# Patient Record
Sex: Female | Born: 1935 | Race: White | Hispanic: No | Smoking: Never smoker
Health system: Southern US, Community
[De-identification: ages and names within clinical notes are randomized; demographics above are authoritative.]

## PROBLEM LIST (undated history)

## (undated) DIAGNOSIS — K219 Gastro-esophageal reflux disease without esophagitis: Secondary | ICD-10-CM

## (undated) DIAGNOSIS — E7800 Pure hypercholesterolemia, unspecified: Secondary | ICD-10-CM

## (undated) DIAGNOSIS — H409 Unspecified glaucoma: Secondary | ICD-10-CM

## (undated) DIAGNOSIS — H269 Unspecified cataract: Secondary | ICD-10-CM

## (undated) DIAGNOSIS — T7840XA Allergy, unspecified, initial encounter: Secondary | ICD-10-CM

## (undated) DIAGNOSIS — M5136 Other intervertebral disc degeneration, lumbar region: Secondary | ICD-10-CM

## (undated) DIAGNOSIS — M1990 Unspecified osteoarthritis, unspecified site: Secondary | ICD-10-CM

## (undated) DIAGNOSIS — I441 Atrioventricular block, second degree: Secondary | ICD-10-CM

## (undated) DIAGNOSIS — G4700 Insomnia, unspecified: Secondary | ICD-10-CM

## (undated) DIAGNOSIS — B351 Tinea unguium: Secondary | ICD-10-CM

## (undated) DIAGNOSIS — K5730 Diverticulosis of large intestine without perforation or abscess without bleeding: Secondary | ICD-10-CM

## (undated) DIAGNOSIS — K5900 Constipation, unspecified: Secondary | ICD-10-CM

## (undated) DIAGNOSIS — G709 Myoneural disorder, unspecified: Secondary | ICD-10-CM

## (undated) DIAGNOSIS — I10 Essential (primary) hypertension: Secondary | ICD-10-CM

## (undated) DIAGNOSIS — M4800 Spinal stenosis, site unspecified: Secondary | ICD-10-CM

## (undated) DIAGNOSIS — Z9289 Personal history of other medical treatment: Secondary | ICD-10-CM

## (undated) DIAGNOSIS — M810 Age-related osteoporosis without current pathological fracture: Secondary | ICD-10-CM

## (undated) DIAGNOSIS — R0989 Other specified symptoms and signs involving the circulatory and respiratory systems: Secondary | ICD-10-CM

## (undated) DIAGNOSIS — I8390 Asymptomatic varicose veins of unspecified lower extremity: Secondary | ICD-10-CM

## (undated) DIAGNOSIS — E739 Lactose intolerance, unspecified: Secondary | ICD-10-CM

## (undated) DIAGNOSIS — E079 Disorder of thyroid, unspecified: Secondary | ICD-10-CM

## (undated) DIAGNOSIS — Z860101 Personal history of adenomatous and serrated colon polyps: Secondary | ICD-10-CM

## (undated) DIAGNOSIS — J439 Emphysema, unspecified: Secondary | ICD-10-CM

## (undated) DIAGNOSIS — F419 Anxiety disorder, unspecified: Secondary | ICD-10-CM

## (undated) DIAGNOSIS — Z86010 Personal history of colonic polyps: Secondary | ICD-10-CM

## (undated) DIAGNOSIS — M51369 Other intervertebral disc degeneration, lumbar region without mention of lumbar back pain or lower extremity pain: Secondary | ICD-10-CM

## (undated) DIAGNOSIS — E039 Hypothyroidism, unspecified: Secondary | ICD-10-CM

## (undated) HISTORY — PX: JOINT REPLACEMENT: SHX530

## (undated) HISTORY — DX: Disorder of thyroid, unspecified: E07.9

## (undated) HISTORY — DX: Emphysema, unspecified: J43.9

## (undated) HISTORY — DX: Personal history of adenomatous and serrated colon polyps: Z86.0101

## (undated) HISTORY — DX: Spinal stenosis, site unspecified: M48.00

## (undated) HISTORY — DX: Tinea unguium: B35.1

## (undated) HISTORY — PX: ABDOMINAL HYSTERECTOMY: SHX81

## (undated) HISTORY — DX: Constipation, unspecified: K59.00

## (undated) HISTORY — DX: Other intervertebral disc degeneration, lumbar region without mention of lumbar back pain or lower extremity pain: M51.369

## (undated) HISTORY — DX: Allergy, unspecified, initial encounter: T78.40XA

## (undated) HISTORY — DX: Gastro-esophageal reflux disease without esophagitis: K21.9

## (undated) HISTORY — DX: Essential (primary) hypertension: I10

## (undated) HISTORY — DX: Lactose intolerance, unspecified: E73.9

## (undated) HISTORY — PX: TOTAL HIP ARTHROPLASTY: SHX124

## (undated) HISTORY — DX: Atrioventricular block, second degree: I44.1

## (undated) HISTORY — PX: OTHER SURGICAL HISTORY: SHX169

## (undated) HISTORY — PX: INCONTINENCE SURGERY: SHX676

## (undated) HISTORY — DX: Myoneural disorder, unspecified: G70.9

## (undated) HISTORY — DX: Unspecified osteoarthritis, unspecified site: M19.90

## (undated) HISTORY — PX: CATARACT EXTRACTION W/ INTRAOCULAR LENS  IMPLANT, BILATERAL: SHX1307

## (undated) HISTORY — DX: Insomnia, unspecified: G47.00

## (undated) HISTORY — PX: CHOLECYSTECTOMY: SHX55

## (undated) HISTORY — DX: Personal history of other medical treatment: Z92.89

## (undated) HISTORY — DX: Unspecified cataract: H26.9

## (undated) HISTORY — DX: Personal history of colonic polyps: Z86.010

## (undated) HISTORY — DX: Anxiety disorder, unspecified: F41.9

## (undated) HISTORY — PX: TONSILLECTOMY: SUR1361

## (undated) HISTORY — DX: Unspecified glaucoma: H40.9

## (undated) HISTORY — DX: Other intervertebral disc degeneration, lumbar region: M51.36

## (undated) HISTORY — DX: Age-related osteoporosis without current pathological fracture: M81.0

## (undated) HISTORY — DX: Other specified symptoms and signs involving the circulatory and respiratory systems: R09.89

## (undated) HISTORY — DX: Diverticulosis of large intestine without perforation or abscess without bleeding: K57.30

## (undated) HISTORY — DX: Asymptomatic varicose veins of unspecified lower extremity: I83.90

## (undated) HISTORY — PX: BLADDER REPAIR: SHX76

---

## 1998-04-07 ENCOUNTER — Other Ambulatory Visit: Admission: RE | Admit: 1998-04-07 | Discharge: 1998-04-07 | Payer: Self-pay | Admitting: *Deleted

## 1999-05-13 ENCOUNTER — Other Ambulatory Visit: Admission: RE | Admit: 1999-05-13 | Discharge: 1999-05-13 | Payer: Self-pay | Admitting: *Deleted

## 1999-07-07 ENCOUNTER — Ambulatory Visit (HOSPITAL_COMMUNITY): Admission: RE | Admit: 1999-07-07 | Discharge: 1999-07-07 | Payer: Self-pay | Admitting: Gastroenterology

## 2000-05-31 ENCOUNTER — Encounter (INDEPENDENT_AMBULATORY_CARE_PROVIDER_SITE_OTHER): Payer: Self-pay | Admitting: Specialist

## 2000-05-31 ENCOUNTER — Other Ambulatory Visit: Admission: RE | Admit: 2000-05-31 | Discharge: 2000-05-31 | Payer: Self-pay | Admitting: Radiology

## 2000-08-22 ENCOUNTER — Emergency Department (HOSPITAL_COMMUNITY): Admission: EM | Admit: 2000-08-22 | Discharge: 2000-08-22 | Payer: Self-pay | Admitting: Emergency Medicine

## 2000-08-30 ENCOUNTER — Encounter: Admission: RE | Admit: 2000-08-30 | Discharge: 2000-08-30 | Payer: Self-pay | Admitting: Gastroenterology

## 2000-08-30 ENCOUNTER — Encounter: Payer: Self-pay | Admitting: Gastroenterology

## 2000-09-13 ENCOUNTER — Encounter: Payer: Self-pay | Admitting: General Surgery

## 2000-09-15 ENCOUNTER — Observation Stay (HOSPITAL_COMMUNITY): Admission: RE | Admit: 2000-09-15 | Discharge: 2000-09-16 | Payer: Self-pay | Admitting: General Surgery

## 2001-04-11 ENCOUNTER — Ambulatory Visit (HOSPITAL_COMMUNITY): Admission: RE | Admit: 2001-04-11 | Discharge: 2001-04-11 | Payer: Self-pay | Admitting: Gastroenterology

## 2002-03-19 ENCOUNTER — Encounter: Payer: Self-pay | Admitting: Urology

## 2002-03-23 ENCOUNTER — Inpatient Hospital Stay (HOSPITAL_COMMUNITY): Admission: RE | Admit: 2002-03-23 | Discharge: 2002-03-25 | Payer: Self-pay | Admitting: Urology

## 2003-03-30 DIAGNOSIS — R0989 Other specified symptoms and signs involving the circulatory and respiratory systems: Secondary | ICD-10-CM

## 2003-03-30 HISTORY — DX: Other specified symptoms and signs involving the circulatory and respiratory systems: R09.89

## 2003-04-16 ENCOUNTER — Encounter: Admission: RE | Admit: 2003-04-16 | Discharge: 2003-04-16 | Payer: Self-pay | Admitting: Gastroenterology

## 2003-04-16 ENCOUNTER — Encounter: Payer: Self-pay | Admitting: Gastroenterology

## 2003-05-21 ENCOUNTER — Encounter (INDEPENDENT_AMBULATORY_CARE_PROVIDER_SITE_OTHER): Payer: Self-pay | Admitting: Specialist

## 2003-05-21 ENCOUNTER — Ambulatory Visit (HOSPITAL_COMMUNITY): Admission: RE | Admit: 2003-05-21 | Discharge: 2003-05-21 | Payer: Self-pay | Admitting: Gastroenterology

## 2003-08-30 HISTORY — PX: SHOULDER ARTHROSCOPY WITH ROTATOR CUFF REPAIR: SHX5685

## 2003-09-08 ENCOUNTER — Encounter: Admission: RE | Admit: 2003-09-08 | Discharge: 2003-09-08 | Payer: Self-pay | Admitting: Orthopedic Surgery

## 2003-09-09 ENCOUNTER — Ambulatory Visit (HOSPITAL_BASED_OUTPATIENT_CLINIC_OR_DEPARTMENT_OTHER): Admission: RE | Admit: 2003-09-09 | Discharge: 2003-09-09 | Payer: Self-pay | Admitting: Orthopedic Surgery

## 2003-09-09 ENCOUNTER — Ambulatory Visit (HOSPITAL_COMMUNITY): Admission: RE | Admit: 2003-09-09 | Discharge: 2003-09-09 | Payer: Self-pay | Admitting: Orthopedic Surgery

## 2005-07-07 ENCOUNTER — Encounter: Admission: RE | Admit: 2005-07-07 | Discharge: 2005-07-07 | Payer: Self-pay | Admitting: Internal Medicine

## 2006-02-23 ENCOUNTER — Encounter: Admission: RE | Admit: 2006-02-23 | Discharge: 2006-02-23 | Payer: Self-pay | Admitting: Internal Medicine

## 2007-05-07 ENCOUNTER — Other Ambulatory Visit: Admission: RE | Admit: 2007-05-07 | Discharge: 2007-05-07 | Payer: Self-pay | Admitting: Internal Medicine

## 2010-09-18 ENCOUNTER — Encounter: Payer: Self-pay | Admitting: Internal Medicine

## 2010-12-29 ENCOUNTER — Other Ambulatory Visit (HOSPITAL_COMMUNITY): Payer: Self-pay | Admitting: Neurological Surgery

## 2010-12-29 DIAGNOSIS — M48061 Spinal stenosis, lumbar region without neurogenic claudication: Secondary | ICD-10-CM

## 2011-01-14 NOTE — Op Note (Signed)
[facility]  Patient:    Destiny [name], Destiny [name] Visit Number: [id number] MRN: [id number]          Service Type: SUR Location: 3W [id number] [id number] Attending Physician:  Londell Moh Dictated by:   Jamison Neighbor, M.D. Proc. Date: 03/22/02 Admit Date:  03/22/2002                             Operative Report  SERVICE:  Urology.  PREOPERATIVE DIAGNOSES: 1. Stress urinary incontinence. 2. Cystocele.  POSTOPERATIVE DIAGNOSES: 1. Stress urinary incontinence. 2. Cystocele.  PROCEDURE:  Transvaginal taping pubovaginal sling.  SURGEON:  Jamison Neighbor, M.D.  ANESTHESIA:  General.  COMPLICATIONS:  None.  DRAINS:  Foley catheter.  BRIEF HISTORY:  This 77 year old female has urinary incontinence felt to be primarily stress incontinence in nature but with a possible component of urge incontinence. She had been evaluated of the urodynamics which confirmed a low leak point pressure. The patient was thought to have a modest cystocele as well as a small rectocele. The rectocele was asymptomatic and the patient did not feel that she needed to have anything done for rectal symptoms. The patient does want to have the TVT procedure done. She has read about this in the lay press and now she presented to the office with the expressed interest in having this done. She was told that a sling was an optimal option for her but that she might require additional cystocele repair. She knows that if she has the cystocele she will likely have to stay and perhaps use a Cysto catheter, if she has just the TVT she should be able to go home as an outpatient. The patient gave full and informed consent.  DESCRIPTION OF PROCEDURE:  After successful induction of general anesthesia, the patient was placed in the dorsal lithotomy position and prepped with Betadine and draped in the usual sterile fashion. A vertical incision was made directly over the bladder neck. The patient had a  little bit more brisk bleeding than is normally seen and this was controlled with electrocautery. The flaps of mucosa were raised bilaterally extending back to the space of Retzius but not through the space of Retzius. The bladder was drained. The TVT needle guides were passed from the abdominal incision down to the vaginal incision. The cystoscope was inserted and the bladder was carefully inspected and was free of any tumor or stones. Both ureteral orifices were normal in configuration and location. There was no injury to the bladder. This was inspected with both 12 degree and 70 degree lenses. The TVT was then pulled up to the abdominal incision. The tension was set so that an 8 Hegar dilator would sit just underneath the urethra but no tighter. The protective wrapper for the sling was pulled away, the sling was cut at the skin level setting the tension. The incision was irrigated and closed with Vicryl sutures. The top incision was closed with 4-0 Vicryl and Steri-Strips. The patient tolerated the procedure well and was taken to the recovery room in good condition. Dictated by:   Jamison Neighbor, M.D. Attending Physician:  Londell Moh DD:  03/22/02 TD:  03/25/02 Job: [id number]

## 2011-01-14 NOTE — Op Note (Signed)
NAME:  Destiny [name], Destiny [name]                         ACCOUNT NO.:  [id number]   MEDICAL RECORD NO.:  [id number]                   PATIENT TYPE:  AMB   LOCATION:  DSC                                  FACILITY:  [facility]   PHYSICIAN:  Katy Fitch. Naaman Plummer., M.D.          DATE OF BIRTH:  1928/03/11   DATE OF PROCEDURE:  09/09/2003  DATE OF DISCHARGE:                                 OPERATIVE REPORT   PREOPERATIVE DIAGNOSES:  Large retracted three-tendon rotator cuff tear,  right shoulder, with evidence of significant synovitis and probable long  head of biceps rupture, also MRI evidence of long head of biceps rupture and  acromioclavicular arthropathy.   POSTOPERATIVE DIAGNOSES:  Large retracted three-tendon rotator cuff tear,  right shoulder, with evidence of significant synovitis and probable long  head of biceps rupture, also MRI evidence of long head of biceps rupture and  acromioclavicular arthropathy.   OPERATION:  1. Diagnostic arthroscopy, right glenohumeral joint, followed by extensive     debridement of synovitis, necrotic rotator cuff, and biceps proximal     stump, with documentation of moderate glenohumeral degenerative     arthritis.  2. Open reconstruction of three-tendon rotator cuff tear, including     subscapularis, supraspinatus, infraspinatus, with a small portion of the     teres minor.  3. Open resection of the distal clavicle.  4. Incidental subacromial decompression.   SURGEON:  Katy Fitch. Sypher, M.D.   ASSISTANT:  Marveen Reeks. Dasnoit, P.A.-C.   ANESTHESIA:  General endotracheal, supplemented by inter-Scalene block.   ANESTHESIOLOGIST:  Maren Beach, M.D.   INDICATIONS FOR PROCEDURE:  The patient is a 77 year old woman referred by  Dr. Soyla Murphy. Pharr for evaluation and management of a painful right  shoulder.  She has a history of increasing weakness of abduction, external  rotation, and pain when she sleeps on her shoulder at night.  The clinical  examination revealed signs of a probable substantial rotator cuff tear.  Plain films documented AC arthropathy, a type 2 acromion, and a high-riding  humeral head suggestive of a substantial rotator cuff tear.   INFORMED CONSENT:  After an informed consent, she is brought to the  operating room at this time, anticipating decompression, distal clavicle  resection, debridement as necessary and an open repair of her rotator cuff.   DESCRIPTION OF PROCEDURE:  The patient was brought to the operating room and  placed in the supine position upon the operating room table.  Following an  anesthesia consultation by Dr. Diamantina Monks, a Scalene block was placed,  followed by induction of general orotracheal anesthesia.  She was carefully  positioned in the beach chair position with the aid of torso and head  holders for a shoulder arthroscopy.  The entire right upper extremity and  forequarter were prepped with DuraPrep and draped with impervious  arthroscopy drapes.  The procedure commenced with the placement of the  arthroscope through  a standard posterior portal.  A diagnostic arthroscopy  revealed a massive mid-substance tear of the subscapularis, supraspinatus,  infraspinatus and a portion of the teres minor.  Photographic documentation  of the tendon rupture predicament was accomplished with a digital camera.  The long head of the biceps was ruptured with a small slip remaining within  the joint.  An anterior portal was created under direct vision, followed by  the use of a suction shaver to debride the free margins of the rotator cuff  tear, complete a synovectomy of the joint, debride the deep surface of the  teres minor and infraspinatus tear, and generally debride the glenohumeral  joint.  The arthroscopic equipment was then removed from the glenohumeral joint and  we proceeded directly to an open reconstruction of the massive tear.  An  incision was fashioned from the distal clavicle to the  anterior lateral  corner of the acromion.  The anterior third of the deltoid was taken down,  exposing the [facility] joint and the coracoacromial ligament.  The ligament was  released and the artery electrocauterized.  A massively retracted complex  degenerative rotator cuff tear was noted.  The deep surface of the  subscapularis was debrided, as was the margin of the supraspinatus,  infraspinatus and the teres minor.  The greater tuberosity was decorticated  with the power bur, followed by the placement of three 6.5 mm Biocortical  screw anchors, two to anchor the supraspinatus and infraspinatus corner, and  one to anchor the infraspinatus and teres minor.  The V-shaped split between  the supraspinatus and infraspinatus was closed with a series of baseball  sutures interrupted style of #2 fiber wire that were advanced laterally to  help secure the insertion point on the greater tuberosity.  The mattress  sutures of the Biocortical screw anchors were placed into the  subscapularis/supraspinatus junction with slight superior advancement of the  subscapularis.  The supraspinatus and infraspinatus, as well as the teres  minor were repaired to their anatomic positions at the greater tuberosity  with a total of six mattress sutures of #2 fiber wire, followed by the use  of two McLaughlin sutures of #2 fiber wire through bone to augment this  repair.  A very smooth repair with a low profile was achieved.  The anterior  acromion was leveled to a type 1 morphology with the oscillating saw,  followed by the use of a rongeur to remove the medial osteophyte at the [facility]  joint.  The wounds were subsequently irrigated with sterile saline, followed  by triple antibiotic solution.  The anterior third of the deltoid was then  repaired to the capsule of the [facility] joint, and to the anterior acromion with  mattress sutures of #2 fiber wire, followed by repair of the deltoid split with  mattress sutures of #0 Vicryl.  The  skin was repaired with subdermal  sutures of #3-0 Vicryl and intradermal #3-0 Prolene with Steri-Strips.  There were no apparent complications.  The patient tolerated the surgery and anesthesia well.  She was transferred  to the recovery room with stable vital signs.   DISPOSITION:  She will be discharged to the recovery care center for  observation of her vital signs and appropriate analgesics in the form of IV  PC  and morphine, IV and p.o. Dilaudid, as well as prophylactic antibiotic  in the form of IV Ancef 1 g q.8h.  Katy Fitch Naaman Plummer., M.D.    RVS/MEDQ  D:  09/09/2003  T:  09/09/2003  Job:  [id number]   cc:   Soyla Murphy. Renne Crigler, M.D.  [address]  [place]  [place] [address]  Fax: [phone]

## 2011-01-14 NOTE — Discharge Summary (Signed)
NAME:  Destiny [name], Destiny [name]                         ACCOUNT NO.:  [id number]   MEDICAL RECORD NO.:  [id number]                   PATIENT TYPE:  INP   LOCATION:  0366                                 FACILITY:  [facility]   PHYSICIAN:  Jamison Neighbor, M.D.               DATE OF BIRTH:  Aug 01, 1928   DATE OF ADMISSION:  03/22/2002  DATE OF DISCHARGE:  03/25/2002                                 DISCHARGE SUMMARY   ADMISSION DIAGNOSES:  1. Vaginal prolapse.  2. Stress incontinence.  3. Hypertension.  4. Esophageal reflux disease.   DISCHARGE DIAGNOSES:  1. Vaginal prolapse.  2. Stress incontinence.  3. Hypertension.  4. Esophageal reflux disease.   PRINCIPAL PROCEDURE:  Cystoscopy and placement of TVT pubovaginal sling.   HISTORY OF PRESENT ILLNESS:  This 77 year old female has stress urinary  incontinence and is felt to be a candidate for pubovaginal sling.  The  patient was brought to the operating room on March 22, 2002, in order to  undergo placement of the sling.  The patient's postoperative course is  remarkable for lightheadedness that had occurred when she tried to void.  With marked strain she developed some voiding per vagina and it was felt  that the patient would need to be admitted for 23-hour observation.  The  patient's daughter claimed that she had had bleeding problems in the past  but the patient herself denied.  For that reason, the patient was converted  from an outpatient procedure to an inpatient.   PAST MEDICAL HISTORY:  This is remarkable for hypertension . She says this  is often just a white-coat hypertension.  She has esophageal reflux disease  as well as marked problems with arthritis.  Aside from the problems  mentioned above, the patient is known to have problems with depression.   CURRENT MEDICATIONS:  1. Detrol LA 4 mg daily.  2. Vioxx 12.5 mg at night.  3. Protonix 40 mg daily.  4. Zantac 150 mg at night.  5. Lexapro 10 mg one half tablet daily.  6.  Klonopin 25 mg one half daily.  7. Diltiazem CD 240 mg daily.  8. Nasonex nasal spray two puffs each nostril.  9. Centrum Silver one daily.  10.      Carafate one b.i.d.  11.      Citrucel as needed.  12.      Milk of Magnesia as needed.   PAST SURGICAL HISTORY:  Right hip replacement, total hysterectomy and  laparoscopic cholecystectomy.   PHYSICAL EXAMINATION:  GENERAL APPEARANCE:  She is a well-developed, well-  nourished female in no acute distress.  As noted above, the patient is  somewhat lightheadedness following the surgery and it is thought due to the  straining, she may have had a vasovagal problem and for that reason will be  admitted for 23-hour observation.  VITAL SIGNS:  Temperature 97, pulse 84, respiratory rate 16,  blood pressure  132/70.   HOSPITAL COURSE:  The patient recovered from the vasovagal episode and feel  significantly better.  The patient was able to slowly begin to urinate on  her own and by the time of admission, she was felt to be ready for  discharge.  We kept the patient in the hospital until March 25, 2002, over  the weekend that I was not on call. The patient had a slight elevation of  temperature to 101.4 but this responded without any further therapy.  At the  time of discharge, temperature was 99, she was voiding without any  difficulty.  She had no more syncope.  The appetite is good.  The pain is  well controlled.  The patient may have had a slight retroperitoneal hematoma  that caused a rise in temperature.  The patient will return to my office in  follow-up.  She was sent home with pain medication and antibiotics and will  have routine follow-up.                                                 Jamison Neighbor, M.D.    RJE/MEDQ  D:  04/18/2002  T:  04/18/2002  Job:  [id number]   cc:   Soyla Murphy. Renne Crigler, M.D.  Fax: [phone]

## 2011-01-14 NOTE — Op Note (Signed)
NAME:  Destiny [name], Destiny [name]                         ACCOUNT NO.:  [id number]   MEDICAL RECORD NO.:  [id number]                   PATIENT TYPE:  AMB   LOCATION:  ENDO                                 [facility]ACILITY:  [facility]   PHYSICIAN:  Bernette Redbird, M.D.                DATE OF BIRTH:  05-26-1928   DATE OF PROCEDURE:  05/21/2003  DATE OF DISCHARGE:                                 OPERATIVE REPORT   PROCEDURE:  Upper endoscopy with biopsy.   INDICATIONS:  A 77 year old female with iron-deficiency anemia and hemoccult  negative stool.  A recent hemoglobin through her primary physician's office  showed a hemoglobin of 8.2, MCV 62, iron saturation 3%.   FINDINGS:  Essentially normal exam.   CONSENT:  The nature, purpose and risks of this procedure had been  previously been discussed with the patient who provided written consent.   SEDATION:  Fentanyl 75 mcg and Versed 7 mg IV without arrhythmias or  desaturation.   DESCRIPTION OF PROCEDURE:  The Olympus video endoscope was passed under  direct vision.  The vocal cords looked normal.  The esophagus was readily  entered.  The esophageal mucosa was unremarkable without evidence of reflux  esophagitis, Barrett's esophagus, varices, infection or neoplasia.  A  minimal 1 cm hiatal hernia was present.  The LES was somewhat patulous  consistent with her history of reflux disease.   The stomach contained no significant residual and had normal mucosa without  evidence of gastritis, erosions, ulcers, polyps, or masses.  In one or two  places in the stomach, there was a small, roughly 4 mm erythematous patch  conceivably representing a vascular malformation, although it did not have a  classic telangiectatic appearance.  The pylorus, duodenal bulb and second  duodenum looked normal.  Duodenal biopsies were obtained to help rule out  celiac disease as the possible source of iron-deficiency anemia with heme-  negative stools.   The scope was then  removed from the patient.  Note that a retroflexed view  of the cardia showed an intact diaphragmatic hiatus whereas on endoscopy  several years ago, it was felt to be somewhat patulous.   The patient tolerated the procedure well and there were no apparent  complications.    IMPRESSION:  Essentially normal endoscopy with no source of anemia  endoscopically evident.  May possibly have some small vascular ectasia but  these are felt to be of dubious clinical significance.   PLAN:  Await pathology and duodenal biopsies and proceed to colonoscopic  evaluation.                                               Bernette Redbird, M.D.    RB/MEDQ  D:  05/21/2003  T:  05/21/2003  Job:  [id number]  cc:   Lynnell Chad. Shelia Media, M.D.  [address] [place]  [place] [address]  Fax: [phone]

## 2011-01-14 NOTE — H&P (Signed)
   NAME:  Destiny [name], Destiny [name]                         ACCOUNT NO.:  [id number]   MEDICAL RECORD NO.:  [id number]                   PATIENT TYPE:  INP   LOCATION:  0366                                 FACILITY:  [facility]   PHYSICIAN:  Jamison Neighbor, M.D.               DATE OF BIRTH:  1928-01-16   DATE OF ADMISSION:  03/22/2002  DATE OF DISCHARGE:  03/25/2002                                HISTORY & PHYSICAL   HISTORY OF PRESENT ILLNESS:  This 77 year old female is scheduled to undergo  outpatient surgery for placement of pubovaginal sling in the ER,  in  postoperative period she had a vasovagal episode when she became very  lightheaded when trying to void and for that reason will be admitted for  further evaluation.   PAST MEDICAL HISTORY:  This is remarkable for hypertension and esophageal  reflux disease as well as her stress urinary incontinence.  She was also  noted to have problems with depression and arthritis.   PAST SURGICAL HISTORY:  Laparoscopic cholecystectomy, right hip replacement  and hysterectomy.   MEDICATIONS ON ADMISSION:  Tylenol, Detrol LA, Vioxx, Protonix, Zantac,  Lexapro, Klonopin, Diltiazem, Nasonex, Centrum Silver, Caltrate, Citracal  and Milk of Magnesia.   SOCIAL HISTORY:  This is unremarkable.  She does not use tobacco or alcohol.   FAMILY HISTORY:  Noncontributory.   PHYSICAL EXAMINATION:  GENERAL APPEARANCE:  This is a well-developed, well-  nourished female in no acute distress aside from the fact that she has  become lightheaded in the postoperative period.  VITAL SIGNS:  Temperature 97, pulse 84, respiratory rate 16, blood pressure  132/70.  HEENT:  Normocephalic and atraumatic.  NEUROLOGIC:  Cranial nerves 2-12  grossly intact.  NECK:  Supple, no adenopathy or thyromegaly.  LUNGS:  Clear.  CARDIOVASCULAR:  Regular rate and rhythm without murmurs, thrills, gallops,  rubs or heaves.  ABDOMEN:  Soft and nontender with no palpable masses, rebound or  guarding.  GU:  There is no sign of excessive bleeding following the procedure. There  is modest vaginal drainage after straining and for that reason a pack will  be replaced.  EXTREMITIES:  No clubbing, cyanosis, or edema.    IMPRESSION:  Syncopal episode following routine pubovaginal sling.   PLAN:  Admit for additional observation due to vasovagal event.                                                Jamison Neighbor, M.D.    RJE/MEDQ  D:  04/18/2002  T:  04/18/2002  Job:  [id number]   cc:   Soyla Murphy. Renne Crigler, M.D.  Fax: [phone]

## 2011-01-14 NOTE — Procedures (Signed)
[facility]  Patient:    Destiny [name], Destiny [name]                      MRN: [id number] Proc. Date: 04/11/01 Adm. Date:  [id number] Attending:  Rich Brave                           Procedure Report  PROCEDURE PERFORMED:  Colonoscopy.  ENDOSCOPIST:  Florencia Reasons, M.D.  INDICATIONS:  Screening for colon cancer in this 77 year old female.  FINDINGS:  Normal exam to the cecum.  DESCRIPTION OF PROCEDURE:  The nature and purpose of the procedure has been discussed with the patient and she provided written consent.  Sedation is fentanyl 87.5 mcg and Versed 7 mg IV without arrhythmias or desaturation.  The Olympus pediatric video colonoscope was easily advanced to the cecum, and pull back was then performed.  The quality of the prep was excellent and it is felt that all areas are well seen.  This was a normal examination.  No polyps, cancer, colitis, vascular malformations, or diverticulosis were noted.  Retroflexion of the rectum was normal.  No biopsies were obtained.  The patient tolerated the procedure well and there were no apparent complications.  IMPRESSION:  Normal colonoscopy.  PLAN:  Consider screening flexible sigmoidoscopy in five years and full colonoscopy again in 10 years depending on the patients general medical health. DD:  04/11/01 TD:  04/11/01 Job: [id number]

## 2011-01-14 NOTE — Op Note (Signed)
NAME:  Destiny [name], Destiny [name]                         ACCOUNT NO.:  [id number]   MEDICAL RECORD NO.:  [id number]                   PATIENT TYPE:  AMB   LOCATION:  ENDO                                 FACILITY:  [facility]   PHYSICIAN:  Bernette Redbird, M.D.                DATE OF BIRTH:  Jan 04, 1928   DATE OF PROCEDURE:  05/21/2003  DATE OF DISCHARGE:                                 OPERATIVE REPORT   PROCEDURE:  Colonoscopy.   INDICATIONS:  This is a 77 year old female for evaluation of iron-deficiency  anemia noted incidentally on recent blood work through her primary  physician's office where a hemoglobin was 8.2 and iron saturation was 3%.  She was hemoccult negative when I checked her in the office.  Her upper  endoscopy was unrevealing.   FINDINGS:  1. Diminutive sessile polyp at 20 cm removed.  2. Minimal diverticulosis.   CONSENT:  The patient provided written consent for the procedure.   SEDATION:  Sedation for this procedure and the upper endoscopy which  immediately preceded it totaled Fentanyl 100 mcg and Versed 9 mg IV without  arrhythmias or desaturation.   DESCRIPTION OF PROCEDURE:  The Olympus adjustable tension pediatric video  colonoscope was advanced to the terminal ileum without too much difficulty,  using a little bit of external abdominal compression and controlled looping.  The terminal ileum has a normal appearance and pull-back was then performed.  There was a little bit of adherent stool film in the proximal ascending  colon and cecum but it was felt that no significant lesions would have been  missed.  This was essentially a normal exam.  There was a 2-3 mm sessile  polyp removed by a single cold biopsy at about 20 cm.  There was some  minimal left-sided diverticulosis.  No large polyps, cancer, colitis, or  vascular malformations were noted and there was no extensive diverticular  change.  Retroflexion of the rectum and reinspection of the rectosigmoid was  unremarkable.   The patient tolerated the procedure well and there were no apparent  complications.   IMPRESSION:  1. No source of iron-deficiency anemia endoscopically evident.  2. Minimal diverticulosis.  3. Solitary diminutive sessile polyp removed   RECOMMENDATIONS:  Await pathology.  Consider home hemoccults and capsule  endoscopy if the hemoccults are positive.   It is conceivable that this patient's iron-deficiency anemia is arising from  chronic PPI therapy.  Since PPI's do inhibit iron absorption to some degree.  The other possibility is that the patient is having intermittent ulceration  from aspirin and Vioxx exposure, although there was certainly no evidence of  aspirin-induced gastropathy on the patient's endoscopy today.  Bernette Redbird, M.D.    RB/MEDQ  D:  05/21/2003  T:  05/21/2003  Job:  [id number]   cc:   Soyla Murphy. Renne Crigler, M.D.  [address]  [place]  [place] [address]  Fax: [phone]

## 2011-01-14 NOTE — Op Note (Signed)
[facility]  Patient:    Destiny [name], Destiny [name]                      MRN: [id number] Proc. Date: 09/15/00 Adm. Date:  [id number] Disc. Date: [id number] Attending:  Glenna Fellows Tappan                           Operative Report  PREOPERATIVE DIAGNOSIS:  Symptomatic cholelithiasis.  POSTOPERATIVE DIAGNOSES:  Symptomatic cholelithiasis.  PROCEDURE:  Laparoscopic cholecystectomy.  SURGEON:  Dr. Johna Sheriff.  ASSISTANT:  Dr. Claud Kelp.  BRIEF HISTORY:  [name] is a 77 year old white female who presents with repeated episodes of epigastric and right upper quadrant pain. Gallbladder ultrasound has shown multiple stones. The common bile duct is normal and LFTs are normal. Laparoscopic cholecystectomy for relief of symptoms and prevention of complications has been recommended and accepted. The nature of the procedure, its indications, risks of bleeding, infection, bile leak and bile duct injury were discussed and understood preoperatively. She is now brought to the operating room for this procedure.  DESCRIPTION OF PROCEDURE:  The patient is brought to the operating room, placed in supine position on the operating table and general endotracheal anesthesia was induced. Broad spectrum antibiotics were given intravenously. The abdomen was sterilely prepped and draped. Local anesthesia was used to infiltrate the trocar sites prior to the incisions. A 1 cm incision was made at the umbilicus and dissection carried down the midline fascia. It was sharply incised for 1 cm and the peritoneum entered under direct vision. A Hasson trocar was placed through a pursestring suture of #0 Vicryl. Pneumoperitoneum was established. Under direct vision, a 10 mm trocar was placed in the subxiphoid area and two 5 mm trocars along the right subcostal margin. The gallbladder was visualized and was not acutely inflamed. There were some omental and fibrofatty adhesions up onto  the gallbladder and the liver next to the gallbladder and these were taken down carefully with cautery and scissors. The infundibulum was exposed and retracted inferolaterally. Fibrofatty tissue was stripped down off the neck of the gallbladder toward the porta hepatis. The cystic artery was identified at Calots triangle coursing up on the gallbladder wall and it was dissected free and anterior and posterior branches individually clipped and divided. The distal gallbladder was then thoroughly dissected 360 degrees and the cystic duct gallbladder junction clearly identified. When the anatomy was clear, the cystic duct was doubly clipped proximally, clipped distally and divided. The gallbladder was then dissected free from its bed using hook cautery and removed intact through the umbilicus. Complete hemostasis was assured in the operative site. Trocars removed under direct vision, all CO2 evacuated from the peritoneal cavity. The pursestring suture was secured to the umbilicus. The skin incisions were closed with subcuticular 4-0 monocryl and Dermabond. Sponge, needle and instrument counts were correct. The patient was taken to the recovery room in good condition having tolerated the procedure well. DD:  09/15/00 TD:  09/16/00 Job: [id number]

## 2011-01-19 ENCOUNTER — Ambulatory Visit (HOSPITAL_COMMUNITY)
Admission: RE | Admit: 2011-01-19 | Discharge: 2011-01-19 | Disposition: A | Discharge status: Home / Self Care | Payer: Medicare Other | Source: Ambulatory Visit | Attending: Neurological Surgery | Admitting: Neurological Surgery

## 2011-01-19 DIAGNOSIS — M48061 Spinal stenosis, lumbar region without neurogenic claudication: Secondary | ICD-10-CM

## 2011-01-19 DIAGNOSIS — M47817 Spondylosis without myelopathy or radiculopathy, lumbosacral region: Secondary | ICD-10-CM | POA: Insufficient documentation

## 2011-01-19 MED ORDER — IOHEXOL 180 MG/ML  SOLN
20.0000 mL | Freq: Once | INTRAMUSCULAR | Status: AC | PRN
  Administered 2011-01-19: 20 mL via INTRATHECAL

## 2011-02-27 HISTORY — PX: LAMINECTOMY: SHX219

## 2011-03-08 ENCOUNTER — Encounter (HOSPITAL_COMMUNITY)
Admission: RE | Admit: 2011-03-08 | Discharge: 2011-03-08 | Disposition: A | Discharge status: Home / Self Care | Payer: Medicare Other | Source: Ambulatory Visit | Attending: Neurological Surgery | Admitting: Neurological Surgery

## 2011-03-08 ENCOUNTER — Ambulatory Visit (HOSPITAL_COMMUNITY)
Admission: RE | Admit: 2011-03-08 | Discharge: 2011-03-08 | Disposition: A | Discharge status: Home / Self Care | Payer: Medicare Other | Source: Ambulatory Visit | Attending: Neurological Surgery | Admitting: Neurological Surgery

## 2011-03-08 ENCOUNTER — Other Ambulatory Visit (HOSPITAL_COMMUNITY): Payer: Self-pay | Admitting: Neurological Surgery

## 2011-03-08 DIAGNOSIS — M479 Spondylosis, unspecified: Secondary | ICD-10-CM

## 2011-03-08 DIAGNOSIS — Z01811 Encounter for preprocedural respiratory examination: Secondary | ICD-10-CM | POA: Insufficient documentation

## 2011-03-08 DIAGNOSIS — M4807 Spinal stenosis, lumbosacral region: Secondary | ICD-10-CM

## 2011-03-08 DIAGNOSIS — M48061 Spinal stenosis, lumbar region without neurogenic claudication: Secondary | ICD-10-CM | POA: Insufficient documentation

## 2011-03-08 DIAGNOSIS — Z01810 Encounter for preprocedural cardiovascular examination: Secondary | ICD-10-CM | POA: Insufficient documentation

## 2011-03-08 DIAGNOSIS — Z01812 Encounter for preprocedural laboratory examination: Secondary | ICD-10-CM | POA: Insufficient documentation

## 2011-03-08 DIAGNOSIS — M47817 Spondylosis without myelopathy or radiculopathy, lumbosacral region: Secondary | ICD-10-CM | POA: Insufficient documentation

## 2011-03-08 LAB — BASIC METABOLIC PANEL
BUN: 17 mg/dL (ref 6–23)
GFR calc non Af Amer: >60 mL/min (ref >60)
Glucose, Bld: 82 mg/dL (ref 70–99)
Potassium: 4.6 mEq/L (ref 3.5–5.1)

## 2011-03-08 LAB — CBC
HCT: 42.2 % (ref 36.0–46.0)
Hemoglobin: 14.1 g/dL (ref 12.0–15.0)
MCH: 30.6 pg (ref 26.0–34.0)
MCHC: 33.4 g/dL (ref 30.0–36.0)
MCV: 91.5 fL (ref 78.0–100.0)

## 2011-03-10 ENCOUNTER — Inpatient Hospital Stay (HOSPITAL_COMMUNITY)
Admission: RE | Admit: 2011-03-10 | Discharge: 2011-03-11 | DRG: 491 | Disposition: A | Discharge status: Home / Self Care | Payer: Medicare Other | Source: Ambulatory Visit | Attending: Neurological Surgery | Admitting: Neurological Surgery

## 2011-03-10 ENCOUNTER — Inpatient Hospital Stay (HOSPITAL_COMMUNITY): Payer: Medicare Other

## 2011-03-10 DIAGNOSIS — M47817 Spondylosis without myelopathy or radiculopathy, lumbosacral region: Principal | ICD-10-CM | POA: Diagnosis present

## 2011-03-10 DIAGNOSIS — M48061 Spinal stenosis, lumbar region without neurogenic claudication: Secondary | ICD-10-CM | POA: Diagnosis present

## 2011-04-04 NOTE — Op Note (Signed)
NAMEMIARA, Destiny [name] NO.:  [id number]  MEDICAL RECORD NO.:  [id number]  LOCATION:  3526                         FACILITY:  [facility]  PHYSICIAN:  Stefani Dama, M.D.  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  PROCEDURE:  Bilateral laminotomies and decompression of the L3, L4, and L5 nerve roots with laminotomies at L3-4 and L4-5, use of the operating microscope and microdissection technique.  SURGEON:  Stefani Dama, MD  FIRST ASSISTANT:  Cristi Loron, MD  ANESTHESIA:  General endotracheal.  INDICATIONS:  The patient is an 77 year old individual who has had significant back and bilateral lower extremity pain.  She has evidence of spondylitic stenosis and she has severe spondylolisthesis also noted. Nonetheless, because her spine is relatively rigid and her advanced age, I discussed with her performance of a microendoscopic diskectomy and she is taken to the operating room for this procedure.  DESCRIPTION OF PROCEDURE:  The patient was brought to the operating room supine on the stretcher.  After smooth induction of general endotracheal anesthesia, she was turned prone.  The back was prepped with alcohol and DuraPrep, draped in sterile fashion.  After fluoroscopic localization of L3-L4, a vertical incision was created in the back and dissection was carried down through the lumbodorsal fascia and the paraspinous musculature and a subperiosteal fashion.  Self-retaining retractor was placed in the wound and the lamina of L3 was then identified. Laminotomies were created removing inferior margin lamina of L3 out to the medial  wall facet.  Partial facetectomy was performed.  High-speed drill was used to thin the lamina and curettes and rongeurs were used to remove severely redundant ligament in this region.  Once common dural tube was identified, care was taken and decompressed the L3 nerve root superiorly with a curved and angled Kerrison punch and a series of curved and angled curettes.  Then carefully hemostasis was achieved at each step of the way.  The L4 nerve root was then similarly decompressed via the inferior marginal with laminotomy.  The opposite side then underwent similar decompression using a similar tools.  Next, we extended incision down slightly to expose L4-L5 interspace and laminotomies were created here using high-speed drill to remove the outer border of the lamina and the medial border of the facet joint. With this area being completed, the ligamentum and central canal were well decompressed and the L4 and L5 nerve roots were well decompressed. Once this was verified, attention was turned to making sure that there was adequate hemostasis throughout the wound and each of nerves were individually decompressed and sounded freely out into the lateral recesses.  This portion of the operation was done under the operating microscope with both Dr. Lovell Sheehan and myself working to secure the decompression of the L3, L4, and L5 nerve roots bilaterally.  Once this was completed, the scope was removed and the lumbodorsal fascia was closed  with #1 Vicryl in interrupted fashion, 2-0 Vicryl used in subcutaneous tissues, 3-0 Vicryl subcuticularly and Dermabond was used on the skin.  Blood loss was estimated less than 100 mL and the patient tolerated procedure well and was returned to recovery room in stable condition.     Stefani Dama, M.D.     Destiny [name]  D:  03/10/2011  T:  03/11/2011  Job:  [id number]  Electronically Signed by Barnett Abu M.D. on 04/04/2011 07:31:16 AM

## 2011-06-02 ENCOUNTER — Emergency Department (HOSPITAL_COMMUNITY)
Admission: EM | Admit: 2011-06-02 | Discharge: 2011-06-03 | Disposition: A | Discharge status: Home / Self Care | Payer: Medicare Other | Attending: Emergency Medicine | Admitting: Emergency Medicine

## 2011-06-02 DIAGNOSIS — E7800 Pure hypercholesterolemia, unspecified: Secondary | ICD-10-CM | POA: Insufficient documentation

## 2011-06-02 DIAGNOSIS — R109 Unspecified abdominal pain: Secondary | ICD-10-CM | POA: Insufficient documentation

## 2011-06-02 DIAGNOSIS — Z96649 Presence of unspecified artificial hip joint: Secondary | ICD-10-CM | POA: Insufficient documentation

## 2011-06-02 DIAGNOSIS — K5900 Constipation, unspecified: Secondary | ICD-10-CM | POA: Insufficient documentation

## 2011-06-02 DIAGNOSIS — E039 Hypothyroidism, unspecified: Secondary | ICD-10-CM | POA: Insufficient documentation

## 2011-06-02 DIAGNOSIS — I10 Essential (primary) hypertension: Secondary | ICD-10-CM | POA: Insufficient documentation

## 2011-06-02 DIAGNOSIS — K219 Gastro-esophageal reflux disease without esophagitis: Secondary | ICD-10-CM | POA: Insufficient documentation

## 2011-06-03 ENCOUNTER — Emergency Department (HOSPITAL_COMMUNITY): Payer: Medicare Other

## 2011-06-03 LAB — URINE MICROSCOPIC-ADD ON

## 2011-06-03 LAB — POCT I-STAT, CHEM 8
BUN: 18 mg/dL (ref 6–23)
Creatinine, Ser: 0.7 mg/dL (ref 0.50–1.10)
Sodium: 142 mEq/L (ref 135–145)
TCO2: 22 mmol/L (ref 0–100)

## 2011-06-03 LAB — DIFFERENTIAL
Basophils Relative: 1 % (ref 0–1)
Lymphocytes Relative: 31 % (ref 12–46)
Monocytes Absolute: 0.5 10*3/uL (ref 0.1–1.0)
Monocytes Relative: 8 % (ref 3–12)
Neutro Abs: 3.7 10*3/uL (ref 1.7–7.7)

## 2011-06-03 LAB — URINALYSIS, ROUTINE W REFLEX MICROSCOPIC
Ketones, ur: NEGATIVE mg/dL
Nitrite: NEGATIVE
Protein, ur: NEGATIVE mg/dL

## 2011-06-03 LAB — CBC
HCT: 42.2 % (ref 36.0–46.0)
Hemoglobin: 13.7 g/dL (ref 12.0–15.0)
MCH: 30.2 pg (ref 26.0–34.0)
MCHC: 32.5 g/dL (ref 30.0–36.0)

## 2011-06-03 LAB — HEPATIC FUNCTION PANEL
Alkaline Phosphatase: 82 U/L (ref 39–117)
Indirect Bilirubin: 0.2 mg/dL — ABNORMAL LOW (ref 0.3–0.9)
Total Bilirubin: 0.3 mg/dL (ref 0.3–1.2)

## 2011-06-04 LAB — URINE CULTURE: Culture  Setup Time: 201210050426

## 2011-08-30 LAB — HM COLONOSCOPY: HM COLON: NORMAL

## 2011-08-31 DIAGNOSIS — H02409 Unspecified ptosis of unspecified eyelid: Secondary | ICD-10-CM | POA: Diagnosis not present

## 2011-08-31 DIAGNOSIS — H4010X0 Unspecified open-angle glaucoma, stage unspecified: Secondary | ICD-10-CM | POA: Diagnosis not present

## 2011-09-01 DIAGNOSIS — M5412 Radiculopathy, cervical region: Secondary | ICD-10-CM | POA: Diagnosis not present

## 2011-09-01 DIAGNOSIS — M542 Cervicalgia: Secondary | ICD-10-CM | POA: Diagnosis not present

## 2011-09-07 DIAGNOSIS — M5412 Radiculopathy, cervical region: Secondary | ICD-10-CM | POA: Diagnosis not present

## 2011-09-07 DIAGNOSIS — M542 Cervicalgia: Secondary | ICD-10-CM | POA: Diagnosis not present

## 2011-09-07 DIAGNOSIS — M545 Low back pain: Secondary | ICD-10-CM | POA: Diagnosis not present

## 2011-09-07 DIAGNOSIS — IMO0002 Reserved for concepts with insufficient information to code with codable children: Secondary | ICD-10-CM | POA: Diagnosis not present

## 2011-09-08 DIAGNOSIS — M5412 Radiculopathy, cervical region: Secondary | ICD-10-CM | POA: Diagnosis not present

## 2011-09-08 DIAGNOSIS — M542 Cervicalgia: Secondary | ICD-10-CM | POA: Diagnosis not present

## 2011-09-15 DIAGNOSIS — I10 Essential (primary) hypertension: Secondary | ICD-10-CM | POA: Diagnosis not present

## 2011-09-16 DIAGNOSIS — M5412 Radiculopathy, cervical region: Secondary | ICD-10-CM | POA: Diagnosis not present

## 2011-09-16 DIAGNOSIS — M542 Cervicalgia: Secondary | ICD-10-CM | POA: Diagnosis not present

## 2011-09-19 DIAGNOSIS — M542 Cervicalgia: Secondary | ICD-10-CM | POA: Diagnosis not present

## 2011-09-19 DIAGNOSIS — IMO0002 Reserved for concepts with insufficient information to code with codable children: Secondary | ICD-10-CM | POA: Diagnosis not present

## 2011-09-19 DIAGNOSIS — M5412 Radiculopathy, cervical region: Secondary | ICD-10-CM | POA: Diagnosis not present

## 2011-09-19 DIAGNOSIS — M545 Low back pain: Secondary | ICD-10-CM | POA: Diagnosis not present

## 2011-09-26 DIAGNOSIS — M545 Low back pain: Secondary | ICD-10-CM | POA: Diagnosis not present

## 2011-09-26 DIAGNOSIS — M542 Cervicalgia: Secondary | ICD-10-CM | POA: Diagnosis not present

## 2011-09-26 DIAGNOSIS — M5412 Radiculopathy, cervical region: Secondary | ICD-10-CM | POA: Diagnosis not present

## 2011-09-26 DIAGNOSIS — IMO0002 Reserved for concepts with insufficient information to code with codable children: Secondary | ICD-10-CM | POA: Diagnosis not present

## 2011-10-18 DIAGNOSIS — M545 Low back pain: Secondary | ICD-10-CM | POA: Diagnosis not present

## 2011-10-18 DIAGNOSIS — M5412 Radiculopathy, cervical region: Secondary | ICD-10-CM | POA: Diagnosis not present

## 2011-10-18 DIAGNOSIS — IMO0002 Reserved for concepts with insufficient information to code with codable children: Secondary | ICD-10-CM | POA: Diagnosis not present

## 2011-10-18 DIAGNOSIS — M542 Cervicalgia: Secondary | ICD-10-CM | POA: Diagnosis not present

## 2011-10-25 DIAGNOSIS — M545 Low back pain: Secondary | ICD-10-CM | POA: Diagnosis not present

## 2011-10-25 DIAGNOSIS — M5412 Radiculopathy, cervical region: Secondary | ICD-10-CM | POA: Diagnosis not present

## 2011-10-25 DIAGNOSIS — IMO0002 Reserved for concepts with insufficient information to code with codable children: Secondary | ICD-10-CM | POA: Diagnosis not present

## 2011-10-25 DIAGNOSIS — M542 Cervicalgia: Secondary | ICD-10-CM | POA: Diagnosis not present

## 2011-10-28 DIAGNOSIS — M542 Cervicalgia: Secondary | ICD-10-CM | POA: Diagnosis not present

## 2011-10-28 DIAGNOSIS — M5412 Radiculopathy, cervical region: Secondary | ICD-10-CM | POA: Diagnosis not present

## 2011-10-28 DIAGNOSIS — IMO0002 Reserved for concepts with insufficient information to code with codable children: Secondary | ICD-10-CM | POA: Diagnosis not present

## 2011-10-28 DIAGNOSIS — M545 Low back pain: Secondary | ICD-10-CM | POA: Diagnosis not present

## 2011-11-10 DIAGNOSIS — L723 Sebaceous cyst: Secondary | ICD-10-CM | POA: Diagnosis not present

## 2011-11-10 DIAGNOSIS — Z85828 Personal history of other malignant neoplasm of skin: Secondary | ICD-10-CM | POA: Diagnosis not present

## 2011-11-10 DIAGNOSIS — D485 Neoplasm of uncertain behavior of skin: Secondary | ICD-10-CM | POA: Diagnosis not present

## 2011-11-10 DIAGNOSIS — D239 Other benign neoplasm of skin, unspecified: Secondary | ICD-10-CM | POA: Diagnosis not present

## 2011-11-10 DIAGNOSIS — L821 Other seborrheic keratosis: Secondary | ICD-10-CM | POA: Diagnosis not present

## 2011-11-14 DIAGNOSIS — I10 Essential (primary) hypertension: Secondary | ICD-10-CM | POA: Diagnosis not present

## 2011-11-14 DIAGNOSIS — J309 Allergic rhinitis, unspecified: Secondary | ICD-10-CM | POA: Diagnosis not present

## 2011-12-08 DIAGNOSIS — J019 Acute sinusitis, unspecified: Secondary | ICD-10-CM | POA: Diagnosis not present

## 2011-12-30 DIAGNOSIS — H5340 Unspecified visual field defects: Secondary | ICD-10-CM | POA: Diagnosis not present

## 2011-12-30 DIAGNOSIS — H4011X0 Primary open-angle glaucoma, stage unspecified: Secondary | ICD-10-CM | POA: Diagnosis not present

## 2011-12-30 DIAGNOSIS — H409 Unspecified glaucoma: Secondary | ICD-10-CM | POA: Diagnosis not present

## 2011-12-30 DIAGNOSIS — H259 Unspecified age-related cataract: Secondary | ICD-10-CM | POA: Diagnosis not present

## 2012-02-20 DIAGNOSIS — R141 Gas pain: Secondary | ICD-10-CM | POA: Diagnosis not present

## 2012-02-20 DIAGNOSIS — R1032 Left lower quadrant pain: Secondary | ICD-10-CM | POA: Diagnosis not present

## 2012-02-20 DIAGNOSIS — Z86010 Personal history of colonic polyps: Secondary | ICD-10-CM | POA: Diagnosis not present

## 2012-04-03 DIAGNOSIS — L089 Local infection of the skin and subcutaneous tissue, unspecified: Secondary | ICD-10-CM | POA: Diagnosis not present

## 2012-04-23 DIAGNOSIS — Z86010 Personal history of colonic polyps: Secondary | ICD-10-CM | POA: Diagnosis not present

## 2012-04-23 DIAGNOSIS — R1032 Left lower quadrant pain: Secondary | ICD-10-CM | POA: Diagnosis not present

## 2012-04-23 DIAGNOSIS — K589 Irritable bowel syndrome without diarrhea: Secondary | ICD-10-CM | POA: Diagnosis not present

## 2012-05-09 DIAGNOSIS — H4011X0 Primary open-angle glaucoma, stage unspecified: Secondary | ICD-10-CM | POA: Diagnosis not present

## 2012-05-09 DIAGNOSIS — H259 Unspecified age-related cataract: Secondary | ICD-10-CM | POA: Diagnosis not present

## 2012-05-18 DIAGNOSIS — Z86010 Personal history of colonic polyps: Secondary | ICD-10-CM | POA: Diagnosis not present

## 2012-05-18 DIAGNOSIS — Z09 Encounter for follow-up examination after completed treatment for conditions other than malignant neoplasm: Secondary | ICD-10-CM | POA: Diagnosis not present

## 2012-05-18 DIAGNOSIS — K5730 Diverticulosis of large intestine without perforation or abscess without bleeding: Secondary | ICD-10-CM | POA: Diagnosis not present

## 2012-05-18 HISTORY — PX: COLONOSCOPY: SHX174

## 2012-06-07 DIAGNOSIS — H409 Unspecified glaucoma: Secondary | ICD-10-CM | POA: Diagnosis not present

## 2012-06-07 DIAGNOSIS — H4011X0 Primary open-angle glaucoma, stage unspecified: Secondary | ICD-10-CM | POA: Diagnosis not present

## 2012-06-07 DIAGNOSIS — H2510 Age-related nuclear cataract, unspecified eye: Secondary | ICD-10-CM | POA: Diagnosis not present

## 2012-06-14 DIAGNOSIS — E039 Hypothyroidism, unspecified: Secondary | ICD-10-CM | POA: Diagnosis not present

## 2012-06-14 DIAGNOSIS — I10 Essential (primary) hypertension: Secondary | ICD-10-CM | POA: Diagnosis not present

## 2012-06-14 DIAGNOSIS — N390 Urinary tract infection, site not specified: Secondary | ICD-10-CM | POA: Diagnosis not present

## 2012-06-14 DIAGNOSIS — E7800 Pure hypercholesterolemia, unspecified: Secondary | ICD-10-CM | POA: Diagnosis not present

## 2012-06-19 DIAGNOSIS — M899 Disorder of bone, unspecified: Secondary | ICD-10-CM | POA: Diagnosis not present

## 2012-06-19 DIAGNOSIS — M949 Disorder of cartilage, unspecified: Secondary | ICD-10-CM | POA: Diagnosis not present

## 2012-06-19 DIAGNOSIS — I10 Essential (primary) hypertension: Secondary | ICD-10-CM | POA: Diagnosis not present

## 2012-06-19 DIAGNOSIS — Z23 Encounter for immunization: Secondary | ICD-10-CM | POA: Diagnosis not present

## 2012-06-19 DIAGNOSIS — J309 Allergic rhinitis, unspecified: Secondary | ICD-10-CM | POA: Diagnosis not present

## 2012-06-19 DIAGNOSIS — A4902 Methicillin resistant Staphylococcus aureus infection, unspecified site: Secondary | ICD-10-CM | POA: Diagnosis not present

## 2012-06-19 DIAGNOSIS — Z0000 Encounter for general adult medical examination without abnormal findings: Secondary | ICD-10-CM | POA: Diagnosis not present

## 2012-06-21 DIAGNOSIS — Z1231 Encounter for screening mammogram for malignant neoplasm of breast: Secondary | ICD-10-CM | POA: Diagnosis not present

## 2012-06-28 DIAGNOSIS — N6489 Other specified disorders of breast: Secondary | ICD-10-CM | POA: Diagnosis not present

## 2012-07-30 DIAGNOSIS — M899 Disorder of bone, unspecified: Secondary | ICD-10-CM | POA: Diagnosis not present

## 2012-07-30 DIAGNOSIS — M810 Age-related osteoporosis without current pathological fracture: Secondary | ICD-10-CM | POA: Diagnosis not present

## 2012-08-10 DIAGNOSIS — I69998 Other sequelae following unspecified cerebrovascular disease: Secondary | ICD-10-CM | POA: Diagnosis not present

## 2012-08-16 DIAGNOSIS — I69998 Other sequelae following unspecified cerebrovascular disease: Secondary | ICD-10-CM | POA: Diagnosis not present

## 2012-08-16 DIAGNOSIS — R42 Dizziness and giddiness: Secondary | ICD-10-CM | POA: Diagnosis not present

## 2012-08-28 DIAGNOSIS — I69998 Other sequelae following unspecified cerebrovascular disease: Secondary | ICD-10-CM | POA: Diagnosis not present

## 2012-09-04 DIAGNOSIS — I69998 Other sequelae following unspecified cerebrovascular disease: Secondary | ICD-10-CM | POA: Diagnosis not present

## 2012-09-04 DIAGNOSIS — R42 Dizziness and giddiness: Secondary | ICD-10-CM | POA: Diagnosis not present

## 2012-09-06 DIAGNOSIS — M810 Age-related osteoporosis without current pathological fracture: Secondary | ICD-10-CM | POA: Diagnosis not present

## 2012-09-11 DIAGNOSIS — I69998 Other sequelae following unspecified cerebrovascular disease: Secondary | ICD-10-CM | POA: Diagnosis not present

## 2012-10-02 DIAGNOSIS — J309 Allergic rhinitis, unspecified: Secondary | ICD-10-CM | POA: Diagnosis not present

## 2012-10-02 DIAGNOSIS — J329 Chronic sinusitis, unspecified: Secondary | ICD-10-CM | POA: Diagnosis not present

## 2012-10-05 DIAGNOSIS — H4011X0 Primary open-angle glaucoma, stage unspecified: Secondary | ICD-10-CM | POA: Diagnosis not present

## 2012-10-05 DIAGNOSIS — H04129 Dry eye syndrome of unspecified lacrimal gland: Secondary | ICD-10-CM | POA: Diagnosis not present

## 2012-10-05 DIAGNOSIS — H409 Unspecified glaucoma: Secondary | ICD-10-CM | POA: Diagnosis not present

## 2012-10-05 DIAGNOSIS — H16109 Unspecified superficial keratitis, unspecified eye: Secondary | ICD-10-CM | POA: Diagnosis not present

## 2012-10-16 ENCOUNTER — Other Ambulatory Visit (HOSPITAL_COMMUNITY)
Admission: RE | Admit: 2012-10-16 | Discharge: 2012-10-16 | Disposition: A | Discharge status: Home / Self Care | Payer: Medicare Other | Source: Ambulatory Visit | Attending: Internal Medicine | Admitting: Internal Medicine

## 2012-10-16 ENCOUNTER — Other Ambulatory Visit: Payer: Self-pay | Admitting: Registered Nurse

## 2012-10-16 DIAGNOSIS — I10 Essential (primary) hypertension: Secondary | ICD-10-CM | POA: Diagnosis not present

## 2012-10-16 DIAGNOSIS — Z124 Encounter for screening for malignant neoplasm of cervix: Secondary | ICD-10-CM | POA: Diagnosis not present

## 2012-10-16 DIAGNOSIS — Z01419 Encounter for gynecological examination (general) (routine) without abnormal findings: Secondary | ICD-10-CM | POA: Diagnosis not present

## 2012-10-16 DIAGNOSIS — J309 Allergic rhinitis, unspecified: Secondary | ICD-10-CM | POA: Diagnosis not present

## 2012-11-01 DIAGNOSIS — I10 Essential (primary) hypertension: Secondary | ICD-10-CM | POA: Diagnosis not present

## 2012-11-15 DIAGNOSIS — L739 Follicular disorder, unspecified: Secondary | ICD-10-CM | POA: Diagnosis not present

## 2012-11-15 DIAGNOSIS — L821 Other seborrheic keratosis: Secondary | ICD-10-CM | POA: Diagnosis not present

## 2012-11-15 DIAGNOSIS — Z85828 Personal history of other malignant neoplasm of skin: Secondary | ICD-10-CM | POA: Diagnosis not present

## 2012-11-15 DIAGNOSIS — D239 Other benign neoplasm of skin, unspecified: Secondary | ICD-10-CM | POA: Diagnosis not present

## 2012-11-15 DIAGNOSIS — L679 Hair color and hair shaft abnormality, unspecified: Secondary | ICD-10-CM | POA: Diagnosis not present

## 2013-02-06 DIAGNOSIS — H4011X0 Primary open-angle glaucoma, stage unspecified: Secondary | ICD-10-CM | POA: Diagnosis not present

## 2013-02-18 DIAGNOSIS — H5340 Unspecified visual field defects: Secondary | ICD-10-CM | POA: Diagnosis not present

## 2013-02-18 DIAGNOSIS — H52209 Unspecified astigmatism, unspecified eye: Secondary | ICD-10-CM | POA: Diagnosis not present

## 2013-02-18 DIAGNOSIS — H259 Unspecified age-related cataract: Secondary | ICD-10-CM | POA: Diagnosis not present

## 2013-02-18 DIAGNOSIS — H4011X0 Primary open-angle glaucoma, stage unspecified: Secondary | ICD-10-CM | POA: Diagnosis not present

## 2013-02-25 DIAGNOSIS — N390 Urinary tract infection, site not specified: Secondary | ICD-10-CM | POA: Diagnosis not present

## 2013-03-07 DIAGNOSIS — N390 Urinary tract infection, site not specified: Secondary | ICD-10-CM | POA: Diagnosis not present

## 2013-03-07 DIAGNOSIS — N3000 Acute cystitis without hematuria: Secondary | ICD-10-CM | POA: Diagnosis not present

## 2013-03-07 DIAGNOSIS — L738 Other specified follicular disorders: Secondary | ICD-10-CM | POA: Diagnosis not present

## 2013-03-08 ENCOUNTER — Ambulatory Visit (INDEPENDENT_AMBULATORY_CARE_PROVIDER_SITE_OTHER): Payer: Medicare Other | Admitting: Family Medicine

## 2013-03-08 VITALS — BP 150/70 | HR 75 | Temp 98.0°F | Resp 16 | Ht 63.0 in | Wt 145.0 lb

## 2013-03-08 DIAGNOSIS — R220 Localized swelling, mass and lump, head: Secondary | ICD-10-CM | POA: Diagnosis not present

## 2013-03-08 DIAGNOSIS — F411 Generalized anxiety disorder: Secondary | ICD-10-CM | POA: Diagnosis not present

## 2013-03-08 DIAGNOSIS — E7800 Pure hypercholesterolemia, unspecified: Secondary | ICD-10-CM | POA: Diagnosis not present

## 2013-03-08 DIAGNOSIS — R221 Localized swelling, mass and lump, neck: Secondary | ICD-10-CM

## 2013-03-08 DIAGNOSIS — I10 Essential (primary) hypertension: Secondary | ICD-10-CM | POA: Diagnosis not present

## 2013-03-08 DIAGNOSIS — Z006 Encounter for examination for normal comparison and control in clinical research program: Secondary | ICD-10-CM | POA: Diagnosis not present

## 2013-03-08 NOTE — Progress Notes (Deleted)
  Subjective:    Patient ID: Destiny [name], female    DOB: 08/08/1928, 79 y.o.   MRN: [id number]  HPI    Review of Systems     Objective:   Physical Exam        Assessment & Plan:

## 2013-03-08 NOTE — Patient Instructions (Signed)
I do not think this represents any reaction to your medication. If you continue to feel the place in your soft tissues of the chin let  the dentist check it also.

## 2013-03-08 NOTE — Progress Notes (Signed)
Subjective: Patient is here complaining of a place in her mouth that she noticed today when she was brushing her teeth. She had a little lump down in the skin of the cheek she feels bucca mucosa. She rarely an information on her medication, Prolia, which she had received yesterday it could cause osteonecrosis of the jaw. She was concerned about this and could not find anybody to tell her what was. Her doctor was out today.  Objective: Anxious lady in no acute distress. Mouth looks normal. I can feel a tiny swelling in the soft tissues of the right side of the chin with digital palpation in the buccal trough. This is totally benign feeling. Nothing can be visualized.  Assessment: Mild swelling, nonspecific Anxiety  Plan: Reassurance

## 2013-03-12 DIAGNOSIS — H2510 Age-related nuclear cataract, unspecified eye: Secondary | ICD-10-CM | POA: Diagnosis not present

## 2013-03-12 DIAGNOSIS — H409 Unspecified glaucoma: Secondary | ICD-10-CM | POA: Diagnosis not present

## 2013-03-12 DIAGNOSIS — H4011X0 Primary open-angle glaucoma, stage unspecified: Secondary | ICD-10-CM | POA: Diagnosis not present

## 2013-03-12 DIAGNOSIS — H269 Unspecified cataract: Secondary | ICD-10-CM | POA: Diagnosis not present

## 2013-03-12 DIAGNOSIS — H52209 Unspecified astigmatism, unspecified eye: Secondary | ICD-10-CM | POA: Diagnosis not present

## 2013-03-12 DIAGNOSIS — H25039 Anterior subcapsular polar age-related cataract, unspecified eye: Secondary | ICD-10-CM | POA: Diagnosis not present

## 2013-03-12 DIAGNOSIS — H25019 Cortical age-related cataract, unspecified eye: Secondary | ICD-10-CM | POA: Diagnosis not present

## 2013-03-22 DIAGNOSIS — M5137 Other intervertebral disc degeneration, lumbosacral region: Secondary | ICD-10-CM | POA: Diagnosis not present

## 2013-04-09 DIAGNOSIS — H269 Unspecified cataract: Secondary | ICD-10-CM | POA: Diagnosis not present

## 2013-04-09 DIAGNOSIS — H4011X0 Primary open-angle glaucoma, stage unspecified: Secondary | ICD-10-CM | POA: Diagnosis not present

## 2013-04-09 DIAGNOSIS — H25039 Anterior subcapsular polar age-related cataract, unspecified eye: Secondary | ICD-10-CM | POA: Diagnosis not present

## 2013-04-09 DIAGNOSIS — H25019 Cortical age-related cataract, unspecified eye: Secondary | ICD-10-CM | POA: Diagnosis not present

## 2013-04-09 DIAGNOSIS — H2510 Age-related nuclear cataract, unspecified eye: Secondary | ICD-10-CM | POA: Diagnosis not present

## 2013-04-09 DIAGNOSIS — H409 Unspecified glaucoma: Secondary | ICD-10-CM | POA: Diagnosis not present

## 2013-04-09 DIAGNOSIS — H52209 Unspecified astigmatism, unspecified eye: Secondary | ICD-10-CM | POA: Diagnosis not present

## 2013-06-11 DIAGNOSIS — Z0000 Encounter for general adult medical examination without abnormal findings: Secondary | ICD-10-CM | POA: Diagnosis not present

## 2013-06-18 DIAGNOSIS — E7800 Pure hypercholesterolemia, unspecified: Secondary | ICD-10-CM | POA: Diagnosis not present

## 2013-06-18 DIAGNOSIS — Z0000 Encounter for general adult medical examination without abnormal findings: Secondary | ICD-10-CM | POA: Diagnosis not present

## 2013-06-18 DIAGNOSIS — I10 Essential (primary) hypertension: Secondary | ICD-10-CM | POA: Diagnosis not present

## 2013-06-18 DIAGNOSIS — N390 Urinary tract infection, site not specified: Secondary | ICD-10-CM | POA: Diagnosis not present

## 2013-06-18 DIAGNOSIS — M899 Disorder of bone, unspecified: Secondary | ICD-10-CM | POA: Diagnosis not present

## 2013-06-18 DIAGNOSIS — E039 Hypothyroidism, unspecified: Secondary | ICD-10-CM | POA: Diagnosis not present

## 2013-06-24 DIAGNOSIS — L820 Inflamed seborrheic keratosis: Secondary | ICD-10-CM | POA: Diagnosis not present

## 2013-06-24 DIAGNOSIS — J309 Allergic rhinitis, unspecified: Secondary | ICD-10-CM | POA: Diagnosis not present

## 2013-06-24 DIAGNOSIS — Z1212 Encounter for screening for malignant neoplasm of rectum: Secondary | ICD-10-CM | POA: Diagnosis not present

## 2013-06-24 DIAGNOSIS — I4510 Unspecified right bundle-branch block: Secondary | ICD-10-CM | POA: Diagnosis not present

## 2013-06-24 DIAGNOSIS — R05 Cough: Secondary | ICD-10-CM | POA: Diagnosis not present

## 2013-06-24 DIAGNOSIS — R059 Cough, unspecified: Secondary | ICD-10-CM | POA: Diagnosis not present

## 2013-06-24 DIAGNOSIS — Z23 Encounter for immunization: Secondary | ICD-10-CM | POA: Diagnosis not present

## 2013-06-25 DIAGNOSIS — I499 Cardiac arrhythmia, unspecified: Secondary | ICD-10-CM | POA: Diagnosis not present

## 2013-07-11 DIAGNOSIS — Z1231 Encounter for screening mammogram for malignant neoplasm of breast: Secondary | ICD-10-CM | POA: Diagnosis not present

## 2013-07-17 DIAGNOSIS — H409 Unspecified glaucoma: Secondary | ICD-10-CM | POA: Diagnosis not present

## 2013-07-17 DIAGNOSIS — H47239 Glaucomatous optic atrophy, unspecified eye: Secondary | ICD-10-CM | POA: Diagnosis not present

## 2013-07-17 DIAGNOSIS — Z961 Presence of intraocular lens: Secondary | ICD-10-CM | POA: Diagnosis not present

## 2013-07-17 DIAGNOSIS — H4011X0 Primary open-angle glaucoma, stage unspecified: Secondary | ICD-10-CM | POA: Diagnosis not present

## 2013-07-30 DIAGNOSIS — J309 Allergic rhinitis, unspecified: Secondary | ICD-10-CM | POA: Diagnosis not present

## 2013-07-30 DIAGNOSIS — M545 Low back pain: Secondary | ICD-10-CM | POA: Diagnosis not present

## 2013-08-07 DIAGNOSIS — M545 Low back pain: Secondary | ICD-10-CM | POA: Diagnosis not present

## 2013-08-13 DIAGNOSIS — M545 Low back pain: Secondary | ICD-10-CM | POA: Diagnosis not present

## 2013-08-15 DIAGNOSIS — M545 Low back pain: Secondary | ICD-10-CM | POA: Diagnosis not present

## 2013-08-27 DIAGNOSIS — M545 Low back pain: Secondary | ICD-10-CM | POA: Diagnosis not present

## 2013-08-27 DIAGNOSIS — M899 Disorder of bone, unspecified: Secondary | ICD-10-CM | POA: Diagnosis not present

## 2013-08-27 DIAGNOSIS — J309 Allergic rhinitis, unspecified: Secondary | ICD-10-CM | POA: Diagnosis not present

## 2013-08-29 LAB — HM DEXA SCAN: HM DEXA SCAN: NORMAL

## 2013-08-29 LAB — HM MAMMOGRAPHY

## 2013-08-31 ENCOUNTER — Ambulatory Visit (INDEPENDENT_AMBULATORY_CARE_PROVIDER_SITE_OTHER): Payer: Medicare Other | Admitting: Family Medicine

## 2013-08-31 VITALS — BP 120/80 | HR 72 | Temp 99.2°F | Resp 18 | Ht 64.75 in | Wt 142.0 lb

## 2013-08-31 DIAGNOSIS — J329 Chronic sinusitis, unspecified: Secondary | ICD-10-CM

## 2013-08-31 MED ORDER — AZITHROMYCIN 250 MG PO TABS: ORAL_TABLET | ORAL | Status: DC

## 2013-08-31 MED ORDER — AZELASTINE HCL 0.1 % NA SOLN: NASAL | Status: DC

## 2013-08-31 NOTE — Patient Instructions (Signed)
Drink plenty of fluids to help keep the secretions thin  Use the Astelin spray 1 spray each nostril twice daily  Take the azithromycin 2 pills initially, then one daily for 4 days  I did not do any labs today, but if you get worse we might have to do sinus x-rays or lab work.  Return if not improving

## 2013-08-31 NOTE — Progress Notes (Signed)
Subjective

## 2013-09-03 DIAGNOSIS — M545 Low back pain, unspecified: Secondary | ICD-10-CM | POA: Diagnosis not present

## 2013-09-11 DIAGNOSIS — M545 Low back pain, unspecified: Secondary | ICD-10-CM | POA: Diagnosis not present

## 2013-09-18 DIAGNOSIS — M899 Disorder of bone, unspecified: Secondary | ICD-10-CM | POA: Diagnosis not present

## 2013-09-19 DIAGNOSIS — M545 Low back pain, unspecified: Secondary | ICD-10-CM | POA: Diagnosis not present

## 2013-09-23 DIAGNOSIS — M545 Low back pain, unspecified: Secondary | ICD-10-CM | POA: Diagnosis not present

## 2013-09-25 DIAGNOSIS — M545 Low back pain, unspecified: Secondary | ICD-10-CM | POA: Diagnosis not present

## 2013-10-04 DIAGNOSIS — M545 Low back pain, unspecified: Secondary | ICD-10-CM | POA: Diagnosis not present

## 2013-11-14 DIAGNOSIS — H4011X0 Primary open-angle glaucoma, stage unspecified: Secondary | ICD-10-CM | POA: Diagnosis not present

## 2013-11-14 DIAGNOSIS — H02839 Dermatochalasis of unspecified eye, unspecified eyelid: Secondary | ICD-10-CM | POA: Diagnosis not present

## 2013-11-14 DIAGNOSIS — H409 Unspecified glaucoma: Secondary | ICD-10-CM | POA: Diagnosis not present

## 2013-11-14 DIAGNOSIS — H02409 Unspecified ptosis of unspecified eyelid: Secondary | ICD-10-CM | POA: Diagnosis not present

## 2013-11-21 DIAGNOSIS — L821 Other seborrheic keratosis: Secondary | ICD-10-CM | POA: Diagnosis not present

## 2013-11-21 DIAGNOSIS — L723 Sebaceous cyst: Secondary | ICD-10-CM | POA: Diagnosis not present

## 2013-11-21 DIAGNOSIS — Z85828 Personal history of other malignant neoplasm of skin: Secondary | ICD-10-CM | POA: Diagnosis not present

## 2013-11-21 DIAGNOSIS — D239 Other benign neoplasm of skin, unspecified: Secondary | ICD-10-CM | POA: Diagnosis not present

## 2013-11-21 DIAGNOSIS — L570 Actinic keratosis: Secondary | ICD-10-CM | POA: Diagnosis not present

## 2013-12-30 DIAGNOSIS — J019 Acute sinusitis, unspecified: Secondary | ICD-10-CM | POA: Diagnosis not present

## 2013-12-31 DIAGNOSIS — H02419 Mechanical ptosis of unspecified eyelid: Secondary | ICD-10-CM | POA: Diagnosis not present

## 2013-12-31 DIAGNOSIS — H02409 Unspecified ptosis of unspecified eyelid: Secondary | ICD-10-CM | POA: Diagnosis not present

## 2013-12-31 DIAGNOSIS — H11439 Conjunctival hyperemia, unspecified eye: Secondary | ICD-10-CM | POA: Diagnosis not present

## 2013-12-31 DIAGNOSIS — L908 Other atrophic disorders of skin: Secondary | ICD-10-CM | POA: Diagnosis not present

## 2014-03-10 DIAGNOSIS — L918 Other hypertrophic disorders of the skin: Secondary | ICD-10-CM | POA: Diagnosis not present

## 2014-03-10 DIAGNOSIS — H02409 Unspecified ptosis of unspecified eyelid: Secondary | ICD-10-CM | POA: Diagnosis not present

## 2014-03-10 DIAGNOSIS — H02839 Dermatochalasis of unspecified eye, unspecified eyelid: Secondary | ICD-10-CM | POA: Diagnosis not present

## 2014-03-10 DIAGNOSIS — L908 Other atrophic disorders of skin: Secondary | ICD-10-CM | POA: Diagnosis not present

## 2014-03-10 DIAGNOSIS — H02429 Myogenic ptosis of unspecified eyelid: Secondary | ICD-10-CM | POA: Diagnosis not present

## 2014-03-10 HISTORY — PX: OTHER SURGICAL HISTORY: SHX169

## 2014-03-12 DIAGNOSIS — R300 Dysuria: Secondary | ICD-10-CM | POA: Diagnosis not present

## 2014-03-12 DIAGNOSIS — N390 Urinary tract infection, site not specified: Secondary | ICD-10-CM | POA: Diagnosis not present

## 2014-03-18 DIAGNOSIS — M899 Disorder of bone, unspecified: Secondary | ICD-10-CM | POA: Diagnosis not present

## 2014-03-26 DIAGNOSIS — Z961 Presence of intraocular lens: Secondary | ICD-10-CM | POA: Diagnosis not present

## 2014-03-26 DIAGNOSIS — H02409 Unspecified ptosis of unspecified eyelid: Secondary | ICD-10-CM | POA: Diagnosis not present

## 2014-03-26 DIAGNOSIS — H4011X0 Primary open-angle glaucoma, stage unspecified: Secondary | ICD-10-CM | POA: Diagnosis not present

## 2014-03-26 DIAGNOSIS — H409 Unspecified glaucoma: Secondary | ICD-10-CM | POA: Diagnosis not present

## 2014-04-16 DIAGNOSIS — R5381 Other malaise: Secondary | ICD-10-CM | POA: Diagnosis not present

## 2014-04-16 DIAGNOSIS — R5383 Other fatigue: Secondary | ICD-10-CM | POA: Diagnosis not present

## 2014-05-14 DIAGNOSIS — M169 Osteoarthritis of hip, unspecified: Secondary | ICD-10-CM | POA: Diagnosis not present

## 2014-05-14 DIAGNOSIS — M25559 Pain in unspecified hip: Secondary | ICD-10-CM | POA: Diagnosis not present

## 2014-05-14 DIAGNOSIS — Z96649 Presence of unspecified artificial hip joint: Secondary | ICD-10-CM | POA: Diagnosis not present

## 2014-05-14 DIAGNOSIS — M1610 Unilateral primary osteoarthritis, unspecified hip: Secondary | ICD-10-CM | POA: Diagnosis not present

## 2014-05-28 DIAGNOSIS — Z23 Encounter for immunization: Secondary | ICD-10-CM | POA: Diagnosis not present

## 2014-05-28 DIAGNOSIS — R5381 Other malaise: Secondary | ICD-10-CM | POA: Diagnosis not present

## 2014-05-28 DIAGNOSIS — R5383 Other fatigue: Secondary | ICD-10-CM | POA: Diagnosis not present

## 2014-06-16 DIAGNOSIS — H02422 Myogenic ptosis of left eyelid: Secondary | ICD-10-CM | POA: Diagnosis not present

## 2014-07-08 DIAGNOSIS — E039 Hypothyroidism, unspecified: Secondary | ICD-10-CM | POA: Diagnosis not present

## 2014-07-08 DIAGNOSIS — I10 Essential (primary) hypertension: Secondary | ICD-10-CM | POA: Diagnosis not present

## 2014-07-08 DIAGNOSIS — E780 Pure hypercholesterolemia: Secondary | ICD-10-CM | POA: Diagnosis not present

## 2014-07-08 DIAGNOSIS — Z0000 Encounter for general adult medical examination without abnormal findings: Secondary | ICD-10-CM | POA: Diagnosis not present

## 2014-07-08 DIAGNOSIS — M8588 Other specified disorders of bone density and structure, other site: Secondary | ICD-10-CM | POA: Diagnosis not present

## 2014-07-08 DIAGNOSIS — N390 Urinary tract infection, site not specified: Secondary | ICD-10-CM | POA: Diagnosis not present

## 2014-07-09 DIAGNOSIS — Z0000 Encounter for general adult medical examination without abnormal findings: Secondary | ICD-10-CM | POA: Diagnosis not present

## 2014-07-14 DIAGNOSIS — R140 Abdominal distension (gaseous): Secondary | ICD-10-CM | POA: Diagnosis not present

## 2014-07-14 DIAGNOSIS — Z86010 Personal history of colonic polyps: Secondary | ICD-10-CM | POA: Diagnosis not present

## 2014-07-14 DIAGNOSIS — R0989 Other specified symptoms and signs involving the circulatory and respiratory systems: Secondary | ICD-10-CM | POA: Diagnosis not present

## 2014-07-14 DIAGNOSIS — Z23 Encounter for immunization: Secondary | ICD-10-CM | POA: Diagnosis not present

## 2014-07-14 DIAGNOSIS — K219 Gastro-esophageal reflux disease without esophagitis: Secondary | ICD-10-CM | POA: Diagnosis not present

## 2014-07-17 DIAGNOSIS — H4011X2 Primary open-angle glaucoma, moderate stage: Secondary | ICD-10-CM | POA: Diagnosis not present

## 2014-07-17 DIAGNOSIS — H02402 Unspecified ptosis of left eyelid: Secondary | ICD-10-CM | POA: Diagnosis not present

## 2014-07-17 DIAGNOSIS — Z961 Presence of intraocular lens: Secondary | ICD-10-CM | POA: Diagnosis not present

## 2014-07-21 DIAGNOSIS — Z1231 Encounter for screening mammogram for malignant neoplasm of breast: Secondary | ICD-10-CM | POA: Diagnosis not present

## 2014-08-04 DIAGNOSIS — H02422 Myogenic ptosis of left eyelid: Secondary | ICD-10-CM | POA: Diagnosis not present

## 2014-08-04 DIAGNOSIS — H02402 Unspecified ptosis of left eyelid: Secondary | ICD-10-CM | POA: Diagnosis not present

## 2014-08-05 DIAGNOSIS — M810 Age-related osteoporosis without current pathological fracture: Secondary | ICD-10-CM | POA: Diagnosis not present

## 2014-08-05 LAB — HM DEXA SCAN

## 2014-08-11 DIAGNOSIS — S0010XA Contusion of unspecified eyelid and periocular area, initial encounter: Secondary | ICD-10-CM | POA: Diagnosis not present

## 2014-09-01 DIAGNOSIS — Z471 Aftercare following joint replacement surgery: Secondary | ICD-10-CM | POA: Diagnosis not present

## 2014-09-01 DIAGNOSIS — Z96641 Presence of right artificial hip joint: Secondary | ICD-10-CM | POA: Diagnosis not present

## 2014-09-01 DIAGNOSIS — M4806 Spinal stenosis, lumbar region: Secondary | ICD-10-CM | POA: Diagnosis not present

## 2014-09-22 DIAGNOSIS — M4806 Spinal stenosis, lumbar region: Secondary | ICD-10-CM | POA: Diagnosis not present

## 2014-09-22 DIAGNOSIS — M961 Postlaminectomy syndrome, not elsewhere classified: Secondary | ICD-10-CM | POA: Diagnosis not present

## 2014-09-30 DIAGNOSIS — M4806 Spinal stenosis, lumbar region: Secondary | ICD-10-CM | POA: Diagnosis not present

## 2014-09-30 DIAGNOSIS — M545 Low back pain: Secondary | ICD-10-CM | POA: Diagnosis not present

## 2014-09-30 DIAGNOSIS — M961 Postlaminectomy syndrome, not elsewhere classified: Secondary | ICD-10-CM | POA: Diagnosis not present

## 2014-09-30 DIAGNOSIS — M6281 Muscle weakness (generalized): Secondary | ICD-10-CM | POA: Diagnosis not present

## 2014-10-02 DIAGNOSIS — M4806 Spinal stenosis, lumbar region: Secondary | ICD-10-CM | POA: Diagnosis not present

## 2014-10-02 DIAGNOSIS — M6281 Muscle weakness (generalized): Secondary | ICD-10-CM | POA: Diagnosis not present

## 2014-10-02 DIAGNOSIS — M961 Postlaminectomy syndrome, not elsewhere classified: Secondary | ICD-10-CM | POA: Diagnosis not present

## 2014-10-02 DIAGNOSIS — M545 Low back pain: Secondary | ICD-10-CM | POA: Diagnosis not present

## 2014-10-06 DIAGNOSIS — M961 Postlaminectomy syndrome, not elsewhere classified: Secondary | ICD-10-CM | POA: Diagnosis not present

## 2014-10-06 DIAGNOSIS — M6281 Muscle weakness (generalized): Secondary | ICD-10-CM | POA: Diagnosis not present

## 2014-10-06 DIAGNOSIS — M4806 Spinal stenosis, lumbar region: Secondary | ICD-10-CM | POA: Diagnosis not present

## 2014-10-06 DIAGNOSIS — M545 Low back pain: Secondary | ICD-10-CM | POA: Diagnosis not present

## 2014-10-09 DIAGNOSIS — M6281 Muscle weakness (generalized): Secondary | ICD-10-CM | POA: Diagnosis not present

## 2014-10-09 DIAGNOSIS — M4806 Spinal stenosis, lumbar region: Secondary | ICD-10-CM | POA: Diagnosis not present

## 2014-10-09 DIAGNOSIS — M545 Low back pain: Secondary | ICD-10-CM | POA: Diagnosis not present

## 2014-10-09 DIAGNOSIS — M961 Postlaminectomy syndrome, not elsewhere classified: Secondary | ICD-10-CM | POA: Diagnosis not present

## 2014-10-15 DIAGNOSIS — M4806 Spinal stenosis, lumbar region: Secondary | ICD-10-CM | POA: Diagnosis not present

## 2014-10-15 DIAGNOSIS — M6281 Muscle weakness (generalized): Secondary | ICD-10-CM | POA: Diagnosis not present

## 2014-10-15 DIAGNOSIS — M961 Postlaminectomy syndrome, not elsewhere classified: Secondary | ICD-10-CM | POA: Diagnosis not present

## 2014-10-15 DIAGNOSIS — M545 Low back pain: Secondary | ICD-10-CM | POA: Diagnosis not present

## 2014-10-21 DIAGNOSIS — M961 Postlaminectomy syndrome, not elsewhere classified: Secondary | ICD-10-CM | POA: Diagnosis not present

## 2014-10-21 DIAGNOSIS — M4806 Spinal stenosis, lumbar region: Secondary | ICD-10-CM | POA: Diagnosis not present

## 2014-10-23 DIAGNOSIS — M4806 Spinal stenosis, lumbar region: Secondary | ICD-10-CM | POA: Diagnosis not present

## 2014-10-23 DIAGNOSIS — M961 Postlaminectomy syndrome, not elsewhere classified: Secondary | ICD-10-CM | POA: Diagnosis not present

## 2014-10-23 DIAGNOSIS — M545 Low back pain: Secondary | ICD-10-CM | POA: Diagnosis not present

## 2014-10-23 DIAGNOSIS — M6281 Muscle weakness (generalized): Secondary | ICD-10-CM | POA: Diagnosis not present

## 2014-12-01 DIAGNOSIS — H02402 Unspecified ptosis of left eyelid: Secondary | ICD-10-CM | POA: Diagnosis not present

## 2014-12-01 DIAGNOSIS — H4011X2 Primary open-angle glaucoma, moderate stage: Secondary | ICD-10-CM | POA: Diagnosis not present

## 2014-12-02 DIAGNOSIS — L720 Epidermal cyst: Secondary | ICD-10-CM | POA: Diagnosis not present

## 2014-12-02 DIAGNOSIS — Z411 Encounter for cosmetic surgery: Secondary | ICD-10-CM | POA: Diagnosis not present

## 2014-12-02 DIAGNOSIS — Z85828 Personal history of other malignant neoplasm of skin: Secondary | ICD-10-CM | POA: Diagnosis not present

## 2014-12-02 DIAGNOSIS — L821 Other seborrheic keratosis: Secondary | ICD-10-CM | POA: Diagnosis not present

## 2014-12-02 DIAGNOSIS — D225 Melanocytic nevi of trunk: Secondary | ICD-10-CM | POA: Diagnosis not present

## 2014-12-02 DIAGNOSIS — L570 Actinic keratosis: Secondary | ICD-10-CM | POA: Diagnosis not present

## 2014-12-15 DIAGNOSIS — H5340 Unspecified visual field defects: Secondary | ICD-10-CM | POA: Diagnosis not present

## 2014-12-15 DIAGNOSIS — H4011X2 Primary open-angle glaucoma, moderate stage: Secondary | ICD-10-CM | POA: Diagnosis not present

## 2014-12-15 DIAGNOSIS — Z961 Presence of intraocular lens: Secondary | ICD-10-CM | POA: Diagnosis not present

## 2014-12-26 DIAGNOSIS — I10 Essential (primary) hypertension: Secondary | ICD-10-CM | POA: Diagnosis not present

## 2014-12-26 DIAGNOSIS — R140 Abdominal distension (gaseous): Secondary | ICD-10-CM | POA: Diagnosis not present

## 2014-12-26 DIAGNOSIS — B351 Tinea unguium: Secondary | ICD-10-CM | POA: Diagnosis not present

## 2014-12-26 DIAGNOSIS — R5383 Other fatigue: Secondary | ICD-10-CM | POA: Diagnosis not present

## 2015-01-01 DIAGNOSIS — R140 Abdominal distension (gaseous): Secondary | ICD-10-CM | POA: Diagnosis not present

## 2015-01-01 DIAGNOSIS — R143 Flatulence: Secondary | ICD-10-CM | POA: Diagnosis not present

## 2015-01-07 DIAGNOSIS — M810 Age-related osteoporosis without current pathological fracture: Secondary | ICD-10-CM | POA: Diagnosis not present

## 2015-01-09 ENCOUNTER — Ambulatory Visit: Payer: Medicare Other | Admitting: Podiatry

## 2015-01-14 ENCOUNTER — Ambulatory Visit (INDEPENDENT_AMBULATORY_CARE_PROVIDER_SITE_OTHER): Payer: Medicare Other | Admitting: Podiatry

## 2015-01-14 ENCOUNTER — Encounter: Payer: Self-pay | Admitting: Podiatry

## 2015-01-14 VITALS — BP 141/72 | HR 65 | Resp 15

## 2015-01-14 DIAGNOSIS — M779 Enthesopathy, unspecified: Secondary | ICD-10-CM

## 2015-01-14 DIAGNOSIS — L600 Ingrowing nail: Secondary | ICD-10-CM

## 2015-01-14 NOTE — Patient Instructions (Signed)

## 2015-01-14 NOTE — Progress Notes (Signed)
   Subjective:    Patient ID: Destiny [name], female    DOB: 1928/03/22, 81 y.o.   MRN: [id number]  HPI Pt presents with bilateral great nails that are thick, right great is painful at lateral border. Also wanting to get some sort of arch support for her shoes   Review of Systems  Genitourinary: Positive for frequency.  All other systems reviewed and are negative.      Objective:   Physical Exam        Assessment & Plan:

## 2015-01-15 NOTE — Progress Notes (Signed)
Subjective:     Patient ID: Destiny [name], female   DOB: Sep 04, 1927, 81 y.o.   MRN: [id number]  HPI patient presents stating I got moderate depression of my arch bilateral with nails that are thick that I don't think anything can be done for her. My arches do hurt   Review of Systems     Objective:   Physical Exam Neurovascular status are found to be intact with muscle strength adequate and moderate depression of the arch upon weightbearing. There is discomfort around posterior tibial tendon and the medial arch bilateral and nail thickness of the hallux bilateral    Assessment:     Tendinitis bilateral secondary to foot structure along with mycotic nail infection    Plan:     H&P performed and I've recommended long-term orthotics which is had in the past and they have worn out of which she wants. For the nails I've recommended topical medicine and she will reappoint for orthotic pickup and take care of any other problems

## 2015-01-23 ENCOUNTER — Encounter: Payer: Self-pay | Admitting: Podiatry

## 2015-01-23 ENCOUNTER — Ambulatory Visit (INDEPENDENT_AMBULATORY_CARE_PROVIDER_SITE_OTHER): Payer: Medicare Other | Admitting: Podiatry

## 2015-01-23 VITALS — BP 146/74 | HR 74 | Resp 12

## 2015-01-23 DIAGNOSIS — L600 Ingrowing nail: Secondary | ICD-10-CM

## 2015-01-23 NOTE — Patient Instructions (Signed)

## 2015-01-27 DIAGNOSIS — I10 Essential (primary) hypertension: Secondary | ICD-10-CM | POA: Diagnosis not present

## 2015-01-27 NOTE — Progress Notes (Signed)
Subjective:     Patient ID: Destiny [name], female   DOB: November 14, 1927, 81 y.o.   MRN: [id number]  HPI patient presents stating my right big toenail I want it removed as it's been thick hard for me to cut and painful   Review of Systems     Objective:   Physical Exam Neurovascular status intact with thick brittle nailbeds hallux right that's painful when pressed    Assessment:     Damaged thick painful hallux nail right that's painful when pressed    Plan:     Reviewed condition and recommended removal of nail which patient wants. Explained risk of procedure and at this time I infiltrated the right hallux 60 mg Xylocaine Marcaine mixture remove the hallux nail exposed matrix and applied phenol 3 applications 30 seconds followed by alcohol lavage and sterile dressing. Gave instructions on soaks and reappoint

## 2015-02-09 ENCOUNTER — Telehealth: Payer: Self-pay | Admitting: *Deleted

## 2015-02-09 NOTE — Telephone Encounter (Signed)
Pt states she had an ingrown toenail procedure on 01/23/2015 and the toenails are still oozing and she has a shooting pain through them.

## 2015-02-10 ENCOUNTER — Ambulatory Visit (INDEPENDENT_AMBULATORY_CARE_PROVIDER_SITE_OTHER): Payer: Medicare Other | Admitting: Podiatry

## 2015-02-10 DIAGNOSIS — L600 Ingrowing nail: Secondary | ICD-10-CM

## 2015-02-17 NOTE — Progress Notes (Signed)
Subjective:     Patient ID: Destiny [name], female   DOB: 05/04/1928, 81 y.o.   MRN: [id number]  HPI patient states that I wanted to get this right foot checked as there was some still drainage on the surface and I was worried that it may be infected   Review of Systems     Objective:   Physical Exam Neurovascular status intact with upon evaluation dorsal right nail bed is healing well with no current drainage and a normalized appearance for this. After nail removal    Assessment:     Doing well with no indication of infection    Plan:     Advised on continued soaks and bandage therapy during the day and reappoint for [place] to reevaluate

## 2015-04-07 DIAGNOSIS — R140 Abdominal distension (gaseous): Secondary | ICD-10-CM | POA: Diagnosis not present

## 2015-04-07 DIAGNOSIS — R143 Flatulence: Secondary | ICD-10-CM | POA: Diagnosis not present

## 2015-05-20 DIAGNOSIS — M19012 Primary osteoarthritis, left shoulder: Secondary | ICD-10-CM | POA: Diagnosis not present

## 2015-05-25 DIAGNOSIS — H4011X2 Primary open-angle glaucoma, moderate stage: Secondary | ICD-10-CM | POA: Diagnosis not present

## 2015-05-25 DIAGNOSIS — Z961 Presence of intraocular lens: Secondary | ICD-10-CM | POA: Diagnosis not present

## 2015-05-25 DIAGNOSIS — H52203 Unspecified astigmatism, bilateral: Secondary | ICD-10-CM | POA: Diagnosis not present

## 2015-06-09 DIAGNOSIS — M545 Low back pain: Secondary | ICD-10-CM | POA: Diagnosis not present

## 2015-06-09 DIAGNOSIS — M5136 Other intervertebral disc degeneration, lumbar region: Secondary | ICD-10-CM | POA: Diagnosis not present

## 2015-06-09 DIAGNOSIS — M25551 Pain in right hip: Secondary | ICD-10-CM | POA: Diagnosis not present

## 2015-06-09 DIAGNOSIS — Z23 Encounter for immunization: Secondary | ICD-10-CM | POA: Diagnosis not present

## 2015-06-12 DIAGNOSIS — Z96641 Presence of right artificial hip joint: Secondary | ICD-10-CM | POA: Diagnosis not present

## 2015-06-12 DIAGNOSIS — Z471 Aftercare following joint replacement surgery: Secondary | ICD-10-CM | POA: Diagnosis not present

## 2015-06-12 DIAGNOSIS — M7061 Trochanteric bursitis, right hip: Secondary | ICD-10-CM | POA: Diagnosis not present

## 2015-06-16 DIAGNOSIS — R143 Flatulence: Secondary | ICD-10-CM | POA: Diagnosis not present

## 2015-06-16 DIAGNOSIS — R140 Abdominal distension (gaseous): Secondary | ICD-10-CM | POA: Diagnosis not present

## 2015-06-17 DIAGNOSIS — M79604 Pain in right leg: Secondary | ICD-10-CM | POA: Diagnosis not present

## 2015-06-18 DIAGNOSIS — M9901 Segmental and somatic dysfunction of cervical region: Secondary | ICD-10-CM | POA: Diagnosis not present

## 2015-06-18 DIAGNOSIS — M47892 Other spondylosis, cervical region: Secondary | ICD-10-CM | POA: Diagnosis not present

## 2015-06-18 DIAGNOSIS — M47816 Spondylosis without myelopathy or radiculopathy, lumbar region: Secondary | ICD-10-CM | POA: Diagnosis not present

## 2015-06-18 DIAGNOSIS — M9903 Segmental and somatic dysfunction of lumbar region: Secondary | ICD-10-CM | POA: Diagnosis not present

## 2015-06-20 DIAGNOSIS — J0110 Acute frontal sinusitis, unspecified: Secondary | ICD-10-CM | POA: Diagnosis not present

## 2015-06-22 DIAGNOSIS — M47816 Spondylosis without myelopathy or radiculopathy, lumbar region: Secondary | ICD-10-CM | POA: Diagnosis not present

## 2015-06-22 DIAGNOSIS — M47892 Other spondylosis, cervical region: Secondary | ICD-10-CM | POA: Diagnosis not present

## 2015-06-22 DIAGNOSIS — M9903 Segmental and somatic dysfunction of lumbar region: Secondary | ICD-10-CM | POA: Diagnosis not present

## 2015-06-22 DIAGNOSIS — M9901 Segmental and somatic dysfunction of cervical region: Secondary | ICD-10-CM | POA: Diagnosis not present

## 2015-06-23 DIAGNOSIS — M47816 Spondylosis without myelopathy or radiculopathy, lumbar region: Secondary | ICD-10-CM | POA: Diagnosis not present

## 2015-06-23 DIAGNOSIS — M9903 Segmental and somatic dysfunction of lumbar region: Secondary | ICD-10-CM | POA: Diagnosis not present

## 2015-06-23 DIAGNOSIS — M47892 Other spondylosis, cervical region: Secondary | ICD-10-CM | POA: Diagnosis not present

## 2015-06-23 DIAGNOSIS — M9901 Segmental and somatic dysfunction of cervical region: Secondary | ICD-10-CM | POA: Diagnosis not present

## 2015-06-24 DIAGNOSIS — M47892 Other spondylosis, cervical region: Secondary | ICD-10-CM | POA: Diagnosis not present

## 2015-06-24 DIAGNOSIS — M9903 Segmental and somatic dysfunction of lumbar region: Secondary | ICD-10-CM | POA: Diagnosis not present

## 2015-06-24 DIAGNOSIS — M47816 Spondylosis without myelopathy or radiculopathy, lumbar region: Secondary | ICD-10-CM | POA: Diagnosis not present

## 2015-06-24 DIAGNOSIS — M9901 Segmental and somatic dysfunction of cervical region: Secondary | ICD-10-CM | POA: Diagnosis not present

## 2015-06-29 DIAGNOSIS — M9901 Segmental and somatic dysfunction of cervical region: Secondary | ICD-10-CM | POA: Diagnosis not present

## 2015-06-29 DIAGNOSIS — M47816 Spondylosis without myelopathy or radiculopathy, lumbar region: Secondary | ICD-10-CM | POA: Diagnosis not present

## 2015-06-29 DIAGNOSIS — M9903 Segmental and somatic dysfunction of lumbar region: Secondary | ICD-10-CM | POA: Diagnosis not present

## 2015-06-29 DIAGNOSIS — M47892 Other spondylosis, cervical region: Secondary | ICD-10-CM | POA: Diagnosis not present

## 2015-06-30 DIAGNOSIS — M9903 Segmental and somatic dysfunction of lumbar region: Secondary | ICD-10-CM | POA: Diagnosis not present

## 2015-06-30 DIAGNOSIS — M47816 Spondylosis without myelopathy or radiculopathy, lumbar region: Secondary | ICD-10-CM | POA: Diagnosis not present

## 2015-06-30 DIAGNOSIS — M47892 Other spondylosis, cervical region: Secondary | ICD-10-CM | POA: Diagnosis not present

## 2015-06-30 DIAGNOSIS — M9901 Segmental and somatic dysfunction of cervical region: Secondary | ICD-10-CM | POA: Diagnosis not present

## 2015-07-01 ENCOUNTER — Ambulatory Visit (INDEPENDENT_AMBULATORY_CARE_PROVIDER_SITE_OTHER): Payer: Medicare Other | Admitting: Sports Medicine

## 2015-07-01 ENCOUNTER — Encounter: Payer: Self-pay | Admitting: Sports Medicine

## 2015-07-01 ENCOUNTER — Other Ambulatory Visit: Payer: Self-pay | Admitting: Sports Medicine

## 2015-07-01 ENCOUNTER — Ambulatory Visit: Payer: Self-pay | Admitting: Sports Medicine

## 2015-07-01 VITALS — BP 123/63 | HR 62 | Ht 63.0 in | Wt 132.0 lb

## 2015-07-01 DIAGNOSIS — M25561 Pain in right knee: Secondary | ICD-10-CM

## 2015-07-01 DIAGNOSIS — M9901 Segmental and somatic dysfunction of cervical region: Secondary | ICD-10-CM | POA: Diagnosis not present

## 2015-07-01 DIAGNOSIS — M47892 Other spondylosis, cervical region: Secondary | ICD-10-CM | POA: Diagnosis not present

## 2015-07-01 DIAGNOSIS — M9903 Segmental and somatic dysfunction of lumbar region: Secondary | ICD-10-CM | POA: Diagnosis not present

## 2015-07-01 DIAGNOSIS — M47816 Spondylosis without myelopathy or radiculopathy, lumbar region: Secondary | ICD-10-CM | POA: Diagnosis not present

## 2015-07-01 MED ORDER — GABAPENTIN 100 MG PO CAPS: 100.0000 mg | ORAL_CAPSULE | Freq: Every day | ORAL | Status: DC

## 2015-07-01 NOTE — Progress Notes (Signed)
Subjective:    Patient ID: Destiny [name], female    DOB: July 17, 1928, 81 y.o.   MRN: [id number]  HPI 81 year old female with multiple medical problems including a remote history of right hip arthroplasty and history of lumbar laminectomy with decompression of L4 nerve root performed 4 years ago. She presents to family medicine clinic on referral from her primary care physician for right-sided low back pain with possible radicular symptoms along the posterior thigh.  Patient reports having similar symptoms in January 2016 and was seen by her orthopedic surgeon, as she was concerned that her artificial joint could be dysfunctioning. Joint was normal and the patient was then sent by orthopedics to physical therapy with 100% resolution in her pain.  Pain returned without provoking injury or new exercise/fall in mid September 2016. She was seen again by orthopedic surgery regarding her arthroplasty and had normal films; then sent to pain management/physiatry within their practice with no benefit. Dr Shelia Media then evaluated her and placed sports medicine consult and recommended she attempt chiropractic care   the patient had no response to chiropractic care and is actually reporting that her symptoms have worsened. Today, she reports that her symptoms are predominantly in her right low back area near the SI joint and that with ambulation greater than 10 steps or prolonged standing she develops pain inferior to that area this described as a tightness and then becomes a sharp pain radiating down her leg until the popliteal fossa  Past medical history, past surgical history were both reviewed -Pertinent surgical history includes right hip arthroplasty in November 1999; laminectomy with L4 nerve root decompression performed in 2012  Medications, social history, allergies -Reviewed -Pertinent social history patient lives in an assisted living community but lives along there  -Nonsmoker; nondrinker  Review  of Systems 10 point review of systems was negative other than detailed above    Objective:   Physical Exam  General: -- Well appearing and in NAD; resting comfortably on exam table  Cardiopulmonary: -- Non-labored respirations -- No JVD; 2+ pulses in distal lower extremities  Skin/derm: -- No rashes present on the examined skin of abdomen/chest/back and the lower  extremities  Psych: -- Appropriate mood/affect; normal thought content  Low back exam/neurologic exam: -No apparent abnormalities on inspection -The patient has flexion to 30 and extension to 10 without pain; rotation and side bending are symmetric bilaterally; patient rotates to 30 in each direction, side bends to 20 in each direction  -4+/5 strength with right great toe extension; otherwise 5/5 strength bilaterally in hip extension, knee extension, dorsiflexion, plantarflexion and inversion/eversion of the ankle. No atrophy  -Asymmetric DTR; 1/4 in the RIGHT patellar tendon; 2-3/4 DTR in the LEFT -faint DTR in the RIGHT Achilles tendon; 1/4 DTR on the left Achilles tendon  -Negative straight leg raise and contralateral leg raise bilaterally -POSITIVE Faber bilaterally, more painful/pronounced on the right -On supine exam, patient is extremely tender to palpation along distribution of piriformis; palpation in the lateral one third of the muscle reproduces radicular symptoms described above in history of present illness       Assessment & Plan:   81 year old female with multiple medical problems presenting on referral from primary care physician for evaluation for back pain with possible radicular symptoms.  #1. SI joint dysfunction See below  #2. Piriformis syndrome with sciatica Patient with multiple medical problems and significant examination findings to include a decreased right sided DTR at the patellar tendon and Achilles tendon, also having  4+/5 strength in right great toe extension. These findings are  uncertain origin and duration as this is her first examination in our clinic, they may represent her postoperative baseline following laminectomy and decompression for years ago. Suspect that she has SI joint dysfunction and also piriformis syndrome, with piriformis syndrome causing her intermittent sciatica symptoms. Given her age and 100% resolution and prior symptoms with physical therapy but would recommend resuming that intervention. Somewhat reassured that although there is no L5 reflex 4 DTR, her area of weakness on exam does not correlate with one of the areas of decreased DTR on exam (L4 and S1 nerve root DTRs,  respectively)   Plan: -Follow-up in 4 weeks -Resume physical therapy. Patient's symptoms are identical in nature to those that responded favorably to physical therapy in the past. Prescription written to target SI joint as well as piriformis syndrome -Gabapentin 100 mg daily at bedtime for neuropathy. D/C tramadol -May use acetaminophen scheduled for suspected/presumed osteoarthritis -Limit naproxen use to as needed only for pain that does not resolve with gabapentin and Tylenol above given age and associated risk of GI bleed  - If symptoms persist a follow-up then I would consider getting some updated imaging of her lumbar spine.

## 2015-07-09 DIAGNOSIS — R260 Ataxic gait: Secondary | ICD-10-CM | POA: Diagnosis not present

## 2015-07-09 DIAGNOSIS — M810 Age-related osteoporosis without current pathological fracture: Secondary | ICD-10-CM | POA: Diagnosis not present

## 2015-07-09 DIAGNOSIS — M5441 Lumbago with sciatica, right side: Secondary | ICD-10-CM | POA: Diagnosis not present

## 2015-07-09 DIAGNOSIS — M5431 Sciatica, right side: Secondary | ICD-10-CM | POA: Diagnosis not present

## 2015-07-09 DIAGNOSIS — M4806 Spinal stenosis, lumbar region: Secondary | ICD-10-CM | POA: Diagnosis not present

## 2015-07-09 DIAGNOSIS — R293 Abnormal posture: Secondary | ICD-10-CM | POA: Diagnosis not present

## 2015-07-10 DIAGNOSIS — R293 Abnormal posture: Secondary | ICD-10-CM | POA: Diagnosis not present

## 2015-07-10 DIAGNOSIS — M4806 Spinal stenosis, lumbar region: Secondary | ICD-10-CM | POA: Diagnosis not present

## 2015-07-10 DIAGNOSIS — M5441 Lumbago with sciatica, right side: Secondary | ICD-10-CM | POA: Diagnosis not present

## 2015-07-10 DIAGNOSIS — M5431 Sciatica, right side: Secondary | ICD-10-CM | POA: Diagnosis not present

## 2015-07-10 DIAGNOSIS — R260 Ataxic gait: Secondary | ICD-10-CM | POA: Diagnosis not present

## 2015-07-11 ENCOUNTER — Emergency Department (HOSPITAL_COMMUNITY): Payer: Medicare Other

## 2015-07-11 ENCOUNTER — Emergency Department (HOSPITAL_COMMUNITY)
Admission: EM | Admit: 2015-07-11 | Discharge: 2015-07-11 | Disposition: A | Discharge status: Home / Self Care | Payer: Medicare Other | Attending: Emergency Medicine | Admitting: Emergency Medicine

## 2015-07-11 ENCOUNTER — Encounter (HOSPITAL_COMMUNITY): Payer: Self-pay | Admitting: *Deleted

## 2015-07-11 DIAGNOSIS — E7800 Pure hypercholesterolemia, unspecified: Secondary | ICD-10-CM | POA: Insufficient documentation

## 2015-07-11 DIAGNOSIS — R531 Weakness: Secondary | ICD-10-CM | POA: Diagnosis not present

## 2015-07-11 DIAGNOSIS — Z791 Long term (current) use of non-steroidal anti-inflammatories (NSAID): Secondary | ICD-10-CM | POA: Insufficient documentation

## 2015-07-11 DIAGNOSIS — R1111 Vomiting without nausea: Secondary | ICD-10-CM | POA: Diagnosis not present

## 2015-07-11 DIAGNOSIS — H269 Unspecified cataract: Secondary | ICD-10-CM | POA: Insufficient documentation

## 2015-07-11 DIAGNOSIS — R630 Anorexia: Secondary | ICD-10-CM | POA: Insufficient documentation

## 2015-07-11 DIAGNOSIS — R112 Nausea with vomiting, unspecified: Secondary | ICD-10-CM | POA: Diagnosis not present

## 2015-07-11 DIAGNOSIS — R109 Unspecified abdominal pain: Secondary | ICD-10-CM | POA: Diagnosis not present

## 2015-07-11 DIAGNOSIS — K219 Gastro-esophageal reflux disease without esophagitis: Secondary | ICD-10-CM | POA: Insufficient documentation

## 2015-07-11 DIAGNOSIS — M1990 Unspecified osteoarthritis, unspecified site: Secondary | ICD-10-CM | POA: Diagnosis not present

## 2015-07-11 DIAGNOSIS — E039 Hypothyroidism, unspecified: Secondary | ICD-10-CM | POA: Insufficient documentation

## 2015-07-11 DIAGNOSIS — Z880 Allergy status to penicillin: Secondary | ICD-10-CM | POA: Insufficient documentation

## 2015-07-11 DIAGNOSIS — Z79899 Other long term (current) drug therapy: Secondary | ICD-10-CM | POA: Insufficient documentation

## 2015-07-11 DIAGNOSIS — I10 Essential (primary) hypertension: Secondary | ICD-10-CM | POA: Insufficient documentation

## 2015-07-11 DIAGNOSIS — M810 Age-related osteoporosis without current pathological fracture: Secondary | ICD-10-CM | POA: Insufficient documentation

## 2015-07-11 DIAGNOSIS — R197 Diarrhea, unspecified: Secondary | ICD-10-CM | POA: Diagnosis not present

## 2015-07-11 DIAGNOSIS — R030 Elevated blood-pressure reading, without diagnosis of hypertension: Secondary | ICD-10-CM | POA: Diagnosis not present

## 2015-07-11 HISTORY — DX: Pure hypercholesterolemia, unspecified: E78.00

## 2015-07-11 HISTORY — DX: Hypothyroidism, unspecified: E03.9

## 2015-07-11 LAB — URINALYSIS, ROUTINE W REFLEX MICROSCOPIC
Bilirubin Urine: NEGATIVE
GLUCOSE, UA: NEGATIVE mg/dL
Ketones, ur: NEGATIVE mg/dL
NITRITE: NEGATIVE
PH: 8.5 — AB (ref 5.0–8.0)
Protein, ur: NEGATIVE mg/dL
SPECIFIC GRAVITY, URINE: 1.014 (ref 1.005–1.030)
Urobilinogen, UA: 1 mg/dL (ref 0.0–1.0)

## 2015-07-11 LAB — COMPREHENSIVE METABOLIC PANEL
ALBUMIN: 4 g/dL (ref 3.5–5.0)
ALT: 21 U/L (ref 14–54)
ANION GAP: 8 (ref 5–15)
AST: 29 U/L (ref 15–41)
Alkaline Phosphatase: 59 U/L (ref 38–126)
BUN: 13 mg/dL (ref 6–20)
CHLORIDE: 107 mmol/L (ref 101–111)
CO2: 24 mmol/L (ref 22–32)
Calcium: 9.5 mg/dL (ref 8.9–10.3)
Creatinine, Ser: 0.48 mg/dL (ref 0.44–1.00)
GFR calc Af Amer: >60 mL/min (ref >60)
GFR calc non Af Amer: >60 mL/min (ref >60)
GLUCOSE: 123 mg/dL — AB (ref 65–99)
POTASSIUM: 3.9 mmol/L (ref 3.5–5.1)
Sodium: 139 mmol/L (ref 135–145)
Total Bilirubin: 1.4 mg/dL — ABNORMAL HIGH (ref 0.3–1.2)
Total Protein: 6.7 g/dL (ref 6.5–8.1)

## 2015-07-11 LAB — CBC
HEMATOCRIT: 40.9 % (ref 36.0–46.0)
Hemoglobin: 13.8 g/dL (ref 12.0–15.0)
MCH: 31.2 pg (ref 26.0–34.0)
MCHC: 33.7 g/dL (ref 30.0–36.0)
MCV: 92.3 fL (ref 78.0–100.0)
Platelets: 240 10*3/uL (ref 150–400)
RBC: 4.43 MIL/uL (ref 3.87–5.11)
RDW: 13.1 % (ref 11.5–15.5)
WBC: 5.7 10*3/uL (ref 4.0–10.5)

## 2015-07-11 LAB — I-STAT CG4 LACTIC ACID, ED: Lactic Acid, Venous: 0.96 mmol/L (ref 0.5–2.0)

## 2015-07-11 LAB — URINE MICROSCOPIC-ADD ON

## 2015-07-11 LAB — LIPASE, BLOOD: LIPASE: 25 U/L (ref 11–51)

## 2015-07-11 MED ORDER — SODIUM CHLORIDE 0.9 % IV BOLUS (SEPSIS)
1000.0000 mL | Freq: Once | INTRAVENOUS | Status: AC
  Administered 2015-07-11: 1000 mL via INTRAVENOUS

## 2015-07-11 MED ORDER — ONDANSETRON HCL 4 MG/2ML IJ SOLN
4.0000 mg | Freq: Once | INTRAMUSCULAR | Status: AC | PRN
  Administered 2015-07-11: 4 mg via INTRAVENOUS
  Filled 2015-07-11: qty 2

## 2015-07-11 MED ORDER — ONDANSETRON 4 MG PO TBDP: 4.0000 mg | ORAL_TABLET | Freq: Three times a day (TID) | ORAL | Status: DC | PRN

## 2015-07-11 NOTE — ED Notes (Signed)
Pt asking to wait a few minutes for nausea medicine to take affect before she attempts to urinate.

## 2015-07-11 NOTE — ED Notes (Signed)
Pt alert and oriented x4. Respirations even and unlabored, bilateral symmetrical rise and fall of chest. Skin warm and dry. In no acute distress. Denies needs.   

## 2015-07-11 NOTE — ED Notes (Signed)
Per ems pt is from [facility]. Pt reports nausea and vomiting since yesterday, has not been able to keep any fluids down since yesterday. No new acute pain, has chronic back discomfort.

## 2015-07-11 NOTE — Discharge Instructions (Signed)
Return if you are unable to eat or drink or if you develop chest pain or shortness of breath.  Follow up with your primary care physician for reevaluation.  Nausea and Vomiting Nausea is a sick feeling that often comes before throwing up (vomiting). Vomiting is a reflex where stomach contents come out of your mouth. Vomiting can cause severe loss of body fluids (dehydration). Children and elderly adults can become dehydrated quickly, especially if they also have diarrhea. Nausea and vomiting are symptoms of a condition or disease. It is important to find the cause of your symptoms. CAUSES   Direct irritation of the stomach lining. This irritation can result from increased acid production (gastroesophageal reflux disease), infection, food poisoning, taking certain medicines (such as nonsteroidal anti-inflammatory drugs), alcohol use, or tobacco use.  Signals from the brain.These signals could be caused by a headache, heat exposure, an inner ear disturbance, increased pressure in the brain from injury, infection, a tumor, or a concussion, pain, emotional stimulus, or metabolic problems.  An obstruction in the gastrointestinal tract (bowel obstruction).  Illnesses such as diabetes, hepatitis, gallbladder problems, appendicitis, kidney problems, cancer, sepsis, atypical symptoms of a heart attack, or eating disorders.  Medical treatments such as chemotherapy and radiation.  Receiving medicine that makes you sleep (general anesthetic) during surgery. DIAGNOSIS Your caregiver may ask for tests to be done if the problems do not improve after a few days. Tests may also be done if symptoms are severe or if the reason for the nausea and vomiting is not clear. Tests may include:  Urine tests.  Blood tests.  Stool tests.  Cultures (to look for evidence of infection).  X-rays or other imaging studies. Test results can help your caregiver make decisions about treatment or the need for additional  tests. TREATMENT You need to stay well hydrated. Drink frequently but in small amounts.You may wish to drink water, sports drinks, clear broth, or eat frozen ice pops or gelatin dessert to help stay hydrated.When you eat, eating slowly may help prevent nausea.There are also some antinausea medicines that may help prevent nausea. HOME CARE INSTRUCTIONS   Take all medicine as directed by your caregiver.  If you do not have an appetite, do not force yourself to eat. However, you must continue to drink fluids.  If you have an appetite, eat a normal diet unless your caregiver tells you differently.  Eat a variety of complex carbohydrates (rice, wheat, potatoes, bread), lean meats, yogurt, fruits, and vegetables.  Avoid high-fat foods because they are more difficult to digest.  Drink enough water and fluids to keep your urine clear or pale yellow.  If you are dehydrated, ask your caregiver for specific rehydration instructions. Signs of dehydration may include:  Severe thirst.  Dry lips and mouth.  Dizziness.  Dark urine.  Decreasing urine frequency and amount.  Confusion.  Rapid breathing or pulse. SEEK IMMEDIATE MEDICAL CARE IF:   You have blood or brown flecks (like coffee grounds) in your vomit.  You have black or bloody stools.  You have a severe headache or stiff neck.  You are confused.  You have severe abdominal pain.  You have chest pain or trouble breathing.  You do not urinate at least once every 8 hours.  You develop cold or clammy skin.  You continue to vomit for longer than 24 to 48 hours.  You have a fever. MAKE SURE YOU:   Understand these instructions.  Will watch your condition.  Will get help right  away if you are not doing well or get worse.   This information is not intended to replace advice given to you by your health care provider. Make sure you discuss any questions you have with your health care provider.   Document Released:  08/15/2005 Document Revised: 11/07/2011 Document Reviewed: 01/12/2011 Elsevier Interactive Patient Education [employer].

## 2015-07-11 NOTE — ED Notes (Signed)
Pt to xray

## 2015-07-11 NOTE — ED Provider Notes (Signed)
CSN: [id number]     Arrival date & time 07/11/15  [id number] History   First MD Initiated Contact with Patient 07/11/15 [phone]     Chief Complaint  Patient presents with  . Emesis     (Consider location/radiation/quality/duration/timing/severity/associated sxs/prior Treatment) HPI Comments: 81 y.o. Female with history of arthritis, GERD, emphysema, HTN, hypercholesterolemia presents for nausea and vomiting since yesterday morning.  She reports that she has not been able to eat or even hold down water or other liquids.  Denies fever.  Denies chest pain or shortness of breath.  Lives in a group setting and believes others there have had similar symptoms.  No constipation or diarrhea.  Had a normal bowel movement this morning.  Mild cramping abdominal discomfort but no abdominal pain.     Past Medical History  Diagnosis Date  . Allergy   . Arthritis   . Emphysema of lung ([place])   . Cataract   . GERD (gastroesophageal reflux disease)   . Thyroid disease   . Osteoporosis   . Hypertension   . High cholesterol   . Hypothyroidism    Past Surgical History  Procedure Laterality Date  . Cholecystectomy    . Joint replacement    . Abdominal hysterectomy    . Shoulder arthroscopy with rotator cuff repair Right   . Bladder repair    . Laminectomy      L3-L5  . Incontinence surgery    . Total hip arthroplasty      right 1999   History reviewed. No pertinent family history. Social History  Substance Use Topics  . Smoking status: Never Smoker   . Smokeless tobacco: None  . Alcohol Use: No   OB History    No data available     Review of Systems  Constitutional: Positive for appetite change. Negative for fever, chills and fatigue.  HENT: Negative for congestion, postnasal drip and rhinorrhea.   Respiratory: Negative for cough, chest tightness and shortness of breath.   Cardiovascular: Negative for chest pain and palpitations.  Gastrointestinal: Positive for nausea, vomiting and abdominal  pain (mild cramping). Negative for diarrhea and constipation.  Genitourinary: Negative for dysuria, urgency and flank pain.  Musculoskeletal: Negative for myalgias and back pain.  Skin: Negative for rash.  Neurological: Negative for dizziness, weakness, light-headedness and headaches.  Hematological: Does not bruise/bleed easily.      Allergies  Codeine; Macrobid; Miralax; Pain relief; Paxil; Percodan; and Penicillins  Home Medications   Prior to Admission medications   Medication Sig Start Date End Date Taking? Authorizing Provider  Calcium-Phosphorus-Vitamin D (CITRACAL +D3) [id number] MG-MG-UNIT CHEW Chew 1 tablet by mouth daily.   Yes Historical Provider, MD  cetirizine (ZYRTEC) 10 MG tablet Take 10 mg by mouth daily.   Yes Historical Provider, MD  clonazePAM (KLONOPIN) 0.5 MG tablet Take 0.5 mg by mouth 2 (two) times daily as needed for anxiety.   Yes Historical Provider, MD  denosumab (PROLIA) 60 MG/ML SOLN injection Inject 60 mg into the skin every 6 (six) months. Administer in upper arm, thigh, or abdomen   Yes Historical Provider, MD  diltiazem (CARDIZEM CD) 240 MG 24 hr capsule Take 240 mg by mouth daily.  03/30/10  Yes Historical Provider, MD  dorzolamide-timolol (COSOPT) 22.3-6.8 MG/ML ophthalmic solution Place 1 drop into both eyes 2 (two) times daily. 07/07/15  Yes Historical Provider, MD  ipratropium (ATROVENT) 0.06 % nasal spray Place 2 sprays into the nose 4 (four) times daily.   Yes Historical Provider, MD  irbesartan (AVAPRO) 300 MG tablet Take 300 mg by mouth daily.   Yes Historical Provider, MD  Multiple Vitamin (MULTIVITAMIN WITH MINERALS) TABS tablet Take 1 tablet by mouth daily.   Yes Historical Provider, MD  naproxen sodium (ANAPROX) 220 MG tablet Take 220 mg by mouth 2 (two) times daily with a meal.   Yes Historical Provider, MD  pantoprazole (PROTONIX) 40 MG tablet Take 40 mg by mouth daily.   Yes Historical Provider, MD  rosuvastatin (CRESTOR) 10 MG tablet Take 5  mg by mouth at bedtime.    Yes Historical Provider, MD  SYNTHROID 125 MCG tablet Take one tablet by mouth daily. 05/11/15  Yes Historical Provider, MD  traMADol (ULTRAM) 50 MG tablet TK 1/2 TO 1 T PO Q 3 HOURS PRN FOR PAIN. 06/09/15  Yes Historical Provider, MD  tretinoin (RETIN-A) 0.025 % cream Apply topically at bedtime.   Yes Historical Provider, MD  Wheat Dextrin (BENEFIBER DRINK MIX PO) Take by mouth. 2 tablespoons once daily.   Yes Historical Provider, MD  gabapentin (NEURONTIN) 100 MG capsule Take 1 capsule (100 mg total) by mouth at bedtime. Patient not taking: Reported on 07/11/2015 07/01/15   Carlos Levering Draper, DO  ondansetron (ZOFRAN ODT) 4 MG disintegrating tablet Take 1 tablet (4 mg total) by mouth every 8 (eight) hours as needed for nausea or vomiting. 07/11/15   Harvel Quale, MD   BP 143/75 mmHg  Pulse 86  Temp(Src) 98.5 F (36.9 C) (Oral)  Resp 17  SpO2 96% Physical Exam  Constitutional: She is oriented to person, place, and time. She appears well-developed and well-nourished. No distress.  HENT:  Head: Normocephalic and atraumatic.  Right Ear: External ear normal.  Left Ear: External ear normal.  Nose: Nose normal.  Mouth/Throat: Oropharynx is clear and moist. Mucous membranes are dry. No oropharyngeal exudate.  Eyes: EOM are normal. Pupils are equal, round, and reactive to light.  Neck: Normal range of motion. Neck supple.  Cardiovascular: Normal rate, regular rhythm and intact distal pulses.   No murmur heard. Pulmonary/Chest: Effort normal. No respiratory distress. She has no wheezes. She has no rales.  Abdominal: Soft. She exhibits no distension. There is no tenderness.  Musculoskeletal: Normal range of motion. She exhibits no edema or tenderness.  Neurological: She is alert and oriented to person, place, and time.  Skin: Skin is warm and dry. No rash noted. She is not diaphoretic.  Vitals reviewed.   ED Course  Procedures (including critical care time) Labs  Review Labs Reviewed  COMPREHENSIVE METABOLIC PANEL - Abnormal; Notable for the following:    Glucose, Bld 123 (*)    Total Bilirubin 1.4 (*)    All other components within normal limits  URINALYSIS, ROUTINE W REFLEX MICROSCOPIC (NOT AT [facility]) - Abnormal; Notable for the following:    APPearance CLOUDY (*)    pH 8.5 (*)    Hgb urine dipstick TRACE (*)    Leukocytes, UA SMALL (*)    All other components within normal limits  URINE MICROSCOPIC-ADD ON - Abnormal; Notable for the following:    Bacteria, UA FEW (*)    All other components within normal limits  LIPASE, BLOOD  CBC  I-STAT CG4 LACTIC ACID, ED    Imaging Review Dg Abd Acute W/chest  07/11/2015  CLINICAL DATA:  Pt c/o n/v, back pain x 3 days w/diarrhea + weakness, denies discrete abd pain EXAM: DG ABDOMEN ACUTE W/ 1V CHEST COMPARISON:  06/09/2015 FINDINGS: Lungs are mildly hyperinflated.  Heart size is normal. There are no focal consolidations or pleural effusions. No pulmonary edema. Supine and erect views of the abdomen show a nonobstructive bowel gas pattern. Status post right hip arthroplasty. Degenerative changes in the lower spine and left hip. No organomegaly. Pelvic vascular calcifications. IMPRESSION: 1.  No evidence for acute cardiopulmonary abnormality. 2. Nonobstructive bowel gas pattern. Electronically Signed   By: Nolon Nations M.D.   On: 07/11/2015 09:59   I have personally reviewed and evaluated these images and lab results as part of my medical decision-making.   EKG Interpretation   Date/Time:  Saturday July 11 2015 09:23:37 EST Ventricular Rate:  72 PR Interval:  144 QRS Duration: 127 QT Interval:  407 QTC Calculation: 445 R Axis:   -31 Text Interpretation:  Sinus rhythm No significant change since last  tracing Confirmed by Kamauri Kathol ([id number]) on 07/11/2015 9:47:50 AM      MDM  Patient was seen and evaluated in stable condition.  EKG unremarkable and AAS without acute finding.  Not felt to  be anginal equivalent as EKG without ischemic changes and symptoms present since yesterday morning and presentation sounds more GI related.  Laboratory studies including Lactic acid unremarkable.  Symptoms improved with IV fluids, Zofran.  Patient able to tolerate PO and reported feeling well enough for discharge.  Patient discharged in stable condition with strict return precautions and with Zofran for nausea.  She expressed understanding and agreement with plan of care. Final diagnoses:  Nausea and vomiting, vomiting of unspecified type    1. Nausea and vomiting    Harvel Quale, MD 07/12/15 [phone]

## 2015-07-11 NOTE — ED Notes (Signed)
Pt back from x-ray.

## 2015-07-11 NOTE — ED Notes (Signed)
md at bedside

## 2015-07-11 NOTE — ED Notes (Signed)
Pt escorted to discharge window. Pt verbalized understanding discharge instructions. In no acute distress.  

## 2015-07-11 NOTE — ED Notes (Signed)
Bed: [id number] Expected date:  Expected time:  Means of arrival:  Comments: EMS/elderly vomiting

## 2015-07-14 ENCOUNTER — Encounter: Payer: Self-pay | Admitting: Internal Medicine

## 2015-07-14 ENCOUNTER — Non-Acute Institutional Stay (SKILLED_NURSING_FACILITY): Payer: Medicare Other | Admitting: Internal Medicine

## 2015-07-14 DIAGNOSIS — R638 Other symptoms and signs concerning food and fluid intake: Secondary | ICD-10-CM | POA: Diagnosis not present

## 2015-07-14 DIAGNOSIS — A084 Viral intestinal infection, unspecified: Secondary | ICD-10-CM | POA: Diagnosis not present

## 2015-07-14 DIAGNOSIS — K5901 Slow transit constipation: Secondary | ICD-10-CM

## 2015-07-14 DIAGNOSIS — M5441 Lumbago with sciatica, right side: Secondary | ICD-10-CM | POA: Diagnosis not present

## 2015-07-14 DIAGNOSIS — G5701 Lesion of sciatic nerve, right lower limb: Secondary | ICD-10-CM | POA: Diagnosis not present

## 2015-07-14 DIAGNOSIS — M4806 Spinal stenosis, lumbar region: Secondary | ICD-10-CM | POA: Diagnosis not present

## 2015-07-14 DIAGNOSIS — M5136 Other intervertebral disc degeneration, lumbar region: Secondary | ICD-10-CM | POA: Diagnosis not present

## 2015-07-14 DIAGNOSIS — R260 Ataxic gait: Secondary | ICD-10-CM | POA: Diagnosis not present

## 2015-07-14 DIAGNOSIS — M5431 Sciatica, right side: Secondary | ICD-10-CM | POA: Diagnosis not present

## 2015-07-14 DIAGNOSIS — R293 Abnormal posture: Secondary | ICD-10-CM | POA: Diagnosis not present

## 2015-07-14 NOTE — Progress Notes (Signed)
Patient ID: Destiny [name], female   DOB: September 17, 1927, 81 y.o.   MRN: [id number]  Provider:  Rexene Edison. Mariea Clonts, D.O., C.M.D. Location:  [facility] PCP: Horatio Pel, MD  Goals of Care: Advanced Directive information Does patient have an advance directive?: Yes   Chief Complaint  Patient presents with  . New Admit To SNF    Rehab for nausea and vomiting. Went to ER 07/12/15 with nausea and vomiting    HPI: Patient is a 81 y.o. female seen today for admission to [facility] s/p ED visit 07/12/15 with nausea and vomiting.  She has not been feeling well and was mildly hypertensive and afebrile at ED.  She received labs, imaging and fluids, zofran and an ekg which was all unremarkable.  She has been constipated.  When seen, she is more concerned currently about her back pain and has no recollection of being told that she had piriformis syndrome by the sports medicine physicians.  She has been unable to take care of herself at home due to the nausea, vomiting and her back pain.  Her UA, lipase, cbc, cmp, abdominal film with CXR, and ekg were all normal.  She is being treated with milk of mag for her constipation at her request and won't take other suggested remedies.    She notes her last loose bm was on Friday.  Her vomiting began Friday, but her intake was poor since last thurs.    She has a right hip prosthesis/  She had  Cortisone injection in her right hp and has been to the chiropractor which was not helpful.  4 years ago she had back surgery for the right leg pain which was transiently better, but now she has pain in the right groin and down her leg with her calf tingling.  No position is comfortable.  She sleeps with a pillow between her knees.  She had been going to water aerobics three times weekly and doing some walking before it got this bad.  She is now here for some PT.    Review of Systems  Constitutional: Negative for fever, chills and malaise/fatigue.  HENT:  Negative for congestion.   Eyes: Negative for blurred vision.  Respiratory: Negative for cough and shortness of breath.   Cardiovascular: Negative for chest pain and leg swelling.  Gastrointestinal: Positive for nausea, vomiting, abdominal pain, diarrhea and constipation. Negative for blood in stool and melena.  Genitourinary: Negative for dysuria, urgency and frequency.  Musculoskeletal: Positive for back pain. Negative for myalgias and falls.  Skin: Negative for rash.  Neurological: Positive for tingling and sensory change. Negative for dizziness, loss of consciousness and weakness.  Endo/Heme/Allergies: Does not bruise/bleed easily.  Psychiatric/Behavioral: Negative for memory loss.    Past Medical History  Diagnosis Date  . Allergy   . Arthritis   . Emphysema of lung ([place])   . Cataract   . GERD (gastroesophageal reflux disease)   . Thyroid disease   . Osteoporosis   . Hypertension   . High cholesterol   . Hypothyroidism   . Insomnia   . Chronic anxiety   . Spinal stenosis   . Varicose veins   . Constipation   . Glaucoma   . Lumbar degenerative disc disease   . Diverticulosis of sigmoid colon   . Palpitations   . LBBB (left bundle branch block)   . H/O adenomatous polyp of colon     Dr. Cristina Gong  . Onychomycosis   . 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 Cholecystectomy    . Joint replacement    . Abdominal hysterectomy    . Shoulder arthroscopy with rotator cuff repair Right 08/2003  . Bladder repair    . Laminectomy  02/2011    L3-L5  Dr. Ellene Route  . Incontinence surgery    . Total hip arthroplasty      right 1999  . Colonoscopy  05/18/2012  . Cataract extraction w/ intraocular lens  implant, bilateral  7 & 8 2014  . Blephoplasty bilateral  03/10/2014    reports that she has never smoked. She has never used smokeless tobacco. She reports that she does not  drink alcohol or use illicit drugs. History reviewed. No pertinent family history.  Allergies  Allergen Reactions  . Codeine Nausea Only  . Macrobid [Nitrofurantoin] Diarrhea  . Miralax [Polyethylene Glycol] Other (See Comments)    cramping  . Pain Relief [Acetaminophen] Nausea And Vomiting  . Paxil [Paroxetine Hcl] Nausea Only  . Percodan [Oxycodone-Aspirin] Itching  . Penicillins Rash   Social History   Social History  . Marital Status: Widowed    Spouse Name: N/A  . Number of Children: N/A  . Years of Education: N/A   Occupational History  . Not on file.   Social History Main Topics  . Smoking status: Never Smoker   . Smokeless tobacco: Never Used  . Alcohol Use: No  . Drug Use: No  . Sexual Activity: No   Other Topics Concern  . Not on file   Social History Narrative   Lives at [facility]   Caffeine 1-2 cups daily   Exercise water aerobics, walking   Never smoked   POA   History reviewed. No pertinent family history.    Medication List       This list is accurate as of: 07/14/15 11:59 PM.  Always use your most recent med list.               BENEFIBER DRINK MIX PO  Take by mouth. 2 tablespoons once daily.     cetirizine 10 MG tablet  Commonly known as:  ZYRTEC  Take 10 mg by mouth. Take one tablet daily as needed     CITRACAL +D3 250-107-500 MG-MG-UNIT Chew  Generic drug:  Calcium-Phosphorus-Vitamin D  Chew 1 tablet by mouth daily.     clonazePAM 0.5 MG tablet  Commonly known as:  KLONOPIN  Take 0.5 mg by mouth 2 (two) times daily as needed for anxiety.     diltiazem 240 MG 24 hr capsule  Commonly known as:  CARDIZEM CD  Take 240 mg by mouth daily.     dorzolamide-timolol 22.3-6.8 MG/ML ophthalmic solution  Commonly known as:  COSOPT  Place 1 drop into both eyes 2 (two) times daily.     gabapentin 100 MG capsule  Commonly known as:  NEURONTIN  Take 1 capsule (100 mg total) by mouth at bedtime.     irbesartan 300 MG tablet   Commonly known as:  AVAPRO  Take 300 mg by mouth daily.     ondansetron 4 MG disintegrating tablet  Commonly known as:  ZOFRAN ODT  Take 1 tablet (4 mg total) by mouth every 8 (eight) hours as needed for nausea or vomiting.     pantoprazole 40 MG tablet  Commonly known as:  PROTONIX  Take 40 mg  by mouth daily.     PROLIA 60 MG/ML Soln injection  Generic drug:  denosumab  Inject 60 mg into the skin every 6 (six) months. Administer in upper arm, thigh, or abdomen     rosuvastatin 10 MG tablet  Commonly known as:  CRESTOR  Take 5 mg by mouth at bedtime.     SYNTHROID 125 MCG tablet  Generic drug:  levothyroxine  Take one tablet by mouth daily.     traMADol 50 MG tablet  Commonly known as:  ULTRAM  one every 6 hours as needed for pain        Physical Exam: Filed Vitals:   07/14/15 1025  BP: 107/62  Pulse: 76  Temp: 98.1 F (36.7 C)  Resp: 20  Height: 5\' 3"  (1.6 m)  Weight: 126 lb 3.2 oz (57.244 kg)  SpO2: 96%   Body mass index is 22.36 kg/(m^2). Physical Exam  Constitutional: She is oriented to person, place, and time. She appears well-developed and well-nourished. No distress.  HENT:  Head: Normocephalic and atraumatic.  Right Ear: External ear normal.  Left Ear: External ear normal.  Nose: Nose normal.  Mouth/Throat: Oropharynx is clear and moist. No oropharyngeal exudate.  Eyes: Conjunctivae and EOM are normal. Pupils are equal, round, and reactive to light.  Neck: Normal range of motion. Neck supple. No JVD present.  Cardiovascular: Normal rate, regular rhythm, normal heart sounds and intact distal pulses.   Pulmonary/Chest: Effort normal and breath sounds normal. No respiratory distress.  Abdominal: Soft. Bowel sounds are normal. She exhibits no distension and no mass. There is no tenderness. There is no rebound and no guarding.  Musculoskeletal: Normal range of motion. She exhibits tenderness.  Right groin, SI region, lower lumbar paravertebral muscles   Lymphadenopathy:    She has no cervical adenopathy.  Neurological: She is alert and oriented to person, place, and time.  Skin: Skin is warm and dry.    Labs reviewed: Basic Metabolic Panel:  Recent Labs  07/11/15 0912 07/16/15  NA 139 139  K 3.9 3.7  CL 107  --   CO2 24  --   GLUCOSE 123*  --   BUN 13 14  CREATININE 0.48 0.6  CALCIUM 9.5  --    Liver Function Tests:  Recent Labs  07/11/15 0912 07/16/15  AST 29 14  ALT 21 12  ALKPHOS 59 54  BILITOT 1.4*  --   PROT 6.7  --   ALBUMIN 4.0  --     Recent Labs  07/11/15 0912  LIPASE 25   No results for input(s): AMMONIA in the last 8760 hours. CBC:  Recent Labs  07/11/15 0912  WBC 5.7  HGB 13.8  HCT 40.9  MCV 92.3  PLT 240   Cardiac Enzymes: No results for input(s): CKTOTAL, CKMB, CKMBINDEX, TROPONINI in the last 8760 hours. BNP: Invalid input(s): POCBNP No results found for: HGBA1C No results found for: TSH No results found for: VITAMINB12 No results found for: FOLATE No results found for: IRON, TIBC, FERRITIN No urine culture was done with UA.  Imaging and Procedures obtained prior to SNF admission: 07/12/15:  EKG:  NSR at 72 with LBBB 07/11/15:  abd with chest:  No acute cardiopulmonary abn, nonobstructive bowel gas pattern.  S/p right hip arthroplasty.  Degenerative changes in lower spine and left hip.  Pelvic vascular calcifications.  No organomegaly.  Assessment/Plan 1. Viral gastroenteritis -seems this has resolved a few days back -intake still poor, but improving--needs encouragement  2. Poor fluid intake -cont to advance diet, prn zofran if needed -f/u labs and if ok and intake back to normal, could go home and continue rehab outpatient   3. DDD (degenerative disc disease), lumbar -bigger problem for her now -cont tramadol for pain and PT  4. Piriformis syndrome of right side -will benefit most from therapy for this at this point -notes it did help significantly for several months  in the past, but it's recurred and is worse this time -cont tramadol also -must remain active and stretch regularly  5.  Slow transit constipation -agrees to MOM today to help bowels move--probably aren't just b/c of tramadol and minimal intake  Functional status:  Requiring some assist with bathing and dressing due to weakness and pain  Family/ staff Communication: discussed with pt, rehab nurse and nurse manager  Labs/tests ordered:  Bmp, liver panel, amylase, lipase  Jian Hodgman L. Lovely Kerins, D.O. [facility] [address] [address], [place] [address] Cell Phone (Mon-Fri 8am-5pm):  [phone] On Call:  [phone] & follow prompts after 5pm & weekends Office Phone:  [phone] Office Fax:  [phone]

## 2015-07-15 DIAGNOSIS — M5441 Lumbago with sciatica, right side: Secondary | ICD-10-CM | POA: Diagnosis not present

## 2015-07-15 DIAGNOSIS — R293 Abnormal posture: Secondary | ICD-10-CM | POA: Diagnosis not present

## 2015-07-15 DIAGNOSIS — R260 Ataxic gait: Secondary | ICD-10-CM | POA: Diagnosis not present

## 2015-07-15 DIAGNOSIS — M4806 Spinal stenosis, lumbar region: Secondary | ICD-10-CM | POA: Diagnosis not present

## 2015-07-15 DIAGNOSIS — M5431 Sciatica, right side: Secondary | ICD-10-CM | POA: Diagnosis not present

## 2015-07-16 LAB — BASIC METABOLIC PANEL
BUN: 14 mg/dL (ref 4–21)
CREATININE: 0.6 mg/dL (ref 0.5–1.1)
Glucose: 104 mg/dL
POTASSIUM: 3.7 mmol/L (ref 3.4–5.3)
Sodium: 139 mmol/L (ref 137–147)

## 2015-07-16 LAB — HEPATIC FUNCTION PANEL
ALK PHOS: 54 U/L (ref 25–125)
ALT: 12 U/L (ref 7–35)
AST: 14 U/L (ref 13–35)
Bilirubin, Total: 0.9 mg/dL

## 2015-07-17 ENCOUNTER — Encounter: Payer: Self-pay | Admitting: Adult Health

## 2015-07-17 ENCOUNTER — Non-Acute Institutional Stay (SKILLED_NURSING_FACILITY): Payer: Medicare Other | Admitting: Adult Health

## 2015-07-17 DIAGNOSIS — M810 Age-related osteoporosis without current pathological fracture: Secondary | ICD-10-CM

## 2015-07-17 DIAGNOSIS — K5900 Constipation, unspecified: Secondary | ICD-10-CM | POA: Insufficient documentation

## 2015-07-17 DIAGNOSIS — E039 Hypothyroidism, unspecified: Secondary | ICD-10-CM | POA: Diagnosis not present

## 2015-07-17 DIAGNOSIS — M4806 Spinal stenosis, lumbar region: Secondary | ICD-10-CM | POA: Diagnosis not present

## 2015-07-17 DIAGNOSIS — I10 Essential (primary) hypertension: Secondary | ICD-10-CM | POA: Diagnosis not present

## 2015-07-17 DIAGNOSIS — F411 Generalized anxiety disorder: Secondary | ICD-10-CM

## 2015-07-17 DIAGNOSIS — E785 Hyperlipidemia, unspecified: Secondary | ICD-10-CM | POA: Diagnosis not present

## 2015-07-17 DIAGNOSIS — J449 Chronic obstructive pulmonary disease, unspecified: Secondary | ICD-10-CM

## 2015-07-17 DIAGNOSIS — R112 Nausea with vomiting, unspecified: Secondary | ICD-10-CM

## 2015-07-17 DIAGNOSIS — R293 Abnormal posture: Secondary | ICD-10-CM | POA: Diagnosis not present

## 2015-07-17 DIAGNOSIS — G5700 Lesion of sciatic nerve, unspecified lower limb: Secondary | ICD-10-CM | POA: Insufficient documentation

## 2015-07-17 DIAGNOSIS — R260 Ataxic gait: Secondary | ICD-10-CM | POA: Diagnosis not present

## 2015-07-17 DIAGNOSIS — K5901 Slow transit constipation: Secondary | ICD-10-CM | POA: Diagnosis not present

## 2015-07-17 DIAGNOSIS — G5701 Lesion of sciatic nerve, right lower limb: Secondary | ICD-10-CM | POA: Diagnosis not present

## 2015-07-17 DIAGNOSIS — M5441 Lumbago with sciatica, right side: Secondary | ICD-10-CM | POA: Diagnosis not present

## 2015-07-17 DIAGNOSIS — M5431 Sciatica, right side: Secondary | ICD-10-CM | POA: Diagnosis not present

## 2015-07-17 HISTORY — DX: Essential (primary) hypertension: I10

## 2015-07-17 NOTE — Progress Notes (Signed)
Patient ID: Destiny [name], female   DOB: 1928/07/06, 81 y.o.   MRN: [id number]    Nursing Home Location:  [place]   Place of Service: SNF [phone])  Patient Care Team: Deland Pretty, MD as PCP - General (Internal Medicine) Deland Pretty, MD (Internal Medicine) Ronald Lobo, MD as Consulting Physician (Gastroenterology) Jari Pigg, MD as Consulting Physician (Dermatology) Shon Hough, MD as Consulting Physician (Ophthalmology)  PCP: Horatio Pel, MD    Chief Complaint  Patient presents with  . Discharge Note    HPI:  Patient is a 81 y.o. female seen today at [facility] in the rehab section to evaluate for discharge. She was admitted on 07/13/15 due to weakness, nausea, and vomiting. She was seen in the ER on 11/12 for these symptoms but if was felt that this was due to gastroenteritis and therefore self limiting.   She has a hx of piriformis syndrome and was seen on 11/2 by Dr. Micheline Chapman with sports medicine. She was started on neurontin at that time. She has right sided low back pain with radiculopathy to the mid calf area. She has reported no change in pain with neurontin at this point. She is also using prn ultram for pain.  She has a remote hx of lumbar laminectomy and right hip arthroplasty.  She has seen ortho and there were no issues with her hip. In 2012 she as noted to have spinal stenosis at the L4-L5 region on CT scan.   She reports constipation with hard stools during her stay. She had a BM yesterday but feels like she may need something long term for constipation. Her symptoms of n, v have subsided and her WBC, amylase, lipase, and lfts were normal. She is requesting to return home and f/u out pt with the sports medicine clinic.  She is already receiving PT and will continue this at home.   Review of Systems:  Review of Systems  Constitutional: Positive for activity change. Negative for fever, chills, diaphoresis and  fatigue.  HENT: Negative for congestion.   Respiratory: Negative for cough and shortness of breath.   Cardiovascular: Negative for chest pain and leg swelling.  Gastrointestinal: Positive for constipation. Negative for nausea, vomiting, abdominal pain, diarrhea, blood in stool and abdominal distention.  Genitourinary: Negative for dysuria and flank pain.  Musculoskeletal: Positive for back pain, arthralgias and gait problem. Negative for myalgias, joint swelling and neck pain.  Neurological: Positive for numbness (tingling on the right leg radiatin to mid calf area). Negative for dizziness, facial asymmetry, light-headedness and headaches.    Past Medical History  Diagnosis Date  . Allergy   . Arthritis   . Emphysema of lung ([place])   . Cataract   . GERD (gastroesophageal reflux disease)   . Thyroid disease   . Osteoporosis   . Hypertension   . High cholesterol   . Hypothyroidism   . Insomnia   . Chronic anxiety   . Spinal stenosis   . Varicose veins   . Constipation   . Glaucoma   . Lumbar degenerative disc disease   . Diverticulosis of sigmoid colon   . Palpitations   . LBBB (left bundle branch block)   . H/O adenomatous polyp of colon     Dr. Cristina Gong  . Onychomycosis   . 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 Cholecystectomy    .  Joint replacement    . Abdominal hysterectomy    . Shoulder arthroscopy with rotator cuff repair Right 08/2003  . Bladder repair    . Laminectomy  02/2011    L3-L5  Dr. Ellene Route  . Incontinence surgery    . Total hip arthroplasty      right 1999  . Colonoscopy  05/18/2012  . Cataract extraction w/ intraocular lens  implant, bilateral  7 & 8 2014  . Blephoplasty bilateral  03/10/2014   Social History:   reports that she has never smoked. She has never used smokeless tobacco. She reports that she does not drink alcohol or use illicit  drugs.  History reviewed. No pertinent family history.  Medications: Patient's Medications  New Prescriptions   No medications on file  Previous Medications   CALCIUM-PHOSPHORUS-VITAMIN D (CITRACAL +D3) 250-107-500 MG-MG-UNIT CHEW    Chew 1 tablet by mouth daily.   CETIRIZINE (ZYRTEC) 10 MG TABLET    Take 10 mg by mouth. Take one tablet daily as needed   CLONAZEPAM (KLONOPIN) 0.5 MG TABLET    Take 0.5 mg by mouth 2 (two) times daily as needed for anxiety.   DENOSUMAB (PROLIA) 60 MG/ML SOLN INJECTION    Inject 60 mg into the skin every 6 (six) months. Administer in upper arm, thigh, or abdomen   DILTIAZEM (CARDIZEM CD) 240 MG 24 HR CAPSULE    Take 240 mg by mouth daily.    DORZOLAMIDE-TIMOLOL (COSOPT) 22.3-6.8 MG/ML OPHTHALMIC SOLUTION    Place 1 drop into both eyes 2 (two) times daily.   GABAPENTIN (NEURONTIN) 100 MG CAPSULE    Take 1 capsule (100 mg total) by mouth at bedtime.   IRBESARTAN (AVAPRO) 300 MG TABLET    Take 300 mg by mouth daily.   ONDANSETRON (ZOFRAN ODT) 4 MG DISINTEGRATING TABLET    Take 1 tablet (4 mg total) by mouth every 8 (eight) hours as needed for nausea or vomiting.   PANTOPRAZOLE (PROTONIX) 40 MG TABLET    Take 40 mg by mouth daily.   ROSUVASTATIN (CRESTOR) 10 MG TABLET    Take 5 mg by mouth at bedtime.    SYNTHROID 125 MCG TABLET    Take one tablet by mouth daily.   TRAMADOL (ULTRAM) 50 MG TABLET    one every 6 hours as needed for pain   WHEAT DEXTRIN (BENEFIBER DRINK MIX PO)    Take by mouth. 2 tablespoons once daily.  Modified Medications   No medications on file  Discontinued Medications   No medications on file     Physical Exam: Filed Vitals:   07/17/15 1047  BP: 134/70  Pulse: 78  Temp: 97.7 F (36.5 C)  Resp: 18  SpO2: 94%    Physical Exam  Constitutional: No distress.  Neck: No JVD present.  Cardiovascular: Normal rate and regular rhythm.   No murmur heard. Pulmonary/Chest: Effort normal and breath sounds normal. No respiratory distress.   Abdominal: Soft. Bowel sounds are normal. She exhibits no distension. There is no tenderness.  Musculoskeletal: She exhibits no edema or tenderness.  Neurological: She is alert.  Skin: Skin is warm and dry. She is not diaphoretic.  Psychiatric: She has a normal mood and affect.    Labs reviewed: Basic Metabolic Panel:  Recent Labs  07/11/15 0912 07/16/15  NA 139 139  K 3.9 3.7  CL 107  --   CO2 24  --   GLUCOSE 123*  --   BUN 13 14  CREATININE 0.48 0.6  CALCIUM 9.5  --    Liver Function Tests:  Recent Labs  07/11/15 0912 07/16/15  AST 29 14  ALT 21 12  ALKPHOS 59 54  BILITOT 1.4*  --   PROT 6.7  --   ALBUMIN 4.0  --     Recent Labs  07/11/15 0912  LIPASE 25   No results for input(s): AMMONIA in the last 8760 hours. CBC:  Recent Labs  07/11/15 0912  WBC 5.7  HGB 13.8  HCT 40.9  MCV 92.3  PLT 240   TSH: No results for input(s): TSH in the last 8760 hours. A1C: No results found for: HGBA1C Lipid Panel: No results for input(s): CHOL, HDL, LDLCALC, TRIG, CHOLHDL, LDLDIRECT in the last 8760 hours.  Radiological Exams: ABD xray 07/11/15 FINDINGS: Lungs are mildly hyperinflated. Heart size is normal. There are no focal consolidations or pleural effusions. No pulmonary edema.   Supine and erect views of the abdomen show a nonobstructive bowel gas pattern. Status post right hip arthroplasty. Degenerative changes in the lower spine and left hip. No organomegaly. Pelvic vascular calcifications.   IMPRESSION: 1.  No evidence for acute cardiopulmonary abnormality. 2. Nonobstructive bowel gas pattern. CT of the lumbar spine 01/19/2011 Findings: Good opacification lumbar subarachnoid space. Significant spinal stenosis is observed at L3-4, and L4-5.  There is insufficient contrast present to evaluate L5-S1.  There is no visible compression fracture or subluxation.  On the lateral view with the patient recumbent on the table there is grossly  anatomic alignment.  Standing films were not obtained.   IMPRESSION: As above.   CT MYELOGRAPHY LUMBAR SPINE   Technique: CT imaging of the lumbar spine was performed after intrathecal contrast administration.  Multiplanar CT image reconstructions were also generated.   Findings:  No prevertebral or paraspinous masses. Nonaneurysmal atheromatous change of the aorta is present.   L1-2: Mild facet arthropathy. No disc protrusion or spinal stenosis.  No compressive lesion.   L2-3:  Mild facet arthropathy.  No disc protrusion or spinal stenosis.   L3-4: 3 mm of facet mediated anterolisthesis L3 on L4. Broad-based central disc protrusion.  Posterior element hypertrophy is present affecting facets and ligamentum flava.  Severe central canal stenosis is present with bilateral L4 nerve root encroachment. Bilateral neural foraminal narrowing potentially affects both L3 nerve roots.   L4-5: 2 mm of facet mediated anterolisthesis.  Broad-based central disc protrusion.  Advanced posterior element hypertrophy affects facets and ligamentum flava.  Severe central canal stenosis is present with bilateral L5 nerve root encroachment.   Bilateral neural foraminal narrowing affects both L4 nerve roots as well slightly worse on the right.   L5-S1: Mild bulge.  Moderate facet arthropathy.  No compressive lesion.   IMPRESSION: Two level spinal stenosis as described at L3-4 and L4-5.  Assessment/Plan  1. Non-intractable vomiting with nausea, vomiting of unspecified type -resolved  2. Piriformis syndrome, right -increase neurontin to BID  -continue prn ultram -follow up with sports medicine -continue PT at home -consider prednisone taper out pt if symptoms continue -also consider neuro surgery evaluation and MRI symptoms persists (hx of spinal stenosis)  3. Slow transit constipation -improved but not resolved -continue Benefiber -increase water and fiber intake -add colace 100 mg  daily and may increase to BID if issues continue  4. Hypothyroidism, unspecified hypothyroidism type -followed by Dr. Shelia Media with [facility]  5. HLD (hyperlipidemia) -on crestor, followed by Dr. Shelia Media  6. Osteoporosis -continue prolia  7. Chronic obstructive pulmonary disease, unspecified COPD  type ([place]) -not on meds no symptoms at present  8. Anxiety state -continue prn clonazepam  9. Essential hypertension -controlled with cartia and avapro  Pt is ready for discharge. She should follow up out pt with sports medicine regarding her symptoms.   I spent >30 min on this discharge.  Cindi Carbon, ANP [facility] [phone]

## 2015-07-20 DIAGNOSIS — M4806 Spinal stenosis, lumbar region: Secondary | ICD-10-CM | POA: Diagnosis not present

## 2015-07-20 DIAGNOSIS — M5441 Lumbago with sciatica, right side: Secondary | ICD-10-CM | POA: Diagnosis not present

## 2015-07-20 DIAGNOSIS — R260 Ataxic gait: Secondary | ICD-10-CM | POA: Diagnosis not present

## 2015-07-20 DIAGNOSIS — R293 Abnormal posture: Secondary | ICD-10-CM | POA: Diagnosis not present

## 2015-07-20 DIAGNOSIS — M5431 Sciatica, right side: Secondary | ICD-10-CM | POA: Diagnosis not present

## 2015-07-22 DIAGNOSIS — R260 Ataxic gait: Secondary | ICD-10-CM | POA: Diagnosis not present

## 2015-07-22 DIAGNOSIS — M4806 Spinal stenosis, lumbar region: Secondary | ICD-10-CM | POA: Diagnosis not present

## 2015-07-22 DIAGNOSIS — M5441 Lumbago with sciatica, right side: Secondary | ICD-10-CM | POA: Diagnosis not present

## 2015-07-22 DIAGNOSIS — R293 Abnormal posture: Secondary | ICD-10-CM | POA: Diagnosis not present

## 2015-07-22 DIAGNOSIS — M5431 Sciatica, right side: Secondary | ICD-10-CM | POA: Diagnosis not present

## 2015-07-27 ENCOUNTER — Other Ambulatory Visit: Payer: Self-pay | Admitting: Internal Medicine

## 2015-07-27 DIAGNOSIS — M4807 Spinal stenosis, lumbosacral region: Secondary | ICD-10-CM | POA: Diagnosis not present

## 2015-07-27 DIAGNOSIS — M48061 Spinal stenosis, lumbar region without neurogenic claudication: Secondary | ICD-10-CM

## 2015-07-27 DIAGNOSIS — M5441 Lumbago with sciatica, right side: Secondary | ICD-10-CM | POA: Diagnosis not present

## 2015-07-28 ENCOUNTER — Encounter: Payer: Self-pay | Admitting: Adult Health

## 2015-07-30 DIAGNOSIS — R260 Ataxic gait: Secondary | ICD-10-CM | POA: Diagnosis not present

## 2015-07-30 DIAGNOSIS — M4806 Spinal stenosis, lumbar region: Secondary | ICD-10-CM | POA: Diagnosis not present

## 2015-07-30 DIAGNOSIS — M5431 Sciatica, right side: Secondary | ICD-10-CM | POA: Diagnosis not present

## 2015-07-30 DIAGNOSIS — M5441 Lumbago with sciatica, right side: Secondary | ICD-10-CM | POA: Diagnosis not present

## 2015-07-30 DIAGNOSIS — R293 Abnormal posture: Secondary | ICD-10-CM | POA: Diagnosis not present

## 2015-08-03 ENCOUNTER — Encounter: Payer: Self-pay | Admitting: Sports Medicine

## 2015-08-03 ENCOUNTER — Ambulatory Visit (INDEPENDENT_AMBULATORY_CARE_PROVIDER_SITE_OTHER): Payer: Medicare Other | Admitting: Sports Medicine

## 2015-08-03 VITALS — BP 122/70 | Ht 63.0 in | Wt 130.0 lb

## 2015-08-03 DIAGNOSIS — M5441 Lumbago with sciatica, right side: Secondary | ICD-10-CM

## 2015-08-03 MED ORDER — PREDNISONE 10 MG PO TABS: 10.0000 mg | ORAL_TABLET | Freq: Every day | ORAL | Status: DC

## 2015-08-03 MED ORDER — METHYLPREDNISOLONE ACETATE 80 MG/ML IJ SUSP
80.0000 mg | Freq: Once | INTRAMUSCULAR | Status: AC
  Administered 2015-08-03: 80 mg via INTRAMUSCULAR

## 2015-08-03 NOTE — Progress Notes (Signed)
   Subjective:    Patient ID: Destiny [name], female    DOB: [birth year], 81 y.o.   MRN: [id number]  HPI Mrs. Fruehauf is an 81yo female presenting today for follow up of right piriformis syndrome with sciatica.  - Last seen in office on 07/01/15 for same complaint.  - Reports worsening of pain since last visit. - Pain located over right buttock and occasionally extending down right leg to medial and lateral lower leg.  - Pain present since September 2016 - Pain worse with weight bearing and with lying supine. - Notes occasional numbness and tingling of right leg, but none at this time. - Pain has been making it difficult for her to sleep at night. Has been sleeping in recliner.  - Has been using walker to ambulate - Discharge from PT. States they told her there was nothing else they could do. - Discontinued Gabapentin a few weeks ago.  - Denies use of OTC medications for pain - Recently seen by Dr. Shelia Media for same complaint. Was prescribed Tramadol 50-100mg  TID, which has had minimal effect. MRI scheduled for 08/13/15. Referral to neurosurgery, Dr. Ellene Route, with appointment scheduled for 08/26/15. - History of lumbar laminectomy with decompression of L4 nerve root in 2012 - Reports right knee and right hip pain unchanged from last visit  Review of Systems Per HPI    Objective:   Physical Exam  General: 81yo female resting comfortably in no apparent distress Cardiac: lower extremities warm and well perfused Resp: no increased work of breathing noted. Neuro: Sensation intact in lower extremities bilaterally. 1/4 right patellar reflex, 2/4 left patellar reflex.  Musc: Decreased but symmetric external rotation ROM of hips bilaterally. Negative FABER on left, positive FABER on right. Negative straight leg raise. Crepitus of knees bilaterally. Muscle strength 5/5 with knee and hip flexion and extension. Tenderness and tightness of right piriformis noted. Limp noted with ambulation. No midline  thoracic or lumbar tenderness. No SI tenderness.      Assessment & Plan:  # 1: Low back pain likely secondary to lumbar degenerative disc disease with right leg radiculopathy  Last seen for same complaint on 07/01/15 and referred to PT given resolution of symptoms in past with PT. Was also prescribed Gabapentin at that visit, however patient has discontinued this. Patient reports worsening since last visit. Recently seen by Dr. Shelia Media, who prescribed Tramadol, scheduled MRI, and referred to Neurosurgery. - 80mg  Depomedrol IM today. Initiate Prednisone dose pack on 08/04/15 and taper over six days. - MRI as scheduled on December 15 - Follow up with neurosurgery, Dr. Ellene Route, as scheduled on December 28 - Continue Tramadol as needed for pain - Follow up PRN.   Patient seen and evaluated with the resident. I agree with the plan of care. Her pain is quite uncomfortable so we have elected to inject her with 80 mg of Depo-Medrol IM and start a 6 day Sterapred Dosepak beginning tomorrow. Hopefully this will make her symptoms more tolerable until her MRI on December 15 after which she should see Dr. Ellene Route for further treatment.

## 2015-08-13 ENCOUNTER — Ambulatory Visit
Admission: RE | Admit: 2015-08-13 | Discharge: 2015-08-13 | Disposition: A | Discharge status: Home / Self Care | Payer: Medicare Other | Source: Ambulatory Visit | Attending: Internal Medicine | Admitting: Internal Medicine

## 2015-08-13 DIAGNOSIS — M48061 Spinal stenosis, lumbar region without neurogenic claudication: Secondary | ICD-10-CM

## 2015-08-13 DIAGNOSIS — M4806 Spinal stenosis, lumbar region: Secondary | ICD-10-CM | POA: Diagnosis not present

## 2015-08-26 DIAGNOSIS — M5126 Other intervertebral disc displacement, lumbar region: Secondary | ICD-10-CM | POA: Diagnosis not present

## 2015-08-27 ENCOUNTER — Other Ambulatory Visit: Payer: Self-pay | Admitting: Neurological Surgery

## 2015-09-02 DIAGNOSIS — E039 Hypothyroidism, unspecified: Secondary | ICD-10-CM | POA: Diagnosis not present

## 2015-09-02 DIAGNOSIS — E7800 Pure hypercholesterolemia, unspecified: Secondary | ICD-10-CM | POA: Diagnosis not present

## 2015-09-02 DIAGNOSIS — I10 Essential (primary) hypertension: Secondary | ICD-10-CM | POA: Diagnosis not present

## 2015-09-02 DIAGNOSIS — Z0000 Encounter for general adult medical examination without abnormal findings: Secondary | ICD-10-CM | POA: Diagnosis not present

## 2015-09-02 LAB — CBC AND DIFFERENTIAL
HEMATOCRIT: 38 % (ref 36–46)
HEMOGLOBIN: 12.8 g/dL (ref 12.0–16.0)
Platelets: 299 10*3/uL (ref 150–399)
WBC: 6.6 10^3/mL

## 2015-09-02 LAB — LIPID PANEL
CHOLESTEROL: 175 mg/dL (ref 0–200)
HDL: 71 mg/dL — AB (ref 35–70)
LDL Cholesterol: 79 mg/dL
LDL/HDL RATIO: 1.1
TRIGLYCERIDES: 126 mg/dL (ref 40–160)

## 2015-09-02 LAB — HEPATIC FUNCTION PANEL
ALT: 16 U/L (ref 7–35)
AST: 14 U/L (ref 13–35)
Bilirubin, Total: 0.4 mg/dL

## 2015-09-02 LAB — BASIC METABOLIC PANEL
BUN: 16 mg/dL (ref 4–21)
Creatinine: 0.6 mg/dL (ref 0.5–1.1)
Glucose: 98 mg/dL
POTASSIUM: 4.1 mmol/L (ref 3.4–5.3)
SODIUM: 141 mmol/L (ref 137–147)

## 2015-09-02 LAB — TSH: TSH: 0.7 u[IU]/mL (ref 0.41–5.90)

## 2015-09-03 DIAGNOSIS — I10 Essential (primary) hypertension: Secondary | ICD-10-CM | POA: Diagnosis not present

## 2015-09-03 DIAGNOSIS — E7800 Pure hypercholesterolemia, unspecified: Secondary | ICD-10-CM | POA: Diagnosis not present

## 2015-09-03 DIAGNOSIS — Z0000 Encounter for general adult medical examination without abnormal findings: Secondary | ICD-10-CM | POA: Diagnosis not present

## 2015-09-03 DIAGNOSIS — E039 Hypothyroidism, unspecified: Secondary | ICD-10-CM | POA: Diagnosis not present

## 2015-09-11 DIAGNOSIS — I10 Essential (primary) hypertension: Secondary | ICD-10-CM | POA: Diagnosis not present

## 2015-09-11 DIAGNOSIS — E7800 Pure hypercholesterolemia, unspecified: Secondary | ICD-10-CM | POA: Diagnosis not present

## 2015-09-11 DIAGNOSIS — R9431 Abnormal electrocardiogram [ECG] [EKG]: Secondary | ICD-10-CM | POA: Diagnosis not present

## 2015-09-11 DIAGNOSIS — Z01810 Encounter for preprocedural cardiovascular examination: Secondary | ICD-10-CM | POA: Diagnosis not present

## 2015-09-14 ENCOUNTER — Encounter (HOSPITAL_COMMUNITY)
Admission: RE | Admit: 2015-09-14 | Discharge: 2015-09-14 | Disposition: A | Discharge status: Home / Self Care | Payer: Medicare Other | Source: Ambulatory Visit | Attending: Neurological Surgery | Admitting: Neurological Surgery

## 2015-09-14 ENCOUNTER — Encounter (HOSPITAL_COMMUNITY): Payer: Self-pay

## 2015-09-14 DIAGNOSIS — Z79899 Other long term (current) drug therapy: Secondary | ICD-10-CM | POA: Diagnosis not present

## 2015-09-14 DIAGNOSIS — I444 Left anterior fascicular block: Secondary | ICD-10-CM | POA: Insufficient documentation

## 2015-09-14 DIAGNOSIS — J439 Emphysema, unspecified: Secondary | ICD-10-CM | POA: Diagnosis not present

## 2015-09-14 DIAGNOSIS — R9431 Abnormal electrocardiogram [ECG] [EKG]: Secondary | ICD-10-CM | POA: Insufficient documentation

## 2015-09-14 DIAGNOSIS — E785 Hyperlipidemia, unspecified: Secondary | ICD-10-CM | POA: Diagnosis not present

## 2015-09-14 DIAGNOSIS — Z01812 Encounter for preprocedural laboratory examination: Secondary | ICD-10-CM | POA: Insufficient documentation

## 2015-09-14 DIAGNOSIS — H409 Unspecified glaucoma: Secondary | ICD-10-CM | POA: Diagnosis not present

## 2015-09-14 DIAGNOSIS — M5126 Other intervertebral disc displacement, lumbar region: Secondary | ICD-10-CM | POA: Insufficient documentation

## 2015-09-14 DIAGNOSIS — E039 Hypothyroidism, unspecified: Secondary | ICD-10-CM | POA: Diagnosis not present

## 2015-09-14 DIAGNOSIS — K219 Gastro-esophageal reflux disease without esophagitis: Secondary | ICD-10-CM | POA: Insufficient documentation

## 2015-09-14 DIAGNOSIS — Z01818 Encounter for other preprocedural examination: Secondary | ICD-10-CM | POA: Diagnosis not present

## 2015-09-14 DIAGNOSIS — I10 Essential (primary) hypertension: Secondary | ICD-10-CM | POA: Insufficient documentation

## 2015-09-14 DIAGNOSIS — Z01810 Encounter for preprocedural cardiovascular examination: Secondary | ICD-10-CM | POA: Diagnosis not present

## 2015-09-14 LAB — CBC
HCT: 40.2 % (ref 36.0–46.0)
Hemoglobin: 13.2 g/dL (ref 12.0–15.0)
MCH: 31.7 pg (ref 26.0–34.0)
MCHC: 32.8 g/dL (ref 30.0–36.0)
MCV: 96.6 fL (ref 78.0–100.0)
PLATELETS: 262 10*3/uL (ref 150–400)
RBC: 4.16 MIL/uL (ref 3.87–5.11)
RDW: 13.8 % (ref 11.5–15.5)
WBC: 10.3 10*3/uL (ref 4.0–10.5)

## 2015-09-14 LAB — BASIC METABOLIC PANEL
Anion gap: 8 (ref 5–15)
BUN: 17 mg/dL (ref 6–20)
CO2: 26 mmol/L (ref 22–32)
CREATININE: 0.61 mg/dL (ref 0.44–1.00)
Calcium: 9.4 mg/dL (ref 8.9–10.3)
Chloride: 107 mmol/L (ref 101–111)
GFR calc Af Amer: >60 mL/min (ref >60)
GLUCOSE: 149 mg/dL — AB (ref 65–99)
POTASSIUM: 3.3 mmol/L — AB (ref 3.5–5.1)
SODIUM: 141 mmol/L (ref 135–145)

## 2015-09-14 LAB — SURGICAL PCR SCREEN
MRSA, PCR: NEGATIVE
Staphylococcus aureus: NEGATIVE

## 2015-09-14 NOTE — Progress Notes (Signed)
Pt went to see Dr Einar Gip today. He did stess test and echo is scheduled for Wed.   Will request and F/U wih allison Z

## 2015-09-14 NOTE — Pre-Procedure Instructions (Signed)
    Destiny [name]  09/14/2015      [id number] - [place], [place] - [address] AT [address] [place] [address] Phone: [phone] Fax: [phone]    Your procedure is scheduled on 09/21/15.  Report to [facility] Admitting at 930 A.M.  Call this number if you have problems the morning of surgery:  [phone]   Remember:  Do not eat food or drink liquids after midnight.  Take these medicines the morning of surgery with A SIP OF WATER --tylenol,cardizem,eye drops,protonix,synthroid,ultram   Do not wear jewelry, make-up or nail polish.  Do not wear lotions, powders, or perfumes.  You may wear deodorant.  Do not shave 48 hours prior to surgery.  Men may shave face and neck.  Do not bring valuables to the hospital.  [facility] is not responsible for any belongings or valuables.  Contacts, dentures or bridgework may not be worn into surgery.  Leave your suitcase in the car.  After surgery it may be brought to your room.  For patients admitted to the hospital, discharge time will be determined by your treatment team.  Patients discharged the day of surgery will not be allowed to drive home.   Name and phone number of your driver:    Special instructions:    Please read over the following fact sheets that you were given. Pain Booklet and Coughing and Deep Breathing

## 2015-09-15 NOTE — Progress Notes (Addendum)
Anesthesia Chart Review:  Pt is 82 year old female scheduled for L3-4 microdiskectomy on 09/21/2015 with Dr. Ellene Route.   PMH includes:  HTN, LBBB, hyperlipidemia, palpitations, emphysema, glaucoma, hypothyroidism, GERD. Never smoker. BMI 23.   Medications include: diltiazem, cosopt ophthalmic, irbesartan-hctz, protonix, crestor, synthroid, timolol  Preoperative labs reviewed.    EKG 09/14/15: NSR. Possible Left atrial enlargement. Cannot rule out Anteroseptal infarct, age undetermined. ST & T wave abnormality, consider lateral ischemia. Left anterior fasicular block.   Pt has seen Selinda Michaels, NP in Dr. Irven Shelling office for pre-op eval. Had stress test 09/14/15 and is scheduled for echo 09/16/15. Will revisit chart when test results available.   Willeen Cass, FNP-BC [facility] Short Stay Surgical Center/Anesthesiology Phone: [phone] 09/15/2015 2:47 PM  Addendum:  09/14/15 Lexiscan stress test was documented as "NORMAL/LOW RISK." Full report requested, but is still pending.  09/16/15 Echo: Conclusion: LV cavity is normal in size. Normal global wall motion. Visual EF approximately 55%. Doppler evidence of grade I (impaired) diastolic dysfunction with elevated LV filling pressure. LA cavity is borderline dilated. Mild MR. Moderate calcification of the mitral valve annulus. Mild MV leaflet calcification. Slightly restricted mitral valve leaflets. Mild to moderate TR. No evidence of pulmonary hypertension.  Subsequently, Neldon Labella, NP with [facility] felt patient was low risk for perioperative CV complication.  Nursing staff can follow-up on copy of stress test report on the day of surgery.   [facility] Short Stay Center/Anesthesiology Phone [phone] 09/18/2015 6:29 PM

## 2015-09-16 DIAGNOSIS — Z01810 Encounter for preprocedural cardiovascular examination: Secondary | ICD-10-CM | POA: Diagnosis not present

## 2015-09-16 DIAGNOSIS — I10 Essential (primary) hypertension: Secondary | ICD-10-CM | POA: Diagnosis not present

## 2015-09-16 DIAGNOSIS — R9431 Abnormal electrocardiogram [ECG] [EKG]: Secondary | ICD-10-CM | POA: Diagnosis not present

## 2015-09-17 NOTE — Progress Notes (Signed)
Spoke with Dr Irven Shelling office and requested echo results.  Pt had it done yesterday around noon and they have not had it read yet.  Message to be sent to Dr Einar Gip to expedite the reading and fax over the results, but the secretary says it usually takes them a week.

## 2015-09-20 MED ORDER — VANCOMYCIN HCL IN DEXTROSE 1-5 GM/200ML-% IV SOLN
1000.0000 mg | INTRAVENOUS | Status: AC
  Administered 2015-09-21: 1000 mg via INTRAVENOUS
  Filled 2015-09-20: qty 200

## 2015-09-21 ENCOUNTER — Ambulatory Visit (HOSPITAL_COMMUNITY): Payer: Medicare Other | Admitting: Emergency Medicine

## 2015-09-21 ENCOUNTER — Inpatient Hospital Stay (HOSPITAL_COMMUNITY)
Admission: RE | Admit: 2015-09-21 | Discharge: 2015-09-22 | DRG: 520 | Disposition: A | Discharge status: Home / Self Care | Payer: Medicare Other | Source: Ambulatory Visit | Attending: Neurological Surgery | Admitting: Neurological Surgery

## 2015-09-21 ENCOUNTER — Encounter (HOSPITAL_COMMUNITY)
Admission: RE | Disposition: A | Discharge status: Home / Self Care | Payer: Self-pay | Source: Ambulatory Visit | Attending: Neurological Surgery

## 2015-09-21 ENCOUNTER — Encounter (HOSPITAL_COMMUNITY): Payer: Self-pay

## 2015-09-21 ENCOUNTER — Ambulatory Visit (HOSPITAL_COMMUNITY): Payer: Medicare Other

## 2015-09-21 ENCOUNTER — Ambulatory Visit (HOSPITAL_COMMUNITY): Payer: Medicare Other | Admitting: Anesthesiology

## 2015-09-21 DIAGNOSIS — F419 Anxiety disorder, unspecified: Secondary | ICD-10-CM | POA: Diagnosis present

## 2015-09-21 DIAGNOSIS — J449 Chronic obstructive pulmonary disease, unspecified: Secondary | ICD-10-CM | POA: Diagnosis present

## 2015-09-21 DIAGNOSIS — M5126 Other intervertebral disc displacement, lumbar region: Secondary | ICD-10-CM | POA: Diagnosis not present

## 2015-09-21 DIAGNOSIS — E039 Hypothyroidism, unspecified: Secondary | ICD-10-CM | POA: Diagnosis present

## 2015-09-21 DIAGNOSIS — M1990 Unspecified osteoarthritis, unspecified site: Secondary | ICD-10-CM | POA: Diagnosis not present

## 2015-09-21 DIAGNOSIS — M545 Low back pain: Secondary | ICD-10-CM | POA: Diagnosis not present

## 2015-09-21 DIAGNOSIS — M5116 Intervertebral disc disorders with radiculopathy, lumbar region: Principal | ICD-10-CM | POA: Diagnosis present

## 2015-09-21 DIAGNOSIS — M4316 Spondylolisthesis, lumbar region: Secondary | ICD-10-CM | POA: Diagnosis present

## 2015-09-21 DIAGNOSIS — K219 Gastro-esophageal reflux disease without esophagitis: Secondary | ICD-10-CM | POA: Diagnosis present

## 2015-09-21 DIAGNOSIS — Z79899 Other long term (current) drug therapy: Secondary | ICD-10-CM | POA: Diagnosis not present

## 2015-09-21 DIAGNOSIS — Z419 Encounter for procedure for purposes other than remedying health state, unspecified: Secondary | ICD-10-CM

## 2015-09-21 DIAGNOSIS — I10 Essential (primary) hypertension: Secondary | ICD-10-CM | POA: Diagnosis present

## 2015-09-21 DIAGNOSIS — H409 Unspecified glaucoma: Secondary | ICD-10-CM | POA: Diagnosis present

## 2015-09-21 DIAGNOSIS — M5136 Other intervertebral disc degeneration, lumbar region: Secondary | ICD-10-CM | POA: Diagnosis not present

## 2015-09-21 HISTORY — PX: LUMBAR LAMINECTOMY/DECOMPRESSION MICRODISCECTOMY: SHX5026

## 2015-09-21 SURGERY — LUMBAR LAMINECTOMY/DECOMPRESSION MICRODISCECTOMY 1 LEVEL
Anesthesia: General | Site: Back | Laterality: Right

## 2015-09-21 MED ORDER — CLONAZEPAM 0.5 MG PO TABS: 0.2500 mg | ORAL_TABLET | Freq: Two times a day (BID) | ORAL | Status: DC | PRN

## 2015-09-21 MED ORDER — SENNA 8.6 MG PO TABS
1.0000 | ORAL_TABLET | Freq: Two times a day (BID) | ORAL | Status: DC
  Administered 2015-09-21 – 2015-09-22 (×2): 8.6 mg via ORAL
  Filled 2015-09-21 (×2): qty 1

## 2015-09-21 MED ORDER — SUCCINYLCHOLINE CHLORIDE 20 MG/ML IJ SOLN
INTRAMUSCULAR | Status: AC
  Filled 2015-09-21: qty 1

## 2015-09-21 MED ORDER — SUGAMMADEX SODIUM 200 MG/2ML IV SOLN
INTRAVENOUS | Status: DC | PRN
  Administered 2015-09-21: 200 mg via INTRAVENOUS

## 2015-09-21 MED ORDER — LACTATED RINGERS IV SOLN
INTRAVENOUS | Status: DC
  Administered 2015-09-21 (×2): via INTRAVENOUS

## 2015-09-21 MED ORDER — OXYCODONE-ACETAMINOPHEN 5-325 MG PO TABS: 1.0000 | ORAL_TABLET | ORAL | Status: DC | PRN

## 2015-09-21 MED ORDER — DORZOLAMIDE HCL-TIMOLOL MAL 2-0.5 % OP SOLN
1.0000 [drp] | Freq: Two times a day (BID) | OPHTHALMIC | Status: DC
  Administered 2015-09-21: 1 [drp] via OPHTHALMIC
  Filled 2015-09-21: qty 10

## 2015-09-21 MED ORDER — IRBESARTAN 300 MG PO TABS
300.0000 mg | ORAL_TABLET | Freq: Every day | ORAL | Status: DC
  Administered 2015-09-22: 300 mg via ORAL
  Filled 2015-09-21: qty 1

## 2015-09-21 MED ORDER — BENEFIBER DRINK MIX PO PACK: PACK | ORAL | Status: DC

## 2015-09-21 MED ORDER — BUPIVACAINE HCL (PF) 0.5 % IJ SOLN
INTRAMUSCULAR | Status: DC | PRN
  Administered 2015-09-21: 20 mL
  Administered 2015-09-21: 8 mL

## 2015-09-21 MED ORDER — BISACODYL 10 MG RE SUPP: 10.0000 mg | Freq: Every day | RECTAL | Status: DC | PRN

## 2015-09-21 MED ORDER — FENTANYL CITRATE (PF) 250 MCG/5ML IJ SOLN
INTRAMUSCULAR | Status: AC
  Filled 2015-09-21: qty 5

## 2015-09-21 MED ORDER — TRAMADOL HCL 50 MG PO TABS
50.0000 mg | ORAL_TABLET | Freq: Four times a day (QID) | ORAL | Status: DC | PRN
  Administered 2015-09-21: 50 mg via ORAL
  Filled 2015-09-21: qty 1

## 2015-09-21 MED ORDER — LACTATED RINGERS IV SOLN: INTRAVENOUS | Status: DC

## 2015-09-21 MED ORDER — FENTANYL CITRATE (PF) 100 MCG/2ML IJ SOLN
INTRAMUSCULAR | Status: DC | PRN
  Administered 2015-09-21: 100 ug via INTRAVENOUS
  Administered 2015-09-21: 50 ug via INTRAVENOUS

## 2015-09-21 MED ORDER — METHOCARBAMOL 1000 MG/10ML IJ SOLN
500.0000 mg | Freq: Four times a day (QID) | INTRAVENOUS | Status: DC | PRN
  Filled 2015-09-21: qty 5

## 2015-09-21 MED ORDER — TIMOLOL HEMIHYDRATE 0.5 % OP SOLN
1.0000 [drp] | Freq: Two times a day (BID) | OPHTHALMIC | Status: DC
  Administered 2015-09-21: 1 [drp] via OPHTHALMIC
  Filled 2015-09-21: qty 5

## 2015-09-21 MED ORDER — DILTIAZEM HCL ER COATED BEADS 120 MG PO CP24
240.0000 mg | ORAL_CAPSULE | Freq: Every day | ORAL | Status: DC
  Administered 2015-09-22: 240 mg via ORAL
  Filled 2015-09-21: qty 2

## 2015-09-21 MED ORDER — FLEET ENEMA 7-19 GM/118ML RE ENEM
1.0000 | ENEMA | Freq: Once | RECTAL | Status: DC | PRN
Start: 2015-09-21 — End: 2015-09-22

## 2015-09-21 MED ORDER — LIDOCAINE HCL (CARDIAC) 20 MG/ML IV SOLN
INTRAVENOUS | Status: AC
  Filled 2015-09-21: qty 5

## 2015-09-21 MED ORDER — ALUM & MAG HYDROXIDE-SIMETH 200-200-20 MG/5ML PO SUSP: 30.0000 mL | Freq: Four times a day (QID) | ORAL | Status: DC | PRN

## 2015-09-21 MED ORDER — HYDROMORPHONE HCL 1 MG/ML IJ SOLN: 0.2500 mg | INTRAMUSCULAR | Status: DC | PRN

## 2015-09-21 MED ORDER — HYDROMORPHONE HCL 1 MG/ML IJ SOLN: 0.5000 mg | INTRAMUSCULAR | Status: DC | PRN

## 2015-09-21 MED ORDER — HYDROCHLOROTHIAZIDE 12.5 MG PO CAPS
12.5000 mg | ORAL_CAPSULE | Freq: Every day | ORAL | Status: DC
  Administered 2015-09-22: 12.5 mg via ORAL
  Filled 2015-09-21: qty 1

## 2015-09-21 MED ORDER — IRBESARTAN-HYDROCHLOROTHIAZIDE 300-12.5 MG PO TABS: 1.0000 | ORAL_TABLET | Freq: Every day | ORAL | Status: DC

## 2015-09-21 MED ORDER — MENTHOL 3 MG MT LOZG
1.0000 | LOZENGE | OROMUCOSAL | Status: DC | PRN
  Filled 2015-09-21: qty 9

## 2015-09-21 MED ORDER — LIDOCAINE HCL (CARDIAC) 20 MG/ML IV SOLN
INTRAVENOUS | Status: DC | PRN
  Administered 2015-09-21: 60 mg via INTRAVENOUS

## 2015-09-21 MED ORDER — ONDANSETRON HCL 4 MG/2ML IJ SOLN: 4.0000 mg | INTRAMUSCULAR | Status: DC | PRN

## 2015-09-21 MED ORDER — THROMBIN 5000 UNITS EX SOLR
CUTANEOUS | Status: DC | PRN
  Administered 2015-09-21 (×2): 5000 [IU] via TOPICAL

## 2015-09-21 MED ORDER — PSYLLIUM 95 % PO PACK
1.0000 | PACK | Freq: Every day | ORAL | Status: DC
  Filled 2015-09-21: qty 1

## 2015-09-21 MED ORDER — GLYCOPYRROLATE 0.2 MG/ML IJ SOLN
INTRAMUSCULAR | Status: AC
  Filled 2015-09-21: qty 3

## 2015-09-21 MED ORDER — PHENYLEPHRINE HCL 10 MG/ML IJ SOLN
10.0000 mg | INTRAVENOUS | Status: DC | PRN
  Administered 2015-09-21: 10 ug/min via INTRAVENOUS

## 2015-09-21 MED ORDER — SODIUM CHLORIDE 0.9 % IJ SOLN: 3.0000 mL | INTRAMUSCULAR | Status: DC | PRN

## 2015-09-21 MED ORDER — PHENYLEPHRINE 40 MCG/ML (10ML) SYRINGE FOR IV PUSH (FOR BLOOD PRESSURE SUPPORT)
PREFILLED_SYRINGE | INTRAVENOUS | Status: AC
  Filled 2015-09-21: qty 10

## 2015-09-21 MED ORDER — ONDANSETRON HCL 4 MG/2ML IJ SOLN
INTRAMUSCULAR | Status: DC | PRN
  Administered 2015-09-21: 4 mg via INTRAVENOUS

## 2015-09-21 MED ORDER — SODIUM CHLORIDE 0.9 % IJ SOLN
3.0000 mL | Freq: Two times a day (BID) | INTRAMUSCULAR | Status: DC
  Administered 2015-09-21: 3 mL via INTRAVENOUS

## 2015-09-21 MED ORDER — LORATADINE 10 MG PO TABS: 10.0000 mg | ORAL_TABLET | Freq: Every day | ORAL | Status: DC | PRN

## 2015-09-21 MED ORDER — HYDROCODONE-ACETAMINOPHEN 5-325 MG PO TABS: 1.0000 | ORAL_TABLET | ORAL | Status: DC | PRN

## 2015-09-21 MED ORDER — ROSUVASTATIN CALCIUM 5 MG PO TABS
5.0000 mg | ORAL_TABLET | Freq: Every day | ORAL | Status: DC
  Administered 2015-09-21: 5 mg via ORAL
  Filled 2015-09-21 (×2): qty 1

## 2015-09-21 MED ORDER — ROCURONIUM BROMIDE 50 MG/5ML IV SOLN
INTRAVENOUS | Status: AC
  Filled 2015-09-21: qty 1

## 2015-09-21 MED ORDER — ONDANSETRON HCL 4 MG/2ML IJ SOLN
INTRAMUSCULAR | Status: AC
  Filled 2015-09-21: qty 2

## 2015-09-21 MED ORDER — PROPOFOL 10 MG/ML IV BOLUS
INTRAVENOUS | Status: AC
  Filled 2015-09-21: qty 20

## 2015-09-21 MED ORDER — SODIUM CHLORIDE 0.9 % IR SOLN
Status: DC | PRN
  Administered 2015-09-21: 500 mL

## 2015-09-21 MED ORDER — ROCURONIUM BROMIDE 100 MG/10ML IV SOLN
INTRAVENOUS | Status: DC | PRN
  Administered 2015-09-21: 50 mg via INTRAVENOUS

## 2015-09-21 MED ORDER — LIDOCAINE-EPINEPHRINE 1 %-1:100000 IJ SOLN
INTRAMUSCULAR | Status: DC | PRN
  Administered 2015-09-21: 8 mL

## 2015-09-21 MED ORDER — METHOCARBAMOL 500 MG PO TABS
500.0000 mg | ORAL_TABLET | Freq: Four times a day (QID) | ORAL | Status: DC | PRN
  Administered 2015-09-21: 500 mg via ORAL
  Filled 2015-09-21: qty 1

## 2015-09-21 MED ORDER — KETOROLAC TROMETHAMINE 15 MG/ML IJ SOLN
15.0000 mg | Freq: Four times a day (QID) | INTRAMUSCULAR | Status: DC
  Administered 2015-09-21 – 2015-09-22 (×3): 15 mg via INTRAVENOUS
  Filled 2015-09-21 (×3): qty 1

## 2015-09-21 MED ORDER — PANTOPRAZOLE SODIUM 40 MG PO TBEC
40.0000 mg | DELAYED_RELEASE_TABLET | Freq: Every day | ORAL | Status: DC
  Administered 2015-09-22: 40 mg via ORAL
  Filled 2015-09-21: qty 1

## 2015-09-21 MED ORDER — 0.9 % SODIUM CHLORIDE (POUR BTL) OPTIME
TOPICAL | Status: DC | PRN
  Administered 2015-09-21: 1000 mL

## 2015-09-21 MED ORDER — SUGAMMADEX SODIUM 200 MG/2ML IV SOLN
INTRAVENOUS | Status: AC
  Filled 2015-09-21: qty 2

## 2015-09-21 MED ORDER — PHENOL 1.4 % MT LIQD: 1.0000 | OROMUCOSAL | Status: DC | PRN

## 2015-09-21 MED ORDER — PROPOFOL 10 MG/ML IV BOLUS
INTRAVENOUS | Status: DC | PRN
  Administered 2015-09-21: 100 mg via INTRAVENOUS

## 2015-09-21 MED ORDER — TRETINOIN 0.025 % EX CREA: 1.0000 "application " | TOPICAL_CREAM | Freq: Every day | CUTANEOUS | Status: DC

## 2015-09-21 MED ORDER — LEVOTHYROXINE SODIUM 125 MCG PO TABS
125.0000 ug | ORAL_TABLET | Freq: Every day | ORAL | Status: DC
  Administered 2015-09-22: 125 ug via ORAL
  Filled 2015-09-21 (×2): qty 1

## 2015-09-21 MED ORDER — HEMOSTATIC AGENTS (NO CHARGE) OPTIME
TOPICAL | Status: DC | PRN
  Administered 2015-09-21: 1 via TOPICAL

## 2015-09-21 SURGICAL SUPPLY — 58 items
ADH SKN CLS APL DERMABOND .7 (GAUZE/BANDAGES/DRESSINGS) ×1
BAG DECANTER FOR FLEXI CONT (MISCELLANEOUS) ×3 IMPLANT
BLADE CLIPPER SURG (BLADE) ×2 IMPLANT
BUR ACORN 6.0 (BURR) IMPLANT
BUR ACORN 6.0MM (BURR)
BUR MATCHSTICK NEURO 3.0 LAGG (BURR) ×3 IMPLANT
CANISTER SUCT 3000ML PPV (MISCELLANEOUS) ×3 IMPLANT
DECANTER SPIKE VIAL GLASS SM (MISCELLANEOUS) ×1 IMPLANT
DERMABOND ADVANCED (GAUZE/BANDAGES/DRESSINGS) ×2
DERMABOND ADVANCED .7 DNX12 (GAUZE/BANDAGES/DRESSINGS) ×1 IMPLANT
DRAPE LAPAROTOMY T 102X78X121 (DRAPES) ×3 IMPLANT
DRAPE MICROSCOPE LEICA (MISCELLANEOUS) ×2 IMPLANT
DRAPE POUCH INSTRU U-SHP 10X18 (DRAPES) ×3 IMPLANT
DRAPE PROXIMA HALF (DRAPES) IMPLANT
DRSG OPSITE POSTOP 3X4 (GAUZE/BANDAGES/DRESSINGS) ×2 IMPLANT
DURAPREP 26ML APPLICATOR (WOUND CARE) ×3 IMPLANT
ELECT REM PT RETURN 9FT ADLT (ELECTROSURGICAL) ×3
ELECTRODE REM PT RTRN 9FT ADLT (ELECTROSURGICAL) ×1 IMPLANT
GAUZE SPONGE 4X4 12PLY STRL (GAUZE/BANDAGES/DRESSINGS) ×1 IMPLANT
GAUZE SPONGE 4X4 16PLY XRAY LF (GAUZE/BANDAGES/DRESSINGS) IMPLANT
GLOVE BIO SURGEON STRL SZ 6.5 (GLOVE) ×2 IMPLANT
GLOVE BIO SURGEON STRL SZ8 (GLOVE) ×2 IMPLANT
GLOVE BIO SURGEONS STRL SZ 6.5 (GLOVE) ×2
GLOVE BIOGEL PI IND STRL 6.5 (GLOVE) IMPLANT
GLOVE BIOGEL PI IND STRL 7.0 (GLOVE) IMPLANT
GLOVE BIOGEL PI IND STRL 8.5 (GLOVE) ×1 IMPLANT
GLOVE BIOGEL PI INDICATOR 6.5 (GLOVE) ×2
GLOVE BIOGEL PI INDICATOR 7.0 (GLOVE) ×8
GLOVE BIOGEL PI INDICATOR 8.5 (GLOVE) ×2
GLOVE ECLIPSE 8.5 STRL (GLOVE) ×3 IMPLANT
GLOVE EXAM NITRILE LRG STRL (GLOVE) IMPLANT
GLOVE EXAM NITRILE MD LF STRL (GLOVE) IMPLANT
GLOVE EXAM NITRILE XL STR (GLOVE) IMPLANT
GLOVE EXAM NITRILE XS STR PU (GLOVE) IMPLANT
GLOVE INDICATOR 8.5 STRL (GLOVE) ×2 IMPLANT
GOWN STRL REUS W/ TWL LRG LVL3 (GOWN DISPOSABLE) IMPLANT
GOWN STRL REUS W/ TWL XL LVL3 (GOWN DISPOSABLE) IMPLANT
GOWN STRL REUS W/TWL 2XL LVL3 (GOWN DISPOSABLE) ×3 IMPLANT
GOWN STRL REUS W/TWL LRG LVL3 (GOWN DISPOSABLE) ×9
GOWN STRL REUS W/TWL XL LVL3 (GOWN DISPOSABLE) ×3
KIT BASIN OR (CUSTOM PROCEDURE TRAY) ×3 IMPLANT
KIT ROOM TURNOVER OR (KITS) ×3 IMPLANT
NDL SPNL 20GX3.5 QUINCKE YW (NEEDLE) IMPLANT
NEEDLE HYPO 22GX1.5 SAFETY (NEEDLE) ×3 IMPLANT
NEEDLE SPNL 20GX3.5 QUINCKE YW (NEEDLE) ×3 IMPLANT
NS IRRIG 1000ML POUR BTL (IV SOLUTION) ×3 IMPLANT
PACK LAMINECTOMY NEURO (CUSTOM PROCEDURE TRAY) ×3 IMPLANT
PAD ARMBOARD 7.5X6 YLW CONV (MISCELLANEOUS) ×9 IMPLANT
PATTIES SURGICAL .5 X1 (DISPOSABLE) ×1 IMPLANT
RUBBERBAND STERILE (MISCELLANEOUS) ×4 IMPLANT
SPONGE SURGIFOAM ABS GEL SZ50 (HEMOSTASIS) ×3 IMPLANT
SUT VIC AB 1 CT1 18XBRD ANBCTR (SUTURE) ×1 IMPLANT
SUT VIC AB 1 CT1 8-18 (SUTURE) ×3
SUT VIC AB 2-0 CP2 18 (SUTURE) ×3 IMPLANT
SUT VIC AB 3-0 SH 8-18 (SUTURE) ×3 IMPLANT
TOWEL OR 17X24 6PK STRL BLUE (TOWEL DISPOSABLE) ×3 IMPLANT
TOWEL OR 17X26 10 PK STRL BLUE (TOWEL DISPOSABLE) ×3 IMPLANT
WATER STERILE IRR 1000ML POUR (IV SOLUTION) ×3 IMPLANT

## 2015-09-21 NOTE — Op Note (Signed)
Date of surgery: 09/21/2015 Preoperative diagnosis: Herniated nucleus pulposus L3-4 right with right lumbar radiculopathy Postoperative diagnosis: Herniated nucleus pulposus L3-4 right with right lumbar radiculopathy Procedure: Right L3-4 microdiscectomy with operating microscope and microdissection technique Surgeon: Kristeen Miss First Asst.: Kary Kos M.D. Anesthesia: Gen. endotracheal Indications: Destiny [name] is an 82 year old individual who's had significant back and right lower extremity pain and weakness she has evidence of a large extruded fragment of disc at L3-L4 and the right side she's been advised regarding surgical decompression of this lesion.  Procedure: The patient was brought to the operating room supine on a stretcher. After the smooth induction of general endotracheal anesthesia, she was turned prone. The back was prepped with alcohol and DuraPrep and draped in a sterile fashion. A midline incision was created and L3-L4 was identified positively. Then a laminotomy was created at L3-L4 and the right side over the area just above the disc space. This was in the region of the pars and the lateral portion of the pars of L3 was removed in order to expose the lateral edge of the common dural tube just above the disc space. This exposure were able to localize the common dural tube and the path of the L3 nerve root superiorly and a mass of disc. This was visualized under the operating microscope where once the laminotomy was created microscope was brought in for better inspection. The dural tube was carefully protected and the discs mass was incised. Then a series of fragments of disc were removed and using a number of different ball-tipped probes pieces of disc were retrieved from under the L3 nerve root superiorly and under the common root dural tube inferiorly. The area was clean until all the mass was relieved and then the common dural tube was noted to be much more relaxed. The disc  space at L3-L4 was identified and entered and several fragments of disc were removed from within. Once the decompression was completed, hemostasis was carefully achieved. Microscope was then removed, the retractor was removed, and the lumbar dorsal fascia was closed with #1 Vicryls, 20 Vicryls used in the subcutaneous tissues, and 30 Vicryls subcuticularly. 20 mL of half percent Marcaine was left in the paraspinous fascia. Blood loss was nil.

## 2015-09-21 NOTE — Progress Notes (Signed)
Patient ID: Destiny [name], female   DOB: 26-Jan-1928, 82 y.o.   MRN: [id number] Awake and alert Motor function appears good in lower extremities Patient notes relief of right leg pain this early Stable postop

## 2015-09-21 NOTE — Progress Notes (Signed)
Call to Dr. Landry Dyke with anesth., he states he does want the stress test if its available.  Call to Mendel Ryder in [place]  [facility] ) office, they will fax now.

## 2015-09-21 NOTE — Anesthesia Procedure Notes (Signed)
Procedure Name: Intubation Date/Time: 09/21/2015 11:08 AM Performed by: Lavell Luster Pre-anesthesia Checklist: Patient identified, Emergency Drugs available, Suction available, Patient being monitored and Timeout performed Patient Re-evaluated:Patient Re-evaluated prior to inductionOxygen Delivery Method: Circle system utilized Preoxygenation: Pre-oxygenation with 100% oxygen Intubation Type: IV induction Ventilation: Mask ventilation without difficulty Laryngoscope Size: Mac and 3 Grade View: Grade I Tube type: Oral Tube size: 7.0 mm Number of attempts: 1 Airway Equipment and Method: Stylet Placement Confirmation: ETT inserted through vocal cords under direct vision,  positive ETCO2 and breath sounds checked- equal and bilateral Secured at: 21 cm Tube secured with: Tape Dental Injury: Teeth and Oropharynx as per pre-operative assessment

## 2015-09-21 NOTE — Anesthesia Postprocedure Evaluation (Signed)
Anesthesia Post Note  Patient: Destiny [name]  Procedure(s) Performed: Procedure(s) (LRB): Right Lumbar Three-FourMicrodiskectomy (Right)  Patient location during evaluation: PACU Anesthesia Type: General Level of consciousness: awake and alert Pain management: pain level controlled Vital Signs Assessment: post-procedure vital signs reviewed and stable Respiratory status: spontaneous breathing, nonlabored ventilation, respiratory function stable and patient connected to nasal cannula oxygen Cardiovascular status: blood pressure returned to baseline and stable Postop Assessment: no signs of nausea or vomiting Anesthetic complications: no    Last Vitals:  Filed Vitals:   09/21/15 1334 09/21/15 1402  BP: 130/64 142/59  Pulse: 67 67  Temp: 36.4 C 36.7 C  Resp: 18 16    Last Pain: There were no vitals filed for this visit.               Sherman Donaldson L

## 2015-09-21 NOTE — Transfer of Care (Signed)
Immediate Anesthesia Transfer of Care Note  Patient: Destiny [name]  Procedure(s) Performed: Procedure(s) with comments: Right Lumbar Three-FourMicrodiskectomy (Right) - Right L3-4 Microdiskectomy  Patient Location: PACU  Anesthesia Type:General  Level of Consciousness: awake, alert  and oriented  Airway & Oxygen Therapy: Patient Spontanous Breathing  Post-op Assessment: Post -op Vital signs reviewed and stable  Post vital signs: stable  Last Vitals:  Filed Vitals:   09/21/15 0947  BP: 158/63  Pulse: 74  Temp: 36.4 C  Resp: 16    Complications: No apparent anesthesia complications

## 2015-09-21 NOTE — Anesthesia Preprocedure Evaluation (Addendum)
Anesthesia Evaluation  Patient identified by MRN, date of birth, ID band Patient awake    Reviewed: Allergy & Precautions, H&P , NPO status , Patient's Chart, lab work & pertinent test results  Airway Mallampati: II  TM Distance: <3 FB Neck ROM: full    Dental no notable dental hx. (+) Dental Advisory Given, Teeth Intact   Pulmonary COPD,  COPD inhaler,    Pulmonary exam normal breath sounds clear to auscultation       Cardiovascular hypertension, Pt. on medications Normal cardiovascular exam+ dysrhythmias  Rhythm:regular Rate:Normal  Palpitations. LAFB. Echo with normal EF. Grade 1 diastolic dysfunction.  Recent low risk stress test   Neuro/Psych Anxiety Glaucoma. 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 negative neurological ROS  negative psych ROS   GI/Hepatic negative GI ROS, Neg liver ROS, GERD  Medicated and Controlled,  Endo/Other  Hypothyroidism   Renal/GU negative Renal ROS  negative genitourinary   Musculoskeletal   Abdominal   Peds  Hematology negative hematology ROS (+)   Anesthesia Other Findings   Reproductive/Obstetrics negative OB ROS                          Anesthesia Physical Anesthesia Plan  ASA: III  Anesthesia Plan: General   Post-op Pain Management:    Induction: Intravenous  Airway Management Planned: Oral ETT  Additional Equipment:   Intra-op Plan:   Post-operative Plan: Extubation in OR  Informed Consent: I have reviewed the patients History and Physical, chart, labs and discussed the procedure including the risks, benefits and alternatives for the proposed anesthesia with the patient or authorized representative who has indicated his/her understanding and acceptance.   Dental Advisory Given  Plan Discussed with: CRNA and Surgeon  Anesthesia Plan Comments:         Anesthesia Quick Evaluation

## 2015-09-21 NOTE — H&P (Signed)
CHIEF COMPLAINT:                                          Back pain and right leg pain with radiation through the buttock.    HISTORY OF PRESENT ILLNESS:                     [name] is an 82 year old, left-handed individual on whom I did surgery in 02/2011.  At that time, she had bilateral decompression at L3-4 and L4-5, and she notes that after the surgery she did well.  She tells me that since about September of this year she has had a progressively worsening pain that is preventing her from walking and getting around, particularly with pain radiating into that right leg.  She describes pain in the region of the right buttock, with a hot spot in the buttock, tingling down into the numbness in the calf and the anterior border of the thigh on that right leg.  She does not have any pain in the foot, though.  DIAGNOSTIC STUDIES:                                    She brings with her an MRI of the lumbar spine that was recently performed on 08/13/2015.  This study demonstrates that she has a spondylolisthesis at L3-4 and at L4-5.  But at L3-4, there is a large, herniated disc behind the body of L3 causing some compression of the lateral aspect of the thecal sac and the takeoff for the L3 nerve root on the right side.  I reviewed these findings with her today.    REVIEW OF SYSTEMS:                                    I note that she had some glaucoma, nasal congestion and drainage, high blood pressure, arthritis, food allergies, and thyroid disease are all noted on a 14-Point Review Sheet.    PAST MEDICAL HISTORY:                                Reveals that she has had some high blood pressures in the past, some gastroesophageal reflux disease, and lactose intolerance.  She also has hypothyroidism.    . Medications and Allergies:  Include irbesartan, pantoprazole, ipratropium bromide, Synthroid, Crestor, clonazepam, Tretinoin cream, and Prolia.  She also uses some Benefiber and Centrum Silver,  Citracal, Zyrtec, and Tylenol over-the-counter.  For pain, she has been using some tramadol 50 mg one or two tablets three times a day.   PHYSICAL EXAMINATION:                                On examination, I note that she has weakness in the iliopsoas on the right side.  Her quadriceps strength seems good, although the reflexes are 2+ on the left side and trace on the right side in the quadriceps.  Achilles reflexes are trace bilaterally.  Her dorsi- and plantar flexor strength appear to be intact.  Palpation of her back reveals that there is a  tenderness region over the posterior suprailiac crest on the right side, near the junction of the sacroiliac joint.  Her mobility otherwise is quite good.    On her physical exam we noted that there is some modest weakness in the iliopsoas and the quadriceps on the right compared to left with absent reflex in the patellae.    IMPRESSION AND PLAN:                                 The patient has evidence of an extruded fragment of disc at L3-4 on the right side.  She has had a previous decompression via bilateral laminotomies at L3-4 and L4-5, and now this lateral recess compression likely causes some radicular pain in that right leg.  I noted that the simplest of procedures would be to do a discectomy and remove the herniated fragment of disc, decompress the common dural tube, and then take off the L3 and L4 nerve roots.  This should hopefully help remediate the worst of her symptoms.  I did note that if the pain persists in being a problem, particularly in that iliac region posteriorly, she may benefit from a sacroiliac joint injection, but I would be hopeful that we get good relief of all of her symptoms with a simple discectomy.  We will plan on scheduling this at the earliest possible convenience for her.

## 2015-09-22 ENCOUNTER — Encounter (HOSPITAL_COMMUNITY): Payer: Self-pay | Admitting: Neurological Surgery

## 2015-09-22 MED ORDER — TRAMADOL HCL 50 MG PO TABS: 50.0000 mg | ORAL_TABLET | Freq: Four times a day (QID) | ORAL | Status: DC | PRN

## 2015-09-22 MED ORDER — DIAZEPAM 5 MG PO TABS
5.0000 mg | ORAL_TABLET | Freq: Four times a day (QID) | ORAL | Status: DC | PRN
Start: 2015-09-22 — End: 2016-01-01

## 2015-09-22 NOTE — Progress Notes (Signed)
Occupational Therapy Evaluation Patient Details Name: Destiny [name] MRN: [id number] DOB: [birth year] Today's Date: 09/22/2015    History of Present Illness Right Lumbar Three-FourMicrodiskectomy    Clinical Impression   PTA, pt independent with ADL and mobility and lived in [place] apt at [facility]. Completted all education regarding back precautions for ADL and mobility. Pt able to return demonstrate. Pt ready to D/C home with intermittent S when medically stable.     Follow Up Recommendations  No OT follow up;Supervision - Intermittent    Equipment Recommendations  None recommended by OT    Recommendations for Other Services       Precautions / Restrictions Precautions Precautions: Back      Mobility Bed Mobility Overal bed mobility: Needs Assistance Bed Mobility: Sidelying to Sit;Sit to Sidelying   Sidelying to sit: Supervision     Sit to sidelying: Supervision General bed mobility comments: vc for technique  Transfers Overall transfer level: Needs assistance   Transfers: Sit to/from Stand Sit to Stand: Modified independent (Device/Increase time)              Balance Overall balance assessment: No apparent balance deficits (not formally assessed)                                          ADL Overall ADL's : Needs assistance/impaired                                     Functional mobility during ADLs: Supervision/safety General ADL Comments: completed education on use of AE for LB ADL in order to follow back precuations. Pt states she is unable to cross her feet over knees or bring her feet up to avoid bending due to R hip replacemtn. Educated pt on use of AE to compelte LB ADL. Pt able to return demonstrate. Daughter present during session. Recommended pt use shower chair for bathing to reduce risk of falls and increase adherance to back precuations.      Vision     Perception     Praxis       Pertinent Vitals/Pain Pain Assessment: No/denies pain     Hand Dominance     Extremity/Trunk Assessment Upper Extremity Assessment Upper Extremity Assessment: Overall WFL for tasks assessed   Lower Extremity Assessment Lower Extremity Assessment: Defer to PT evaluation;RLE deficits/detail (s/p hip replacement limiting mobility per pt)   Cervical / Trunk Assessment Cervical / Trunk Assessment: Normal   Communication Communication Communication: No difficulties   Cognition Arousal/Alertness: Awake/alert Behavior During Therapy: WFL for tasks assessed/performed Overall Cognitive Status: Within Functional Limits for tasks assessed                     General Comments       Exercises       Shoulder Instructions      Home Living Family/patient expects to be discharged to:: Assisted living                             Home Equipment: Shower seat   Additional Comments: independent Living Apt      Prior Functioning/Environment Level of Independence: Independent             OT Diagnosis: Generalized weakness;Acute pain  OT Problem List: Decreased activity tolerance;Decreased knowledge of use of DME or AE;Decreased knowledge of precautions;Pain   OT Treatment/Interventions:      OT Goals(Current goals can be found in the care plan section) Acute Rehab OT Goals Patient Stated Goal: to go home OT Goal Formulation: All assessment and education complete, DC therapy  OT Frequency:     Barriers to D/C:            Co-evaluation              End of Session Equipment Utilized During Treatment: Other (comment) (AE) Nurse Communication: Mobility status  Activity Tolerance: Patient tolerated treatment well Patient left: in bed;with call bell/phone within reach;with family/visitor present   Time: 1005-1045 OT Time Calculation (min): 40 min Charges:  OT General Charges $OT Visit: 1 Procedure OT Evaluation $OT Eval Moderate Complexity:  1 Procedure OT Treatments $Self Care/Home Management : 23-37 mins G-Codes:    Pretty Weltman,HILLARY Oct 14, 2015, 11:27 AM   Maurie Boettcher, OTR/L  [phone] 10/14/2015

## 2015-09-22 NOTE — Evaluation (Signed)
Physical Therapy Evaluation and Discharge Patient Details Name: Destiny [name] MRN: [id number] DOB: August 23, 1928 Today's Date: 09/22/2015   History of Present Illness  Right Lumbar Three-FourMicrodiskectomy   Clinical Impression  Patient evaluated by Physical Therapy with no further acute PT needs identified. All education has been completed and the patient has no further questions. At the time of PT eval pt was able to perform transfers and ambulation with supervision to modified independence. Pt will have staff available for assist if needed at d/c per pt and daughter. See below for any follow-up Physical Therapy or equipment needs. PT is signing off. Thank you for this referral.     Follow Up Recommendations Outpatient PT;Supervision for mobility/OOB    Equipment Recommendations  None recommended by PT    Recommendations for Other Services       Precautions / Restrictions Precautions Precautions: Back Precaution Booklet Issued: Yes (comment) Precaution Comments: Reviewed precautions during functional mobility Restrictions Weight Bearing Restrictions: No      Mobility  Bed Mobility Overal bed mobility: Needs Assistance Bed Mobility: Sidelying to Sit;Sit to Sidelying;Rolling   Sidelying to sit: Supervision     Sit to sidelying: Supervision General bed mobility comments: vc for technique  Transfers Overall transfer level: Modified independent Equipment used: None Transfers: Sit to/from Stand Sit to Stand: Modified independent (Device/Increase time)         General transfer comment: No assist required for pt to power-up to full standing position.  Ambulation/Gait Ambulation/Gait assistance: Supervision Ambulation Distance (Feet): 200 Feet Assistive device: None (Occasional rail use in hall) Gait Pattern/deviations: Step-through pattern;Decreased stride length Gait velocity: Decreased Gait velocity interpretation: Below normal speed for age/gender General Gait  Details: Generally slow but steady. Pt reaching out for railings in hall for support but did not require heavy reliance on rails for balance.   Stairs            Wheelchair Mobility    Modified Rankin (Stroke Patients Only)       Balance Overall balance assessment: No apparent balance deficits (not formally assessed)                                           Pertinent Vitals/Pain Pain Assessment: Faces Faces Pain Scale: Hurts a little bit Pain Location: Incisional pain Pain Descriptors / Indicators: Operative site guarding;Aching    Home Living Family/patient expects to be discharged to:: Assisted living               Home Equipment: Shower seat Additional Comments: independent Living Apt. Has rollator and cane    Prior Function Level of Independence: Independent               Hand Dominance        Extremity/Trunk Assessment   Upper Extremity Assessment: Defer to OT evaluation           Lower Extremity Assessment: Generalized weakness (s/p hip replacement limiting mobility per pt)      Cervical / Trunk Assessment: Normal  Communication   Communication: No difficulties  Cognition Arousal/Alertness: Awake/alert Behavior During Therapy: WFL for tasks assessed/performed Overall Cognitive Status: Within Functional Limits for tasks assessed                      General Comments      Exercises        Assessment/Plan  PT Assessment Patent does not need any further PT services;All further PT needs can be met in the next venue of care  PT Diagnosis Difficulty walking;Acute pain   PT Problem List    PT Treatment Interventions     PT Goals (Current goals can be found in the Care Plan section) Acute Rehab PT Goals Patient Stated Goal: to go home PT Goal Formulation: All assessment and education complete, DC therapy    Frequency     Barriers to discharge        Co-evaluation               End of  Session   Activity Tolerance: Patient tolerated treatment well Patient left: in bed;with call bell/phone within reach;with family/visitor present Nurse Communication: Mobility status         Time: 4171-2787 PT Time Calculation (min) (ACUTE ONLY): 10 min   Charges:   PT Evaluation $PT Eval Moderate Complexity: 1 Procedure     PT G Codes:        Rolinda Roan Oct 13, 2015, 11:51 AM  Rolinda Roan, PT, DPT Acute Rehabilitation Services Pager: [phone]

## 2015-09-22 NOTE — Progress Notes (Signed)
Patient alert and oriented, mae's well, voiding adequate amount of urine, swallowing without difficulty, no c/o pain. Patient discharged home with family. Script and discharged instructions given to patient. Patient and family stated understanding of d/c instructions given and has an appointment with MD. 

## 2015-09-22 NOTE — Discharge Summary (Signed)
Physician Discharge Summary  Patient ID: Destiny [name] MRN: [id number] DOB/AGE: Jun 04, 1928 82 y.o.  Admit date: 09/21/2015 Discharge date: 09/22/2015  Admission Diagnoses: Herniated nucleus pulposus L3-4 right with right lumbar radiculopathy  Discharge Diagnoses: Herniated nucleus pulposus L3-4 right with right lumbar radiculopathy Active Problems:   Herniated nucleus pulposus, L3-4 right   Discharged Condition: good  Hospital Course: Patient was admitted to undergo surgical decompression of a large extruded fragment of disc at L3-L4 and right side. She tolerated surgery well.  Consults: None  Significant Diagnostic Studies: None  Treatments: Microdiscectomy L3-4 right with operating microscope Microsect dissection technique  Discharge Exam: Blood pressure 133/79, pulse 87, temperature 98.7 F (37.1 C), temperature source Oral, resp. rate 16, SpO2 97 %. Vision is clean and dry. Patient's station and gait are intact.  Disposition: 01-Home or Self Care  Discharge Instructions    Call MD for:  redness, tenderness, or signs of infection (pain, swelling, redness, odor or green/yellow discharge around incision site)    Complete by:  As directed      Call MD for:  severe uncontrolled pain    Complete by:  As directed      Call MD for:  temperature >100.4    Complete by:  As directed      Diet - low sodium heart healthy    Complete by:  As directed      Discharge instructions    Complete by:  As directed   Okay to shower. Do not apply salves or appointments to incision. No heavy lifting with the upper extremities greater than 15 pounds. May resume driving when not requiring pain medication and patient feels comfortable with doing so.     Increase activity slowly    Complete by:  As directed             Medication List    TAKE these medications        acetaminophen 500 MG tablet  Commonly known as:  TYLENOL  Take 1,000 mg by mouth every 8 (eight) hours as needed for  mild pain or headache.     BENEFIBER DRINK MIX PO  Take by mouth. 2 tablespoons once daily.     cetirizine 10 MG tablet  Commonly known as:  ZYRTEC  Take 10 mg by mouth. Take one tablet daily as needed     CITRACAL +D3 250-107-500 MG-MG-UNIT Chew  Generic drug:  Calcium-Phosphorus-Vitamin D  Chew 1 tablet by mouth daily.     clonazePAM 0.5 MG tablet  Commonly known as:  KLONOPIN  Take 0.25 mg by mouth 2 (two) times daily as needed for anxiety.     diazepam 5 MG tablet  Commonly known as:  VALIUM  Take 1 tablet (5 mg total) by mouth every 6 (six) hours as needed for muscle spasms.     diltiazem 240 MG 24 hr capsule  Commonly known as:  CARDIZEM CD  Take 240 mg by mouth daily.     dorzolamide-timolol 22.3-6.8 MG/ML ophthalmic solution  Commonly known as:  COSOPT  Place 1 drop into both eyes 2 (two) times daily.     irbesartan-hydrochlorothiazide 300-12.5 MG tablet  Commonly known as:  AVALIDE  Take 1 tablet by mouth daily.     multivitamin with minerals tablet  Take 1 tablet by mouth daily.     pantoprazole 40 MG tablet  Commonly known as:  PROTONIX  Take 40 mg by mouth daily.     PROLIA 60 MG/ML Soln injection  Generic drug:  denosumab  Inject 60 mg into the skin every 6 (six) months. Administer in upper arm, thigh, or abdomen     rosuvastatin 10 MG tablet  Commonly known as:  CRESTOR  Take 5 mg by mouth at bedtime.     SYNTHROID 125 MCG tablet  Generic drug:  levothyroxine  Take one tablet by mouth daily.     timolol 0.5 % ophthalmic solution  Commonly known as:  BETIMOL  Place 1 drop into both eyes 2 (two) times daily.     traMADol 50 MG tablet  Commonly known as:  ULTRAM  One tablet every 6 hours by mouth as needed for pain     traMADol 50 MG tablet  Commonly known as:  ULTRAM  Take 1 tablet (50 mg total) by mouth every 6 (six) hours as needed for moderate pain.     tretinoin 0.025 % cream  Commonly known as:  RETIN-A  Apply 1 application topically  at bedtime. Apply to face           Follow-up Information    Follow up with Earleen Newport, MD.   Specialty:  Neurosurgery   Contact information:   [address] [place] [address] [phone]       Signed: Earleen Newport 09/22/2015, 5:06 PM

## 2015-10-08 DIAGNOSIS — I10 Essential (primary) hypertension: Secondary | ICD-10-CM | POA: Diagnosis not present

## 2015-10-08 DIAGNOSIS — R9431 Abnormal electrocardiogram [ECG] [EKG]: Secondary | ICD-10-CM | POA: Diagnosis not present

## 2015-10-08 DIAGNOSIS — E7800 Pure hypercholesterolemia, unspecified: Secondary | ICD-10-CM | POA: Diagnosis not present

## 2015-10-20 DIAGNOSIS — M5126 Other intervertebral disc displacement, lumbar region: Secondary | ICD-10-CM | POA: Diagnosis not present

## 2015-10-20 DIAGNOSIS — R278 Other lack of coordination: Secondary | ICD-10-CM | POA: Diagnosis not present

## 2015-10-20 DIAGNOSIS — R2689 Other abnormalities of gait and mobility: Secondary | ICD-10-CM | POA: Diagnosis not present

## 2015-10-20 DIAGNOSIS — R2681 Unsteadiness on feet: Secondary | ICD-10-CM | POA: Diagnosis not present

## 2015-10-21 DIAGNOSIS — R2681 Unsteadiness on feet: Secondary | ICD-10-CM | POA: Diagnosis not present

## 2015-10-21 DIAGNOSIS — M5126 Other intervertebral disc displacement, lumbar region: Secondary | ICD-10-CM | POA: Diagnosis not present

## 2015-10-21 DIAGNOSIS — R278 Other lack of coordination: Secondary | ICD-10-CM | POA: Diagnosis not present

## 2015-10-21 DIAGNOSIS — R2689 Other abnormalities of gait and mobility: Secondary | ICD-10-CM | POA: Diagnosis not present

## 2015-10-26 DIAGNOSIS — R2681 Unsteadiness on feet: Secondary | ICD-10-CM | POA: Diagnosis not present

## 2015-10-26 DIAGNOSIS — R278 Other lack of coordination: Secondary | ICD-10-CM | POA: Diagnosis not present

## 2015-10-26 DIAGNOSIS — M5126 Other intervertebral disc displacement, lumbar region: Secondary | ICD-10-CM | POA: Diagnosis not present

## 2015-10-26 DIAGNOSIS — R2689 Other abnormalities of gait and mobility: Secondary | ICD-10-CM | POA: Diagnosis not present

## 2015-10-28 DIAGNOSIS — M5126 Other intervertebral disc displacement, lumbar region: Secondary | ICD-10-CM | POA: Diagnosis not present

## 2015-10-28 DIAGNOSIS — R2681 Unsteadiness on feet: Secondary | ICD-10-CM | POA: Diagnosis not present

## 2015-10-28 DIAGNOSIS — R2689 Other abnormalities of gait and mobility: Secondary | ICD-10-CM | POA: Diagnosis not present

## 2015-10-28 DIAGNOSIS — R278 Other lack of coordination: Secondary | ICD-10-CM | POA: Diagnosis not present

## 2015-10-30 DIAGNOSIS — H401132 Primary open-angle glaucoma, bilateral, moderate stage: Secondary | ICD-10-CM | POA: Diagnosis not present

## 2015-10-30 DIAGNOSIS — Z961 Presence of intraocular lens: Secondary | ICD-10-CM | POA: Diagnosis not present

## 2015-11-04 DIAGNOSIS — R2681 Unsteadiness on feet: Secondary | ICD-10-CM | POA: Diagnosis not present

## 2015-11-04 DIAGNOSIS — M5126 Other intervertebral disc displacement, lumbar region: Secondary | ICD-10-CM | POA: Diagnosis not present

## 2015-11-04 DIAGNOSIS — R2689 Other abnormalities of gait and mobility: Secondary | ICD-10-CM | POA: Diagnosis not present

## 2015-11-04 DIAGNOSIS — R278 Other lack of coordination: Secondary | ICD-10-CM | POA: Diagnosis not present

## 2015-12-15 DIAGNOSIS — D2230 Melanocytic nevi of unspecified part of face: Secondary | ICD-10-CM | POA: Diagnosis not present

## 2015-12-15 DIAGNOSIS — L720 Epidermal cyst: Secondary | ICD-10-CM | POA: Diagnosis not present

## 2015-12-15 DIAGNOSIS — D225 Melanocytic nevi of trunk: Secondary | ICD-10-CM | POA: Diagnosis not present

## 2015-12-15 DIAGNOSIS — Z85828 Personal history of other malignant neoplasm of skin: Secondary | ICD-10-CM | POA: Diagnosis not present

## 2015-12-15 DIAGNOSIS — C44519 Basal cell carcinoma of skin of other part of trunk: Secondary | ICD-10-CM | POA: Diagnosis not present

## 2015-12-15 DIAGNOSIS — L821 Other seborrheic keratosis: Secondary | ICD-10-CM | POA: Diagnosis not present

## 2015-12-15 DIAGNOSIS — D485 Neoplasm of uncertain behavior of skin: Secondary | ICD-10-CM | POA: Diagnosis not present

## 2016-01-01 ENCOUNTER — Encounter: Payer: Self-pay | Admitting: Internal Medicine

## 2016-01-06 DIAGNOSIS — C44519 Basal cell carcinoma of skin of other part of trunk: Secondary | ICD-10-CM | POA: Diagnosis not present

## 2016-02-03 ENCOUNTER — Non-Acute Institutional Stay: Payer: Medicare Other | Admitting: Internal Medicine

## 2016-02-03 ENCOUNTER — Encounter: Payer: Self-pay | Admitting: Internal Medicine

## 2016-02-03 VITALS — BP 138/60 | HR 66 | Temp 98.6°F | Ht 63.0 in | Wt 142.0 lb

## 2016-02-03 DIAGNOSIS — E038 Other specified hypothyroidism: Secondary | ICD-10-CM

## 2016-02-03 DIAGNOSIS — M5126 Other intervertebral disc displacement, lumbar region: Secondary | ICD-10-CM | POA: Diagnosis not present

## 2016-02-03 DIAGNOSIS — M5136 Other intervertebral disc degeneration, lumbar region: Secondary | ICD-10-CM

## 2016-02-03 DIAGNOSIS — C44519 Basal cell carcinoma of skin of other part of trunk: Secondary | ICD-10-CM | POA: Insufficient documentation

## 2016-02-03 DIAGNOSIS — H409 Unspecified glaucoma: Secondary | ICD-10-CM | POA: Diagnosis not present

## 2016-02-03 DIAGNOSIS — E785 Hyperlipidemia, unspecified: Secondary | ICD-10-CM | POA: Diagnosis not present

## 2016-02-03 DIAGNOSIS — K219 Gastro-esophageal reflux disease without esophagitis: Secondary | ICD-10-CM | POA: Diagnosis not present

## 2016-02-03 DIAGNOSIS — R197 Diarrhea, unspecified: Secondary | ICD-10-CM | POA: Diagnosis not present

## 2016-02-03 DIAGNOSIS — E739 Lactose intolerance, unspecified: Secondary | ICD-10-CM

## 2016-02-03 DIAGNOSIS — Z9889 Other specified postprocedural states: Secondary | ICD-10-CM

## 2016-02-03 DIAGNOSIS — K449 Diaphragmatic hernia without obstruction or gangrene: Secondary | ICD-10-CM | POA: Diagnosis not present

## 2016-02-03 DIAGNOSIS — M810 Age-related osteoporosis without current pathological fracture: Secondary | ICD-10-CM | POA: Insufficient documentation

## 2016-02-03 DIAGNOSIS — E034 Atrophy of thyroid (acquired): Secondary | ICD-10-CM

## 2016-02-03 DIAGNOSIS — J309 Allergic rhinitis, unspecified: Secondary | ICD-10-CM

## 2016-02-03 NOTE — Patient Instructions (Addendum)
Ok to stop protonix for now and see how it goes.    May try loratadine instead of cetirizine for your sinuses.

## 2016-02-03 NOTE — Progress Notes (Signed)
Provider:  Rexene Edison. Mariea Clonts, D.O., C.M.D. Location:  [facility] of Service:  Clinic (12)  PCP: Hollace Kinnier, DO Patient Care Team: Gayland Curry, DO as PCP - General (Geriatric Medicine) Deland Pretty, MD (Internal Medicine) Ronald Lobo, MD as Consulting Physician (Gastroenterology) Jari Pigg, MD as Consulting Physician (Dermatology) Shon Hough, MD as Consulting Physician (Ophthalmology) Kristeen Miss, MD as Consulting Physician (Neurosurgery) Justice Britain, MD as Consulting Physician (Orthopedic Surgery) Gaynelle Arabian, MD as Consulting Physician (Orthopedic Surgery) Suella Broad, MD as Consulting Physician (Physical Medicine and Rehabilitation)  Extended Emergency Contact Information Primary Emergency Contact: [place] Phone: [phone] Relation: Daughter Secondary Emergency Contact: [place] Phone: [phone] Relation: Daughter  Code Status: DNR Goals of Care: Advanced Directive information Advanced Directives 02/03/2016  Does patient have an advance directive? Yes  Type of Advance Directive [place]  Does patient want to make changes to advanced directive? -  Copy of advanced directive(s) in chart? Yes   Chief Complaint  Patient presents with  . Establish Care    new patient    HPI: Patient is a 82 y.o. Destiny [name] seen today at [facility] to establish as a patient.  She had been seeing Dr. Shelia [name] before.  I followed her while she was in rehab at [facility].  Since then, her sciatica did not get better.  It turned out this was not piriformis syndrome but a herniated disc and she underwent surgery for it  (right L3-4 microdiskectomy by Dr. Ellene Route).  Still has spondylolisthesis.  Had a prior back surgery also.  Still has a sensation of a vice on her right lower leg.  Stiff early in am and at night.  She's hoping feeling will come back.     Hypertension:  Went to [facility]--had echo and stress test and Dr. Einar Gip said her bp was causing it (had changes on her EKG).  Is on cardizem, irbesartan/hctz.    Hypercholesterolemia:  Controlled with 1/2 of a tablet of crestor.  Working well for several years.  HDL is good.  Hypothyroidism:  On synthroid 177mcg for 2-3 years.  Never had surgery.  Was getting very fatigued.    Senile osteoporosis:  On prolia--last injection was Jul 26, 2015.  Thinks she's been on it for 3 years.  Thinks she is due this fall for her bone density--last was in 2015 at [facility].    Osteoarthritis:  Shoulders, elbows, wrists, knees, back.  Uses tylenol.  Sometimes doesn't.  Weather changes bother her a lot.  Best in heat.  GERD:  Taking protonix PPI.  Bought herself ranitidine b/c she is worried about long term effects of ppi.  Only bothesome if eats spicy food.  Does have small hiatal hernia noted by Dr. Cristina Gong.    Glaucoma:  Doing ok with this.  Wears glasses.  Pressures are controlled.  Sees Dr. Kathrin Penner.    Lactose intolerance:  Went back to Dr. Cristina Gong last year.  Was having bloating and gas after meals.  He treated her for pancreatic insufficiency.  Medication did nothing but empty her pocketbook (creon).  Thought about her foods--oatmeal with milk, yogurt or things with cheese.  Now having this problem only every week or 10 days.  Is avoiding those things and go lactose tablets and lactose free milk.  Has no nausea with the diarrhea.  Tries to avoid imodium b/c then gets constipated.  Had constipation at one time.  No blood in stool or dark stool.  Lost weight last fall, but gained it back due to eating starchy foods.  Takes benefiber already.  Discussed trying a different one to bulk stools.    Has difficulty with sinus and allergy.  Takes generic zyrtec and it's not doing much good.  nasonex was very helpful but it bothered her glaucoma.    Has a big bottle of klonopin.  Uses rarely for  nerves.    Had basal cell ca off her back and waiting for that to heal. Then got wound on left shin with car door.    Past Medical History  Diagnosis Date  . Allergy   . Arthritis   . Emphysema of lung ([place])   . Cataract   . GERD (gastroesophageal reflux disease)   . Thyroid disease   . Osteoporosis   . Hypertension   . High cholesterol   . Hypothyroidism   . Insomnia   . Chronic anxiety   . Spinal stenosis   . Varicose veins   . Constipation   . Glaucoma   . Lumbar degenerative disc disease   . Diverticulosis of sigmoid colon   . Palpitations   . LBBB (left bundle branch block)   . H/O adenomatous polyp of colon     Dr. Cristina Gong  . Onychomycosis   . 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 Osteoarthritis   . Lactose intolerance    Past Surgical History  Procedure Laterality Date  . Cholecystectomy    . Joint replacement    . Abdominal hysterectomy    . Shoulder arthroscopy with rotator cuff repair Right 08/2003  . Bladder repair    . Laminectomy  02/2011    L3-L5  Dr. Ellene Route  . Incontinence surgery    . Total hip arthroplasty      right 1999  . Colonoscopy  05/18/2012  . Cataract extraction w/ intraocular lens  implant, bilateral  7 & 8 2014  . Blephoplasty bilateral  03/10/2014  . Eye surgery    . Lumbar laminectomy/decompression microdiscectomy Right 09/21/2015    Procedure: Right Lumbar Three-FourMicrodiskectomy;  Surgeon: Kristeen Miss, MD;  Location: [facility];  Service: Neurosurgery;  Laterality: Right;  Right L3-4 Microdiskectomy  . Tonsillectomy    . Herniated disc repair      reports that she has never smoked. She has never used smokeless tobacco. She reports that she does not drink alcohol or use illicit drugs. Social History   Social History  . Marital Status: Widowed    Spouse Name: N/A  . Number of Children: N/A  . Years of Education: N/A   Occupational History  . Not on file.   Social  History Main Topics  . Smoking status: Never Smoker   . Smokeless tobacco: Never Used  . Alcohol Use: No  . Drug Use: No  . Sexual Activity: No   Other Topics Concern  . Not on file   Social History Narrative   Lives at [facility]   Caffeine 1-2 cups daily   Exercise water aerobics, walking   Never smoked   POA         Diet:       Do you drink/ eat things with caffeine? Yes      Marital status:    Widow  What year were you married ? 1957      Do you live in a house, apartment,assistred living, condo, trailer, etc.)?[place] [facility]      Is it one or more stories?       How many persons live in your home ? 1      Do you have any pets in your home ?(please list)0       Current or past profession: Social Worker      Do you exercise?  Yes                            Type & how often:  Water Aerobic, 3-4 hours per week       Do you have a living will? Yes      Do you have a DNR form? Yes                       If not, do you want to discuss one?       Do you have signed POA?HPOA forms?                 If so, please bring to your        appointment             Functional Status Survey: Is the patient deaf or have difficulty hearing?: No Does the patient have difficulty seeing, even when wearing glasses/contacts?: No Does the patient have difficulty concentrating, remembering, or making decisions?: No Does the patient have difficulty walking or climbing stairs?: No Does the patient have difficulty dressing or bathing?: No Does the patient have difficulty doing errands alone such as visiting a doctor's office or shopping?: No  Family History  Problem Relation Age of Onset  . Heart attack Father   . Arthritis Mother   . Osteoporosis Mother     Health Maintenance  Topic Date Due  . Samul Dada  08/20/1947  . DEXA SCAN  08/21/1995  . PNA vac Low Risk Adult (2 of 2 - PCV13) 08/29/2014  . INFLUENZA VACCINE   03/29/2016  . ZOSTAVAX  Completed    Allergies  Allergen Reactions  . Codeine Nausea Only  . Macrobid [Nitrofurantoin] Diarrhea  . Miralax [Polyethylene Glycol] Other (See Comments)    cramping  . Pain Relief [Acetaminophen] Nausea And Vomiting  . Paxil [Paroxetine Hcl] Nausea Only  . Percodan [Oxycodone-Aspirin] Itching  . Oxycodone Rash  . Oxycodone-Acetaminophen Rash  . Penicillins Rash      Medication List       This list is accurate as of: 02/03/16  3:42 PM.  Always use your most recent med list.               acetaminophen 500 MG tablet  Commonly known as:  TYLENOL  Take 1,000 mg by mouth at bedtime.     BENEFIBER DRINK MIX PO  Take by mouth. 2 tablespoons once daily.     cetirizine 10 MG tablet  Commonly known as:  ZYRTEC  Take 10 mg by mouth. Take one tablet daily as needed     CITRACAL +D3 250-107-500 MG-MG-UNIT Chew  Generic drug:  Calcium-Phosphorus-Vitamin D  Chew 1 tablet by mouth daily.     clonazePAM 0.5 MG tablet  Commonly known as:  KLONOPIN  Take 0.25 mg by mouth 2 (two) times daily as needed for anxiety.     diltiazem 240 MG 24  hr capsule  Commonly known as:  CARDIZEM CD  Take 240 mg by mouth daily.     dorzolamide-timolol 22.3-6.8 MG/ML ophthalmic solution  Commonly known as:  COSOPT  Place 1 drop into both eyes 2 (two) times daily.     ipratropium 0.06 % nasal spray  Commonly known as:  ATROVENT  Place 2 sprays into both nostrils 3 (three) times daily.     irbesartan-hydrochlorothiazide 300-12.5 MG tablet  Commonly known as:  AVALIDE  Take 1 tablet by mouth daily.     multivitamin with minerals tablet  Take 1 tablet by mouth daily.     pantoprazole 40 MG tablet  Commonly known as:  PROTONIX  Take 40 mg by mouth daily.     PROLIA 60 MG/ML Soln injection  Generic drug:  denosumab  Inject 60 mg into the skin every 6 (six) months. Administer in upper arm, thigh, or abdomen     rosuvastatin 10 MG tablet  Commonly known as:   CRESTOR  Take 5 mg by mouth at bedtime.     SYNTHROID 125 MCG tablet  Generic drug:  levothyroxine  Take one tablet by mouth daily.        Review of Systems  Constitutional: Negative for fever, chills and malaise/fatigue.  HENT: Positive for congestion. Negative for hearing loss and sore throat.   Eyes: Negative for blurred vision.       Glaucoma  Respiratory: Negative for cough and shortness of breath.   Cardiovascular: Negative for chest pain, palpitations and leg swelling.  Gastrointestinal: Positive for heartburn, diarrhea and constipation. Negative for abdominal pain, blood in stool and melena.  Genitourinary: Negative for dysuria, urgency and frequency.  Musculoskeletal: Negative for myalgias and falls.  Neurological: Negative for dizziness, loss of consciousness and weakness.  Endo/Heme/Allergies: Does not bruise/bleed easily.  Psychiatric/Behavioral: Negative for depression and memory loss. The patient is nervous/anxious. The patient does not have insomnia.     Filed Vitals:   02/03/16 1437  BP: 138/60  Pulse: 66  Temp: 98.6 F (37 C)  TempSrc: Oral  Height: 5\' 3"  (1.6 m)  Weight: 142 lb (64.411 kg)  SpO2: 97%   Body mass index is 25.16 kg/(m^2). Physical Exam  Constitutional: She is oriented to person, place, and time. She appears well-developed and well-nourished. No distress.  HENT:  Head: Normocephalic and atraumatic.  Cardiovascular: Normal rate, regular rhythm, normal heart sounds and intact distal pulses.   Pulmonary/Chest: Effort normal and breath sounds normal. No respiratory distress.  Abdominal: Soft. Bowel sounds are normal.  Musculoskeletal: Normal range of motion.  Neurological: She is alert and oriented to person, place, and time.  Skin: Skin is warm and dry.  Psychiatric: She has a normal mood and affect.    Labs reviewed: Basic Metabolic Panel:  Recent Labs  07/11/15 0912 07/16/15 09/14/15 1427  NA 139 139 141  K 3.9 3.7 3.3*  CL 107   --  107  CO2 24  --  26  GLUCOSE 123*  --  149*  BUN 13 14 17   CREATININE 0.48 0.6 0.61  CALCIUM 9.5  --  9.4   Liver Function Tests:  Recent Labs  07/11/15 0912 07/16/15  AST 29 14  ALT 21 12  ALKPHOS 59 54  BILITOT 1.4*  --   PROT 6.7  --   ALBUMIN 4.0  --     Recent Labs  07/11/15 0912  LIPASE 25   No results for input(s): AMMONIA in the last 8760  hours. CBC:  Recent Labs  07/11/15 0912 09/14/15 1427  WBC 5.7 10.3  HGB 13.8 13.2  HCT 40.9 40.2  MCV 92.3 96.6  PLT 240 262    Imaging and Procedures obtained: 2013 cscope Dr. Cristina Gong 2015 bone density Dr. Shelia [name] 2015 [facility] 2017 MRI lumbar Dr. Ellene Route 2017 stress test Dr. Einar Gip  Assessment/Plan 1. Hypothyroidism due to acquired atrophy of thyroid - cont current synthroid, last tsh in Jan wnl -has been on same dose for about 2 years - TSH; Future  2. DDD (degenerative disc disease), lumbar -has some chronic spinal stenosis and spondylolisthesis   3. Lumbar herniated disc 4. S/P lumbar discectomy  by Dr. Ellene Route -recovering slowly with some residual paresthesias in her right lower leg like a vice wrapped around it  5. HLD (hyperlipidemia) -lipids have been well controlled with 1/2 of a crestor for a long time also  6. Senile osteoporosis -cont prolia--needs f/u bone density in the fall -thinks she's been on it for 3 years -need records of prior bone density and all of her history from Dr. Kandy Garrison  7. Frequent loose stools - newly having a loose bm every 10 days or so despite avoiding milk products so unclear if she's eating things w/ some milk in it she does not know about or other foods are leading to this now -has not had red flags  8. Allergic sinusitis -change to allegra from zyrtec and monitor  9. Gastroesophageal reflux disease without esophagitis -stop protonix and use zantac prn and monitor -may always restart the protonix if reflux worsens -no symptoms except if she eats very  spicy foods  10. Hiatal hernia -noted on prior EGD by Dr. Cristina Gong  11. Glaucoma -controlled with current drops -follows with Dr. Kathrin Penner  12. Lactose intolerance in adult -more recently developed and is avoiding milk products and using lactaid  13. Basal cell carcinoma of lower back -s/p excision by derm -monitor for new lesions  Labs/tests ordered:  TSH in near future--going to office for it Next appt:  02/03/2016 med [place]. Amare Bail, D.O. [facility] [address], [place] [address] Cell Phone (Mon-Fri 8am-5pm):  [phone] On Call:  [phone] & follow prompts after 5pm & weekends Office Phone:  [phone] Office Fax:  [phone]

## 2016-02-15 ENCOUNTER — Telehealth: Payer: Self-pay | Admitting: *Deleted

## 2016-02-15 DIAGNOSIS — S6991XA Unspecified injury of right wrist, hand and finger(s), initial encounter: Secondary | ICD-10-CM

## 2016-02-15 NOTE — Telephone Encounter (Signed)
Per Mliss Sax, pt picked up a grocery bag and now her thumb is swollen and bernadette is asking for order for xray to make sure it's not broken. Please advise

## 2016-02-15 NOTE — Telephone Encounter (Signed)
Which thumb?  Obtain xrays of that hand.

## 2016-02-15 NOTE — Telephone Encounter (Signed)
Entered xray of right hand and wrist, spoke with bernadette and advised results

## 2016-02-16 ENCOUNTER — Ambulatory Visit
Admission: RE | Admit: 2016-02-16 | Discharge: 2016-02-16 | Disposition: A | Discharge status: Home / Self Care | Payer: Medicare Other | Source: Ambulatory Visit | Attending: Internal Medicine | Admitting: Internal Medicine

## 2016-02-16 DIAGNOSIS — S6991XA Unspecified injury of right wrist, hand and finger(s), initial encounter: Secondary | ICD-10-CM | POA: Diagnosis not present

## 2016-02-16 DIAGNOSIS — M25531 Pain in right wrist: Secondary | ICD-10-CM | POA: Diagnosis not present

## 2016-02-16 DIAGNOSIS — M19041 Primary osteoarthritis, right hand: Secondary | ICD-10-CM | POA: Diagnosis not present

## 2016-02-17 ENCOUNTER — Non-Acute Institutional Stay: Payer: Medicare Other | Admitting: Internal Medicine

## 2016-02-17 ENCOUNTER — Encounter: Payer: Self-pay | Admitting: Internal Medicine

## 2016-02-17 VITALS — BP 138/70 | HR 79 | Temp 98.7°F | Wt 142.0 lb

## 2016-02-17 DIAGNOSIS — M79644 Pain in right finger(s): Secondary | ICD-10-CM | POA: Diagnosis not present

## 2016-02-17 DIAGNOSIS — M1310 Monoarthritis, not elsewhere classified, unspecified site: Secondary | ICD-10-CM

## 2016-02-17 NOTE — Progress Notes (Signed)
Location:  [place] of Service:  Clinic (12)  Provider: Aladdin Kollmann L. Mariea Clonts, D.O., C.M.D.  Code Status: DNR Goals of Care:  Advanced Directives 02/03/2016  Does patient have an advance directive? Yes  Type of Advance Directive [place]  Does patient want to make changes to advanced directive? -  Copy of advanced directive(s) in chart? Yes     Chief Complaint  Patient presents with  . Acute Visit    pain in hand    HPI: Patient is a 82 y.o. female seen today for an acute visit for for hand pain.  She had been carrying groceries in both hands and developed pain in her right thumb.   She requested an appointment to go over her xrays.  Right thumb is swollen, red and painful at the base and she has difficulty with opposing her thumb.  Has iced it, taken an aleve in the morning and night (tylenol didn't do much).  Pain is 5/10.  She's never had gout in the past.  She has OA, but has not had inflammatory arthritis.  Past Medical History  Diagnosis Date  . Allergy   . Arthritis   . Emphysema of lung ([place])   . Cataract   . GERD (gastroesophageal reflux disease)   . Thyroid disease   . Osteoporosis   . Hypertension   . High cholesterol   . Hypothyroidism   . Insomnia   . Chronic anxiety   . Spinal stenosis   . Varicose veins   . Constipation   . Glaucoma   . Lumbar degenerative disc disease   . Diverticulosis of sigmoid colon   . Palpitations   . LBBB (left bundle branch block)   . H/O adenomatous polyp of colon     Dr. Cristina Gong  . Onychomycosis   . 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 Osteoarthritis   . Lactose intolerance     Past Surgical History  Procedure Laterality Date  . Cholecystectomy    . Joint replacement    . Abdominal hysterectomy    . Shoulder arthroscopy with rotator cuff repair Right 08/2003  . Bladder repair    . Laminectomy  02/2011    L3-L5  Dr. Ellene Route  .  Incontinence surgery    . Total hip arthroplasty      right 1999  . Colonoscopy  05/18/2012  . Cataract extraction w/ intraocular lens  implant, bilateral  7 & 8 2014  . Blephoplasty bilateral  03/10/2014  . Eye surgery    . Lumbar laminectomy/decompression microdiscectomy Right 09/21/2015    Procedure: Right Lumbar Three-FourMicrodiskectomy;  Surgeon: Kristeen Miss, MD;  Location: [facility];  Service: Neurosurgery;  Laterality: Right;  Right L3-4 Microdiskectomy  . Tonsillectomy    . Herniated disc repair      Allergies  Allergen Reactions  . Codeine Nausea Only  . Macrobid [Nitrofurantoin] Diarrhea  . Miralax [Polyethylene Glycol] Other (See Comments)    cramping  . Pain Relief [Acetaminophen] Nausea And Vomiting  . Paxil [Paroxetine Hcl] Nausea Only  . Percodan [Oxycodone-Aspirin] Itching  . Oxycodone Rash  . Oxycodone-Acetaminophen Rash  . Penicillins Rash      Medication List       This list is accurate as of: 02/17/16  3:28 PM.  Always use your most recent med list.  acetaminophen 500 MG tablet  Commonly known as:  TYLENOL  Take 1,000 mg by mouth at bedtime.     BENEFIBER DRINK MIX PO  Take by mouth. 2 tablespoons once daily.     cetirizine 10 MG tablet  Commonly known as:  ZYRTEC  Take 10 mg by mouth. Take one tablet daily as needed     CITRACAL +D3 250-107-500 MG-MG-UNIT Chew  Generic drug:  Calcium-Phosphorus-Vitamin D  Chew 1 tablet by mouth daily.     clonazePAM 0.5 MG tablet  Commonly known as:  KLONOPIN  Take 0.25 mg by mouth 2 (two) times daily as needed for anxiety.     diltiazem 240 MG 24 hr capsule  Commonly known as:  CARDIZEM CD  Take 240 mg by mouth daily.     dorzolamide-timolol 22.3-6.8 MG/ML ophthalmic solution  Commonly known as:  COSOPT  Place 1 drop into both eyes 2 (two) times daily.     ipratropium 0.06 % nasal spray  Commonly known as:  ATROVENT  Place 2 sprays into both nostrils 3 (three) times daily.      irbesartan-hydrochlorothiazide 300-12.5 MG tablet  Commonly known as:  AVALIDE  Take 1 tablet by mouth daily.     multivitamin with minerals tablet  Take 1 tablet by mouth daily.     pantoprazole 40 MG tablet  Commonly known as:  PROTONIX  Take 40 mg by mouth daily.     PROLIA 60 MG/ML Soln injection  Generic drug:  denosumab  Inject 60 mg into the skin every 6 (six) months. Administer in upper arm, thigh, or abdomen     rosuvastatin 10 MG tablet  Commonly known as:  CRESTOR  Take 5 mg by mouth at bedtime.     SYNTHROID 125 MCG tablet  Generic drug:  levothyroxine  Take one tablet by mouth daily.        Review of Systems:  Review of Systems  Constitutional: Negative for fever, chills and malaise/fatigue.  Musculoskeletal: Positive for joint pain. Negative for myalgias and falls.       No injury  Neurological: Negative for weakness.    Health Maintenance  Topic Date Due  . TETANUS/TDAP  08/20/1947  . PNA vac Low Risk Adult (2 of 2 - PCV13) 08/29/2014  . INFLUENZA VACCINE  03/29/2016  . DEXA SCAN  Completed  . ZOSTAVAX  Completed    Physical Exam: Filed Vitals:   02/17/16 1525  BP: 138/70  Pulse: 79  Temp: 98.7 F (37.1 C)  TempSrc: Oral  Weight: 142 lb (64.411 kg)  SpO2: 97%   Body mass index is 25.16 kg/(m^2). Physical Exam  Constitutional: She is oriented to person, place, and time. She appears well-developed and well-nourished. No distress.  Musculoskeletal: She exhibits tenderness.  Right base of thumb, erythematous, warm, swollen and unable to oppose thumb due to swelling; full ROM of wrist and other fingers  Neurological: She is alert and oriented to person, place, and time.  Skin: Skin is warm and dry.    Labs reviewed: Basic Metabolic Panel:  Recent Labs  07/11/15 0912 07/16/15 09/02/15 09/14/15 1427  NA 139 139 141 141  K 3.9 3.7 4.1 3.3*  CL 107  --   --  107  CO2 24  --   --  26  GLUCOSE 123*  --   --  149*  BUN 13 14 16 17     CREATININE 0.48 0.6 0.6 0.61  CALCIUM 9.5  --   --  9.4  TSH  --   --  0.70  --    Liver Function Tests:  Recent Labs  07/11/15 0912 07/16/15 09/02/15  AST 29 14 14   ALT 21 12 16   ALKPHOS 59 54  --   BILITOT 1.4*  --   --   PROT 6.7  --   --   ALBUMIN 4.0  --   --     Recent Labs  07/11/15 0912  LIPASE 25   No results for input(s): AMMONIA in the last 8760 hours. CBC:  Recent Labs  07/11/15 0912 09/02/15 09/14/15 1427  WBC 5.7 6.6 10.3  HGB 13.8 12.8 13.2  HCT 40.9 38 40.2  MCV 92.3  --  96.6  PLT 240 299 262   Lipid Panel:  Recent Labs  09/02/15  CHOL 175  HDL 71*  LDLCALC 79  TRIG 126   No results found for: HGBA1C  Procedures since last visit: Dg Wrist Complete Right  02/16/2016  CLINICAL DATA:  Lifting injury.  Pain. EXAM: RIGHT WRIST - COMPLETE 3+ VIEW COMPARISON:  No recent. FINDINGS: Diffuse bony degenerative changes. Degenerative changes most prominent about the first carpometacarpal joint. Tiny bony densities noted adjacent to the wrist, particular prominent about the first carpometacarpal joint and are most likely related to degenerative change. Tiny fracture chips cannot be excluded. No evidence of significant acute fracture. Diffuse osteopenia. IMPRESSION: Severe degenerative change. Osteopenia. No significant acute abnormality. Electronically Signed   By: [place]   On: 02/16/2016 11:44   Dg Hand Complete Right  02/16/2016  CLINICAL DATA:  Right hand and wrist pain. EXAM: RIGHT HAND - COMPLETE 3+ VIEW COMPARISON:  No recent prior. FINDINGS: Diffuse degenerative change noted of the hand and wrist. Diffuse osteopenia . No acute bony abnormality identified. No evidence of fracture dislocation. IMPRESSION: Diffuse osteopenia and degenerative change. No acute bony or joint abnormality identified. No evidence of fracture or dislocation. Electronically Signed   By: Marcello Moores  Register   On: 02/16/2016 11:42    Assessment/Plan 1. Monoarticular  arthritis -acute inflammatory arthritis of right thumb after carrying groceries -cont ice, topical agent, and aleve bid -come in for uric acid in the am  2. Thumb pain, right -suspect gout, but has no h/o the same -seems it was coincidental that it happened after carrying her groceries  Labs/tests ordered: uric acid Next appt:  03/07/2016  Beckie Viscardi L. Ashari Llewellyn, D.O. [facility] [address], [place] [address] Cell Phone (Mon-Fri 8am-5pm):  [phone] On Call:  [phone] & follow prompts after 5pm & weekends Office Phone:  [phone] Office Fax:  [phone]

## 2016-02-18 DIAGNOSIS — M1310 Monoarthritis, not elsewhere classified, unspecified site: Secondary | ICD-10-CM | POA: Diagnosis not present

## 2016-02-19 ENCOUNTER — Other Ambulatory Visit: Payer: Self-pay | Admitting: *Deleted

## 2016-02-19 ENCOUNTER — Encounter: Payer: Self-pay | Admitting: *Deleted

## 2016-02-19 ENCOUNTER — Telehealth: Payer: Self-pay | Admitting: *Deleted

## 2016-02-19 NOTE — Telephone Encounter (Signed)
Left message for patient to return my call, per written note on labs from Dr. Mariea Clonts " uric acid normal ( 5.0 ), this goes against gout, suggest short cource of prednisone for her thumb swelling/pain ( 1 week ) if it continues to be swollen and painful.

## 2016-02-24 MED ORDER — PREDNISONE 5 MG PO TABS: 5.0000 mg | ORAL_TABLET | Freq: Every day | ORAL | Status: DC

## 2016-02-24 NOTE — Telephone Encounter (Signed)
.  left message to have patient return my call.  

## 2016-02-24 NOTE — Telephone Encounter (Signed)
Patient notified and agreed. Faxed Rx to pharmacy.  

## 2016-03-07 ENCOUNTER — Other Ambulatory Visit: Payer: Medicare Other

## 2016-03-07 DIAGNOSIS — E038 Other specified hypothyroidism: Secondary | ICD-10-CM | POA: Diagnosis not present

## 2016-03-07 DIAGNOSIS — E034 Atrophy of thyroid (acquired): Secondary | ICD-10-CM | POA: Diagnosis not present

## 2016-03-07 LAB — TSH: TSH: 0.48 mIU/L

## 2016-03-08 ENCOUNTER — Encounter: Payer: Self-pay | Admitting: *Deleted

## 2016-03-15 ENCOUNTER — Encounter: Payer: Self-pay | Admitting: Internal Medicine

## 2016-04-15 DIAGNOSIS — H5340 Unspecified visual field defects: Secondary | ICD-10-CM | POA: Diagnosis not present

## 2016-04-15 DIAGNOSIS — H401122 Primary open-angle glaucoma, left eye, moderate stage: Secondary | ICD-10-CM | POA: Diagnosis not present

## 2016-04-15 DIAGNOSIS — H43813 Vitreous degeneration, bilateral: Secondary | ICD-10-CM | POA: Diagnosis not present

## 2016-04-15 DIAGNOSIS — H401112 Primary open-angle glaucoma, right eye, moderate stage: Secondary | ICD-10-CM | POA: Diagnosis not present

## 2016-05-04 ENCOUNTER — Non-Acute Institutional Stay: Payer: Medicare Other | Admitting: Internal Medicine

## 2016-05-04 ENCOUNTER — Encounter: Payer: Self-pay | Admitting: Internal Medicine

## 2016-05-04 VITALS — BP 120/60 | HR 62 | Temp 98.5°F | Wt 146.0 lb

## 2016-05-04 DIAGNOSIS — J449 Chronic obstructive pulmonary disease, unspecified: Secondary | ICD-10-CM

## 2016-05-04 DIAGNOSIS — M810 Age-related osteoporosis without current pathological fracture: Secondary | ICD-10-CM

## 2016-05-04 DIAGNOSIS — E739 Lactose intolerance, unspecified: Secondary | ICD-10-CM

## 2016-05-04 DIAGNOSIS — R002 Palpitations: Secondary | ICD-10-CM | POA: Diagnosis not present

## 2016-05-04 DIAGNOSIS — I10 Essential (primary) hypertension: Secondary | ICD-10-CM | POA: Diagnosis not present

## 2016-05-04 DIAGNOSIS — E034 Atrophy of thyroid (acquired): Secondary | ICD-10-CM | POA: Diagnosis not present

## 2016-05-04 DIAGNOSIS — K589 Irritable bowel syndrome without diarrhea: Secondary | ICD-10-CM | POA: Diagnosis not present

## 2016-05-04 DIAGNOSIS — E038 Other specified hypothyroidism: Secondary | ICD-10-CM | POA: Diagnosis not present

## 2016-05-04 NOTE — Progress Notes (Addendum)
Location:   [place] of Service:  Clinic (12)  Provider: Evadna Donaghy L. Mariea Clonts, D.O., C.M.D.  Code Status: DNR Goals of Care:  Advanced Directives 05/04/2016  Does patient have an advance directive? Yes  Type of Advance Directive [place]  Does patient want to make changes to advanced directive? -  Copy of advanced directive(s) in chart? Yes     Chief Complaint  Patient presents with  . Medical Management of Chronic Issues    3 mth follow-up    HPI: Patient is a 82 y.o. female seen today for medical management of chronic diseases/ 3 month f/u..    BP at goal.  She has been visiting [place] due to some heart palpitations.  Has had some ni the pst but not as often as recently.  Has been having them almost daily and especially later in the day when she is tired.  Occasionally, she is short of breath then also.  No chest pain.  Will just be a few beats, then stop.  When she is walking in the evening is when it happens.  Just stops quickly.  Had her stress test and echo in Jan at Dr. Irven Shelling office.    She has quit eating cheese and yogurt.  She wonders if she's deficient in something like iron b/c her tongue is sore on the tip.  Did find lactose free yogurt.  Esmond Plants.  She does take a multivitamin.    Having more difficulty with irritable bowel.  Will be constipated, then suddenly have diarrhea.  Strong coffee did it.  Is drinking more water, eating more fruits and veggies.  She just started back taking her align.  She did try a medication for pancreatic insufficiency and no success.  Also med was expensive.  Lactose free diet helped.  She has used benefiber for years. She'd rather do more of that than something new.  Weight has gone back up some.    Right wrist and thumb got better with low dose of prednisone.  Arthritis of fingers.  Takes one aleve before bed.    Is due for bone density in December of this year.  Needs prolia reapproved.    Past  Medical History:  Diagnosis Date  . Allergy   . Arthritis   . 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 Cataract   . Chronic anxiety   . Constipation   . Diverticulosis of sigmoid colon   . Emphysema of lung ([place])   . GERD (gastroesophageal reflux disease)   . Glaucoma   . H/O adenomatous polyp of colon    Dr. Cristina Gong  . High cholesterol   . Hypertension   . Hypothyroidism   . Insomnia   . Lactose intolerance   . LBBB (left bundle branch block)   . Lumbar degenerative disc disease   . Onychomycosis   . Osteoarthritis   . Osteoporosis   . Palpitations   . Spinal stenosis   . Thyroid disease   . Varicose veins     Past Surgical History:  Procedure Laterality Date  . ABDOMINAL HYSTERECTOMY    . BLADDER REPAIR    . blephoplasty bilateral  03/10/2014  . CATARACT EXTRACTION W/ INTRAOCULAR LENS  IMPLANT, BILATERAL  7 & 8 2014  . CHOLECYSTECTOMY    . COLONOSCOPY  05/18/2012  . EYE SURGERY    . herniated disc repair    . INCONTINENCE  SURGERY    . JOINT REPLACEMENT    . LAMINECTOMY  02/2011   L3-L5  Dr. Ellene Route  . LUMBAR LAMINECTOMY/DECOMPRESSION MICRODISCECTOMY Right 09/21/2015   Procedure: Right Lumbar Three-FourMicrodiskectomy;  Surgeon: Kristeen Miss, MD;  Location: [facility];  Service: Neurosurgery;  Laterality: Right;  Right L3-4 Microdiskectomy  . SHOULDER ARTHROSCOPY WITH ROTATOR CUFF REPAIR Right 08/2003  . TONSILLECTOMY    . TOTAL HIP ARTHROPLASTY     right 1999    Allergies  Allergen Reactions  . Codeine Nausea Only  . Macrobid [Nitrofurantoin] Diarrhea  . Miralax [Polyethylene Glycol] Other (See Comments)    cramping  . Pain Relief [Acetaminophen] Nausea And Vomiting  . Paxil [Paroxetine Hcl] Nausea Only  . Percodan [Oxycodone-Aspirin] Itching  . Oxycodone Rash  . Oxycodone-Acetaminophen Rash  . Penicillins Rash      Medication List       Accurate as of 05/04/16  1:47 PM. Always use your most recent  med list.          bifidobacterium infantis capsule Take 1 capsule by mouth daily.   cetirizine 10 MG tablet Commonly known as:  ZYRTEC Take 10 mg by mouth. Take one tablet daily as needed   CITRACAL +D3 250-107-500 MG-MG-UNIT Chew Generic drug:  Calcium-Phosphorus-Vitamin D Chew 1 tablet by mouth daily.   clonazePAM 0.5 MG tablet Commonly known as:  KLONOPIN Take 0.25 mg by mouth 2 (two) times daily as needed for anxiety.   diltiazem 240 MG 24 hr capsule Commonly known as:  CARDIZEM CD Take 240 mg by mouth daily.   dorzolamide-timolol 22.3-6.8 MG/ML ophthalmic solution Commonly known as:  COSOPT Place 1 drop into both eyes 2 (two) times daily.   fexofenadine 180 MG tablet Commonly known as:  ALLEGRA Take 180 mg by mouth daily.   ipratropium 0.06 % nasal spray Commonly known as:  ATROVENT Place 2 sprays into both nostrils 3 (three) times daily.   irbesartan-hydrochlorothiazide 300-12.5 MG tablet Commonly known as:  AVALIDE Take 1 tablet by mouth daily.   LACTASE PO Take 1 tablet by mouth as needed (when eating dairy products).   multivitamin with minerals tablet Take 1 tablet by mouth daily.   pantoprazole 40 MG tablet Commonly known as:  PROTONIX Take 40 mg by mouth daily.   PROLIA 60 MG/ML Soln injection Generic drug:  denosumab Inject 60 mg into the skin every 6 (six) months. Administer in upper arm, thigh, or abdomen   rosuvastatin 10 MG tablet Commonly known as:  CRESTOR Take 5 mg by mouth at bedtime.   SYNTHROID 125 MCG tablet Generic drug:  levothyroxine Take one tablet by mouth daily.       Review of Systems:  Review of Systems  Constitutional: Negative for chills, fever and malaise/fatigue.  HENT:       Tip of tongue sore  Respiratory: Positive for shortness of breath.        Sometimes sob with palpitations  Cardiovascular: Positive for palpitations. Negative for chest pain.  Gastrointestinal: Positive for constipation and diarrhea.  Negative for blood in stool and melena.  Genitourinary: Negative for dysuria.  Musculoskeletal: Negative for falls.  Neurological: Negative for dizziness and weakness.  Psychiatric/Behavioral: Negative for depression and memory loss.    Health Maintenance  Topic Date Due  . TETANUS/TDAP  08/20/1947  . PNA vac Low Risk Adult (2 of 2 - PCV13) 08/29/2014  . INFLUENZA VACCINE  03/29/2016  . DEXA SCAN  Completed  . ZOSTAVAX  Completed  Physical Exam: Vitals:   05/04/16 1331  BP: 120/60  Pulse: 62  Temp: 98.5 F (36.9 C)  TempSrc: Oral  SpO2: 97%  Weight: 146 lb (66.2 kg)   Body mass index is 25.86 kg/m. Physical Exam  Constitutional: She is oriented to person, place, and time. She appears well-developed and well-nourished.  Cardiovascular: Normal rate, regular rhythm, normal heart sounds and intact distal pulses.   Pulmonary/Chest: Effort normal and breath sounds normal.  Abdominal: Soft. Bowel sounds are normal. She exhibits no distension. There is no tenderness.  Musculoskeletal: Normal range of motion.  Neurological: She is alert and oriented to person, place, and time.  Skin: Skin is warm and dry.  Psychiatric: She has a normal mood and affect.    Labs reviewed: Basic Metabolic Panel:  Recent Labs  07/11/15 0912 07/16/15 09/02/15 09/14/15 1427 03/07/16 1146  NA 139 139 141 141  --   K 3.9 3.7 4.1 3.3*  --   CL 107  --   --  107  --   CO2 24  --   --  26  --   GLUCOSE 123*  --   --  149*  --   BUN 13 14 16 17   --   CREATININE 0.48 0.6 0.6 0.61  --   CALCIUM 9.5  --   --  9.4  --   TSH  --   --  0.70  --  0.48   Liver Function Tests:  Recent Labs  07/11/15 0912 07/16/15 09/02/15  AST 29 14 14   ALT 21 12 16   ALKPHOS 59 54  --   BILITOT 1.4*  --   --   PROT 6.7  --   --   ALBUMIN 4.0  --   --     Recent Labs  07/11/15 0912  LIPASE 25   No results for input(s): AMMONIA in the last 8760 hours. CBC:  Recent Labs  07/11/15 0912 09/02/15  09/14/15 1427  WBC 5.7 6.6 10.3  HGB 13.8 12.8 13.2  HCT 40.9 38 40.2  MCV 92.3  --  96.6  PLT 240 299 262   Lipid Panel:  Recent Labs  09/02/15  CHOL 175  HDL 71*  LDLCALC 79  TRIG 126   Assessment/Plan 1. Essential hypertension - bp at goal with current medication - CBC with Differential/Platelet; Future - COMPLETE METABOLIC PANEL WITH GFR; Future  2. Hypothyroidism due to acquired atrophy of thyroid -TSH wnl, cont same synthroid  3. Senile osteoporosis -will recheck bone density after appt for annual in Jan -needs prolia injection (overdue)--need to get it authorized with her insurance again (last was 11/16)  4. Lactose intolerance in adult - cont lactose free foods/avoidance - f/u labs - COMPLETE METABOLIC PANEL WITH GFR; Future  5. Chronic obstructive pulmonary disease, unspecified COPD type ([place]) -cont current treatment--no problems related to her COPD at this time  6.  IBS -increase benefiber Cont align  Labs/tests ordered:  Cbc, cmp next week at office Next appt:  January annual exam  Keaundra Stehle L. Breeona Waid, D.O. [facility] [address], [place] [address] Cell Phone (Mon-Fri 8am-5pm):  [phone] On Call:  [phone] & follow prompts after 5pm & weekends Office Phone:  [phone] Office Fax:  [phone]  Addendum:  Palpitations:  Have become more frequent--advised she see Dr. Einar Gip as these are happening most afternoons and associated with shortness of breath.  They are short-lived, but worry her.  She  may need a monitor placed.

## 2016-05-10 ENCOUNTER — Encounter: Payer: Self-pay | Admitting: *Deleted

## 2016-05-11 DIAGNOSIS — E7800 Pure hypercholesterolemia, unspecified: Secondary | ICD-10-CM | POA: Diagnosis not present

## 2016-05-11 DIAGNOSIS — R9431 Abnormal electrocardiogram [ECG] [EKG]: Secondary | ICD-10-CM | POA: Diagnosis not present

## 2016-05-11 DIAGNOSIS — I10 Essential (primary) hypertension: Secondary | ICD-10-CM | POA: Diagnosis not present

## 2016-05-11 DIAGNOSIS — R002 Palpitations: Secondary | ICD-10-CM | POA: Diagnosis not present

## 2016-05-12 ENCOUNTER — Other Ambulatory Visit (INDEPENDENT_AMBULATORY_CARE_PROVIDER_SITE_OTHER): Payer: Medicare Other

## 2016-05-12 DIAGNOSIS — I10 Essential (primary) hypertension: Secondary | ICD-10-CM | POA: Diagnosis not present

## 2016-05-12 DIAGNOSIS — E739 Lactose intolerance, unspecified: Secondary | ICD-10-CM

## 2016-05-12 DIAGNOSIS — Z23 Encounter for immunization: Secondary | ICD-10-CM | POA: Diagnosis not present

## 2016-05-12 LAB — CBC WITH DIFFERENTIAL/PLATELET
Basophils Absolute: 68 cells/uL (ref 0–200)
Basophils Relative: 1 %
Eosinophils Absolute: 612 cells/uL — ABNORMAL HIGH (ref 15–500)
Eosinophils Relative: 9 %
HCT: 38.5 % (ref 35.0–45.0)
Hemoglobin: 12.5 g/dL (ref 11.7–15.5)
Lymphocytes Relative: 20 %
Lymphs Abs: 1360 cells/uL (ref 850–3900)
MCH: 29.6 pg (ref 27.0–33.0)
MCHC: 32.5 g/dL (ref 32.0–36.0)
MCV: 91 fL (ref 80.0–100.0)
MPV: 11 fL (ref 7.5–12.5)
Monocytes Absolute: 544 cells/uL (ref 200–950)
Monocytes Relative: 8 %
Neutro Abs: 4216 cells/uL (ref 1500–7800)
Neutrophils Relative %: 62 %
Platelets: 265 10*3/uL (ref 140–400)
RBC: 4.23 MIL/uL (ref 3.80–5.10)
RDW: 13.9 % (ref 11.0–15.0)
WBC: 6.8 10*3/uL (ref 3.8–10.8)

## 2016-05-12 NOTE — Addendum Note (Signed)
Addended by: Estell Harpin T on: 05/12/2016 01:57 PM   Modules accepted: Orders

## 2016-05-13 LAB — COMPLETE METABOLIC PANEL WITH GFR
ALT: 11 U/L (ref 6–29)
AST: 18 U/L (ref 10–35)
Albumin: 4.3 g/dL (ref 3.6–5.1)
Alkaline Phosphatase: 59 U/L (ref 33–130)
BUN: 18 mg/dL (ref 7–25)
CO2: 25 mmol/L (ref 20–31)
Calcium: 9.8 mg/dL (ref 8.6–10.4)
Chloride: 108 mmol/L (ref 98–110)
Creat: 0.81 mg/dL (ref 0.60–0.88)
GFR, Est African American: 77 mL/min (ref >=60)
GFR, Est Non African American: 66 mL/min (ref >=60)
Glucose, Bld: 112 mg/dL — ABNORMAL HIGH (ref 65–99)
Potassium: 4.2 mmol/L (ref 3.5–5.3)
Sodium: 143 mmol/L (ref 135–146)
Total Bilirubin: 0.6 mg/dL (ref 0.2–1.2)
Total Protein: 6.1 g/dL (ref 6.1–8.1)

## 2016-05-19 ENCOUNTER — Other Ambulatory Visit: Payer: Self-pay | Admitting: Internal Medicine

## 2016-05-20 ENCOUNTER — Telehealth: Payer: Self-pay | Admitting: *Deleted

## 2016-05-20 NOTE — Telephone Encounter (Signed)
Patient called and stated that she never received the results from her labs done on 05/12/16. Labs reviewed with patient.

## 2016-06-08 DIAGNOSIS — R002 Palpitations: Secondary | ICD-10-CM | POA: Diagnosis not present

## 2016-06-08 DIAGNOSIS — R9431 Abnormal electrocardiogram [ECG] [EKG]: Secondary | ICD-10-CM | POA: Diagnosis not present

## 2016-06-08 DIAGNOSIS — R0602 Shortness of breath: Secondary | ICD-10-CM | POA: Diagnosis not present

## 2016-06-08 DIAGNOSIS — I10 Essential (primary) hypertension: Secondary | ICD-10-CM | POA: Diagnosis not present

## 2016-06-21 DIAGNOSIS — Z85828 Personal history of other malignant neoplasm of skin: Secondary | ICD-10-CM | POA: Diagnosis not present

## 2016-06-21 DIAGNOSIS — D225 Melanocytic nevi of trunk: Secondary | ICD-10-CM | POA: Diagnosis not present

## 2016-06-21 DIAGNOSIS — Z23 Encounter for immunization: Secondary | ICD-10-CM | POA: Diagnosis not present

## 2016-06-21 DIAGNOSIS — D2230 Melanocytic nevi of unspecified part of face: Secondary | ICD-10-CM | POA: Diagnosis not present

## 2016-06-21 DIAGNOSIS — D229 Melanocytic nevi, unspecified: Secondary | ICD-10-CM | POA: Diagnosis not present

## 2016-07-18 ENCOUNTER — Other Ambulatory Visit: Payer: Self-pay

## 2016-07-18 MED ORDER — IRBESARTAN-HYDROCHLOROTHIAZIDE 300-12.5 MG PO TABS: 1.0000 | ORAL_TABLET | Freq: Every day | ORAL | 6 refills | Status: DC

## 2016-08-15 ENCOUNTER — Inpatient Hospital Stay (HOSPITAL_COMMUNITY)
Admission: EM | Admit: 2016-08-15 | Discharge: 2016-08-18 | DRG: 243 | Disposition: A | Discharge status: Home / Self Care | Payer: Medicare Other | Attending: Internal Medicine | Admitting: Internal Medicine

## 2016-08-15 ENCOUNTER — Encounter (HOSPITAL_COMMUNITY): Payer: Self-pay

## 2016-08-15 ENCOUNTER — Emergency Department (HOSPITAL_COMMUNITY): Payer: Medicare Other

## 2016-08-15 DIAGNOSIS — I442 Atrioventricular block, complete: Secondary | ICD-10-CM | POA: Diagnosis not present

## 2016-08-15 DIAGNOSIS — F411 Generalized anxiety disorder: Secondary | ICD-10-CM | POA: Diagnosis not present

## 2016-08-15 DIAGNOSIS — J449 Chronic obstructive pulmonary disease, unspecified: Secondary | ICD-10-CM | POA: Diagnosis present

## 2016-08-15 DIAGNOSIS — E034 Atrophy of thyroid (acquired): Secondary | ICD-10-CM | POA: Diagnosis present

## 2016-08-15 DIAGNOSIS — E876 Hypokalemia: Secondary | ICD-10-CM | POA: Diagnosis present

## 2016-08-15 DIAGNOSIS — R001 Bradycardia, unspecified: Secondary | ICD-10-CM | POA: Diagnosis not present

## 2016-08-15 DIAGNOSIS — E785 Hyperlipidemia, unspecified: Secondary | ICD-10-CM | POA: Diagnosis not present

## 2016-08-15 DIAGNOSIS — R0989 Other specified symptoms and signs involving the circulatory and respiratory systems: Secondary | ICD-10-CM | POA: Diagnosis not present

## 2016-08-15 DIAGNOSIS — M5136 Other intervertebral disc degeneration, lumbar region: Secondary | ICD-10-CM | POA: Diagnosis present

## 2016-08-15 DIAGNOSIS — R531 Weakness: Secondary | ICD-10-CM | POA: Diagnosis not present

## 2016-08-15 DIAGNOSIS — Z888 Allergy status to other drugs, medicaments and biological substances status: Secondary | ICD-10-CM

## 2016-08-15 DIAGNOSIS — Z8249 Family history of ischemic heart disease and other diseases of the circulatory system: Secondary | ICD-10-CM

## 2016-08-15 DIAGNOSIS — J939 Pneumothorax, unspecified: Secondary | ICD-10-CM

## 2016-08-15 DIAGNOSIS — I10 Essential (primary) hypertension: Secondary | ICD-10-CM | POA: Diagnosis not present

## 2016-08-15 DIAGNOSIS — I081 Rheumatic disorders of both mitral and tricuspid valves: Secondary | ICD-10-CM | POA: Diagnosis present

## 2016-08-15 DIAGNOSIS — Z885 Allergy status to narcotic agent status: Secondary | ICD-10-CM

## 2016-08-15 DIAGNOSIS — K219 Gastro-esophageal reflux disease without esophagitis: Secondary | ICD-10-CM | POA: Diagnosis present

## 2016-08-15 DIAGNOSIS — I441 Atrioventricular block, second degree: Secondary | ICD-10-CM | POA: Diagnosis present

## 2016-08-15 DIAGNOSIS — Z959 Presence of cardiac and vascular implant and graft, unspecified: Secondary | ICD-10-CM

## 2016-08-15 DIAGNOSIS — R0602 Shortness of breath: Secondary | ICD-10-CM | POA: Diagnosis not present

## 2016-08-15 DIAGNOSIS — Z881 Allergy status to other antibiotic agents status: Secondary | ICD-10-CM

## 2016-08-15 DIAGNOSIS — Z886 Allergy status to analgesic agent status: Secondary | ICD-10-CM

## 2016-08-15 DIAGNOSIS — R404 Transient alteration of awareness: Secondary | ICD-10-CM | POA: Diagnosis not present

## 2016-08-15 DIAGNOSIS — Z880 Allergy status to penicillin: Secondary | ICD-10-CM

## 2016-08-15 LAB — CBC WITH DIFFERENTIAL/PLATELET
BASOS ABS: 0.1 10*3/uL (ref 0.0–0.1)
BASOS PCT: 1 %
EOS PCT: 4 %
Eosinophils Absolute: 0.3 10*3/uL (ref 0.0–0.7)
HEMATOCRIT: 40.8 % (ref 36.0–46.0)
Hemoglobin: 13.2 g/dL (ref 12.0–15.0)
Lymphocytes Relative: 21 %
Lymphs Abs: 1.7 10*3/uL (ref 0.7–4.0)
MCH: 29.9 pg (ref 26.0–34.0)
MCHC: 32.4 g/dL (ref 30.0–36.0)
MCV: 92.5 fL (ref 78.0–100.0)
MONO ABS: 0.5 10*3/uL (ref 0.1–1.0)
Monocytes Relative: 7 %
NEUTROS ABS: 5.6 10*3/uL (ref 1.7–7.7)
Neutrophils Relative %: 67 %
PLATELETS: 218 10*3/uL (ref 150–400)
RBC: 4.41 MIL/uL (ref 3.87–5.11)
RDW: 14.1 % (ref 11.5–15.5)
WBC: 8.2 10*3/uL (ref 4.0–10.5)

## 2016-08-15 LAB — URINALYSIS, ROUTINE W REFLEX MICROSCOPIC
Bilirubin Urine: NEGATIVE
GLUCOSE, UA: NEGATIVE mg/dL
HGB URINE DIPSTICK: NEGATIVE
KETONES UR: NEGATIVE mg/dL
Leukocytes, UA: NEGATIVE
Nitrite: NEGATIVE
PROTEIN: NEGATIVE mg/dL
Specific Gravity, Urine: 1.003 — ABNORMAL LOW (ref 1.005–1.030)
pH: 7 (ref 5.0–8.0)

## 2016-08-15 LAB — BASIC METABOLIC PANEL
Anion gap: 8 (ref 5–15)
BUN: 17 mg/dL (ref 6–20)
CO2: 26 mmol/L (ref 22–32)
Calcium: 10.4 mg/dL — ABNORMAL HIGH (ref 8.9–10.3)
Chloride: 109 mmol/L (ref 101–111)
Creatinine, Ser: 0.84 mg/dL (ref 0.44–1.00)
Glucose, Bld: 121 mg/dL — ABNORMAL HIGH (ref 65–99)
POTASSIUM: 3.8 mmol/L (ref 3.5–5.1)
SODIUM: 143 mmol/L (ref 135–145)

## 2016-08-15 LAB — T4, FREE: Free T4: 1.31 ng/dL — ABNORMAL HIGH (ref 0.61–1.12)

## 2016-08-15 LAB — TSH: TSH: 4.876 u[IU]/mL — AB (ref 0.350–4.500)

## 2016-08-15 LAB — MAGNESIUM: MAGNESIUM: 2.1 mg/dL (ref 1.7–2.4)

## 2016-08-15 LAB — I-STAT TROPONIN, ED: Troponin i, poc: 0.01 ng/mL (ref 0.00–0.08)

## 2016-08-15 MED ORDER — IPRATROPIUM BROMIDE 0.06 % NA SOLN
2.0000 | Freq: Three times a day (TID) | NASAL | Status: DC
  Administered 2016-08-15 – 2016-08-18 (×3): 2 via NASAL
  Filled 2016-08-15: qty 15

## 2016-08-15 MED ORDER — LORATADINE 10 MG PO TABS
10.0000 mg | ORAL_TABLET | Freq: Every day | ORAL | Status: DC
Start: 2016-08-15 — End: 2016-08-15
  Filled 2016-08-15: qty 1

## 2016-08-15 MED ORDER — CLONAZEPAM 0.5 MG PO TABS: 0.2500 mg | ORAL_TABLET | Freq: Two times a day (BID) | ORAL | Status: DC | PRN

## 2016-08-15 MED ORDER — CALCIUM-PHOSPHORUS-VITAMIN D 250-107-500 MG-MG-UNIT PO CHEW: 1.0000 | CHEWABLE_TABLET | Freq: Every day | ORAL | Status: DC

## 2016-08-15 MED ORDER — IRBESARTAN-HYDROCHLOROTHIAZIDE 300-12.5 MG PO TABS: 1.0000 | ORAL_TABLET | Freq: Every day | ORAL | Status: DC

## 2016-08-15 MED ORDER — ONDANSETRON HCL 4 MG PO TABS: 4.0000 mg | ORAL_TABLET | Freq: Four times a day (QID) | ORAL | Status: DC | PRN

## 2016-08-15 MED ORDER — HYDROCHLOROTHIAZIDE 12.5 MG PO CAPS
12.5000 mg | ORAL_CAPSULE | Freq: Every day | ORAL | Status: DC
  Administered 2016-08-16 – 2016-08-18 (×3): 12.5 mg via ORAL
  Filled 2016-08-15 (×3): qty 1

## 2016-08-15 MED ORDER — LEVOTHYROXINE SODIUM 25 MCG PO TABS
125.0000 ug | ORAL_TABLET | Freq: Every day | ORAL | Status: DC
  Administered 2016-08-16 – 2016-08-18 (×3): 125 ug via ORAL
  Filled 2016-08-15 (×3): qty 1

## 2016-08-15 MED ORDER — ENOXAPARIN SODIUM 30 MG/0.3ML ~~LOC~~ SOLN: 30.0000 mg | SUBCUTANEOUS | Status: DC

## 2016-08-15 MED ORDER — CALCIUM CARBONATE-VITAMIN D 500-200 MG-UNIT PO TABS
1.0000 | ORAL_TABLET | Freq: Every day | ORAL | Status: DC
  Administered 2016-08-16 – 2016-08-18 (×3): 1 via ORAL
  Filled 2016-08-15 (×3): qty 1

## 2016-08-15 MED ORDER — ONDANSETRON HCL 4 MG/2ML IJ SOLN: 4.0000 mg | Freq: Four times a day (QID) | INTRAMUSCULAR | Status: DC | PRN

## 2016-08-15 MED ORDER — ADULT MULTIVITAMIN W/MINERALS CH
1.0000 | ORAL_TABLET | Freq: Every day | ORAL | Status: DC
  Administered 2016-08-16 – 2016-08-18 (×3): 1 via ORAL
  Filled 2016-08-15 (×3): qty 1

## 2016-08-15 MED ORDER — SODIUM CHLORIDE 0.9% FLUSH
3.0000 mL | Freq: Two times a day (BID) | INTRAVENOUS | Status: DC
  Administered 2016-08-16: 3 mL via INTRAVENOUS

## 2016-08-15 MED ORDER — FLEET ENEMA 7-19 GM/118ML RE ENEM: 1.0000 | ENEMA | Freq: Once | RECTAL | Status: DC | PRN

## 2016-08-15 MED ORDER — SODIUM CHLORIDE 0.9 % IV SOLN
INTRAVENOUS | Status: AC
  Administered 2016-08-15: 20:00:00 via INTRAVENOUS

## 2016-08-15 MED ORDER — PANTOPRAZOLE SODIUM 40 MG PO TBEC
40.0000 mg | DELAYED_RELEASE_TABLET | Freq: Every day | ORAL | Status: DC
  Administered 2016-08-15 – 2016-08-17 (×3): 40 mg via ORAL
  Filled 2016-08-15 (×4): qty 1

## 2016-08-15 MED ORDER — ALIGN PO CAPS: 1.0000 | ORAL_CAPSULE | Freq: Every day | ORAL | Status: DC

## 2016-08-15 MED ORDER — DORZOLAMIDE HCL-TIMOLOL MAL 2-0.5 % OP SOLN
1.0000 [drp] | Freq: Two times a day (BID) | OPHTHALMIC | Status: DC
  Filled 2016-08-15: qty 10

## 2016-08-15 MED ORDER — IRBESARTAN 300 MG PO TABS
300.0000 mg | ORAL_TABLET | Freq: Every day | ORAL | Status: DC
  Administered 2016-08-16 – 2016-08-18 (×3): 300 mg via ORAL
  Filled 2016-08-15 (×3): qty 1

## 2016-08-15 MED ORDER — ROSUVASTATIN CALCIUM 10 MG PO TABS
5.0000 mg | ORAL_TABLET | Freq: Every day | ORAL | Status: DC
  Administered 2016-08-15 – 2016-08-17 (×3): 5 mg via ORAL
  Filled 2016-08-15 (×3): qty 1

## 2016-08-15 MED ORDER — HYDRALAZINE HCL 20 MG/ML IJ SOLN: 5.0000 mg | INTRAMUSCULAR | Status: DC | PRN

## 2016-08-15 MED ORDER — ENOXAPARIN SODIUM 40 MG/0.4ML ~~LOC~~ SOLN
40.0000 mg | SUBCUTANEOUS | Status: DC
  Administered 2016-08-15: 40 mg via SUBCUTANEOUS
  Filled 2016-08-15: qty 0.4

## 2016-08-15 NOTE — ED Triage Notes (Signed)
Pt brought in by EMS due to having weakness and fatigue for about one month. Pt endorse dyspnea on exertion. Pt denies dizziness or CP. Pt a&ox4. Pt lives at [facility] independent living facility.

## 2016-08-15 NOTE — H&P (Signed)
History and Physical    Destiny [name] [id number] DOB: 06-Apr-1928 DOA: 08/15/2016  PCP: Hollace Kinnier, DO Patient coming from: Home[facility]  Chief Complaint: Generalized weakness.  HPI: Destiny [name] is a very pleasant 82 y.o. female with medical history significant for hypertension, hyperlipidemia, hypothyroidism, GERD presents to the emergency Department chief complaint generalized fatigue. Initial evaluation reveals bradycardia and EKG with2:1 block.  Information is obtained from the patient. She reports a gradual worsening of generalized weakness over the last 4 weeks. Associated symptoms include increased fatigue and constant chills. She denies chest pain palpitations shortness of breath headache dizziness syncope or near-syncope. She denies any fever chills cough abdominal pain nausea vomiting diarrhea. She denies dysuria hematuria frequency or urgency. She reports seeing Dr. Willaim Bane her cardiologist "just the other day" for irregular heartbeat. She denies any medication changes or diet changes at that time   ED Course: The emergency department she's afebrile blood pressure high end of normal heart rate range 35-4. Case discussed with cardiology in the emergency department who recommended holding diltiazem monitoring on telemetry to see response and possible need for pacemaker if no resolution.  Review of Systems: As per HPI otherwise 10 point review of systems negative.   Ambulatory Status: He relates independently. Is independent with ADLs  Past Medical History:  Diagnosis Date  . Allergy   . Arthritis   . 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 Cataract   . Chronic anxiety   . Constipation   . Diverticulosis of sigmoid colon   . Emphysema of lung ([place])   . GERD (gastroesophageal reflux disease)   . Glaucoma   . H/O adenomatous polyp of colon    Dr. Cristina Gong  . High cholesterol   .  Hypertension   . Hypothyroidism   . Insomnia   . Lactose intolerance   . LBBB (left bundle branch block)   . Lumbar degenerative disc disease   . Onychomycosis   . Osteoarthritis   . Osteoporosis   . Palpitations   . Spinal stenosis   . Thyroid disease   . Varicose veins     Past Surgical History:  Procedure Laterality Date  . ABDOMINAL HYSTERECTOMY    . BLADDER REPAIR    . blephoplasty bilateral  03/10/2014  . CATARACT EXTRACTION W/ INTRAOCULAR LENS  IMPLANT, BILATERAL  7 & 8 2014  . CHOLECYSTECTOMY    . COLONOSCOPY  05/18/2012  . EYE SURGERY    . herniated disc repair    . INCONTINENCE SURGERY    . JOINT REPLACEMENT    . LAMINECTOMY  02/2011   L3-L5  Dr. Ellene Route  . LUMBAR LAMINECTOMY/DECOMPRESSION MICRODISCECTOMY Right 09/21/2015   Procedure: Right Lumbar Three-FourMicrodiskectomy;  Surgeon: Kristeen Miss, MD;  Location: [facility];  Service: Neurosurgery;  Laterality: Right;  Right L3-4 Microdiskectomy  . SHOULDER ARTHROSCOPY WITH ROTATOR CUFF REPAIR Right 08/2003  . TONSILLECTOMY    . TOTAL HIP ARTHROPLASTY     right 21    Social History   Social History  . Marital status: Widowed    Spouse name: N/A  . Number of children: N/A  . Years of education: N/A   Occupational History  . Not on file.   Social History Main Topics  . Smoking status: Never Smoker  . Smokeless tobacco: Never Used  . Alcohol use No  . Drug use: No  . Sexual activity: No   Other  Topics Concern  . Not on file   Social History Narrative   Lives at [facility]   Caffeine 1-2 cups daily   Exercise water aerobics, walking   Never smoked   POA         Diet:       Do you drink/ eat things with caffeine? Yes      Marital status:    Widow                           What year were you married ? 1957      Do you live in a house, apartment,assistred living, condo, trailer, etc.)?[facility]      Is it one or more stories?       How many persons live in your  home ? 1      Do you have any pets in your home ?(please list)0       Current or past profession: Social Worker      Do you exercise?  Yes                            Type & how often:  Water Aerobic, 3-4 hours per week       Do you have a living will? Yes      Do you have a DNR form? Yes                       If not, do you want to discuss one?       Do you have signed POA?HPOA forms?                 If so, please bring to your        appointment             Allergies  Allergen Reactions  . Codeine Nausea Only  . Macrobid [Nitrofurantoin] Diarrhea  . Miralax [Polyethylene Glycol] Other (See Comments)    cramping  . Pain Relief [Acetaminophen] Nausea And Vomiting  . Paxil [Paroxetine Hcl] Nausea Only  . Percodan [Oxycodone-Aspirin] Itching  . Oxycodone Rash  . Oxycodone-Acetaminophen Rash  . Penicillins Rash    Family History  Problem Relation Age of Onset  . Arthritis Mother   . Osteoporosis Mother   . Heart attack Father     Prior to Admission medications   Medication Sig Start Date End Date Taking? Authorizing Provider  bifidobacterium infantis (ALIGN) capsule Take 1 capsule by mouth daily.    Historical Provider, MD  Calcium-Phosphorus-Vitamin D (CITRACAL +D3) [id number] MG-MG-UNIT CHEW Chew 1 tablet by mouth daily.    Historical Provider, MD  cetirizine (ZYRTEC) 10 MG tablet Take 10 mg by mouth. Take one tablet daily as needed    Historical Provider, MD  clonazePAM (KLONOPIN) 0.5 MG tablet Take 0.25 mg by mouth 2 (two) times daily as needed for anxiety.     Historical Provider, MD  denosumab (PROLIA) 60 MG/ML SOLN injection Inject 60 mg into the skin every 6 (six) months. Administer in upper arm, thigh, or abdomen    Historical Provider, MD  diltiazem (CARDIZEM CD) 240 MG 24 hr capsule Take 240 mg by mouth daily.  03/30/10   Historical Provider, MD  dorzolamide-timolol (COSOPT) 22.3-6.8 MG/ML ophthalmic solution Place 1 drop into both eyes 2 (two) times daily.  07/07/15   Historical Provider,  MD  fexofenadine (ALLEGRA) 180 MG tablet Take 180 mg by mouth daily.    Historical Provider, MD  ipratropium (ATROVENT) 0.06 % nasal spray Place 2 sprays into both nostrils 3 (three) times daily.    Historical Provider, MD  irbesartan-hydrochlorothiazide (AVALIDE) 300-12.5 MG tablet Take 1 tablet by mouth daily. 07/18/16   Tiffany L Reed, DO  LACTASE PO Take 1 tablet by mouth as needed (when eating dairy products).    Historical Provider, MD  Multiple Vitamins-Minerals (MULTIVITAMIN WITH MINERALS) tablet Take 1 tablet by mouth daily.    Historical Provider, MD  pantoprazole (PROTONIX) 40 MG tablet Take 40 mg by mouth daily.    Historical Provider, MD  rosuvastatin (CRESTOR) 10 MG tablet Take 5 mg by mouth at bedtime.     Historical Provider, MD  SYNTHROID 125 MCG tablet TAKE 1 TABLET BY MOUTH EVERY DAY 05/19/16   Gayland Curry, DO    Physical Exam: Vitals:   08/15/16 1215 08/15/16 1230 08/15/16 1245 08/15/16 1300  BP: 179/66 (!) 163/54 (!) 159/52 (!) 156/60  Pulse: (!) 37 (!) 42 (!) 41 (!) 37  Resp: 25 18 17 18   Temp:    97.7 F (36.5 C)  TempSrc:    Oral  SpO2: 97% 96% 96% 96%  Weight:      Height:         General:  Appears calm and comfortable, slightly pale Eyes:  PERRL, EOMI, normal lids, iris ENT:  grossly normal hearing, lips & tongue, mucous membranes of her mouth are slightly pale somewhat dry Neck:  no LAD, masses or thyromegaly Cardiovascular:  Irregularly irregular no m/r/g. No LE edema. Pedal pulses present and palpable Respiratory:  CTA bilaterally, no w/r/r. Normal respiratory effort. Abdomen:  soft, ntnd, positive bowel sounds throughout Skin:  no rash or induration seen on limited exam Musculoskeletal:  grossly normal tone BUE/BLE, good ROM, no bony abnormality Psychiatric:  grossly normal mood and affect, speech fluent and appropriate, AOx3 Neurologic:  CN 2-12 grossly intact, moves all extremities in coordinated fashion, sensation  intact. Speech clear facial symmetry  Labs on Admission: I have personally reviewed following labs and imaging studies  CBC:  Recent Labs Lab 08/15/16 1225  WBC 8.2  NEUTROABS 5.6  HGB 13.2  HCT 40.8  MCV 92.5  PLT 99991111   Basic Metabolic Panel:  Recent Labs Lab 08/15/16 1225  NA 143  K 3.8  CL 109  CO2 26  GLUCOSE 121*  BUN 17  CREATININE 0.84  CALCIUM 10.4*  MG 2.1   GFR: Estimated Creatinine Clearance: 43.9 mL/min (by C-G formula based on SCr of 0.84 mg/dL). Liver Function Tests: No results for input(s): AST, ALT, ALKPHOS, BILITOT, PROT, ALBUMIN in the last 168 hours. No results for input(s): LIPASE, AMYLASE in the last 168 hours. No results for input(s): AMMONIA in the last 168 hours. Coagulation Profile: No results for input(s): INR, PROTIME in the last 168 hours. Cardiac Enzymes: No results for input(s): CKTOTAL, CKMB, CKMBINDEX, TROPONINI in the last 168 hours. BNP (last 3 results) No results for input(s): PROBNP in the last 8760 hours. HbA1C: No results for input(s): HGBA1C in the last 72 hours. CBG: No results for input(s): GLUCAP in the last 168 hours. Lipid Profile: No results for input(s): CHOL, HDL, LDLCALC, TRIG, CHOLHDL, LDLDIRECT in the last 72 hours. Thyroid Function Tests:  Recent Labs  08/15/16 1225  TSH 4.876*   Anemia Panel: No results for input(s): VITAMINB12, FOLATE, FERRITIN, TIBC, IRON, RETICCTPCT in the  last 72 hours. Urine analysis:    Component Value Date/Time   COLORURINE STRAW (A) 08/15/2016 1221   APPEARANCEUR CLEAR 08/15/2016 1221   LABSPEC 1.003 (L) 08/15/2016 1221   PHURINE 7.0 08/15/2016 1221   GLUCOSEU NEGATIVE 08/15/2016 1221   HGBUR NEGATIVE 08/15/2016 1221   BILIRUBINUR NEGATIVE 08/15/2016 1221   KETONESUR NEGATIVE 08/15/2016 1221   PROTEINUR NEGATIVE 08/15/2016 1221   UROBILINOGEN 1.0 07/11/2015 1020   NITRITE NEGATIVE 08/15/2016 1221   LEUKOCYTESUR NEGATIVE 08/15/2016 1221    Creatinine  Clearance: Estimated Creatinine Clearance: 43.9 mL/min (by C-G formula based on SCr of 0.84 mg/dL).  Sepsis Labs: @LABRCNTIP (procalcitonin:4,lacticidven:4) )No results found for this or any previous visit (from the past 240 hour(s)).   Radiological Exams on Admission: Dg Chest 2 View  Result Date: 08/15/2016 CLINICAL DATA:  82 year old female with shortness of breath and weakness. Initial encounter. EXAM: CHEST  2 VIEW COMPARISON:  07/11/2015 and earlier. FINDINGS: One of the patient arms is not raised on the lateral view today. New small right pleural effusion is evident on the lateral. Stable lung volumes. A degree of chronic hyperinflation is noted. Stable cardiac size and mediastinal contours. Cardiac size at the upper limits of normal. Calcified aortic atherosclerosis. Visualized tracheal air column is within normal limits. No pneumothorax, pulmonary edema, or confluent pulmonary opacity. Osteopenia. No acute osseous abnormality identified. IMPRESSION: 1. New small right pleural effusion. 2. Chronic hyperinflation. Borderline to mild cardiomegaly. Calcified aortic atherosclerosis. Electronically Signed   By: Genevie Ann M.D.   On: 08/15/2016 13:10    EKG: Independently reviewed. Predominant 2:1 AV block Probable left atrial enlargement RBBB and LAFB LVH with secondary repolarization abnormality  Assessment/Plan Principal Problem:   Bradycardia Active Problems:   HLD (hyperlipidemia)   COPD (chronic obstructive pulmonary disease) (HCC)   Anxiety state   HTN (hypertension)   Gastroesophageal reflux disease without esophagitis   DDD (degenerative disc disease), lumbar   Hypothyroidism due to acquired atrophy of thyroid   General weakness   #1. Bradycardia. Etiology unclear.  EKG as noted above. Initial troponin negative. No chest pain. TSH 4.87 -Admit to telemetry -Cycle troponin -Hold diltiazem -Await cardiology recommendations  #2 hypothyroidism due to acquired atrophy of thyroid.  TSH 4.87. Home medications include Synthroid. -Continue Synthroid -Obtain a free T4 and free T3  #3. Hypertension. Poor control in the emergency department. Blood pressure on the high end of normal. Home medications include diltiazem, irbesartan and hydrochlorothiazide -Hold diltiazem for now do to #1 -Continue other home antihypertensives -Hydralazine as needed -Monitor  #4. GERD. Appears stable at baseline. Continue home meds  #5. COPD. Not on home oxygen. Appears stable at baseline. Oxygen saturation level greater than 90% on room air. Chest x-ray with chronic hyperinflation -Continue home inhalers  #6. Anxiety. Appears stable at baseline -Continue home meds  #7. Hyperlipidemia -Continue statin   DVT prophylaxis: lovenox  Code Status: full Family Communication: daughter who lives in [place] on phone Disposition Plan:  [facility] to [facility] Consults called: ganji  Admission status: obs    Radene Gunning MD [facility]  If 7PM-7AM, please contact night-coverage [email or web address] Password [email or web address]  08/15/2016, 3:40 PM

## 2016-08-15 NOTE — ED Provider Notes (Signed)
Emergency Department Provider Note   I have reviewed the triage vital signs and the nursing notes.   HISTORY  Chief Complaint Weakness   HPI Destiny [name] is a 82 y.o. female with PMH of HTN, HLD, and GERD presents to the emergency department for evaluation of generalized fatigue is gradually worsening over the last 4 weeks with sensation of dyspnea worse over the past 2 days. The patient denies any palpitations or chest pain/pressure. No fever or chills. No vomiting or diarrhea. She reports her dyspnea and fatigue are significantly worse when up and ambulating and is to the point where it is affecting her ADLs. She apparently saw Dr. Einar Gip in the office for some irregular heartbeat but was cleared at that visit. No diet or medication changes.   Past Medical History:  Diagnosis Date  . Allergy   . Arthritis   . 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 Cataract   . Chronic anxiety   . Constipation   . Diverticulosis of sigmoid colon   . Emphysema of lung ([place])   . GERD (gastroesophageal reflux disease)   . Glaucoma   . H/O adenomatous polyp of colon    Dr. Cristina Gong  . High cholesterol   . Hypertension   . Hypothyroidism   . Insomnia   . Lactose intolerance   . LBBB (left bundle branch block)   . Lumbar degenerative disc disease   . Onychomycosis   . Osteoarthritis   . Osteoporosis   . Palpitations   . Spinal stenosis   . Thyroid disease   . Varicose veins     Patient Active Problem List   Diagnosis Date Noted  . General weakness 08/15/2016  . Bradycardia 08/15/2016  . AV block, 2nd degree   . Gastroesophageal reflux disease without esophagitis 02/03/2016  . Hiatal hernia 02/03/2016  . Allergic sinusitis 02/03/2016  . Senile osteoporosis 02/03/2016  . DDD (degenerative disc disease), lumbar 02/03/2016  . Hypothyroidism due to acquired atrophy of thyroid 02/03/2016  . Frequent loose stools 02/03/2016  .  Basal cell carcinoma of lower back 02/03/2016  . Lactose intolerance in adult 02/03/2016  . Glaucoma 02/03/2016  . Herniated nucleus pulposus, L3-4 right 09/21/2015  . Nausea and vomiting 07/17/2015  . Piriformis syndrome 07/17/2015  . Constipation 07/17/2015  . HLD (hyperlipidemia) 07/17/2015  . COPD (chronic obstructive pulmonary disease) ([place]) 07/17/2015  . Anxiety state 07/17/2015  . HTN (hypertension) 07/17/2015    Past Surgical History:  Procedure Laterality Date  . ABDOMINAL HYSTERECTOMY    . BLADDER REPAIR    . blephoplasty bilateral  03/10/2014  . CATARACT EXTRACTION W/ INTRAOCULAR LENS  IMPLANT, BILATERAL  7 & 8 2014  . CHOLECYSTECTOMY    . COLONOSCOPY  05/18/2012  . EYE SURGERY    . herniated disc repair    . INCONTINENCE SURGERY    . JOINT REPLACEMENT    . LAMINECTOMY  02/2011   L3-L5  Dr. Ellene Route  . LUMBAR LAMINECTOMY/DECOMPRESSION MICRODISCECTOMY Right 09/21/2015   Procedure: Right Lumbar Three-FourMicrodiskectomy;  Surgeon: Kristeen Miss, MD;  Location: [facility];  Service: Neurosurgery;  Laterality: Right;  Right L3-4 Microdiskectomy  . SHOULDER ARTHROSCOPY WITH ROTATOR CUFF REPAIR Right 08/2003  . TONSILLECTOMY    . TOTAL HIP ARTHROPLASTY     right 1999      Allergies Codeine; Macrobid [nitrofurantoin]; Miralax [polyethylene glycol]; Pain relief [acetaminophen]; Paxil [paroxetine hcl]; Percodan [oxycodone-aspirin]; Oxycodone; Oxycodone-acetaminophen; and Penicillins  Family History  Problem Relation Age of Onset  . Arthritis Mother   . Osteoporosis Mother   . Heart attack Father     Social History Social History  Substance Use Topics  . Smoking status: Never Smoker  . Smokeless tobacco: Never Used  . Alcohol use No    Review of Systems  Constitutional: No fever/chills. Positive worsening fatigue.  Eyes: No visual changes. ENT: No sore throat. Cardiovascular: Denies chest pain. Respiratory: Positive shortness of breath. Gastrointestinal: No  abdominal pain.  No nausea, no vomiting.  No diarrhea.  No constipation. Genitourinary: Negative for dysuria. Musculoskeletal: Negative for back pain. Skin: Negative for rash. Neurological: Negative for headaches, focal weakness or numbness.  10-point ROS otherwise negative.  ____________________________________________   PHYSICAL EXAM:  VITAL SIGNS: ED Triage Vitals  Enc Vitals Group     BP 08/15/16 1150 188/89     Pulse Rate 08/15/16 1150 (!) 44     Resp 08/15/16 1151 (!) 6     SpO2 08/15/16 1142 94 %     Weight 08/15/16 1144 140 lb (63.5 kg)     Height 08/15/16 1144 5\' 3"  (1.6 m)   Constitutional: Alert and oriented. Well appearing and in no acute distress. Eyes: Conjunctivae are normal.  Head: Atraumatic. Nose: No congestion/rhinnorhea. Mouth/Throat: Mucous membranes are moist.  Oropharynx non-erythematous. Neck: No stridor.   Cardiovascular: Bradycardia. Good peripheral circulation. Grossly normal heart sounds.   Respiratory: Normal respiratory effort.  No retractions. Lungs CTAB. Gastrointestinal: Soft and nontender. No distention.  Musculoskeletal: No lower extremity tenderness nor edema. No gross deformities of extremities. Neurologic:  Normal speech and language. No gross focal neurologic deficits are appreciated.  Skin:  Skin is warm, dry and intact. No rash noted.  ____________________________________________   LABS (all labs ordered are listed, but only abnormal results are displayed)  Labs Reviewed  BASIC METABOLIC PANEL - Abnormal; Notable for the following:       Result Value   Glucose, Bld 121 (*)    Calcium 10.4 (*)    All other components within normal limits  TSH - Abnormal; Notable for the following:    TSH 4.876 (*)    All other components within normal limits  URINALYSIS, ROUTINE W REFLEX MICROSCOPIC - Abnormal; Notable for the following:    Color, Urine STRAW (*)    Specific Gravity, Urine 1.003 (*)    All other components within normal  limits  T4, FREE - Abnormal; Notable for the following:    Free T4 1.31 (*)    All other components within normal limits  MAGNESIUM  CBC WITH DIFFERENTIAL/PLATELET  T3, FREE  BASIC METABOLIC PANEL  CBC  I-STAT TROPOININ, ED   ____________________________________________  EKG   EKG Interpretation  Date/Time:  Monday August 15 2016 11:51:08 EST Ventricular Rate:  45 PR Interval:    QRS Duration: 151 QT Interval:  491 QTC Calculation: 425 R Axis:   -64 Text Interpretation:  Predominant 2:1 AV block Probable left atrial enlargement RBBB and LAFB LVH with secondary repolarization abnormality No STEMI.  Confirmed by LONG MD, JOSHUA [phone]) on 08/15/2016 11:54:37 AM       ____________________________________________  RADIOLOGY  Dg Chest 2 View  Result Date: 08/15/2016 CLINICAL DATA:  82 year old female with shortness of breath and weakness. Initial encounter. EXAM: CHEST  2 VIEW COMPARISON:  07/11/2015 and earlier. FINDINGS: One of the patient arms is not raised on the lateral view today. New small right pleural effusion is evident on the lateral.  Stable lung volumes. A degree of chronic hyperinflation is noted. Stable cardiac size and mediastinal contours. Cardiac size at the upper limits of normal. Calcified aortic atherosclerosis. Visualized tracheal air column is within normal limits. No pneumothorax, pulmonary edema, or confluent pulmonary opacity. Osteopenia. No acute osseous abnormality identified. IMPRESSION: 1. New small right pleural effusion. 2. Chronic hyperinflation. Borderline to mild cardiomegaly. Calcified aortic atherosclerosis. Electronically Signed   By: Genevie Ann M.D.   On: 08/15/2016 13:10    ____________________________________________   PROCEDURES  Procedure(s) performed:   Procedures  None ____________________________________________   INITIAL IMPRESSION / ASSESSMENT AND PLAN / ED COURSE  Pertinent labs & imaging results that were available  during my care of the patient were reviewed by me and considered in my medical decision making (see chart for details).  Patient resents to the emergency department for evaluation of generalized fatigue and dyspnea is worse with exertion. Patient has bradycardia on arrival to the ED with normal BP. Does have a history of thyroid disease but has been compliant with medication. No clear clinical evidence of infection to explain symptoms. No neurological deficits to suggest CVA. Patient may be experiencing symptoms related to symptomatic bradycardia. Will follow electrolytes, TSH, and CXR with dyspnea complaint.   01:43 PM Spoke with Dr. Einar Gip regarding the case. He recommends holding Diltiazem and monitoring on tele to see response with possible need for pacemaker if bradycardia does not resolve. Patient updated. Her PCP is Dr. Hollace Kinnier. Will discuss admission with the hospitalist.   Discussed patient's case with hospitalist, Dr. Marily Memos.  Recommend admission to tele, obs bed.  I will place holding orders per their request. Patient and family (if present) updated with plan. Care transferred to hosptitalist service.  I reviewed all nursing notes, vitals, pertinent old records, EKGs, labs, imaging (as available).  ____________________________________________  FINAL CLINICAL IMPRESSION(S) / ED DIAGNOSES  Final diagnoses:  Bradycardia     MEDICATIONS GIVEN DURING THIS VISIT:  Medications  levothyroxine (SYNTHROID, LEVOTHROID) tablet 125 mcg (not administered)  ipratropium (ATROVENT) 0.06 % nasal spray 2 spray (not administered)  multivitamin with minerals tablet 1 tablet (not administered)  dorzolamide-timolol (COSOPT) 22.3-6.8 MG/ML ophthalmic solution 1 drop (0 drops Both Eyes Duplicate [id number])  pantoprazole (PROTONIX) EC tablet 40 mg (40 mg Oral Given 08/15/16 1703)  rosuvastatin (CRESTOR) tablet 5 mg (not administered)  sodium chloride flush (NS) 0.9 % injection 3 mL (not  administered)  0.9 %  sodium chloride infusion ( Intravenous New Bag/Given 08/15/16 2012)  ondansetron (ZOFRAN) tablet 4 mg (not administered)    Or  ondansetron (ZOFRAN) injection 4 mg (not administered)  sodium phosphate (FLEET) 7-19 GM/118ML enema 1 enema (not administered)  hydrALAZINE (APRESOLINE) injection 5 mg (not administered)  enoxaparin (LOVENOX) injection 40 mg (40 mg Subcutaneous Given 08/15/16 1706)  irbesartan (AVAPRO) tablet 300 mg (not administered)    And  hydrochlorothiazide (MICROZIDE) capsule 12.5 mg (not administered)  calcium-vitamin D (OSCAL WITH D) 500-200 MG-UNIT per tablet 1 tablet (not administered)     NEW OUTPATIENT MEDICATIONS STARTED DURING THIS VISIT:  None   Note:  This document was prepared using Dragon voice recognition software and may include unintentional dictation errors.  Nanda Quinton, MD Emergency Medicine   Margette Fast, MD 08/15/16 [phone]

## 2016-08-15 NOTE — Consult Note (Signed)
CARDIOLOGY CONSULT NOTE  Patient ID: Destiny [name] MRN: [id number] DOB/AGE: 1927-12-30 82 y.o.  Admit date: 08/15/2016 Referring Physician  [place] Primary Physician:  Hollace Kinnier, DO Reason for Consultation  Fatigue, abnormal EKG  HPI: Destiny [name]  is a 82 y.o. female who is very active With history of hypertension and hyperlipidemia, I had last seen her on 06/08/2016 for evaluation of dyspnea on exertion and palpitations.  At that time she had an echocardiogram on 09/16/2015 revealing normal LVEF and grade 1 diastolic dysfunction with mild mitral regurgitation and mild to moderate tricuspid regurgitation.  Nuclear stress test on 09/14/2015 had revealed no evidence of ischemia with normal LVEF.  She is now admitted to the hospital with marked fatigue, dyspnea ongoing for 2 weeks, bradycardia and abnormal EKG revealing underlying sinus rhythm with 2:1 AV block. No syncope, no dizziness, no chest pain. No further palpitations since being on multivitamins. Her daughter is present the bedside.  Past Medical History:  Diagnosis Date  . Allergy   . Arthritis   . 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 Cataract   . Chronic anxiety   . Constipation   . Diverticulosis of sigmoid colon   . Emphysema of lung ([place])   . GERD (gastroesophageal reflux disease)   . Glaucoma   . H/O adenomatous polyp of colon    Dr. Cristina Gong  . High cholesterol   . Hypertension   . Hypothyroidism   . Insomnia   . Lactose intolerance   . LBBB (left bundle branch block)   . Lumbar degenerative disc disease   . Onychomycosis   . Osteoarthritis   . Osteoporosis   . Palpitations   . Spinal stenosis   . Thyroid disease   . Varicose veins      Past Surgical History:  Procedure Laterality Date  . ABDOMINAL HYSTERECTOMY    . BLADDER REPAIR    . blephoplasty bilateral  03/10/2014  . CATARACT EXTRACTION W/ INTRAOCULAR LENS  IMPLANT, BILATERAL  7 & 8 2014  .  CHOLECYSTECTOMY    . COLONOSCOPY  05/18/2012  . EYE SURGERY    . herniated disc repair    . INCONTINENCE SURGERY    . JOINT REPLACEMENT    . LAMINECTOMY  02/2011   L3-L5  Dr. Ellene Route  . LUMBAR LAMINECTOMY/DECOMPRESSION MICRODISCECTOMY Right 09/21/2015   Procedure: Right Lumbar Three-FourMicrodiskectomy;  Surgeon: Kristeen Miss, MD;  Location: [facility];  Service: Neurosurgery;  Laterality: Right;  Right L3-4 Microdiskectomy  . SHOULDER ARTHROSCOPY WITH ROTATOR CUFF REPAIR Right 08/2003  . TONSILLECTOMY    . TOTAL HIP ARTHROPLASTY     right 1999     Family History  Problem Relation Age of Onset  . Arthritis Mother   . Osteoporosis Mother   . Heart attack Father      Social History: Social History   Social History  . Marital status: Widowed    Spouse name: N/A  . Number of children: N/A  . Years of education: N/A   Occupational History  . Not on file.   Social History Main Topics  . Smoking status: Never Smoker  . Smokeless tobacco: Never Used  . Alcohol use No  . Drug use: No  . Sexual activity: No   Other Topics Concern  . Not on file   Social History Narrative   Lives at [facility]   Caffeine 1-2 cups daily   Exercise  water aerobics, walking   Never smoked   POA         Diet:       Do you drink/ eat things with caffeine? Yes      Marital status:    Widow                           What year were you married ? 1957      Do you live in a house, apartment,assistred living, condo, trailer, etc.)?[facility]      Is it one or more stories?       How many persons live in your home ? 1      Do you have any pets in your home ?(please list)0       Current or past profession: Social Worker      Do you exercise?  Yes                            Type & how often:  Water Aerobic, 3-4 hours per week       Do you have a living will? Yes      Do you have a DNR form? Yes                       If not, do you want to discuss one?        Do you have signed POA?HPOA forms?                 If so, please bring to your        appointment              Prescriptions Prior to Admission  Medication Sig Dispense Refill Last Dose  . Calcium-Phosphorus-Vitamin D (CITRACAL +D3) 250-107-500 MG-MG-UNIT CHEW Chew 1 tablet by mouth daily.   08/15/2016 at Unknown time  . denosumab (PROLIA) 60 MG/ML SOLN injection Inject 60 mg into the skin every 6 (six) months. Administer in upper arm, thigh, or abdomen   >80yr at Unknown time  . diltiazem (CARDIZEM CD) 240 MG 24 hr capsule Take 240 mg by mouth daily.    08/15/2016 at Unknown time  . dorzolamide-timolol (COSOPT) 22.3-6.8 MG/ML ophthalmic solution Place 1 drop into both eyes 2 (two) times daily.  5 08/15/2016 at Unknown time  . ipratropium (ATROVENT) 0.06 % nasal spray Place 2 sprays into both nostrils 3 (three) times daily.   Past Week at Unknown time  . irbesartan-hydrochlorothiazide (AVALIDE) 300-12.5 MG tablet Take 1 tablet by mouth daily. 30 tablet 6 08/15/2016 at Unknown time  . LACTASE PO Take 1 tablet by mouth as needed (when eating dairy products).   Past Week at Unknown time  . Multiple Vitamins-Minerals (MULTIVITAMIN WITH MINERALS) tablet Take 1 tablet by mouth daily.   08/15/2016 at Unknown time  . naproxen sodium (ANAPROX) 220 MG tablet Take 220 mg by mouth 2 (two) times daily with a meal.   08/15/2016 at Unknown time  . rosuvastatin (CRESTOR) 10 MG tablet Take 5 mg by mouth at bedtime.    08/14/2016 at Unknown time  . SYNTHROID 125 MCG tablet TAKE 1 TABLET BY MOUTH EVERY DAY 30 tablet 3 08/14/2016 at Unknown time  . bifidobacterium infantis (ALIGN) capsule Take 1 capsule by mouth daily.   Not Taking at Unknown time  . cetirizine (ZYRTEC) 10 MG tablet Take 10  mg by mouth. Take one tablet daily as needed   Not Taking at Unknown time  . clonazePAM (KLONOPIN) 0.5 MG tablet Take 0.25 mg by mouth 2 (two) times daily as needed for anxiety.    Not Taking at Unknown time  . fexofenadine  (ALLEGRA) 180 MG tablet Take 180 mg by mouth daily.   Not Taking at Unknown time  . pantoprazole (PROTONIX) 40 MG tablet Take 40 mg by mouth daily.   Not Taking at Unknown time     ROS: General: FATIGUE present. No fevers/chills/night sweats Eyes: no blurry vision, diplopia, or amaurosis ENT: no sore throat or hearing loss Resp: no cough, wheezing, or hemoptysis CV: Mild leg  Edema, no palpitations GI: no abdominal pain, nausea, vomiting, diarrhea, or constipation GU: no dysuria, frequency, or hematuria Skin: no rash Neuro: no headache, numbness, tingling, or weakness of extremities Musculoskeletal: no joint pain or swelling Heme: no bleeding, DVT, or easy bruising Endo: no polydipsia or polyuria    Physical Exam: Blood pressure (!) 156/60, pulse (!) 37, temperature 97.7 F (36.5 C), temperature source Oral, resp. rate 18, height 5\' 3"  (1.6 m), weight 63.5 kg (140 lb), SpO2 96 %.   General appearance: alert, cooperative, appears stated age and no distress Lungs: clear to auscultation bilaterally Heart: regular rate and rhythm, S1, S2 normal, no murmur, click, rub or gallop and Marked bradycardia present Abdomen: soft, non-tender; bowel sounds normal; no masses,  no organomegaly and Mildly obese Extremities: edema 1-2+ below-knee, full range of movements. No tenderness. Pulses: 2+ and symmetric Faint right carotid bruit present Neurologic: Grossly normal  Labs:   Lab Results  Component Value Date   WBC 8.2 08/15/2016   HGB 13.2 08/15/2016   HCT 40.8 08/15/2016   MCV 92.5 08/15/2016   PLT 218 08/15/2016    Recent Labs Lab 08/15/16 1225  NA 143  K 3.8  CL 109  CO2 26  BUN 17  CREATININE 0.84  CALCIUM 10.4*  GLUCOSE 121*    Lipid Panel     Component Value Date/Time   CHOL 175 09/02/2015   TRIG 126 09/02/2015   HDL 71 (A) 09/02/2015   LDLCALC 79 09/02/2015    Recent Labs  09/02/15 03/07/16 1146 08/15/16 1225  TSH 0.70 0.48 4.876*     Radiology: Dg  Chest 2 View  Result Date: 08/15/2016 CLINICAL DATA:  82 year old female with shortness of breath and weakness. Initial encounter. EXAM: CHEST  2 VIEW COMPARISON:  07/11/2015 and earlier. FINDINGS: One of the patient arms is not raised on the lateral view today. New small right pleural effusion is evident on the lateral. Stable lung volumes. A degree of chronic hyperinflation is noted. Stable cardiac size and mediastinal contours. Cardiac size at the upper limits of normal. Calcified aortic atherosclerosis. Visualized tracheal air column is within normal limits. No pneumothorax, pulmonary edema, or confluent pulmonary opacity. Osteopenia. No acute osseous abnormality identified. IMPRESSION: 1. New small right pleural effusion. 2. Chronic hyperinflation. Borderline to mild cardiomegaly. Calcified aortic atherosclerosis. Electronically Signed   By: Genevie Ann M.D.   On: 08/15/2016 13:10    Scheduled Meds: . [START ON 08/16/2016] calcium-vitamin D  1 tablet Oral Q breakfast  . dorzolamide-timolol  1 drop Both Eyes BID  . enoxaparin (LOVENOX) injection  40 mg Subcutaneous Q24H  . [START ON 08/16/2016] irbesartan  300 mg Oral Daily   And  . [START ON 08/16/2016] hydrochlorothiazide  12.5 mg Oral Daily  . ipratropium  2 spray Each  Nare TID  . [START ON 08/16/2016] levothyroxine  125 mcg Oral QAC breakfast  . [START ON 08/16/2016] multivitamin with minerals  1 tablet Oral Daily  . pantoprazole  40 mg Oral Daily  . rosuvastatin  5 mg Oral QHS  . sodium chloride flush  3 mL Intravenous Q12H   Continuous Infusions: . sodium chloride     PRN Meds:.hydrALAZINE, ondansetron **OR** ondansetron (ZOFRAN) IV, sodium phosphate   EKG 08/15/2016: Sinus rhythm with 2:1 AV block, ventricular rate 45 bpm, left axis deviation, left anterior fascicular block.  Right bundle branch block.  Normal QT interval.  No evidence of ischemia.  Office  Echo- 09/16/2015 1. Left ventricle cavity is normal in size. Normal global  wall motion. Visual EF is approx. 55%. Doppler evidence of grade I (impaired) diastolic dysfunction with elevated LV filling pressure. 2. Left atrial cavity is borderline dilated. 3. Mild mitral regurgitation. Moderate calcification of the mitral valve annulus. Mild mitral valve leaflet calcification. Slightly restricted mitral valve leaflets. 4. Mild to moderate tricuspid regurgitation. No evidence of pulmonary hypertension.  Office Lexiscan myoview stress test 09/14/2015: 1. The resting electrocardiogram demonstrated normal sinus rhythm, normal resting conduction, no resting arrhythmias and non specific ST change inferior and lateral leads.. Stress EKG is indeterminate for myocardial ischemia with 1 mm inferior and lateral ST depression and non specific T inversion. Stress symptoms included dyspnea. 2. Myocardial perfusion imaging is normal. Overall left ventricular systolic function was normal without regional wall motion abnormalities. The left ventricular ejection fraction was 71%. This represents a low risk study. Clinical correlation recommended given EKG abnormalities.  ASSESSMENT AND PLAN:  1. Symptomatic high degree AV block, patient presented with 2:1 AV block, now on telemetry has third degree AV block with junctional escape, hemodynamically stable. Diltiazem on hold, last dose earlier this morning. 2. Hypertension 3. Mild hyperlipidemia 4. Right carotid bruit  Recommendation: Patient will probably need permanent pacemaker implantation. Diltiazem is on hold, will watch her rhythm in the morning. She has got faint right carotid bruit, most probably related to high stroke volume from marked bradycardia, if bruit persists, needs carotid duplex. I will also hold timolol eyedrops. I will keep her nothing by mouth, make recommendations in the morning. Blood pressure is elevated, however I will hold off on starting any new medications for now, elevated systolic blood pressure may also be related  to high stroke volume due to marked bradycardia.  Adrian Prows, MD 08/15/2016, 7:41 PM [facility] [place] Pager: [phone] Office: [phone] If no answer Cell [phone]

## 2016-08-15 NOTE — ED Notes (Signed)
Called pt daughter and did not get an answer. Notified pt.

## 2016-08-16 ENCOUNTER — Encounter (HOSPITAL_COMMUNITY)
Admission: EM | Disposition: A | Discharge status: Home / Self Care | Payer: Self-pay | Source: Home / Self Care | Attending: Internal Medicine

## 2016-08-16 ENCOUNTER — Inpatient Hospital Stay (HOSPITAL_COMMUNITY): Payer: Medicare Other

## 2016-08-16 DIAGNOSIS — R0600 Dyspnea, unspecified: Secondary | ICD-10-CM | POA: Diagnosis not present

## 2016-08-16 DIAGNOSIS — I10 Essential (primary) hypertension: Secondary | ICD-10-CM | POA: Diagnosis present

## 2016-08-16 DIAGNOSIS — E876 Hypokalemia: Secondary | ICD-10-CM | POA: Diagnosis present

## 2016-08-16 DIAGNOSIS — Z881 Allergy status to other antibiotic agents status: Secondary | ICD-10-CM | POA: Diagnosis not present

## 2016-08-16 DIAGNOSIS — E034 Atrophy of thyroid (acquired): Secondary | ICD-10-CM | POA: Diagnosis present

## 2016-08-16 DIAGNOSIS — Z885 Allergy status to narcotic agent status: Secondary | ICD-10-CM | POA: Diagnosis not present

## 2016-08-16 DIAGNOSIS — Z950 Presence of cardiac pacemaker: Secondary | ICD-10-CM | POA: Diagnosis not present

## 2016-08-16 DIAGNOSIS — Z8249 Family history of ischemic heart disease and other diseases of the circulatory system: Secondary | ICD-10-CM | POA: Diagnosis not present

## 2016-08-16 DIAGNOSIS — J410 Simple chronic bronchitis: Secondary | ICD-10-CM

## 2016-08-16 DIAGNOSIS — K219 Gastro-esophageal reflux disease without esophagitis: Secondary | ICD-10-CM | POA: Diagnosis not present

## 2016-08-16 DIAGNOSIS — J449 Chronic obstructive pulmonary disease, unspecified: Secondary | ICD-10-CM | POA: Diagnosis present

## 2016-08-16 DIAGNOSIS — Z888 Allergy status to other drugs, medicaments and biological substances status: Secondary | ICD-10-CM | POA: Diagnosis not present

## 2016-08-16 DIAGNOSIS — Z886 Allergy status to analgesic agent status: Secondary | ICD-10-CM | POA: Diagnosis not present

## 2016-08-16 DIAGNOSIS — J411 Mucopurulent chronic bronchitis: Secondary | ICD-10-CM | POA: Diagnosis not present

## 2016-08-16 DIAGNOSIS — F411 Generalized anxiety disorder: Secondary | ICD-10-CM | POA: Diagnosis present

## 2016-08-16 DIAGNOSIS — I081 Rheumatic disorders of both mitral and tricuspid valves: Secondary | ICD-10-CM | POA: Diagnosis present

## 2016-08-16 DIAGNOSIS — J90 Pleural effusion, not elsewhere classified: Secondary | ICD-10-CM | POA: Diagnosis not present

## 2016-08-16 DIAGNOSIS — Z880 Allergy status to penicillin: Secondary | ICD-10-CM | POA: Diagnosis not present

## 2016-08-16 DIAGNOSIS — I442 Atrioventricular block, complete: Secondary | ICD-10-CM | POA: Diagnosis present

## 2016-08-16 DIAGNOSIS — R0989 Other specified symptoms and signs involving the circulatory and respiratory systems: Secondary | ICD-10-CM | POA: Diagnosis not present

## 2016-08-16 DIAGNOSIS — I441 Atrioventricular block, second degree: Secondary | ICD-10-CM | POA: Diagnosis present

## 2016-08-16 DIAGNOSIS — J939 Pneumothorax, unspecified: Secondary | ICD-10-CM | POA: Diagnosis not present

## 2016-08-16 DIAGNOSIS — R001 Bradycardia, unspecified: Secondary | ICD-10-CM

## 2016-08-16 DIAGNOSIS — E785 Hyperlipidemia, unspecified: Secondary | ICD-10-CM | POA: Diagnosis present

## 2016-08-16 HISTORY — PX: EP IMPLANTABLE DEVICE: SHX172B

## 2016-08-16 LAB — ECHOCARDIOGRAM COMPLETE
AV Area VTI index: 0.84 cm2/m2
AV Area mean vel: 1.2 cm2
AV Mean grad: 7 mmHg
AV Peak grad: 14 mmHg
AV area mean vel ind: 0.71 cm2/m2
AV peak Index: 0.75
AV vel: 1.41
AVA: 1.41 cm2
AVAREAVTI: 1.26 cm2
AVCELMEANRAT: 0.47
AVPKVEL: 189 cm/s
Ao pk vel: 0.5 m/s
CHL CUP DOP CALC LVOT VTI: 22.5 cm
DOP CAL AO MEAN VELOCITY: 122 cm/s
EERAT: 19.44
EWDT: 127 ms
FS: 29 % (ref 28–44)
Height: 63 in
IVS/LV PW RATIO, ED: 1.04
LA ID, A-P, ES: 33 mm
LA diam end sys: 33 mm
LA diam index: 1.96 cm/m2
LA vol A4C: 41.3 ml
LDCA: 2.54 cm2
LV E/e' medial: 19.44
LV TDI E'LATERAL: 5.35
LV TDI E'MEDIAL: 5.87
LV e' LATERAL: 5.35 cm/s
LVEEAVG: 19.44
LVOT SV: 57 mL
LVOT diameter: 18 mm
LVOT peak VTI: 0.55 cm
LVOTPV: 93.8 cm/s
Lateral S' vel: 18 cm/s
MV Dec: 127
MV pk A vel: 161 m/s
MVPG: 4 mmHg
MVPKEVEL: 104 m/s
PW: 10.4 mm — AB (ref 0.6–1.1)
RV TAPSE: 28.7 mm
Reg peak vel: 251 cm/s
TRMAXVEL: 251 cm/s
VTI: 40.6 cm
Valve area index: 0.84
WEIGHTICAEL: 2220.8 [oz_av]

## 2016-08-16 LAB — BASIC METABOLIC PANEL
ANION GAP: 10 (ref 5–15)
BUN: 13 mg/dL (ref 6–20)
CALCIUM: 9.8 mg/dL (ref 8.9–10.3)
CO2: 23 mmol/L (ref 22–32)
Chloride: 108 mmol/L (ref 101–111)
Creatinine, Ser: 0.77 mg/dL (ref 0.44–1.00)
GFR calc Af Amer: >60 mL/min (ref >60)
GFR calc non Af Amer: >60 mL/min (ref >60)
GLUCOSE: 99 mg/dL (ref 65–99)
Potassium: 3.2 mmol/L — ABNORMAL LOW (ref 3.5–5.1)
Sodium: 141 mmol/L (ref 135–145)

## 2016-08-16 LAB — T3, FREE: T3, Free: 2.1 pg/mL (ref 2.0–4.4)

## 2016-08-16 LAB — CBC
HEMATOCRIT: 36.2 % (ref 36.0–46.0)
HEMOGLOBIN: 11.8 g/dL — AB (ref 12.0–15.0)
MCH: 30 pg (ref 26.0–34.0)
MCHC: 32.6 g/dL (ref 30.0–36.0)
MCV: 92.1 fL (ref 78.0–100.0)
Platelets: 215 10*3/uL (ref 150–400)
RBC: 3.93 MIL/uL (ref 3.87–5.11)
RDW: 14.2 % (ref 11.5–15.5)
WBC: 7.6 10*3/uL (ref 4.0–10.5)

## 2016-08-16 SURGERY — PACEMAKER IMPLANT

## 2016-08-16 MED ORDER — MIDAZOLAM HCL 5 MG/5ML IJ SOLN
INTRAMUSCULAR | Status: DC | PRN
  Administered 2016-08-16 (×2): 1 mg via INTRAVENOUS

## 2016-08-16 MED ORDER — VANCOMYCIN HCL IN DEXTROSE 1-5 GM/200ML-% IV SOLN
INTRAVENOUS | Status: AC
  Filled 2016-08-16: qty 200

## 2016-08-16 MED ORDER — VANCOMYCIN HCL IN DEXTROSE 1-5 GM/200ML-% IV SOLN
1000.0000 mg | Freq: Two times a day (BID) | INTRAVENOUS | Status: AC
  Administered 2016-08-16: 1000 mg via INTRAVENOUS
  Filled 2016-08-16: qty 200

## 2016-08-16 MED ORDER — DILTIAZEM HCL ER COATED BEADS 240 MG PO CP24
240.0000 mg | ORAL_CAPSULE | Freq: Every day | ORAL | Status: DC
  Administered 2016-08-17 – 2016-08-18 (×2): 240 mg via ORAL
  Filled 2016-08-16 (×2): qty 1

## 2016-08-16 MED ORDER — SODIUM CHLORIDE 0.9 % IV SOLN
INTRAVENOUS | Status: DC
  Administered 2016-08-16: 13:00:00 via INTRAVENOUS

## 2016-08-16 MED ORDER — SODIUM CHLORIDE 0.9% FLUSH
3.0000 mL | Freq: Two times a day (BID) | INTRAVENOUS | Status: DC
  Administered 2016-08-16 – 2016-08-17 (×4): 3 mL via INTRAVENOUS

## 2016-08-16 MED ORDER — CHLORHEXIDINE GLUCONATE 4 % EX LIQD: 60.0000 mL | Freq: Once | CUTANEOUS | Status: DC

## 2016-08-16 MED ORDER — SODIUM CHLORIDE 0.9% FLUSH: 3.0000 mL | INTRAVENOUS | Status: DC | PRN

## 2016-08-16 MED ORDER — ONDANSETRON HCL 4 MG/2ML IJ SOLN
4.0000 mg | Freq: Four times a day (QID) | INTRAMUSCULAR | Status: DC | PRN
Start: 2016-08-16 — End: 2016-08-18
  Administered 2016-08-16: 4 mg via INTRAVENOUS
  Filled 2016-08-16: qty 2

## 2016-08-16 MED ORDER — POTASSIUM CHLORIDE ER 10 MEQ PO TBCR
20.0000 meq | EXTENDED_RELEASE_TABLET | Freq: Once | ORAL | Status: AC
  Administered 2016-08-16: 20 meq via ORAL
  Filled 2016-08-16 (×2): qty 2

## 2016-08-16 MED ORDER — SODIUM CHLORIDE 0.9 % IR SOLN
80.0000 mg | Status: AC
  Administered 2016-08-16: 80 mg

## 2016-08-16 MED ORDER — SODIUM CHLORIDE 0.9 % IV SOLN: 250.0000 mL | INTRAVENOUS | Status: DC | PRN

## 2016-08-16 MED ORDER — YOU HAVE A PACEMAKER BOOK
Freq: Once | Status: AC
  Administered 2016-08-16
  Filled 2016-08-16: qty 1

## 2016-08-16 MED ORDER — POTASSIUM CHLORIDE CRYS ER 20 MEQ PO TBCR
40.0000 meq | EXTENDED_RELEASE_TABLET | Freq: Once | ORAL | Status: AC
Start: 2016-08-16 — End: 2016-08-16
  Administered 2016-08-16: 40 meq via ORAL
  Filled 2016-08-16: qty 2

## 2016-08-16 MED ORDER — IOPAMIDOL (ISOVUE-370) INJECTION 76%
INTRAVENOUS | Status: DC | PRN
  Administered 2016-08-16: 15 mL via INTRAVENOUS

## 2016-08-16 MED ORDER — SODIUM CHLORIDE 0.9 % IV SOLN: INTRAVENOUS | Status: DC

## 2016-08-16 MED ORDER — NAPROXEN 250 MG PO TABS
375.0000 mg | ORAL_TABLET | Freq: Once | ORAL | Status: AC
  Administered 2016-08-16: 375 mg via ORAL
  Filled 2016-08-16: qty 2

## 2016-08-16 MED ORDER — MIDAZOLAM HCL 5 MG/5ML IJ SOLN
INTRAMUSCULAR | Status: AC
  Filled 2016-08-16: qty 5

## 2016-08-16 MED ORDER — ACETAMINOPHEN 325 MG PO TABS
325.0000 mg | ORAL_TABLET | ORAL | Status: DC | PRN
  Administered 2016-08-16 – 2016-08-18 (×4): 650 mg via ORAL
  Filled 2016-08-16 (×4): qty 2

## 2016-08-16 MED ORDER — FENTANYL CITRATE (PF) 100 MCG/2ML IJ SOLN
INTRAMUSCULAR | Status: AC
  Filled 2016-08-16: qty 2

## 2016-08-16 MED ORDER — LIDOCAINE HCL (PF) 1 % IJ SOLN
INTRAMUSCULAR | Status: AC
  Filled 2016-08-16: qty 60

## 2016-08-16 MED ORDER — VANCOMYCIN HCL IN DEXTROSE 1-5 GM/200ML-% IV SOLN
1000.0000 mg | INTRAVENOUS | Status: AC
  Administered 2016-08-16: 1000 mg via INTRAVENOUS

## 2016-08-16 MED ORDER — LIDOCAINE HCL (PF) 1 % IJ SOLN
INTRAMUSCULAR | Status: DC | PRN
  Administered 2016-08-16: 40 mL

## 2016-08-16 MED ORDER — FENTANYL CITRATE (PF) 100 MCG/2ML IJ SOLN
INTRAMUSCULAR | Status: DC | PRN
  Administered 2016-08-16 (×3): 12.5 ug via INTRAVENOUS

## 2016-08-16 MED ORDER — HEPARIN (PORCINE) IN NACL 2-0.9 UNIT/ML-% IJ SOLN
INTRAMUSCULAR | Status: DC | PRN
  Administered 2016-08-16: 500 mL

## 2016-08-16 MED ORDER — GENTAMICIN SULFATE 40 MG/ML IJ SOLN
INTRAMUSCULAR | Status: AC
  Filled 2016-08-16: qty 2

## 2016-08-16 MED ORDER — HEPARIN (PORCINE) IN NACL 2-0.9 UNIT/ML-% IJ SOLN
INTRAMUSCULAR | Status: AC
  Filled 2016-08-16: qty 500

## 2016-08-16 SURGICAL SUPPLY — 8 items
CABLE SURGICAL S-101-97-12 (CABLE) ×1 IMPLANT
LEAD CAPSURE NOVUS 45CM (Lead) ×1 IMPLANT
LEAD CAPSURE NOVUS 5076-58CM (Lead) ×1 IMPLANT
PAD DEFIB LIFELINK (PAD) ×1 IMPLANT
PPM ADVISA MRI DR A2DR01 (Pacemaker) ×1 IMPLANT
SET INTRODUCER MICROPUNCT 5F (INTRODUCER) ×1 IMPLANT
SHEATH CLASSIC 7F (SHEATH) ×2 IMPLANT
TRAY PACEMAKER INSERTION (PACKS) ×1 IMPLANT

## 2016-08-16 NOTE — Progress Notes (Signed)
[facility] PROGRESS NOTE  GWENNETTE CAMARATA [id number] DOB: 1928-08-23 DOA: 08/15/2016   PCP: Hollace Kinnier, DO     Assessment/Plan: Principal Problem:   Bradycardia Active Problems:   HLD (hyperlipidemia)   COPD (chronic obstructive pulmonary disease) (HCC)   Anxiety state   HTN (hypertension)   Gastroesophageal reflux disease without esophagitis   DDD (degenerative disc disease), lumbar   Hypothyroidism due to acquired atrophy of thyroid   General weakness   AV block, 2nd degree   82 y.o. female with PMHx of HTN, HLD, COPD, anxiety, hypothyroidism, and GERD, admitted to [facility] yesterday with heart block.  She mentions for about 2 weeks feeling like she had no energy, and getting winded with exertion, no CP, no palpitations. Electrophysiology consulted  Assessment and plan #1. Bradycardia/sinus rhythm with 2:1 AV block.  EKG as noted above. Initial troponin negative. No chest pain. TSH 4.87  Home meds: Diltiazem CD 240mg , last dose: 08/15/16@ 0830 -Cycle troponin negative 1 Continue to hold Cardizem -Await cardiology recommendations,PPM placement   #2 hypothyroidism due to acquired atrophy of thyroid. TSH 4.87. Home medications include Synthroid. -Continue Synthroid TSH 4.8 , free T4 1.31  #3. Hypertension. Poor control in the emergency department. Blood pressure on the high end of normal. Home medications include diltiazem, irbesartan and hydrochlorothiazide -Hold diltiazem for now do to #1 -Continue other home antihypertensives -Hydralazine as needed    #4. GERD. Appears stable at baseline. Continue home meds  #5. COPD. Not on home oxygen. Appears stable at baseline. Oxygen saturation level greater than 90% on room air. Chest x-ray with chronic hyperinflation -Continue home inhalers  #6. Anxiety. Appears stable at baseline -Continue home meds  #7. Hyperlipidemia -Continue statin  #8 hypokalemia-replete, magnesium 2.1   DVT  prophylaxis: lovenox   Code Status:  Full     Family Communication: Discussed in detail with the patient, all imaging results, lab results explained to the patient   Disposition Plan per cardiology,PPM       Consultants:  Cardiology  Procedures:  None  Antibiotics: Anti-infectives    None         HPI/Subjective: Complains of fatigue and sob  Objective: Vitals:   08/15/16 1300 08/15/16 2149 08/16/16 0225 08/16/16 0632  BP: (!) 156/60 (!) 159/46 (!) 145/48 (!) 162/47  Pulse: (!) 37 (!) 37 (!) 42 (!) 42  Resp: 18 18 18 18   Temp: 97.7 F (36.5 C) 98 F (36.7 C) 98.4 F (36.9 C) 98.7 F (37.1 C)  TempSrc: Oral Oral Oral Oral  SpO2: 96% 95% 96% 94%  Weight:    63 kg (138 lb 12.8 oz)  Height:        Intake/Output Summary (Last 24 hours) at 08/16/16 1000 Last data filed at 08/16/16 0800  Gross per 24 hour  Intake                0 ml  Output              350 ml  Net             -350 ml    Exam:  Examination:  General exam: Appears calm and comfortable  Respiratory system: Clear to auscultation. Respiratory effort normal. Cardiovascular system: S1 & S2 heard, RRR. No JVD, murmurs, rubs, gallops or clicks. No pedal edema. Gastrointestinal system: Abdomen is nondistended, soft and nontender. No organomegaly or masses felt. Normal bowel sounds heard. Central nervous system: Alert and oriented. No focal  neurological deficits. Extremities: Symmetric 5 x 5 power. Skin: No rashes, lesions or ulcers Psychiatry: Judgement and insight appear normal. Mood & affect appropriate.     Data Reviewed: I have personally reviewed following labs and imaging studies  Micro Results No results found for this or any previous visit (from the past 240 hour(s)).  Radiology Reports Dg Chest 2 View  Result Date: 08/15/2016 CLINICAL DATA:  82 year old female with shortness of breath and weakness. Initial encounter. EXAM: CHEST  2 VIEW COMPARISON:  07/11/2015 and earlier.  FINDINGS: One of the patient arms is not raised on the lateral view today. New small right pleural effusion is evident on the lateral. Stable lung volumes. A degree of chronic hyperinflation is noted. Stable cardiac size and mediastinal contours. Cardiac size at the upper limits of normal. Calcified aortic atherosclerosis. Visualized tracheal air column is within normal limits. No pneumothorax, pulmonary edema, or confluent pulmonary opacity. Osteopenia. No acute osseous abnormality identified. IMPRESSION: 1. New small right pleural effusion. 2. Chronic hyperinflation. Borderline to mild cardiomegaly. Calcified aortic atherosclerosis. Electronically Signed   By: Genevie Ann M.D.   On: 08/15/2016 13:10     CBC  Recent Labs Lab 08/15/16 1225 08/16/16 0226  WBC 8.2 7.6  HGB 13.2 11.8*  HCT 40.8 36.2  PLT 218 215  MCV 92.5 92.1  MCH 29.9 30.0  MCHC 32.4 32.6  RDW 14.1 14.2  LYMPHSABS 1.7  --   MONOABS 0.5  --   EOSABS 0.3  --   BASOSABS 0.1  --     Chemistries   Recent Labs Lab 08/15/16 1225 08/16/16 0226  NA 143 141  K 3.8 3.2*  CL 109 108  CO2 26 23  GLUCOSE 121* 99  BUN 17 13  CREATININE 0.84 0.77  CALCIUM 10.4* 9.8  MG 2.1  --    ------------------------------------------------------------------------------------------------------------------ estimated creatinine clearance is 45.9 mL/min (by C-G formula based on SCr of 0.77 mg/dL). ------------------------------------------------------------------------------------------------------------------ No results for input(s): HGBA1C in the last 72 hours. ------------------------------------------------------------------------------------------------------------------ No results for input(s): CHOL, HDL, LDLCALC, TRIG, CHOLHDL, LDLDIRECT in the last 72 hours. ------------------------------------------------------------------------------------------------------------------  Recent Labs  08/15/16 1225  TSH 4.876*  T3FREE 2.1    ------------------------------------------------------------------------------------------------------------------ No results for input(s): VITAMINB12, FOLATE, FERRITIN, TIBC, IRON, RETICCTPCT in the last 72 hours.  Coagulation profile No results for input(s): INR, PROTIME in the last 168 hours.  No results for input(s): DDIMER in the last 72 hours.  Cardiac Enzymes No results for input(s): CKMB, TROPONINI, MYOGLOBIN in the last 168 hours.  Invalid input(s): CK ------------------------------------------------------------------------------------------------------------------ Invalid input(s): POCBNP   CBG: No results for input(s): GLUCAP in the last 168 hours.     Studies: Dg Chest 2 View  Result Date: 08/15/2016 CLINICAL DATA:  82 year old female with shortness of breath and weakness. Initial encounter. EXAM: CHEST  2 VIEW COMPARISON:  07/11/2015 and earlier. FINDINGS: One of the patient arms is not raised on the lateral view today. New small right pleural effusion is evident on the lateral. Stable lung volumes. A degree of chronic hyperinflation is noted. Stable cardiac size and mediastinal contours. Cardiac size at the upper limits of normal. Calcified aortic atherosclerosis. Visualized tracheal air column is within normal limits. No pneumothorax, pulmonary edema, or confluent pulmonary opacity. Osteopenia. No acute osseous abnormality identified. IMPRESSION: 1. New small right pleural effusion. 2. Chronic hyperinflation. Borderline to mild cardiomegaly. Calcified aortic atherosclerosis. Electronically Signed   By: Genevie Ann M.D.   On: 08/15/2016 13:10      No results  found for: HGBA1C Lab Results  Component Value Date   LDLCALC 79 09/02/2015   CREATININE 0.77 08/16/2016       Scheduled Meds: . calcium-vitamin D  1 tablet Oral Q breakfast  . enoxaparin (LOVENOX) injection  40 mg Subcutaneous Q24H  . irbesartan  300 mg Oral Daily   And  . hydrochlorothiazide  12.5 mg  Oral Daily  . ipratropium  2 spray Each Nare TID  . levothyroxine  125 mcg Oral QAC breakfast  . multivitamin with minerals  1 tablet Oral Daily  . pantoprazole  40 mg Oral Daily  . rosuvastatin  5 mg Oral QHS  . sodium chloride flush  3 mL Intravenous Q12H   Continuous Infusions:   LOS: 0 days    Time spent: >30 MINS    [facility]  [facility] Pager [phone]. If 7PM-7AM, please contact night-coverage at [email or web address], password [email or web address] 08/16/2016, 10:00 AM  LOS: 0 days

## 2016-08-16 NOTE — H&P (View-Only) (Signed)
ELECTROPHYSIOLOGY CONSULT NOTE    Patient ID: Destiny [name] MRN: [id number], DOB/AGE: Jan 01, 1928 82 y.o.  Admit date: 08/15/2016 Date of Consult: 08/16/2016  Primary Physician: Hollace Kinnier, DO Primary Cardiologist: Dr. Einar Gip Requesting MD: Dr. Einar Gip  Reason for Consultation: high degree AVBlock  HPI: Destiny [name] is a 82 y.o. female with PMHx of HTN, HLD, COPD, anxiety, hypothyroidism, and GERD, admitted to [facility] yesterday with heart block.  She mentions for about 2 weeks feeling like she had no energy, and getting winded with exertion, no CP, no palpitations, she has not had any near syncope or syncope.  She came in with continued exertional intolerances with DOE.  In the ER she was observed o have 2:1 heartblock admitted.  Her home CCB and Cosopt (w/timolol) eye gtts have been held here.  She has no cardiac hx, was seen originally by Dr. Einar Gip outpatient for DOE, palpitations, surgical clearance undergoing an echo with nml LVEF and stress testing without ischemic in Jan 2017.  She reorts being started on B vitamin and her palpitations resolved.  LABS; K+ 3.8, 3.2 >> replaced this AM BUN/Creat 13/0.77 poc Trop 0.01 H/H 11/36 WBC 7.6 Plts 215 TSH 4.876, free T4 1.31  (both mildly elevated) on replacement T3 2.1  Home meds: Diltiazem CD 240mg , last dose: 08/15/16@ 0830, Cosopt eye gtts also last dose 08/15/16 @ 0830   Past Medical History:  Diagnosis Date  . Allergy   . Arthritis   . 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 Cataract   . Chronic anxiety   . Constipation   . Diverticulosis of sigmoid colon   . Emphysema of lung ([place])   . GERD (gastroesophageal reflux disease)   . Glaucoma   . H/O adenomatous polyp of colon    Dr. Cristina Gong  . High cholesterol   . Hypertension   . Hypothyroidism   . Insomnia   . Lactose intolerance   . LBBB (left bundle branch block)   . Lumbar degenerative disc disease   .  Onychomycosis   . Osteoarthritis   . Osteoporosis   . Palpitations   . Spinal stenosis   . Thyroid disease   . Varicose veins      Surgical History:  Past Surgical History:  Procedure Laterality Date  . ABDOMINAL HYSTERECTOMY    . BLADDER REPAIR    . blephoplasty bilateral  03/10/2014  . CATARACT EXTRACTION W/ INTRAOCULAR LENS  IMPLANT, BILATERAL  7 & 8 2014  . CHOLECYSTECTOMY    . COLONOSCOPY  05/18/2012  . EYE SURGERY    . herniated disc repair    . INCONTINENCE SURGERY    . JOINT REPLACEMENT    . LAMINECTOMY  02/2011   L3-L5  Dr. Ellene Route  . LUMBAR LAMINECTOMY/DECOMPRESSION MICRODISCECTOMY Right 09/21/2015   Procedure: Right Lumbar Three-FourMicrodiskectomy;  Surgeon: Kristeen Miss, MD;  Location: [place] [facility];  Service: Neurosurgery;  Laterality: Right;  Right L3-4 Microdiskectomy  . SHOULDER ARTHROSCOPY WITH ROTATOR CUFF REPAIR Right 08/2003  . TONSILLECTOMY    . TOTAL HIP ARTHROPLASTY     right 1999     Prescriptions Prior to Admission  Medication Sig Dispense Refill Last Dose  . Calcium-Phosphorus-Vitamin D (CITRACAL +D3) 250-107-500 MG-MG-UNIT CHEW Chew 1 tablet by mouth daily.   08/15/2016 at Unknown time  . denosumab (PROLIA) 60 MG/ML SOLN injection Inject 60 mg into the skin every 6 (six) months. Administer in upper arm, thigh, or  abdomen   >26yr at Unknown time  . diltiazem (CARDIZEM CD) 240 MG 24 hr capsule Take 240 mg by mouth daily.    08/15/2016 at Unknown time  . dorzolamide-timolol (COSOPT) 22.3-6.8 MG/ML ophthalmic solution Place 1 drop into both eyes 2 (two) times daily.  5 08/15/2016 at Unknown time  . ipratropium (ATROVENT) 0.06 % nasal spray Place 2 sprays into both nostrils 3 (three) times daily.   Past Week at Unknown time  . irbesartan-hydrochlorothiazide (AVALIDE) 300-12.5 MG tablet Take 1 tablet by mouth daily. 30 tablet 6 08/15/2016 at Unknown time  . LACTASE PO Take 1 tablet by mouth as needed (when eating dairy products).   Past Week at Unknown time  .  Multiple Vitamins-Minerals (MULTIVITAMIN WITH MINERALS) tablet Take 1 tablet by mouth daily.   08/15/2016 at Unknown time  . naproxen sodium (ANAPROX) 220 MG tablet Take 220 mg by mouth 2 (two) times daily with a meal.   08/15/2016 at Unknown time  . rosuvastatin (CRESTOR) 10 MG tablet Take 5 mg by mouth at bedtime.    08/14/2016 at Unknown time  . SYNTHROID 125 MCG tablet TAKE 1 TABLET BY MOUTH EVERY DAY 30 tablet 3 08/14/2016 at Unknown time  . bifidobacterium infantis (ALIGN) capsule Take 1 capsule by mouth daily.   Not Taking at Unknown time  . cetirizine (ZYRTEC) 10 MG tablet Take 10 mg by mouth. Take one tablet daily as needed   Not Taking at Unknown time  . clonazePAM (KLONOPIN) 0.5 MG tablet Take 0.25 mg by mouth 2 (two) times daily as needed for anxiety.    Not Taking at Unknown time  . fexofenadine (ALLEGRA) 180 MG tablet Take 180 mg by mouth daily.   Not Taking at Unknown time  . pantoprazole (PROTONIX) 40 MG tablet Take 40 mg by mouth daily.   Not Taking at Unknown time    Inpatient Medications:  . calcium-vitamin D  1 tablet Oral Q breakfast  . enoxaparin (LOVENOX) injection  40 mg Subcutaneous Q24H  . irbesartan  300 mg Oral Daily   And  . hydrochlorothiazide  12.5 mg Oral Daily  . ipratropium  2 spray Each Nare TID  . levothyroxine  125 mcg Oral QAC breakfast  . multivitamin with minerals  1 tablet Oral Daily  . pantoprazole  40 mg Oral Daily  . potassium chloride  20 mEq Oral Once  . rosuvastatin  5 mg Oral QHS  . sodium chloride flush  3 mL Intravenous Q12H    Allergies:  Allergies  Allergen Reactions  . Codeine Nausea Only  . Macrobid [Nitrofurantoin] Diarrhea  . Miralax [Polyethylene Glycol] Other (See Comments)    cramping  . Pain Relief [Acetaminophen] Nausea And Vomiting  . Paxil [Paroxetine Hcl] Nausea Only  . Percodan [Oxycodone-Aspirin] Itching  . Oxycodone Rash  . Oxycodone-Acetaminophen Rash  . Penicillins Rash    Has patient had a PCN reaction  causing immediate rash, facial/tongue/throat swelling, SOB or lightheadedness with hypotension: YES Has patient had a PCN reaction causing severe rash involving mucus membranes or skin necrosis: NO Has patient had a PCN reaction that required hospitalization NO Has patient had a PCN reaction occurring within the last 10 years: NO If all of the above answers are "NO", then may proceed with Cephalosporin use.    Social History   Social History  . Marital status: Widowed    Spouse name: N/A  . Number of children: N/A  . Years of education: N/A   Occupational  History  . Not on file.   Social History Main Topics  . Smoking status: Never Smoker  . Smokeless tobacco: Never Used  . Alcohol use No  . Drug use: No  . Sexual activity: No   Other Topics Concern  . Not on file   Social History Narrative   Lives at [facility]   Caffeine 1-2 cups daily   Exercise water aerobics, walking   Never smoked   POA         Diet:       Do you drink/ eat things with caffeine? Yes      Marital status:    Widow                           What year were you married ? 1957      Do you live in a house, apartment,assistred living, condo, trailer, etc.)?[place] [facility]      Is it one or more stories?       How many persons live in your home ? 1      Do you have any pets in your home ?(please list)0       Current or past profession: Social Worker      Do you exercise?  Yes                            Type & how often:  Water Aerobic, 3-4 hours per week       Do you have a living will? Yes      Do you have a DNR form? Yes                       If not, do you want to discuss one?       Do you have signed POA?HPOA forms?                 If so, please bring to your        appointment              Family History  Problem Relation Age of Onset  . Arthritis Mother   . Osteoporosis Mother   . Heart attack Father      Review of Systems: All other systems  reviewed and are otherwise negative except as noted above.  Physical Exam: Vitals:   08/15/16 1300 08/15/16 2149 08/16/16 0225 08/16/16 0632  BP: (!) 156/60 (!) 159/46 (!) 145/48 (!) 162/47  Pulse: (!) 37 (!) 37 (!) 42 (!) 42  Resp: 18 18 18 18   Temp: 97.7 F (36.5 C) 98 F (36.7 C) 98.4 F (36.9 C) 98.7 F (37.1 C)  TempSrc: Oral Oral Oral Oral  SpO2: 96% 95% 96% 94%  Weight:    138 lb 12.8 oz (63 kg)  Height:        GEN- The patient is well appearing, alert and oriented x 3 today.   HEENT: normocephalic, atraumatic; sclera clear, conjunctiva pink; hearing intact; oropharynx clear; neck supple, no JVP Lymph- no cervical lymphadenopathy Lungs- Clear to ausculation bilaterally, normal work of breathing.  No wheezes, rales, rhonchi Heart- Regular rate and rhythm, bradycardic, no murmurs, rubs or gallops, PMI not laterally displaced GI- soft, non-tender, non-distended, bowel sounds present Extremities- no clubbing, cyanosis, or edema MS- no significant deformity or atrophy Skin- warm and dry, no rash or  lesion Psych- euthymic mood, full affect Neuro- no gross deficits observed  Labs:   Lab Results  Component Value Date   WBC 7.6 08/16/2016   HGB 11.8 (L) 08/16/2016   HCT 36.2 08/16/2016   MCV 92.1 08/16/2016   PLT 215 08/16/2016    Recent Labs Lab 08/16/16 0226  NA 141  K 3.2*  CL 108  CO2 23  BUN 13  CREATININE 0.77  CALCIUM 9.8  GLUCOSE 99      Radiology/Studies:  Dg Chest 2 View Result Date: 08/15/2016 CLINICAL DATA:  82 year old female with shortness of breath and weakness. Initial encounter. EXAM: CHEST  2 VIEW COMPARISON:  07/11/2015 and earlier. FINDINGS: One of the patient arms is not raised on the lateral view today. New small right pleural effusion is evident on the lateral. Stable lung volumes. A degree of chronic hyperinflation is noted. Stable cardiac size and mediastinal contours. Cardiac size at the upper limits of normal. Calcified aortic  atherosclerosis. Visualized tracheal air column is within normal limits. No pneumothorax, pulmonary edema, or confluent pulmonary opacity. Osteopenia. No acute osseous abnormality identified. IMPRESSION: 1. New small right pleural effusion. 2. Chronic hyperinflation. Borderline to mild cardiomegaly. Calcified aortic atherosclerosis. Electronically Signed   By: Genevie Ann M.D.   On: 08/15/2016 13:10    EKG: 2:1 heart block, RBBB, LAFB, QRS duration 118ms TELEMETRY: persistent 2:1, as well as occassioinal 3:1 heart block  Echo: ordered, pending  Dr. Irven Shelling Office  Echo- 09/16/2015 1. Left ventricle cavity is normal in size. Normal global wall motion. Visual EF is approx. 55%. Doppler evidence of grade I (impaired) diastolic dysfunction with elevated LV filling pressure. 2. Left atrial cavity is borderline dilated. 3. Mild mitral regurgitation. Moderate calcification of the mitral valve annulus. Mild mitral valve leaflet calcification. Slightly restricted mitral valve leaflets. 4. Mild to moderate tricuspid regurgitation. No evidence of pulmonary hypertension.  Dr. Irven Shelling Office Lexiscan myoview stress test 09/14/2015: 1. The resting electrocardiogram demonstrated normal sinus rhythm, normal resting conduction, no resting arrhythmias and non specific ST change inferior and lateral leads.. Stress EKG is indeterminate for myocardial ischemia with 1 mm inferior and lateral ST depression and non specific T inversion. Stress symptoms included dyspnea. 2. Myocardial perfusion imaging is normal. Overall left ventricular systolic function was normal without regional wall motion abnormalities. The left ventricular ejection fraction was 71%. This represents a low risk study. Clinical correlation recommended given EKG abnormalities.  Assessment and Plan:   1. Fatigue, DOE, high degree AVBlock with 2:1, 3:1 conduction     24 hours off meds this morning     discusse possibility of needing PPM implant  risks/benefits with the patent, she would like to proceed  2. HTN     Stable, resume meds post pacer       Signed, Tommye Standard, PA-C 08/16/2016 8:04 AM   I have seen, examined the patient, and reviewed the above assessment and plan.  On exam, pleasant elderly female with bradycardia.  Telemetry reveals 2:1 mobitz II second degree AV block.  Changes to above are made where necessary.    The patient has symptomatic bradycardia which has not improved off of medical therapy.  I would therefore recommend pacemaker implantation at this time.  Risks, benefits, alternatives to pacemaker implantation were discussed in detail with the patient today. The patient understands that the risks include but are not limited to bleeding, infection, pneumothorax, perforation, tamponade, vascular damage, renal failure, MI, stroke, death,  and lead dislodgement and wishes to  proceed. We will therefore schedule the procedure at the next available time.  Will anticipate pacemaker later today.   Co Sign: Thompson Grayer, MD 08/16/2016 10:20 AM

## 2016-08-16 NOTE — Progress Notes (Signed)
Subjective:  No new symptoms  Objective:  Vital Signs in the last 24 hours: Temp:  [97.7 F (36.5 C)-98.7 F (37.1 C)] 98.7 F (37.1 C) (12/19 [id number]) Pulse Rate:  [37-49] 42 (12/19 0632) Resp:  [6-25] 18 (12/19 [id number]) BP: (145-189)/(46-89) 162/47 (12/19 [id number]) SpO2:  [92 %-97 %] 94 % (12/19 [id number]) Weight:  [63 kg (138 lb 12.8 oz)-63.5 kg (140 lb)] 63 kg (138 lb 12.8 oz) (12/19 [id number])  Intake/Output from previous day: 12/18 0701 - 12/19 0700 In: 0  Out: 350 [Urine:350]  Physical Exam:  General appearance: alert, cooperative, appears stated age and no distress Lungs: clear to auscultation bilaterally Heart: regular rate and rhythm, S1, S2 normal, no murmur, click, rub or gallop and Marked bradycardia present Abdomen: soft, non-tender; bowel sounds normal; no masses,  no organomegaly and Mildly obese Extremities: edema 1-2+ below-knee, full range of movements. No tenderness. Pulses: 2+ and symmetric Faint right carotid bruit present Neurologic: Grossly normal Lab Results: BMP  Recent Labs  05/12/16 1345 08/15/16 1225 08/16/16 0226  NA 143 143 141  K 4.2 3.8 3.2*  CL 108 109 108  CO2 25 26 23   GLUCOSE 112* 121* 99  BUN 18 17 13   CREATININE 0.81 0.84 0.77  CALCIUM 9.8 10.4* 9.8  GFRNONAA 66 >60 >60  GFRAA 77 >60 >60    CBC  Recent Labs Lab 08/15/16 1225 08/16/16 0226  WBC 8.2 7.6  RBC 4.41 3.93  HGB 13.2 11.8*  HCT 40.8 36.2  PLT 218 215  MCV 92.5 92.1  MCH 29.9 30.0  MCHC 32.4 32.6  RDW 14.1 14.2  LYMPHSABS 1.7  --   MONOABS 0.5  --   EOSABS 0.3  --   BASOSABS 0.1  --     Recent Labs  09/02/15 03/07/16 1146 08/15/16 1225  TSH 0.70 0.48 4.876*    Recent Labs  09/02/15 05/12/16 1345  PROT  --  6.1  ALBUMIN  --  4.3  AST 14 18  ALT 16 11  ALKPHOS  --  34  BILITOT  --  0.6    Imaging: Imaging results have been reviewed  Cardiac Studies:  EKG 08/15/2016: Sinus rhythm with 2:1 AV block, ventricular rate 45 bpm, left axis deviation, left  anterior fascicular block.  Right bundle branch block.  Normal QT interval.  No evidence of ischemia.  Office  Echo- 09/16/2015 1. Left ventricle cavity is normal in size. Normal global wall motion. Visual EF is approx. 55%. Doppler evidence of grade I (impaired) diastolic dysfunction with elevated LV filling pressure. 2. Left atrial cavity is borderline dilated. 3. Mild mitral regurgitation. Moderate calcification of the mitral valve annulus. Mild mitral valve leaflet calcification. Slightly restricted mitral valve leaflets. 4. Mild to moderate tricuspid regurgitation. No evidence of pulmonary hypertension.  Office Lexiscan myoview stress test 09/14/2015: 1. The resting electrocardiogram demonstrated normal sinus rhythm, normal resting conduction, no resting arrhythmias and non specific ST change inferior and lateral leads.. Stress EKG is indeterminate for myocardial ischemia with 1 mm inferior and lateral ST depression and non specific T inversion. Stress symptoms included dyspnea. 2. Myocardial perfusion imaging is normal. Overall left ventricular systolic function was normal without regional wall motion abnormalities. The left ventricular ejection fraction was 71%. This represents a low risk study. Clinical correlation recommended given EKG abnormalities.   Assessment/Plan:  1. Symptomatic high degree AV block, patient presented with 2:1 AV block, now on telemetry has third degree AV block with junctional escape, hemodynamically stable. Diltiazem on  hold, last dose earlier this morning. 2. Hypertension 3. Mild hyperlipidemia 4. Right carotid bruit  Rec: Patient continues to be in 2 degree Mobitz II AV Block with episodes of complete heart block and will need permanent pacemaker. I have contacted Dr. Rayann Heman et al. Will continue to follow.    Adrian Prows, M.D. 08/16/2016, 11:17 AM [facility] Pager: [phone] Office: [phone] If no answer: [phone]

## 2016-08-16 NOTE — Progress Notes (Signed)
  Echocardiogram 2D Echocardiogram has been performed.  Destiny [name] 08/16/2016, 4:32 PM

## 2016-08-16 NOTE — Discharge Instructions (Signed)
° ° °  Supplemental Discharge Instructions for  Pacemaker/Defibrillator Patients  Activity No heavy lifting or vigorous activity with your left/right arm for 6 to 8 weeks.  Do not raise your left/right arm above your head for one week.  Gradually raise your affected arm as drawn below.             08/20/16                  08/21/16                  08/22/16                 08/23/16 __  NO DRIVING for 1 week   ; you may begin driving on [id number]    .  WOUND CARE - Keep the wound area clean and dry.  Do not get this area wet, no showers until cleared to at your wound check visit. - The tape/steri-strips on your wound will fall off; do not pull them off.  No bandage is needed on the site.  DO  NOT apply any creams, oils, or ointments to the wound area. - If you notice any drainage or discharge from the wound, any swelling or bruising at the site, or you develop a fever > 101? F after you are discharged home, call the office at once.  Special Instructions - You are still able to use cellular telephones; use the ear opposite the side where you have your pacemaker/defibrillator.  Avoid carrying your cellular phone near your device. - When traveling through airports, show security personnel your identification card to avoid being screened in the metal detectors.  Ask the security personnel to use the hand wand. - Avoid arc welding equipment, MRI testing (magnetic resonance imaging), TENS units (transcutaneous nerve stimulators).  Call the office for questions about other devices. - Avoid electrical appliances that are in poor condition or are not properly grounded. - Microwave ovens are safe to be near or to operate.  Additional information for defibrillator patients should your device go off: - If your device goes off ONCE and you feel fine afterward, notify the device clinic nurses. - If your device goes off ONCE and you do not feel well afterward, call 911. - If your device goes off TWICE, call  911. - If your device goes off THREE times in one day, call 911.  DO NOT DRIVE YOURSELF OR A FAMILY MEMBER WITH A DEFIBRILLATOR TO THE HOSPITAL--CALL 911.

## 2016-08-16 NOTE — Interval H&P Note (Signed)
History and Physical Interval Note:  08/16/2016 10:23 AM  Destiny [name]  has presented today for surgery, with the diagnosis of hf  The various methods of treatment have been discussed with the patient and family. After consideration of risks, benefits and other options for treatment, the patient has consented to  Procedure(s): Pacemaker Implant (N/A) as a surgical intervention .  The patient's history has been reviewed, patient examined, no change in status, stable for surgery.  I have reviewed the patient's chart and labs.  Questions were answered to the patient's satisfaction.     Thompson Grayer

## 2016-08-16 NOTE — Consult Note (Signed)
ELECTROPHYSIOLOGY CONSULT NOTE    Patient ID: Destiny [name] MRN: [id number], DOB/AGE: 80-May-1929 80 y.o.  Admit date: 08/15/2016 Date of Consult: 08/16/2016  Primary Physician: Destiny Kinnier, DO Primary Cardiologist: Dr. Einar [name] Requesting MD: Dr. Einar [name]  Reason for Consultation: high degree AVBlock  HPI: Destiny [name] is a 82 y.o. female with PMHx of HTN, HLD, COPD, anxiety, hypothyroidism, and GERD, admitted to [facility] yesterday with heart block.  She mentions for about 2 weeks feeling like she had no energy, and getting winded with exertion, no CP, no palpitations, she has not had any near syncope or syncope.  She came in with continued exertional intolerances with DOE.  In the ER she was observed o have 2:1 heartblock admitted.  Her home CCB and Cosopt (w/timolol) eye gtts have been held here.  She has no cardiac hx, was seen originally by Dr. Einar [name] outpatient for DOE, palpitations, surgical clearance undergoing an echo with nml LVEF and stress testing without ischemic in Jan 2017.  She reorts being started on B vitamin and her palpitations resolved.  LABS; K+ 3.8, 3.2 >> replaced this AM BUN/Creat 13/0.77 poc Trop 0.01 H/H 11/36 WBC 7.6 Plts 215 TSH 4.876, free T4 1.31  (both mildly elevated) on replacement T3 2.1  Home meds: Diltiazem CD 240mg , last dose: 08/15/16@ 0830, Cosopt eye gtts also last dose 08/15/16 @ 0830   Past Medical History:  Diagnosis Date  . Allergy   . Arthritis   . 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 Cataract   . Chronic anxiety   . Constipation   . Diverticulosis of sigmoid colon   . Emphysema of lung ([place])   . GERD (gastroesophageal reflux disease)   . Glaucoma   . H/O adenomatous polyp of colon    Dr. Cristina [name]  . High cholesterol   . Hypertension   . Hypothyroidism   . Insomnia   . Lactose intolerance   . LBBB (left bundle branch block)   . Lumbar degenerative disc disease   .  Onychomycosis   . Osteoarthritis   . Osteoporosis   . Palpitations   . Spinal stenosis   . Thyroid disease   . Varicose veins      Surgical History:  Past Surgical History:  Procedure Laterality Date  . ABDOMINAL HYSTERECTOMY    . BLADDER REPAIR    . blephoplasty bilateral  03/10/2014  . CATARACT EXTRACTION W/ INTRAOCULAR LENS  IMPLANT, BILATERAL  7 & 8 2014  . CHOLECYSTECTOMY    . COLONOSCOPY  05/18/2012  . EYE SURGERY    . herniated disc repair    . INCONTINENCE SURGERY    . JOINT REPLACEMENT    . LAMINECTOMY  02/2011   L3-L5  Dr. Ellene Route  . LUMBAR LAMINECTOMY/DECOMPRESSION MICRODISCECTOMY Right 09/21/2015   Procedure: Right Lumbar Three-FourMicrodiskectomy;  Surgeon: Destiny Miss, MD;  Location: [place] [facility];  Service: Neurosurgery;  Laterality: Right;  Right L3-4 Microdiskectomy  . SHOULDER ARTHROSCOPY WITH ROTATOR CUFF REPAIR Right 08/2003  . TONSILLECTOMY    . TOTAL HIP ARTHROPLASTY     right 1999     Prescriptions Prior to Admission  Medication Sig Dispense Refill Last Dose  . Calcium-Phosphorus-Vitamin D (CITRACAL +D3) 250-107-500 MG-MG-UNIT CHEW Chew 1 tablet by mouth daily.   08/15/2016 at Unknown time  . denosumab (PROLIA) 60 MG/ML SOLN injection Inject 60 mg into the skin every 6 (six) months. Administer in upper arm, thigh, or  abdomen   >43yr at Unknown time  . diltiazem (CARDIZEM CD) 240 MG 24 hr capsule Take 240 mg by mouth daily.    08/15/2016 at Unknown time  . dorzolamide-timolol (COSOPT) 22.3-6.8 MG/ML ophthalmic solution Place 1 drop into both eyes 2 (two) times daily.  5 08/15/2016 at Unknown time  . ipratropium (ATROVENT) 0.06 % nasal spray Place 2 sprays into both nostrils 3 (three) times daily.   Past Week at Unknown time  . irbesartan-hydrochlorothiazide (AVALIDE) 300-12.5 MG tablet Take 1 tablet by mouth daily. 30 tablet 6 08/15/2016 at Unknown time  . LACTASE PO Take 1 tablet by mouth as needed (when eating dairy products).   Past Week at Unknown time  .  Multiple Vitamins-Minerals (MULTIVITAMIN WITH MINERALS) tablet Take 1 tablet by mouth daily.   08/15/2016 at Unknown time  . naproxen sodium (ANAPROX) 220 MG tablet Take 220 mg by mouth 2 (two) times daily with a meal.   08/15/2016 at Unknown time  . rosuvastatin (CRESTOR) 10 MG tablet Take 5 mg by mouth at bedtime.    08/14/2016 at Unknown time  . SYNTHROID 125 MCG tablet TAKE 1 TABLET BY MOUTH EVERY DAY 30 tablet 3 08/14/2016 at Unknown time  . bifidobacterium infantis (ALIGN) capsule Take 1 capsule by mouth daily.   Not Taking at Unknown time  . cetirizine (ZYRTEC) 10 MG tablet Take 10 mg by mouth. Take one tablet daily as needed   Not Taking at Unknown time  . clonazePAM (KLONOPIN) 0.5 MG tablet Take 0.25 mg by mouth 2 (two) times daily as needed for anxiety.    Not Taking at Unknown time  . fexofenadine (ALLEGRA) 180 MG tablet Take 180 mg by mouth daily.   Not Taking at Unknown time  . pantoprazole (PROTONIX) 40 MG tablet Take 40 mg by mouth daily.   Not Taking at Unknown time    Inpatient Medications:  . calcium-vitamin D  1 tablet Oral Q breakfast  . enoxaparin (LOVENOX) injection  40 mg Subcutaneous Q24H  . irbesartan  300 mg Oral Daily   And  . hydrochlorothiazide  12.5 mg Oral Daily  . ipratropium  2 spray Each Nare TID  . levothyroxine  125 mcg Oral QAC breakfast  . multivitamin with minerals  1 tablet Oral Daily  . pantoprazole  40 mg Oral Daily  . potassium chloride  20 mEq Oral Once  . rosuvastatin  5 mg Oral QHS  . sodium chloride flush  3 mL Intravenous Q12H    Allergies:  Allergies  Allergen Reactions  . Codeine Nausea Only  . Macrobid [Nitrofurantoin] Diarrhea  . Miralax [Polyethylene Glycol] Other (See Comments)    cramping  . Pain Relief [Acetaminophen] Nausea And Vomiting  . Paxil [Paroxetine Hcl] Nausea Only  . Percodan [Oxycodone-Aspirin] Itching  . Oxycodone Rash  . Oxycodone-Acetaminophen Rash  . Penicillins Rash    Has patient had a PCN reaction  causing immediate rash, facial/tongue/throat swelling, SOB or lightheadedness with hypotension: YES Has patient had a PCN reaction causing severe rash involving mucus membranes or skin necrosis: NO Has patient had a PCN reaction that required hospitalization NO Has patient had a PCN reaction occurring within the last 10 years: NO If all of the above answers are "NO", then may proceed with Cephalosporin use.    Social History   Social History  . Marital status: Widowed    Spouse name: N/A  . Number of children: N/A  . Years of education: N/A   Occupational  History  . Not on file.   Social History Main Topics  . Smoking status: Never Smoker  . Smokeless tobacco: Never Used  . Alcohol use No  . Drug use: No  . Sexual activity: No   Other Topics Concern  . Not on file   Social History Narrative   Lives at [facility]   Caffeine 1-2 cups daily   Exercise water aerobics, walking   Never smoked   POA         Diet:       Do you drink/ eat things with caffeine? Yes      Marital status:    Widow                           What year were you married ? 1957      Do you live in a house, apartment,assistred living, condo, trailer, etc.)?[facility]      Is it one or more stories?       How many persons live in your home ? 1      Do you have any pets in your home ?(please list)0       Current or past profession: Social Worker      Do you exercise?  Yes                            Type & how often:  Water Aerobic, 3-4 hours per week       Do you have a living will? Yes      Do you have a DNR form? Yes                       If not, do you want to discuss one?       Do you have signed POA?HPOA forms?                 If so, please bring to your        appointment              Family History  Problem Relation Age of Onset  . Arthritis Mother   . Osteoporosis Mother   . Heart attack Father      Review of Systems: All other systems  reviewed and are otherwise negative except as noted above.  Physical Exam: Vitals:   08/15/16 1300 08/15/16 2149 08/16/16 0225 08/16/16 0632  BP: (!) 156/60 (!) 159/46 (!) 145/48 (!) 162/47  Pulse: (!) 37 (!) 37 (!) 42 (!) 42  Resp: 18 18 18 18   Temp: 97.7 F (36.5 C) 98 F (36.7 C) 98.4 F (36.9 C) 98.7 F (37.1 C)  TempSrc: Oral Oral Oral Oral  SpO2: 96% 95% 96% 94%  Weight:    138 lb 12.8 oz (63 kg)  Height:        GEN- The patient is well appearing, alert and oriented x 3 today.   HEENT: normocephalic, atraumatic; sclera clear, conjunctiva pink; hearing intact; oropharynx clear; neck supple, no JVP Lymph- no cervical lymphadenopathy Lungs- Clear to ausculation bilaterally, normal work of breathing.  No wheezes, rales, rhonchi Heart- Regular rate and rhythm, bradycardic, no murmurs, rubs or gallops, PMI not laterally displaced GI- soft, non-tender, non-distended, bowel sounds present Extremities- no clubbing, cyanosis, or edema MS- no significant deformity or atrophy Skin- warm and dry, no rash or  lesion Psych- euthymic mood, full affect Neuro- no gross deficits observed  Labs:   Lab Results  Component Value Date   WBC 7.6 08/16/2016   HGB 11.8 (L) 08/16/2016   HCT 36.2 08/16/2016   MCV 92.1 08/16/2016   PLT 215 08/16/2016    Recent Labs Lab 08/16/16 0226  NA 141  K 3.2*  CL 108  CO2 23  BUN 13  CREATININE 0.77  CALCIUM 9.8  GLUCOSE 99      Radiology/Studies:  Dg Chest 2 View Result Date: 08/15/2016 CLINICAL DATA:  82 year old female with shortness of breath and weakness. Initial encounter. EXAM: CHEST  2 VIEW COMPARISON:  07/11/2015 and earlier. FINDINGS: One of the patient arms is not raised on the lateral view today. New small right pleural effusion is evident on the lateral. Stable lung volumes. A degree of chronic hyperinflation is noted. Stable cardiac size and mediastinal contours. Cardiac size at the upper limits of normal. Calcified aortic  atherosclerosis. Visualized tracheal air column is within normal limits. No pneumothorax, pulmonary edema, or confluent pulmonary opacity. Osteopenia. No acute osseous abnormality identified. IMPRESSION: 1. New small right pleural effusion. 2. Chronic hyperinflation. Borderline to mild cardiomegaly. Calcified aortic atherosclerosis. Electronically Signed   By: Genevie Ann M.D.   On: 08/15/2016 13:10    EKG: 2:1 heart block, RBBB, LAFB, QRS duration 132ms TELEMETRY: persistent 2:1, as well as occassioinal 3:1 heart block  Echo: ordered, pending  Dr. Irven Shelling Office  Echo- 09/16/2015 1. Left ventricle cavity is normal in size. Normal global wall motion. Visual EF is approx. 55%. Doppler evidence of grade I (impaired) diastolic dysfunction with elevated LV filling pressure. 2. Left atrial cavity is borderline dilated. 3. Mild mitral regurgitation. Moderate calcification of the mitral valve annulus. Mild mitral valve leaflet calcification. Slightly restricted mitral valve leaflets. 4. Mild to moderate tricuspid regurgitation. No evidence of pulmonary hypertension.  Dr. Irven Shelling Office Lexiscan myoview stress test 09/14/2015: 1. The resting electrocardiogram demonstrated normal sinus rhythm, normal resting conduction, no resting arrhythmias and non specific ST change inferior and lateral leads.. Stress EKG is indeterminate for myocardial ischemia with 1 mm inferior and lateral ST depression and non specific T inversion. Stress symptoms included dyspnea. 2. Myocardial perfusion imaging is normal. Overall left ventricular systolic function was normal without regional wall motion abnormalities. The left ventricular ejection fraction was 71%. This represents a low risk study. Clinical correlation recommended given EKG abnormalities.  Assessment and Plan:   1. Fatigue, DOE, high degree AVBlock with 2:1, 3:1 conduction     24 hours off meds this morning     discusse possibility of needing PPM implant  risks/benefits with the patent, she would like to proceed  2. HTN     Stable, resume meds post pacer       Signed, Tommye Standard, PA-C 08/16/2016 8:04 AM   I have seen, examined the patient, and reviewed the above assessment and plan.  On exam, pleasant elderly female with bradycardia.  Telemetry reveals 2:1 mobitz II second degree AV block.  Changes to above are made where necessary.    The patient has symptomatic bradycardia which has not improved off of medical therapy.  I would therefore recommend pacemaker implantation at this time.  Risks, benefits, alternatives to pacemaker implantation were discussed in detail with the patient today. The patient understands that the risks include but are not limited to bleeding, infection, pneumothorax, perforation, tamponade, vascular damage, renal failure, MI, stroke, death,  and lead dislodgement and wishes to  proceed. We will therefore schedule the procedure at the next available time.  Will anticipate pacemaker later today.   Co Sign: Thompson Grayer, MD 08/16/2016 10:20 AM

## 2016-08-17 ENCOUNTER — Encounter (HOSPITAL_COMMUNITY): Payer: Self-pay | Admitting: Internal Medicine

## 2016-08-17 ENCOUNTER — Inpatient Hospital Stay (HOSPITAL_COMMUNITY): Payer: Medicare Other

## 2016-08-17 DIAGNOSIS — K219 Gastro-esophageal reflux disease without esophagitis: Secondary | ICD-10-CM

## 2016-08-17 DIAGNOSIS — J411 Mucopurulent chronic bronchitis: Secondary | ICD-10-CM

## 2016-08-17 LAB — COMPREHENSIVE METABOLIC PANEL
ALT: 16 U/L (ref 14–54)
AST: 22 U/L (ref 15–41)
Albumin: 3.5 g/dL (ref 3.5–5.0)
Alkaline Phosphatase: 52 U/L (ref 38–126)
Anion gap: 4 — ABNORMAL LOW (ref 5–15)
BUN: 13 mg/dL (ref 6–20)
CHLORIDE: 110 mmol/L (ref 101–111)
CO2: 26 mmol/L (ref 22–32)
Calcium: 9.3 mg/dL (ref 8.9–10.3)
Creatinine, Ser: 1.02 mg/dL — ABNORMAL HIGH (ref 0.44–1.00)
GFR, EST AFRICAN AMERICAN: 56 mL/min — AB (ref >60)
GFR, EST NON AFRICAN AMERICAN: 49 mL/min — AB (ref >60)
Glucose, Bld: 98 mg/dL (ref 65–99)
POTASSIUM: 4 mmol/L (ref 3.5–5.1)
SODIUM: 140 mmol/L (ref 135–145)
Total Bilirubin: 1 mg/dL (ref 0.3–1.2)
Total Protein: 5.2 g/dL — ABNORMAL LOW (ref 6.5–8.1)

## 2016-08-17 LAB — CBC
HEMATOCRIT: 39.3 % (ref 36.0–46.0)
HEMOGLOBIN: 12.4 g/dL (ref 12.0–15.0)
MCH: 29.5 pg (ref 26.0–34.0)
MCHC: 31.6 g/dL (ref 30.0–36.0)
MCV: 93.3 fL (ref 78.0–100.0)
Platelets: 209 10*3/uL (ref 150–400)
RBC: 4.21 MIL/uL (ref 3.87–5.11)
RDW: 14.3 % (ref 11.5–15.5)
WBC: 8.3 10*3/uL (ref 4.0–10.5)

## 2016-08-17 MED ORDER — DORZOLAMIDE HCL 2 % OP SOLN
1.0000 [drp] | Freq: Two times a day (BID) | OPHTHALMIC | Status: DC
  Administered 2016-08-17 – 2016-08-18 (×2): 1 [drp] via OPHTHALMIC
  Filled 2016-08-17: qty 10

## 2016-08-17 MED ORDER — DORZOLAMIDE HCL-TIMOLOL MAL 2-0.5 % OP SOLN
1.0000 [drp] | Freq: Two times a day (BID) | OPHTHALMIC | Status: DC
  Filled 2016-08-17: qty 10

## 2016-08-17 MED ORDER — TIMOLOL MALEATE 0.5 % OP SOLN
1.0000 [drp] | Freq: Two times a day (BID) | OPHTHALMIC | Status: DC
  Filled 2016-08-17: qty 5

## 2016-08-17 NOTE — Consult Note (Signed)
[facility] CM Primary Care Navigator  08/17/2016  Destiny [name] 1928/08/18 [id number]  Met with patient at the bedside to identify possible discharge needs. Patient reports having worsening generalized weakness over the past few weeks and increased fatigue that led to this admission/surgery.  Patient endorses Hollace Kinnier, DO with [facility] as the primary care provider.    Patient shared using [facility] pharmacy at [facility] to obtain medications without difficulty so far.  Patient reports managing her own medications at home using "pill box" system weekly"   She states being able to drive prior to admission. Daughter Destiny [name]) will provide transportation to her doctors' appointments if needed. Patient also mentioned that [facility] can provide transport to doctor's appointments when she needs it.  Patient lives alone and is independent with self care at [facility]. Daughter can help with care needs if necessary. Patient states being able to call facility nurse to assist her when needed, otherwise, she is the primary caregiver for herself as stated.  Discharge plan is home/ independent living at [facility] per patient.  Patient voiced understanding to call primary care provider's office when she returns back to [facility], for a post discharge follow-up appointment within a week or sooner if needed. Patient letter provided as her reminder.  She denies care coordination needs or concerns at this time. [facility] care management contact information given for future needs that may arise.  For additional questions please contact:  Edwena Felty A. Cali Cuartas, BSN, RN-BC [facility] PRIMARY CARE Navigator Cell: [phone]

## 2016-08-17 NOTE — Progress Notes (Signed)
[facility] PROGRESS NOTE  Destiny [name] [id number] DOB: 1928-01-12 DOA: 08/15/2016   PCP: Hollace Kinnier, DO     Assessment/Plan: Principal Problem:   Bradycardia Active Problems:   HLD (hyperlipidemia)   COPD (chronic obstructive pulmonary disease) (HCC)   Anxiety state   HTN (hypertension)   Gastroesophageal reflux disease without esophagitis   DDD (degenerative disc disease), lumbar   Hypothyroidism due to acquired atrophy of thyroid   General weakness   AV block, 2nd degree   Second degree Mobitz II AV block   82 y.o. female with PMHx of HTN, HLD, COPD, anxiety, hypothyroidism, and GERD, admitted to [facility] yesterday with heart block.  She mentions for about 2 weeks feeling like she had no energy, and getting winded with exertion, no CP, no palpitations. Electrophysiology consulted. Status post pacemaker placement  Assessment and plan #1. Bradycardia/sinus rhythm with 2:1 AV block.  EKG as noted above. Initial troponin negative. No chest pain. TSH 4.87  Home meds: Diltiazem CD 240mg , last dose: 08/15/16@ 0830 -Cycle troponin negative 1  Cardiology resumed Cardizem Status post PPM placement, small pneumothorax, monitor 1 more day.No indication for intervention   #2 hypothyroidism due to acquired atrophy of thyroid. TSH 4.87. Home medications include Synthroid. -Continue Synthroid TSH 4.8 , free T4 1.31  #3. Hypertension. Poor control in the emergency department. Blood pressure on the high end of normal. Home medications include diltiazem, irbesartan and hydrochlorothiazide Cardiology resumed diltiazem   -Continue other home antihypertensives      #4. GERD. Appears stable at baseline. Continue home meds  #5. COPD. Not on home oxygen. Appears stable at baseline. Oxygen saturation level greater than 90% on room air. Chest x-ray with chronic hyperinflation -Continue home inhalers  #6. Anxiety. Appears stable at baseline -Continue home  meds  #7. Hyperlipidemia -Continue statin  #8 hypokalemia-repleted , magnesium 2.1   DVT prophylaxis: lovenox   Code Status:  Full     Family Communication: Discussed in detail with the patient, all imaging results, lab results explained to the patient   Disposition Plan per cardiology,PPM       Consultants:  Cardiology  Procedures:  None  Antibiotics: Anti-infectives    Start     Dose/Rate Route Frequency Ordered Stop   08/17/16 0000  vancomycin (VANCOCIN) IVPB 1000 mg/200 mL premix     1,000 mg 200 mL/hr over 60 Minutes Intravenous Every 12 hours 08/16/16 1511 08/17/16 0052   08/16/16 1100  gentamicin (GARAMYCIN) 80 mg in sodium chloride irrigation 0.9 % 500 mL irrigation     80 mg Irrigation On call 08/16/16 1048 08/16/16 1432   08/16/16 1100  vancomycin (VANCOCIN) IVPB 1000 mg/200 mL premix     1,000 mg 200 mL/hr over 60 Minutes Intravenous On call 08/16/16 1048 08/16/16 1421         HPI/Subjective: Denies any shortness of breath, some soreness at the pacemaker site  Objective: Vitals:   08/16/16 1615 08/16/16 1630 08/16/16 2009 08/17/16 0454  BP: (!) 167/67 (!) 155/69 (!) 147/63 (!) 148/64  Pulse: 75 78 79 81  Resp:   18 18  Temp:   98.1 F (36.7 C) 98.3 F (36.8 C)  TempSrc:   Oral Oral  SpO2:   93% 93%  Weight:    62.6 kg (138 lb)  Height:        Intake/Output Summary (Last 24 hours) at 08/17/16 1103 Last data filed at 08/17/16 0006  Gross per 24 hour  Intake  680 ml  Output              200 ml  Net              480 ml    Exam:  Examination:  General exam: Appears calm and comfortable  Respiratory system: Clear to auscultation. Respiratory effort normal. Cardiovascular system: S1 & S2 heard, RRR. No JVD, murmurs, rubs, gallops or clicks. No pedal edema. Gastrointestinal system: Abdomen is nondistended, soft and nontender. No organomegaly or masses felt. Normal bowel sounds heard. Central nervous system: Alert and  oriented. No focal neurological deficits. Extremities: Symmetric 5 x 5 power. Skin: No rashes, lesions or ulcers Psychiatry: Judgement and insight appear normal. Mood & affect appropriate.     Data Reviewed: I have personally reviewed following labs and imaging studies  Micro Results No results found for this or any previous visit (from the past 240 hour(s)).  Radiology Reports Dg Chest 2 View  Result Date: 08/17/2016 CLINICAL DATA:  Recent pacemaker placement EXAM: CHEST  2 VIEW COMPARISON:  08/15/2016 FINDINGS: Cardiac shadow is within normal limits. Pacing device is now seen. A small left apical pneumothorax is noted. The lungs are otherwise hyperinflated without focal infiltrate. No acute bony abnormality is noted. IMPRESSION: Small left apical pneumothorax with less than 1 cm excursion. These results will be called to the ordering clinician or representative by the Radiologist Assistant, and communication documented in the PACS or zVision Dashboard. Electronically Signed   By: Inez Catalina M.D.   On: 08/17/2016 08:47   Dg Chest 2 View  Result Date: 08/15/2016 CLINICAL DATA:  82 year old female with shortness of breath and weakness. Initial encounter. EXAM: CHEST  2 VIEW COMPARISON:  07/11/2015 and earlier. FINDINGS: One of the patient arms is not raised on the lateral view today. New small right pleural effusion is evident on the lateral. Stable lung volumes. A degree of chronic hyperinflation is noted. Stable cardiac size and mediastinal contours. Cardiac size at the upper limits of normal. Calcified aortic atherosclerosis. Visualized tracheal air column is within normal limits. No pneumothorax, pulmonary edema, or confluent pulmonary opacity. Osteopenia. No acute osseous abnormality identified. IMPRESSION: 1. New small right pleural effusion. 2. Chronic hyperinflation. Borderline to mild cardiomegaly. Calcified aortic atherosclerosis. Electronically Signed   By: Genevie Ann M.D.   On:  08/15/2016 13:10     CBC  Recent Labs Lab 08/15/16 1225 08/16/16 0226 08/17/16 0202  WBC 8.2 7.6 8.3  HGB 13.2 11.8* 12.4  HCT 40.8 36.2 39.3  PLT 218 215 209  MCV 92.5 92.1 93.3  MCH 29.9 30.0 29.5  MCHC 32.4 32.6 31.6  RDW 14.1 14.2 14.3  LYMPHSABS 1.7  --   --   MONOABS 0.5  --   --   EOSABS 0.3  --   --   BASOSABS 0.1  --   --     Chemistries   Recent Labs Lab 08/15/16 1225 08/16/16 0226 08/17/16 0202  NA 143 141 140  K 3.8 3.2* 4.0  CL 109 108 110  CO2 26 23 26   GLUCOSE 121* 99 98  BUN 17 13 13   CREATININE 0.84 0.77 1.02*  CALCIUM 10.4* 9.8 9.3  MG 2.1  --   --   AST  --   --  22  ALT  --   --  16  ALKPHOS  --   --  52  BILITOT  --   --  1.0   ------------------------------------------------------------------------------------------------------------------ estimated creatinine clearance is  33.4 mL/min (by C-G formula based on SCr of 1.02 mg/dL (H)). ------------------------------------------------------------------------------------------------------------------ No results for input(s): HGBA1C in the last 72 hours. ------------------------------------------------------------------------------------------------------------------ No results for input(s): CHOL, HDL, LDLCALC, TRIG, CHOLHDL, LDLDIRECT in the last 72 hours. ------------------------------------------------------------------------------------------------------------------  Recent Labs  08/15/16 1225  TSH 4.876*  T3FREE 2.1   ------------------------------------------------------------------------------------------------------------------ No results for input(s): VITAMINB12, FOLATE, FERRITIN, TIBC, IRON, RETICCTPCT in the last 72 hours.  Coagulation profile No results for input(s): INR, PROTIME in the last 168 hours.  No results for input(s): DDIMER in the last 72 hours.  Cardiac Enzymes No results for input(s): CKMB, TROPONINI, MYOGLOBIN in the last 168 hours.  Invalid input(s):  CK ------------------------------------------------------------------------------------------------------------------ Invalid input(s): POCBNP   CBG: No results for input(s): GLUCAP in the last 168 hours.     Studies: Dg Chest 2 View  Result Date: 08/17/2016 CLINICAL DATA:  Recent pacemaker placement EXAM: CHEST  2 VIEW COMPARISON:  08/15/2016 FINDINGS: Cardiac shadow is within normal limits. Pacing device is now seen. A small left apical pneumothorax is noted. The lungs are otherwise hyperinflated without focal infiltrate. No acute bony abnormality is noted. IMPRESSION: Small left apical pneumothorax with less than 1 cm excursion. These results will be called to the ordering clinician or representative by the Radiologist Assistant, and communication documented in the PACS or zVision Dashboard. Electronically Signed   By: Inez Catalina M.D.   On: 08/17/2016 08:47   Dg Chest 2 View  Result Date: 08/15/2016 CLINICAL DATA:  82 year old female with shortness of breath and weakness. Initial encounter. EXAM: CHEST  2 VIEW COMPARISON:  07/11/2015 and earlier. FINDINGS: One of the patient arms is not raised on the lateral view today. New small right pleural effusion is evident on the lateral. Stable lung volumes. A degree of chronic hyperinflation is noted. Stable cardiac size and mediastinal contours. Cardiac size at the upper limits of normal. Calcified aortic atherosclerosis. Visualized tracheal air column is within normal limits. No pneumothorax, pulmonary edema, or confluent pulmonary opacity. Osteopenia. No acute osseous abnormality identified. IMPRESSION: 1. New small right pleural effusion. 2. Chronic hyperinflation. Borderline to mild cardiomegaly. Calcified aortic atherosclerosis. Electronically Signed   By: Genevie Ann M.D.   On: 08/15/2016 13:10      No results found for: HGBA1C Lab Results  Component Value Date   LDLCALC 79 09/02/2015   CREATININE 1.02 (H) 08/17/2016       Scheduled  Meds: . calcium-vitamin D  1 tablet Oral Q breakfast  . diltiazem  240 mg Oral Daily  . irbesartan  300 mg Oral Daily   And  . hydrochlorothiazide  12.5 mg Oral Daily  . ipratropium  2 spray Each Nare TID  . levothyroxine  125 mcg Oral QAC breakfast  . multivitamin with minerals  1 tablet Oral Daily  . pantoprazole  40 mg Oral Daily  . rosuvastatin  5 mg Oral QHS  . sodium chloride flush  3 mL Intravenous Q12H   Continuous Infusions:   LOS: 1 day    Time spent: >30 MINS    [facility]  [facility] Pager [phone]. If 7PM-7AM, please contact night-coverage at [email or web address], password [facility] 08/17/2016, 11:03 AM  LOS: 1 day

## 2016-08-17 NOTE — Progress Notes (Signed)
SUBJECTIVE: The patient is doing well today.  At this time, she denies chest pain, shortness of breath, or any new concerns.  States she felt better immediately yesterday  . calcium-vitamin D  1 tablet Oral Q breakfast  . diltiazem  240 mg Oral Daily  . irbesartan  300 mg Oral Daily   And  . hydrochlorothiazide  12.5 mg Oral Daily  . ipratropium  2 spray Each Nare TID  . levothyroxine  125 mcg Oral QAC breakfast  . multivitamin with minerals  1 tablet Oral Daily  . pantoprazole  40 mg Oral Daily  . rosuvastatin  5 mg Oral QHS  . sodium chloride flush  3 mL Intravenous Q12H     OBJECTIVE: Physical Exam: Vitals:   08/16/16 1615 08/16/16 1630 08/16/16 2009 08/17/16 0454  BP: (!) 167/67 (!) 155/69 (!) 147/63 (!) 148/64  Pulse: 75 78 79 81  Resp:   18 18  Temp:   98.1 F (36.7 C) 98.3 F (36.8 C)  TempSrc:   Oral Oral  SpO2:   93% 93%  Weight:    138 lb (62.6 kg)  Height:        Intake/Output Summary (Last 24 hours) at 08/17/16 0818 Last data filed at 08/17/16 0006  Gross per 24 hour  Intake              680 ml  Output              200 ml  Net              480 ml    Telemetry is reviewed by myself, SR/V pacing  GEN- The patient is well appearing, alert and oriented x 3 today.   Head- normocephalic, atraumatic Eyes-  Sclera clear, conjunctiva pink Ears- hearing intact Oropharynx- clear Neck- supple, no JVP Lungs- Clear to ausculation bilaterally, normal work of breathing Heart- Regular rate and rhythm, no significant murmurs, no rubs or gallops GI- soft, NT, ND Extremities- no clubbing, cyanosis, or edema Skin- no rash or lesion Psych- euthymic mood, full affect Neuro- no gross deficits appreciated  PPM implant site is dry, no ecchymosis or hematoma, is stable  LABS: Basic Metabolic Panel:  Recent Labs  08/15/16 1225 08/16/16 0226 08/17/16 0202  NA 143 141 140  K 3.8 3.2* 4.0  CL 109 108 110  CO2 26 23 26   GLUCOSE 121* 99 98  BUN 17 13 13     CREATININE 0.84 0.77 1.02*  CALCIUM 10.4* 9.8 9.3  MG 2.1  --   --    Liver Function Tests:  Recent Labs  08/17/16 0202  AST 22  ALT 16  ALKPHOS 52  BILITOT 1.0  PROT 5.2*  ALBUMIN 3.5   No results for input(s): LIPASE, AMYLASE in the last 72 hours. CBC:  Recent Labs  08/15/16 1225 08/16/16 0226 08/17/16 0202  WBC 8.2 7.6 8.3  NEUTROABS 5.6  --   --   HGB 13.2 11.8* 12.4  HCT 40.8 36.2 39.3  MCV 92.5 92.1 93.3  PLT 218 215 209   Thyroid Function Tests:  Recent Labs  08/15/16 1225  TSH 4.876*  T3FREE 2.1     RADIOLOGY: CXR reveals stable leads, tiny left pneumothorax   ASSESSMENT AND PLAN:   1. Fatigue, DOE, high degree AVBlock with 2:1, high degree AVblock      now s/p PPM implant yesterday with dr. Rayann Heman     Site is stable     Wound  care and activity restrictions discussed with the patient     Device check this morning with normal/intact function     CXR reviewed by Dr. Rayann Heman, stable lead position, official read is pending     Post pacer follow up has been arranged  2. HTN     stable, due for diltiazem to resume todayfurther deferred to primary cardiology  Tommye Standard, PA-C 08/17/2016 8:18 AM  I have seen, examined the patient, and reviewed the above assessment and plan.  Changes to above are made where necessary.  Tiny L pneumothorax is noted without clinical sequelae.  This is not surprising given her advanced age and fragility.  Lungs are clear on exam and she is asymptomatic.  No indication for intervention. I will keep in hospital and repeat CXR tomorrow.  Device interrogation is reviewed and normal.  Co Sign: Thompson Grayer, MD 08/17/2016 9:14 AM

## 2016-08-18 ENCOUNTER — Other Ambulatory Visit: Payer: Self-pay | Admitting: Physician Assistant

## 2016-08-18 ENCOUNTER — Inpatient Hospital Stay (HOSPITAL_COMMUNITY): Payer: Medicare Other

## 2016-08-18 DIAGNOSIS — J9383 Other pneumothorax: Secondary | ICD-10-CM

## 2016-08-18 NOTE — Progress Notes (Signed)
Patient ambulated on room air with saturations at 94%-97%.

## 2016-08-18 NOTE — Discharge Summary (Signed)
Physician Discharge Summary  MERLEAN PIZZINI MRN: [id number] DOB/AGE: [birth year] 80 y.o.  PCP: Hollace Kinnier, DO   Admit date: 08/15/2016 Discharge date: 08/18/2016  Discharge Diagnoses:    Principal Problem:   Bradycardia Active Problems:   HLD (hyperlipidemia)   COPD (chronic obstructive pulmonary disease) (HCC)   Anxiety state   HTN (hypertension)   Gastroesophageal reflux disease without esophagitis   DDD (degenerative disc disease), lumbar   Hypothyroidism due to acquired atrophy of thyroid   General weakness   AV block, 2nd degree   Second degree Mobitz II AV block    Follow-up recommendations Follow-up with PCP in 3-5 days , including all  additional recommended appointments as below Follow-up CBC, CMP in 3-5 days Follow-up with cardiology as scheduled       Current Discharge Medication List    CONTINUE these medications which have NOT CHANGED   Details  Calcium-Phosphorus-Vitamin D (CITRACAL +D3) 250-107-500 MG-MG-UNIT CHEW Chew 1 tablet by mouth daily.    denosumab (PROLIA) 60 MG/ML SOLN injection Inject 60 mg into the skin every 6 (six) months. Administer in upper arm, thigh, or abdomen    diltiazem (CARDIZEM CD) 240 MG 24 hr capsule Take 240 mg by mouth daily.     dorzolamide-timolol (COSOPT) 22.3-6.8 MG/ML ophthalmic solution Place 1 drop into both eyes 2 (two) times daily. Refills: 5    ipratropium (ATROVENT) 0.06 % nasal spray Place 2 sprays into both nostrils 3 (three) times daily.    irbesartan-hydrochlorothiazide (AVALIDE) 300-12.5 MG tablet Take 1 tablet by mouth daily. Qty: 30 tablet, Refills: 6    LACTASE PO Take 1 tablet by mouth as needed (when eating dairy products).    Multiple Vitamins-Minerals (MULTIVITAMIN WITH MINERALS) tablet Take 1 tablet by mouth daily.    rosuvastatin (CRESTOR) 10 MG tablet Take 5 mg by mouth at bedtime.     SYNTHROID 125 MCG tablet TAKE 1 TABLET BY MOUTH EVERY DAY Qty: 30 tablet, Refills: 3      STOP  taking these medications     naproxen sodium (ANAPROX) 220 MG tablet      pantoprazole (PROTONIX) 40 MG tablet          Discharge Condition: Stable   Discharge Instructions Get Medicines reviewed and adjusted: Please take all your medications with you for your next visit with your Primary MD  Please request your Primary MD to go over all hospital tests and procedure/radiological results at the follow up, please ask your Primary MD to get all Hospital records sent to his/her office.  If you experience worsening of your admission symptoms, develop shortness of breath, life threatening emergency, suicidal or homicidal thoughts you must seek medical attention immediately by calling 911 or calling your MD immediately if symptoms less severe.  You must read complete instructions/literature along with all the possible adverse reactions/side effects for all the Medicines you take and that have been prescribed to you. Take any new Medicines after you have completely understood and accpet all the possible adverse reactions/side effects.   Do not drive when taking Pain medications.   Do not take more than prescribed Pain, Sleep and Anxiety Medications  Special Instructions: If you have smoked or chewed Tobacco in the last 2 yrs please stop smoking, stop any regular Alcohol and or any Recreational drug use.  Wear Seat belts while driving.  Please note  You were cared for by a hospitalist during your hospital stay. Once you are discharged, your primary care physician will handle any  further medical issues. Please note that NO REFILLS for any discharge medications will be authorized once you are discharged, as it is imperative that you return to your primary care physician (or establish a relationship with a primary care physician if you do not have one) for your aftercare needs so that they can reassess your need for medications and monitor your lab values.     Allergies  Allergen  Reactions  . Codeine Nausea Only  . Macrobid [Nitrofurantoin] Diarrhea  . Miralax [Polyethylene Glycol] Other (See Comments)    cramping  . Pain Relief [Acetaminophen] Nausea And Vomiting  . Paxil [Paroxetine Hcl] Nausea Only  . Percodan [Oxycodone-Aspirin] Itching  . Oxycodone Rash  . Oxycodone-Acetaminophen Rash  . Penicillins Rash    Has patient had a PCN reaction causing immediate rash, facial/tongue/throat swelling, SOB or lightheadedness with hypotension: YES Has patient had a PCN reaction causing severe rash involving mucus membranes or skin necrosis: NO Has patient had a PCN reaction that required hospitalization NO Has patient had a PCN reaction occurring within the last 10 years: NO If all of the above answers are "NO", then may proceed with Cephalosporin use.      Disposition: 01-Home or Self Care   Consults: * Cardiology EP     Significant Diagnostic Studies:  Dg Chest 2 View  Result Date: 08/18/2016 CLINICAL DATA:  Pneumothorax . EXAM: CHEST  2 VIEW COMPARISON:  12/20/ 2017. FINDINGS: Cardiac pacer with lead tips over the right atrium right ventricle. Heart size normal. No focal infiltrate. Small bilateral pleural effusions noted. Tiny left apical pneumothorax noted, unchanged from prior exam. Thoracolumbar spine degenerative change noted. IMPRESSION: 1. Cardiac pacer with lead tips over the right atrium and right ventricle. 2. Small bilateral pleural effusions. Tiny left apical pneumothorax noted. This is unchanged prior exam. Electronically Signed   By: Marcello Moores  Register   On: 08/18/2016 07:44   Dg Chest 2 View  Result Date: 08/17/2016 CLINICAL DATA:  Recent pacemaker placement EXAM: CHEST  2 VIEW COMPARISON:  08/15/2016 FINDINGS: Cardiac shadow is within normal limits. Pacing device is now seen. A small left apical pneumothorax is noted. The lungs are otherwise hyperinflated without focal infiltrate. No acute bony abnormality is noted. IMPRESSION: Small left  apical pneumothorax with less than 1 cm excursion. These results will be called to the ordering clinician or representative by the Radiologist Assistant, and communication documented in the PACS or zVision Dashboard. Electronically Signed   By: Inez Catalina M.D.   On: 08/17/2016 08:47   Dg Chest 2 View  Result Date: 08/15/2016 CLINICAL DATA:  82 year old female with shortness of breath and weakness. Initial encounter. EXAM: CHEST  2 VIEW COMPARISON:  07/11/2015 and earlier. FINDINGS: One of the patient arms is not raised on the lateral view today. New small right pleural effusion is evident on the lateral. Stable lung volumes. A degree of chronic hyperinflation is noted. Stable cardiac size and mediastinal contours. Cardiac size at the upper limits of normal. Calcified aortic atherosclerosis. Visualized tracheal air column is within normal limits. No pneumothorax, pulmonary edema, or confluent pulmonary opacity. Osteopenia. No acute osseous abnormality identified. IMPRESSION: 1. New small right pleural effusion. 2. Chronic hyperinflation. Borderline to mild cardiomegaly. Calcified aortic atherosclerosis. Electronically Signed   By: Genevie Ann M.D.   On: 08/15/2016 13:10       Filed Weights   08/17/16 0454 08/18/16 0307 08/18/16 0438  Weight: 62.6 kg (138 lb) 62.1 kg (136 lb 14.4 oz) 62.1 kg (136  lb 14.5 oz)     Microbiology: No results found for this or any previous visit (from the past 240 hour(s)).     Blood Culture    Component Value Date/Time   SDES URINE, RANDOM 06/03/2011 0046   SPECREQUEST NONE 06/03/2011 0046   CULT  06/03/2011 0046    LACTOBACILLUS SPECIES Note: Standardized susceptibility testing for this organism is not available.   REPTSTATUS 06/04/2011 FINAL 06/03/2011 0046      Labs: Results for orders placed or performed during the hospital encounter of 08/15/16 (from the past 48 hour(s))  Comprehensive metabolic panel     Status: Abnormal   Collection Time: 08/17/16   2:02 AM  Result Value Ref Range   Sodium 140 135 - 145 mmol/L   Potassium 4.0 3.5 - 5.1 mmol/L    Comment: DELTA CHECK NOTED   Chloride 110 101 - 111 mmol/L   CO2 26 22 - 32 mmol/L   Glucose, Bld 98 65 - 99 mg/dL   BUN 13 6 - 20 mg/dL   Creatinine, Ser 1.02 (H) 0.44 - 1.00 mg/dL   Calcium 9.3 8.9 - 10.3 mg/dL   Total Protein 5.2 (L) 6.5 - 8.1 g/dL   Albumin 3.5 3.5 - 5.0 g/dL   AST 22 15 - 41 U/L   ALT 16 14 - 54 U/L   Alkaline Phosphatase 52 38 - 126 U/L   Total Bilirubin 1.0 0.3 - 1.2 mg/dL   GFR calc non Af Amer 49 (L) >60 mL/min   GFR calc Af Amer 56 (L) >60 mL/min    Comment: (NOTE) The eGFR has been calculated using the CKD EPI equation. This calculation has not been validated in all clinical situations. eGFR's persistently <60 mL/min signify possible Chronic Kidney Disease.    Anion gap 4 (L) 5 - 15  CBC     Status: None   Collection Time: 08/17/16  2:02 AM  Result Value Ref Range   WBC 8.3 4.0 - 10.5 K/uL   RBC 4.21 3.87 - 5.11 MIL/uL   Hemoglobin 12.4 12.0 - 15.0 g/dL   HCT 39.3 36.0 - 46.0 %   MCV 93.3 78.0 - 100.0 fL   MCH 29.5 26.0 - 34.0 pg   MCHC 31.6 30.0 - 36.0 g/dL   RDW 14.3 11.5 - 15.5 %   Platelets 209 150 - 400 K/uL          HPI :*  JACKLIN ZWICK  is a 82 y.o. female who is very active With history of hypertension and hyperlipidemia, followed Lia Hopping, MD, last seen 06/08/2016 for evaluation of dyspnea on exertion and palpitations.  At that time she had an echocardiogram on 09/16/2015 revealing normal LVEF and grade 1 diastolic dysfunction with mild mitral regurgitation and mild to moderate tricuspid regurgitation.  Nuclear stress test on 09/14/2015 had revealed no evidence of ischemia with normal LVEF.  She is now admitted to the hospital with marked fatigue, dyspnea ongoing for 2 weeks, bradycardia and abnormal EKG revealing underlying sinus rhythm with 2:1 AV block. No syncope, no dizziness, no chest pain. No further palpitations since being on  multivitamins. Her daughter is present the bedside  HOSPITAL COURSE:   ASSESSMENT AND PLAN:  1. Symptomatic high degree AV block, patient presented with 2:1 AV block, now on telemetry has third degree AV block with junctional escape, hemodynamically stable. Diltiazem held initially,  now s/p PPM implant yesterday with dr. Rayann Heman     Site is stable  Wound care and activity restrictions discussed with the patient     Device check  with normal/intact function     CXR reviewed by Dr. Rayann Heman, small left apical pneumothorax, stable 2 days     Post pacer follow up has been arranged Okay to discharge as per EP   #2 hypothyroidism due to acquired atrophy of thyroid. TSH 4.87. Home medications include Synthroid. -Continue Synthroid TSH 4.8 , free T4 1.31  #3. Hypertension. Poor control in the emergency department. Blood pressure on the high end of normal. Home medications include diltiazem, irbesartan and hydrochlorothiazide Cardiology resumed diltiazem   -Continue other home antihypertensives      #4. GERD. Appears stable at baseline. Continue home meds  #5. COPD. has never been a smoker. Not on home oxygen. Appears stable at baseline. Oxygen saturation level greater than 90% on room air.Chest x-ray with chronic hyperinflation -Continue home inhalers  #6. Anxiety. Appears stable at baseline -Continue home meds  #7. Hyperlipidemia -Continue statin  #8 hypokalemia-repleted , magnesium 2.1     Discharge Exam:   Blood pressure (!) 150/64, pulse 69, temperature 98 F (36.7 C), temperature source Oral, resp. rate 18, height '5\' 3"'  (1.6 m), weight 62.1 kg (136 lb 14.5 oz), SpO2 93 %. General appearance: alert, cooperative, appears stated age and no distress Lungs: clear to auscultation bilaterally Heart: regular rate and rhythm, S1, S2 normal, no murmur, click, rub or gallop and Marked bradycardia present Abdomen: soft, non-tender; bowel sounds normal; no masses,  no  organomegaly and Mildly obese Extremities: edema 1-2+ below-knee, full range of movements. No tenderness. Pulses: 2+ and symmetric Faint right carotid bruit present Neurologic: Grossly normal     Follow-up Information    [facility] Follow up on 08/26/2016.   Specialty:  Cardiology Why:  11:00AM, wound check Contact information: [address] [place]       Thompson Grayer, MD Follow up on 11/14/2016.   Specialty:  Cardiology Why:  12:15PM Contact information: [address] [place] [address] [phone]        [place], TIFFANY, DO. Schedule an appointment as soon as possible for a visit in 2 day(s).   Specialty:  Geriatric Medicine Why:  Call to make appointment with PCP hospital follow-up Contact information: [address] [place] [address] [phone]           Signed: Reyne Dumas 08/18/2016, 8:01 AM        Time spent >45 mins

## 2016-08-18 NOTE — Progress Notes (Signed)
Patient discharged teaching given including activity, diet, follow-up appointments and medication. Patient verbalized understanding of all discharge instructions. Specific instructions on pacemaker care and arm movement were given. IV access was dc'd. Vitals are stable. Skin is intact. Pt to be escorted out by NT, to be driven back to independent living by family.

## 2016-08-18 NOTE — Progress Notes (Signed)
Doing well.  Denies CP or SOB. Tele stable CXR reveals tiny residual ptx.    OK to discharge today.  Routine wound care Follow-up with me in 2-3 weeks with CXR at that time (I will arrange).   Please call me with questions  Thompson Grayer MD, [facility] 08/18/2016 8:12 AM

## 2016-08-19 ENCOUNTER — Telehealth: Payer: Self-pay

## 2016-08-19 NOTE — Telephone Encounter (Signed)
Possible re-admission to facility. This is a patient you were seeing at [facility] . [facility] F/U is needed if patient was re-admitted to facility upon discharge. Hospital discharge from [facility] on 08/18/16.

## 2016-08-21 DIAGNOSIS — R05 Cough: Secondary | ICD-10-CM | POA: Diagnosis not present

## 2016-08-21 DIAGNOSIS — J019 Acute sinusitis, unspecified: Secondary | ICD-10-CM | POA: Diagnosis not present

## 2016-08-25 ENCOUNTER — Ambulatory Visit (INDEPENDENT_AMBULATORY_CARE_PROVIDER_SITE_OTHER): Payer: Medicare Other | Admitting: Internal Medicine

## 2016-08-25 ENCOUNTER — Encounter: Payer: Self-pay | Admitting: Internal Medicine

## 2016-08-25 VITALS — BP 148/80 | HR 74 | Temp 98.6°F | Wt 137.0 lb

## 2016-08-25 DIAGNOSIS — J209 Acute bronchitis, unspecified: Secondary | ICD-10-CM | POA: Diagnosis not present

## 2016-08-25 MED ORDER — BENZONATATE 100 MG PO CAPS: 100.0000 mg | ORAL_CAPSULE | Freq: Two times a day (BID) | ORAL | 0 refills | Status: DC | PRN

## 2016-08-25 NOTE — Telephone Encounter (Signed)
Has appt to be seen next Thursday.  I will see her today for an acute visit re: persistent cough productive of discolored sputum.

## 2016-08-25 NOTE — Progress Notes (Signed)
Location:  [facility] clinic Provider: Debhora Titus L. Mariea Clonts, D.O., C.M.D.  Code Status: DNR Goals of Care:  Advanced Directives 08/15/2016  Does Patient Have a Medical Advance Directive? Yes  Type of Advance Directive [place]  Does patient want to make changes to medical advance directive? No - Patient declined  Copy of [place] in Chart? No - copy requested     Chief Complaint  Patient presents with  . Acute Visit    URI    HPI: Patient is a 82 y.o. female seen today for an acute visit for  She is worried about coughing up ropy yellow thick discharge.  She's been drinking a lot water.  She is sleeping sitting up.  She is not sure if she needs more antibiotics.  Had zpak and prednisone.  They went to urgent care Christmas Eve when fever was 101.3 and after sore throat had begun the night before.  She was treated for acute sinusitis with cough.    Past Medical History:  Diagnosis Date  . Allergy   . Arthritis   . 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 Cataract   . Chronic anxiety   . Constipation   . Diverticulosis of sigmoid colon   . Emphysema of lung ([place])   . GERD (gastroesophageal reflux disease)   . Glaucoma   . H/O adenomatous polyp of colon    Dr. Cristina Gong  . High cholesterol   . Hypertension   . Hypothyroidism   . Insomnia   . Lactose intolerance   . LBBB (left bundle branch block)   . Lumbar degenerative disc disease   . Onychomycosis   . Osteoarthritis   . Osteoporosis   . Palpitations   . Spinal stenosis   . Thyroid disease   . Varicose veins     Past Surgical History:  Procedure Laterality Date  . ABDOMINAL HYSTERECTOMY    . BLADDER REPAIR    . blephoplasty bilateral  03/10/2014  . CATARACT EXTRACTION W/ INTRAOCULAR LENS  IMPLANT, BILATERAL  7 & 8 2014  . CHOLECYSTECTOMY    . COLONOSCOPY  05/18/2012  . EP IMPLANTABLE DEVICE N/A 08/16/2016   Procedure: Pacemaker  Implant;  Surgeon: Thompson Grayer, MD;  Location: [place] [facility];  Service: Cardiovascular;  Laterality: N/A;  . EYE SURGERY    . herniated disc repair    . INCONTINENCE SURGERY    . JOINT REPLACEMENT    . LAMINECTOMY  02/2011   L3-L5  Dr. Ellene Route  . LUMBAR LAMINECTOMY/DECOMPRESSION MICRODISCECTOMY Right 09/21/2015   Procedure: Right Lumbar Three-FourMicrodiskectomy;  Surgeon: Kristeen Miss, MD;  Location: [place] [facility];  Service: Neurosurgery;  Laterality: Right;  Right L3-4 Microdiskectomy  . SHOULDER ARTHROSCOPY WITH ROTATOR CUFF REPAIR Right 08/2003  . TONSILLECTOMY    . TOTAL HIP ARTHROPLASTY     right 1999    Allergies  Allergen Reactions  . Codeine Nausea Only  . Macrobid [Nitrofurantoin] Diarrhea  . Miralax [Polyethylene Glycol] Other (See Comments)    cramping  . Pain Relief [Acetaminophen] Nausea And Vomiting  . Paxil [Paroxetine Hcl] Nausea Only  . Percodan [Oxycodone-Aspirin] Itching  . Oxycodone Rash  . Oxycodone-Acetaminophen Rash  . Penicillins Rash    Has patient had a PCN reaction causing immediate rash, facial/tongue/throat swelling, SOB or lightheadedness with hypotension: YES Has patient had a PCN reaction causing severe rash involving mucus membranes or skin necrosis: NO Has  patient had a PCN reaction that required hospitalization NO Has patient had a PCN reaction occurring within the last 10 years: NO If all of the above answers are "NO", then may proceed with Cephalosporin use.    Allergies as of 08/25/2016      Reactions   Codeine Nausea Only   Macrobid [nitrofurantoin] Diarrhea   Miralax [polyethylene Glycol] Other (See Comments)   cramping   Pain Relief [acetaminophen] Nausea And Vomiting   Paxil [paroxetine Hcl] Nausea Only   Percodan [oxycodone-aspirin] Itching   Oxycodone Rash   Oxycodone-acetaminophen Rash   Penicillins Rash   Has patient had a PCN reaction causing immediate rash, facial/tongue/throat swelling, SOB or lightheadedness with  hypotension: YES Has patient had a PCN reaction causing severe rash involving mucus membranes or skin necrosis: NO Has patient had a PCN reaction that required hospitalization NO Has patient had a PCN reaction occurring within the last 10 years: NO If all of the above answers are "NO", then may proceed with Cephalosporin use.      Medication List       Accurate as of 08/25/16  4:06 PM. Always use your most recent med list.          CITRACAL +D3 250-107-500 MG-MG-UNIT Chew Generic drug:  Calcium-Phosphorus-Vitamin D Chew 1 tablet by mouth daily.   diltiazem 240 MG 24 hr capsule Commonly known as:  CARDIZEM CD Take 240 mg by mouth daily.   dorzolamide-timolol 22.3-6.8 MG/ML ophthalmic solution Commonly known as:  COSOPT Place 1 drop into both eyes 2 (two) times daily.   ipratropium 0.06 % nasal spray Commonly known as:  ATROVENT Place 2 sprays into both nostrils 3 (three) times daily.   irbesartan-hydrochlorothiazide 300-12.5 MG tablet Commonly known as:  AVALIDE Take 1 tablet by mouth daily.   LACTASE PO Take 1 tablet by mouth as needed (when eating dairy products).   multivitamin with minerals tablet Take 1 tablet by mouth daily.   PROLIA 60 MG/ML Soln injection Generic drug:  denosumab Inject 60 mg into the skin every 6 (six) months. Administer in upper arm, thigh, or abdomen   rosuvastatin 10 MG tablet Commonly known as:  CRESTOR Take 5 mg by mouth at bedtime.   SYNTHROID 125 MCG tablet Generic drug:  levothyroxine TAKE 1 TABLET BY MOUTH EVERY DAY       Review of Systems:  Review of Systems  Constitutional: Negative for chills, fever and malaise/fatigue.  HENT: Negative for congestion, hearing loss, sinus pain and sore throat.   Eyes: Negative for blurred vision.  Respiratory: Positive for cough and sputum production. Negative for hemoptysis, shortness of breath and wheezing.   Cardiovascular: Negative for chest pain and palpitations.       Sore at  pacemaker site, no drainage or swelling  Gastrointestinal: Negative for diarrhea, nausea and vomiting.  Genitourinary: Negative for dysuria.  Musculoskeletal: Negative for falls.  Skin: Negative for itching and rash.  Neurological: Negative for dizziness and weakness.    Health Maintenance  Topic Date Due  . TETANUS/TDAP  08/20/1947  . PNA vac Low Risk Adult (2 of 2 - PCV13) 08/29/2014  . INFLUENZA VACCINE  Completed  . DEXA SCAN  Completed  . ZOSTAVAX  Completed    Physical Exam: Vitals:   08/25/16 1551  BP: (!) 148/80  Pulse: 74  Temp: 98.6 F (37 C)  TempSrc: Oral  SpO2: 97%  Weight: 137 lb (62.1 kg)   Body mass index is 24.27 kg/m. Physical Exam  Constitutional: She is oriented to person, place, and time. She appears well-developed and well-nourished. No distress.  HENT:  Mouth/Throat: Oropharynx is clear and moist. No oropharyngeal exudate.  Neck: Neck supple.  Cardiovascular: Normal rate, regular rhythm, normal heart sounds and intact distal pulses.   Pulmonary/Chest: Effort normal and breath sounds normal. No respiratory distress. She has no wheezes. She has no rales.  Musculoskeletal: Normal range of motion.  Neurological: She is alert and oriented to person, place, and time.  Skin: Skin is warm and dry.  Pacemaker site clean and dry, steristrips all in place  Psychiatric: She has a normal mood and affect.    Labs reviewed: Basic Metabolic Panel:  Recent Labs  09/02/15  03/07/16 1146  08/15/16 1225 08/16/16 0226 08/17/16 0202  NA 141  < >  --   < > 143 141 140  K 4.1  < >  --   < > 3.8 3.2* 4.0  CL  --   < >  --   < > 109 108 110  CO2  --   < >  --   < > 26 23 26   GLUCOSE  --   < >  --   < > 121* 99 98  BUN 16  < >  --   < > 17 13 13   CREATININE 0.6  < >  --   < > 0.84 0.77 1.02*  CALCIUM  --   < >  --   < > 10.4* 9.8 9.3  MG  --   --   --   --  2.1  --   --   TSH 0.70  --  0.48  --  4.876*  --   --   < > = values in this interval not  displayed. Liver Function Tests:  Recent Labs  09/02/15 05/12/16 1345 08/17/16 0202  AST 14 18 22   ALT 16 11 16   ALKPHOS  --  59 52  BILITOT  --  0.6 1.0  PROT  --  6.1 5.2*  ALBUMIN  --  4.3 3.5   No results for input(s): LIPASE, AMYLASE in the last 8760 hours. No results for input(s): AMMONIA in the last 8760 hours. CBC:  Recent Labs  05/12/16 1345 08/15/16 1225 08/16/16 0226 08/17/16 0202  WBC 6.8 8.2 7.6 8.3  NEUTROABS 4,216 5.6  --   --   HGB 12.5 13.2 11.8* 12.4  HCT 38.5 40.8 36.2 39.3  MCV 91.0 92.5 92.1 93.3  PLT 265 218 215 209   Lipid Panel:  Recent Labs  09/02/15  CHOL 175  HDL 71*  LDLCALC 79  TRIG 126   No results found for: HGBA1C  Procedures since last visit: Dg Chest 2 View  Result Date: 08/18/2016 CLINICAL DATA:  Pneumothorax . EXAM: CHEST  2 VIEW COMPARISON:  12/20/ 2017. FINDINGS: Cardiac pacer with lead tips over the right atrium right ventricle. Heart size normal. No focal infiltrate. Small bilateral pleural effusions noted. Tiny left apical pneumothorax noted, unchanged from prior exam. Thoracolumbar spine degenerative change noted. IMPRESSION: 1. Cardiac pacer with lead tips over the right atrium and right ventricle. 2. Small bilateral pleural effusions. Tiny left apical pneumothorax noted. This is unchanged prior exam. Electronically Signed   By: Marcello Moores  Register   On: 08/18/2016 07:44   Dg Chest 2 View  Result Date: 08/17/2016 CLINICAL DATA:  Recent pacemaker placement EXAM: CHEST  2 VIEW COMPARISON:  08/15/2016 FINDINGS: Cardiac shadow is within normal limits.  Pacing device is now seen. A small left apical pneumothorax is noted. The lungs are otherwise hyperinflated without focal infiltrate. No acute bony abnormality is noted. IMPRESSION: Small left apical pneumothorax with less than 1 cm excursion. These results will be called to the ordering clinician or representative by the Radiologist Assistant, and communication documented in the  PACS or zVision Dashboard. Electronically Signed   By: Inez Catalina M.D.   On: 08/17/2016 08:47   Dg Chest 2 View  Result Date: 08/15/2016 CLINICAL DATA:  82 year old female with shortness of breath and weakness. Initial encounter. EXAM: CHEST  2 VIEW COMPARISON:  07/11/2015 and earlier. FINDINGS: One of the patient arms is not raised on the lateral view today. New small right pleural effusion is evident on the lateral. Stable lung volumes. A degree of chronic hyperinflation is noted. Stable cardiac size and mediastinal contours. Cardiac size at the upper limits of normal. Calcified aortic atherosclerosis. Visualized tracheal air column is within normal limits. No pneumothorax, pulmonary edema, or confluent pulmonary opacity. Osteopenia. No acute osseous abnormality identified. IMPRESSION: 1. New small right pleural effusion. 2. Chronic hyperinflation. Borderline to mild cardiomegaly. Calcified aortic atherosclerosis. Electronically Signed   By: Genevie Ann M.D.   On: 08/15/2016 13:10    Assessment/Plan 1. Acute bronchitis, unspecified organism - seems nearly resolved -reports feeling better than when appt was made - still coughing up some thick sputum and concerned she'll rip open her pacemaker site (which looks good) - renewed tessalon perles  - benzonatate (TESSALON PERLES) 100 MG capsule; Take 1 capsule (100 mg total) by mouth 2 (two) times daily as needed for cough.  Dispense: 20 capsule; Refill: 0 -has finished zpak -encouraged conservative therapy with warm humidity in shower since she can begin showers tomorrow, hydration, rest, incentive spirometry if she can get one [facility] -I will see her next week for her hospital f/u from her pacemaker insertion to see if she is entirely better -no need for more abx at this time  Labs/tests ordered:  Incentive spirometry 10 breaths tid, more tessalon perles  Next appt:  09/01/2016  Crystallee Werden L. Caedan Sumler, D.O. [facility] [address], [place] [address] Cell Phone (Mon-Fri 8am-5pm):  [phone] On Call:  [phone] & follow prompts after 5pm & weekends Office Phone:  [phone] Office Fax:  [phone]

## 2016-08-25 NOTE — Patient Instructions (Signed)
Obtain an incentive spirometer and take 10 breaths three times a day while you still have your cough and congestion Use warm humidity in the shower to loosen up the mucus.

## 2016-08-26 ENCOUNTER — Ambulatory Visit: Payer: Medicare Other

## 2016-09-01 ENCOUNTER — Encounter: Payer: Self-pay | Admitting: Internal Medicine

## 2016-09-01 ENCOUNTER — Ambulatory Visit (INDEPENDENT_AMBULATORY_CARE_PROVIDER_SITE_OTHER): Payer: Medicare Other | Admitting: Internal Medicine

## 2016-09-01 VITALS — BP 132/74 | HR 71 | Temp 98.0°F | Ht 63.0 in | Wt 138.0 lb

## 2016-09-01 DIAGNOSIS — J209 Acute bronchitis, unspecified: Secondary | ICD-10-CM

## 2016-09-01 DIAGNOSIS — E860 Dehydration: Secondary | ICD-10-CM | POA: Diagnosis not present

## 2016-09-01 DIAGNOSIS — I441 Atrioventricular block, second degree: Secondary | ICD-10-CM

## 2016-09-01 DIAGNOSIS — J939 Pneumothorax, unspecified: Secondary | ICD-10-CM | POA: Diagnosis not present

## 2016-09-01 DIAGNOSIS — Z950 Presence of cardiac pacemaker: Secondary | ICD-10-CM | POA: Diagnosis not present

## 2016-09-01 NOTE — Progress Notes (Signed)
Location:  [facility] clinic Provider: Zinedine Ellner L. Mariea Clonts, D.O., C.M.D.  Code Status: DNR Goals of Care:  Advanced Directives 09/01/2016  Does Patient Have a Medical Advance Directive? Yes  Type of Advance Directive [place]  Does patient want to make changes to medical advance directive? -  Copy of [place] in Chart? Yes   Chief Complaint  Patient presents with  . Hospitalization Follow-up    TOC 08/15/16 to 08/18/16 for brachycardia    HPI: Patient is a 83 y.o. female seen today for hospital follow-up s/p admission from 12/18-21 for bradycardia.  She had a small pleural effusion on 12/18 CXR.  She wound up having a pacemaker placed for her bradycardia.  She then developed a left apical pneumothorax.       She walked in today.  Her cough is much better.  She's been pretty sedentary in the cold weather. She is going to get back to walking.    She has a f/u chest xray ordered and plans to go tomorrow for it prior to seeing Dr. Rayann Heman.  Past Medical History:  Diagnosis Date  . Allergy   . Arthritis   . 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 Cataract   . Chronic anxiety   . Constipation   . Diverticulosis of sigmoid colon   . Emphysema of lung ([place])   . GERD (gastroesophageal reflux disease)   . Glaucoma   . H/O adenomatous polyp of colon    Dr. Cristina Gong  . High cholesterol   . Hypertension   . Hypothyroidism   . Insomnia   . Lactose intolerance   . LBBB (left bundle branch block)   . Lumbar degenerative disc disease   . Onychomycosis   . Osteoarthritis   . Osteoporosis   . Palpitations   . Spinal stenosis   . Thyroid disease   . Varicose veins     Past Surgical History:  Procedure Laterality Date  . ABDOMINAL HYSTERECTOMY    . BLADDER REPAIR    . blephoplasty bilateral  03/10/2014  . CATARACT EXTRACTION W/ INTRAOCULAR LENS  IMPLANT, BILATERAL  7 & 8 2014  . CHOLECYSTECTOMY      . COLONOSCOPY  05/18/2012  . EP IMPLANTABLE DEVICE N/A 08/16/2016   Procedure: Pacemaker Implant;  Surgeon: Thompson Grayer, MD;  Location: [place] [facility];  Service: Cardiovascular;  Laterality: N/A;  . EYE SURGERY    . herniated disc repair    . INCONTINENCE SURGERY    . JOINT REPLACEMENT    . LAMINECTOMY  02/2011   L3-L5  Dr. Ellene Route  . LUMBAR LAMINECTOMY/DECOMPRESSION MICRODISCECTOMY Right 09/21/2015   Procedure: Right Lumbar Three-FourMicrodiskectomy;  Surgeon: Kristeen Miss, MD;  Location: [place] [facility];  Service: Neurosurgery;  Laterality: Right;  Right L3-4 Microdiskectomy  . SHOULDER ARTHROSCOPY WITH ROTATOR CUFF REPAIR Right 08/2003  . TONSILLECTOMY    . TOTAL HIP ARTHROPLASTY     right 1999    Allergies  Allergen Reactions  . Codeine Nausea Only  . Macrobid [Nitrofurantoin] Diarrhea  . Miralax [Polyethylene Glycol] Other (See Comments)    cramping  . Pain Relief [Acetaminophen] Nausea And Vomiting  . Paxil [Paroxetine Hcl] Nausea Only  . Percodan [Oxycodone-Aspirin] Itching  . Oxycodone Rash  . Oxycodone-Acetaminophen Rash  . Penicillins Rash    Has patient had a PCN reaction causing immediate rash, facial/tongue/throat swelling, SOB or lightheadedness with hypotension: YES Has patient  had a PCN reaction causing severe rash involving mucus membranes or skin necrosis: NO Has patient had a PCN reaction that required hospitalization NO Has patient had a PCN reaction occurring within the last 10 years: NO If all of the above answers are "NO", then may proceed with Cephalosporin use.    Allergies as of 09/01/2016      Reactions   Codeine Nausea Only   Macrobid [nitrofurantoin] Diarrhea   Miralax [polyethylene Glycol] Other (See Comments)   cramping   Pain Relief [acetaminophen] Nausea And Vomiting   Paxil [paroxetine Hcl] Nausea Only   Percodan [oxycodone-aspirin] Itching   Oxycodone Rash   Oxycodone-acetaminophen Rash   Penicillins Rash   Has patient had a PCN reaction  causing immediate rash, facial/tongue/throat swelling, SOB or lightheadedness with hypotension: YES Has patient had a PCN reaction causing severe rash involving mucus membranes or skin necrosis: NO Has patient had a PCN reaction that required hospitalization NO Has patient had a PCN reaction occurring within the last 10 years: NO If all of the above answers are "NO", then may proceed with Cephalosporin use.      Medication List       Accurate as of 09/01/16  3:35 PM. Always use your most recent med list.          benzonatate 100 MG capsule Commonly known as:  TESSALON PERLES Take 1 capsule (100 mg total) by mouth 2 (two) times daily as needed for cough.   CITRACAL +D3 250-107-500 MG-MG-UNIT Chew Generic drug:  Calcium-Phosphorus-Vitamin D Chew 1 tablet by mouth daily.   diltiazem 240 MG 24 hr capsule Commonly known as:  CARDIZEM CD Take 240 mg by mouth daily.   dorzolamide-timolol 22.3-6.8 MG/ML ophthalmic solution Commonly known as:  COSOPT Place 1 drop into both eyes 2 (two) times daily.   ipratropium 0.06 % nasal spray Commonly known as:  ATROVENT Place 2 sprays into both nostrils 3 (three) times daily.   irbesartan-hydrochlorothiazide 300-12.5 MG tablet Commonly known as:  AVALIDE Take 1 tablet by mouth daily.   LACTASE PO Take 1 tablet by mouth as needed (when eating dairy products).   multivitamin with minerals tablet Take 1 tablet by mouth daily.   PROLIA 60 MG/ML Soln injection Generic drug:  denosumab Inject 60 mg into the skin every 6 (six) months. Administer in upper arm, thigh, or abdomen   rosuvastatin 10 MG tablet Commonly known as:  CRESTOR Take 5 mg by mouth at bedtime.   SYNTHROID 125 MCG tablet Generic drug:  levothyroxine TAKE 1 TABLET BY MOUTH EVERY DAY       Review of Systems:  Review of Systems  Constitutional: Negative for chills and fever.  HENT: Negative for congestion.   Respiratory: Negative for cough, sputum production,  shortness of breath and wheezing.   Cardiovascular: Negative for chest pain, palpitations and leg swelling.  Gastrointestinal: Negative for abdominal pain, blood in stool, constipation, diarrhea and melena.  Genitourinary: Negative for dysuria.  Musculoskeletal: Negative for back pain and falls.  Skin:       Pacemaker site clean and dry--superior aspect not healed so steristrips still on it  Neurological: Negative for dizziness and loss of consciousness.  Psychiatric/Behavioral: Negative for depression and memory loss.    Health Maintenance  Topic Date Due  . TETANUS/TDAP  08/20/1947  . PNA vac Low Risk Adult (2 of 2 - PCV13) 08/29/2014  . INFLUENZA VACCINE  Completed  . DEXA SCAN  Completed  . ZOSTAVAX  Completed  Physical Exam: Vitals:   09/01/16 1531  BP: 132/74  Pulse: 71  Temp: 98 F (36.7 C)  TempSrc: Oral  SpO2: 96%  Weight: 138 lb (62.6 kg)  Height: 5\' 3"  (1.6 m)   Body mass index is 24.45 kg/m. Physical Exam  Constitutional: She is oriented to person, place, and time. She appears well-developed and well-nourished. No distress.  Cardiovascular: Normal rate, regular rhythm, normal heart sounds and intact distal pulses.   Pulmonary/Chest: Effort normal and breath sounds normal. No respiratory distress.  Musculoskeletal: Normal range of motion.  Neurological: She is alert and oriented to person, place, and time.  Skin: Skin is warm and dry.  Pacemaker site clean and dry--superior aspect not healed so steristrips still on it, it's currently covered with a clear dressing   Psychiatric: She has a normal mood and affect.    Labs reviewed: Basic Metabolic Panel:  Recent Labs  03/07/16 1146  08/15/16 1225 08/16/16 0226 08/17/16 0202  NA  --   < > 143 141 140  K  --   < > 3.8 3.2* 4.0  CL  --   < > 109 108 110  CO2  --   < > 26 23 26   GLUCOSE  --   < > 121* 99 98  BUN  --   < > 17 13 13   CREATININE  --   < > 0.84 0.77 1.02*  CALCIUM  --   < > 10.4* 9.8  9.3  MG  --   --  2.1  --   --   TSH 0.48  --  4.876*  --   --   < > = values in this interval not displayed. Liver Function Tests:  Recent Labs  05/12/16 1345 08/17/16 0202  AST 18 22  ALT 11 16  ALKPHOS 59 52  BILITOT 0.6 1.0  PROT 6.1 5.2*  ALBUMIN 4.3 3.5   No results for input(s): LIPASE, AMYLASE in the last 8760 hours. No results for input(s): AMMONIA in the last 8760 hours. CBC:  Recent Labs  05/12/16 1345 08/15/16 1225 08/16/16 0226 08/17/16 0202  WBC 6.8 8.2 7.6 8.3  NEUTROABS 4,216 5.6  --   --   HGB 12.5 13.2 11.8* 12.4  HCT 38.5 40.8 36.2 39.3  MCV 91.0 92.5 92.1 93.3  PLT 265 218 215 209   Lipid Panel: No results for input(s): CHOL, HDL, LDLCALC, TRIG, CHOLHDL, LDLDIRECT in the last 8760 hours. No results found for: HGBA1C  Procedures since last visit: Dg Chest 2 View  Result Date: 08/15/2016 CLINICAL DATA:  83 year old female with shortness of breath and weakness. Initial encounter. EXAM: CHEST  2 VIEW COMPARISON:  07/11/2015 and earlier. FINDINGS: One of the patient arms is not raised on the lateral view today. New small right pleural effusion is evident on the lateral. Stable lung volumes. A degree of chronic hyperinflation is noted. Stable cardiac size and mediastinal contours. Cardiac size at the upper limits of normal. Calcified aortic atherosclerosis. Visualized tracheal air column is within normal limits. No pneumothorax, pulmonary edema, or confluent pulmonary opacity. Osteopenia. No acute osseous abnormality identified. IMPRESSION: 1. New small right pleural effusion. 2. Chronic hyperinflation. Borderline to mild cardiomegaly. Calcified aortic atherosclerosis. Electronically Signed   By: Genevie Ann M.D.   On: 08/15/2016 13:10    Assessment/Plan 1. Second degree Mobitz II AV block -s/p pacemaker -still a bit fatigued, but improving quickly  2. Pneumothorax on left -is getting f/u cxr tomorrow before f/u  with Dr. Rayann Heman -is to get incentive  spirometer from [facility] independent living nurse--verbal order given  3. S/P cardiac pacemaker procedure -keep f/u appt with Dr. Darrol Angel well  4. Dehydration - had elevated creatinine during her hospitalization and some hypokalemia before that so will f/u bmp before I see her next on 0000000 - Basic metabolic panel; Future  5. Acute bronchitis, unspecified organism -resolved after completing abx course, feels much better  Labs/tests ordered:   Orders Placed This Encounter  Procedures  . Basic metabolic panel    Standing Status:   Future    Standing Expiration Date:   08/01/2018    Order Specific Question:   Has the patient fasted?    Answer:   No   Next appt:  09/14/2016, bmp before on Monday here 1/15  Lorie Cleckley L. Marra Fraga, D.O. [facility] [address], [place] [address] Cell Phone (Mon-Fri 8am-5pm):  [phone] On Call:  [phone] & follow prompts after 5pm & weekends Office Phone:  [phone] Office Fax:  [phone]

## 2016-09-02 ENCOUNTER — Ambulatory Visit (HOSPITAL_COMMUNITY)
Admission: RE | Admit: 2016-09-02 | Discharge: 2016-09-02 | Disposition: A | Discharge status: Home / Self Care | Payer: Medicare Other | Source: Ambulatory Visit | Attending: Physician Assistant | Admitting: Physician Assistant

## 2016-09-02 DIAGNOSIS — J939 Pneumothorax, unspecified: Secondary | ICD-10-CM | POA: Diagnosis not present

## 2016-09-02 DIAGNOSIS — Z45018 Encounter for adjustment and management of other part of cardiac pacemaker: Secondary | ICD-10-CM | POA: Diagnosis not present

## 2016-09-02 DIAGNOSIS — J9383 Other pneumothorax: Secondary | ICD-10-CM | POA: Diagnosis not present

## 2016-09-02 DIAGNOSIS — I700 Atherosclerosis of aorta: Secondary | ICD-10-CM | POA: Insufficient documentation

## 2016-09-02 DIAGNOSIS — J449 Chronic obstructive pulmonary disease, unspecified: Secondary | ICD-10-CM | POA: Diagnosis not present

## 2016-09-05 ENCOUNTER — Encounter: Payer: Self-pay | Admitting: Internal Medicine

## 2016-09-05 ENCOUNTER — Ambulatory Visit (INDEPENDENT_AMBULATORY_CARE_PROVIDER_SITE_OTHER): Payer: Medicare Other | Admitting: Internal Medicine

## 2016-09-05 VITALS — BP 162/72 | HR 81 | Ht 63.0 in | Wt 137.6 lb

## 2016-09-05 DIAGNOSIS — R001 Bradycardia, unspecified: Secondary | ICD-10-CM | POA: Diagnosis not present

## 2016-09-05 DIAGNOSIS — I441 Atrioventricular block, second degree: Secondary | ICD-10-CM

## 2016-09-05 LAB — CUP PACEART INCLINIC DEVICE CHECK
Battery Remaining Longevity: 139 mo
Brady Statistic AP VP Percent: 1.28 %
Brady Statistic AS VP Percent: 98.6 %
Brady Statistic RA Percent Paced: 1.27 %
Brady Statistic RV Percent Paced: 99.55 %
Date Time Interrogation Session: 20180108150841
Implantable Lead Implant Date: 20171219
Implantable Lead Location: 753859
Implantable Lead Location: 753860
Implantable Lead Model: 5076
Implantable Lead Model: 5076
Implantable Pulse Generator Implant Date: 20171219
Lead Channel Impedance Value: 361 Ohm
Lead Channel Pacing Threshold Amplitude: 0.75 V
Lead Channel Pacing Threshold Pulse Width: 0.4 ms
Lead Channel Sensing Intrinsic Amplitude: 7.125 mV
Lead Channel Setting Pacing Amplitude: 3.5 V
Lead Channel Setting Pacing Pulse Width: 0.8 ms
MDC IDC LEAD IMPLANT DT: 20171219
MDC IDC MSMT BATTERY VOLTAGE: 3.1 V
MDC IDC MSMT LEADCHNL RA IMPEDANCE VALUE: 304 Ohm
MDC IDC MSMT LEADCHNL RA IMPEDANCE VALUE: 380 Ohm
MDC IDC MSMT LEADCHNL RA SENSING INTR AMPL: 4.375 mV
MDC IDC MSMT LEADCHNL RA SENSING INTR AMPL: 4.625 mV
MDC IDC MSMT LEADCHNL RV IMPEDANCE VALUE: 437 Ohm
MDC IDC MSMT LEADCHNL RV PACING THRESHOLD AMPLITUDE: 1.25 V
MDC IDC MSMT LEADCHNL RV PACING THRESHOLD PULSEWIDTH: 0.4 ms
MDC IDC MSMT LEADCHNL RV SENSING INTR AMPL: 11.125 mV
MDC IDC SET LEADCHNL RV PACING AMPLITUDE: 3.5 V
MDC IDC SET LEADCHNL RV SENSING SENSITIVITY: 2.8 mV
MDC IDC STAT BRADY AP VS PERCENT: 0 %
MDC IDC STAT BRADY AS VS PERCENT: 0.11 %

## 2016-09-05 NOTE — Progress Notes (Signed)
PCP: Hollace Kinnier, DO  Destiny [name] is a 83 y.o. female who presents today for routine electrophysiology followup.  Since her recent PPM implant, the patient reports doing very well. Her energy is slowly improving. Today, she denies symptoms of palpitations, chest pain, shortness of breath,  lower extremity edema, dizziness, presyncope, or syncope.  The patient is otherwise without complaint today.   Past Medical History:  Diagnosis Date  . Allergy   . Arthritis   . 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 Cataract   . Chronic anxiety   . Constipation   . Diverticulosis of sigmoid colon   . Emphysema of lung ([place])   . GERD (gastroesophageal reflux disease)   . Glaucoma   . H/O adenomatous polyp of colon    Dr. Cristina Gong  . High cholesterol   . Hypertension   . Hypothyroidism   . Insomnia   . Lactose intolerance   . LBBB (left bundle branch block)   . Lumbar degenerative disc disease   . Onychomycosis   . Osteoarthritis   . Osteoporosis   . Palpitations   . Spinal stenosis   . Thyroid disease   . Varicose veins    Past Surgical History:  Procedure Laterality Date  . ABDOMINAL HYSTERECTOMY    . BLADDER REPAIR    . blephoplasty bilateral  03/10/2014  . CATARACT EXTRACTION W/ INTRAOCULAR LENS  IMPLANT, BILATERAL  7 & 8 2014  . CHOLECYSTECTOMY    . COLONOSCOPY  05/18/2012  . EP IMPLANTABLE DEVICE N/A 08/16/2016   Procedure: Pacemaker Implant;  Surgeon: Thompson Grayer, MD;  Location: [facility];  Service: Cardiovascular;  Laterality: N/A;  . EYE SURGERY    . herniated disc repair    . INCONTINENCE SURGERY    . JOINT REPLACEMENT    . LAMINECTOMY  02/2011   L3-L5  Dr. Ellene Route  . LUMBAR LAMINECTOMY/DECOMPRESSION MICRODISCECTOMY Right 09/21/2015   Procedure: Right Lumbar Three-FourMicrodiskectomy;  Surgeon: Kristeen Miss, MD;  Location: [facility];  Service: Neurosurgery;  Laterality: Right;  Right L3-4  Microdiskectomy  . SHOULDER ARTHROSCOPY WITH ROTATOR CUFF REPAIR Right 08/2003  . TONSILLECTOMY    . TOTAL HIP ARTHROPLASTY     right 1999    ROS- all systems are reviewed and negative except as per HPI above  Current Outpatient Prescriptions  Medication Sig Dispense Refill  . Calcium-Phosphorus-Vitamin D (CITRACAL +D3) 250-107-500 MG-MG-UNIT CHEW Chew 1 tablet by mouth daily.    Marland Kitchen denosumab (PROLIA) 60 MG/ML SOLN injection Inject 60 mg into the skin every 6 (six) months. Administer in upper arm, thigh, or abdomen    . diltiazem (CARDIZEM CD) 240 MG 24 hr capsule Take 240 mg by mouth daily.     . dorzolamide-timolol (COSOPT) 22.3-6.8 MG/ML ophthalmic solution Place 1 drop into both eyes 2 (two) times daily.  5  . ipratropium (ATROVENT) 0.06 % nasal spray Place 2 sprays into both nostrils 3 (three) times daily.    . irbesartan-hydrochlorothiazide (AVALIDE) 300-12.5 MG tablet Take 1 tablet by mouth daily. 30 tablet 6  . LACTASE PO Take 1 tablet by mouth as needed (when eating dairy products).    . Multiple Vitamins-Minerals (MULTIVITAMIN WITH MINERALS) tablet Take 1 tablet by mouth daily.    . rosuvastatin (CRESTOR) 10 MG tablet Take 5 mg by mouth at bedtime.     Marland Kitchen SYNTHROID 125 MCG tablet TAKE 1 TABLET BY MOUTH EVERY DAY 30  tablet 3   No current facility-administered medications for this visit.     Physical Exam: Vitals:   09/05/16 1203  BP: (!) 162/72  Pulse: 81  Weight: 137 lb 9.6 oz (62.4 kg)  Height: 5\' 3"  (1.6 m)    GEN- The patient is well appearing, alert and oriented x 3 today.   Head- normocephalic, atraumatic Eyes-  Sclera clear, conjunctiva pink Ears- hearing intact Oropharynx- clear Lungs- Clear to ausculation bilaterally, normal work of breathing Chest- pacemaker pocket is healing nicely,  Slightly area along medial border has not fully healed.  A steristrip is placed today Heart- Regular rate and rhythm, no murmurs, rubs or gallops, PMI not laterally  displaced GI- soft, NT, ND, + BS Extremities- no clubbing, cyanosis, or edema  Pacemaker interrogation- reviewed in detail today,  See PACEART report CXR 09/02/16 reveals stable leads, no ptx  Assessment and Plan:  1. Mobitz II second degree AV block Normal pacemaker function See Pace Art report No changes today  2. Ptx- resolved  Return in 1 week to follow-up on pacemaker incision site Return to see me in 3 months for routine post op follow-up  Thompson Grayer MD, [facility] 09/05/2016 12:28 PM

## 2016-09-05 NOTE — Patient Instructions (Addendum)
Medication Instructions:  Your physician recommends that you continue on your current medications as directed. Please refer to the Current Medication list given to you today.   Labwork: None ordered   Testing/Procedures: None ordered   Follow-Up: Your physician recommends that you schedule a follow-up appointment in: 1 week in the device clinic   Any Other Special Instructions Will Be Listed Below (If Applicable).     If you need a refill on your cardiac medications before your next appointment, please call your pharmacy.

## 2016-09-12 ENCOUNTER — Other Ambulatory Visit: Payer: Medicare Other

## 2016-09-12 DIAGNOSIS — E860 Dehydration: Secondary | ICD-10-CM | POA: Diagnosis not present

## 2016-09-12 LAB — BASIC METABOLIC PANEL
BUN: 18 mg/dL (ref 7–25)
CO2: 26 mmol/L (ref 20–31)
Calcium: 10.1 mg/dL (ref 8.6–10.4)
Chloride: 106 mmol/L (ref 98–110)
Creat: 0.74 mg/dL (ref 0.60–0.88)
Glucose, Bld: 109 mg/dL — ABNORMAL HIGH (ref 65–99)
Potassium: 4.1 mmol/L (ref 3.5–5.3)
Sodium: 141 mmol/L (ref 135–146)

## 2016-09-13 ENCOUNTER — Encounter: Payer: Self-pay | Admitting: *Deleted

## 2016-09-14 ENCOUNTER — Encounter: Payer: Self-pay | Admitting: Internal Medicine

## 2016-09-20 ENCOUNTER — Encounter: Payer: Self-pay | Admitting: Internal Medicine

## 2016-09-21 ENCOUNTER — Other Ambulatory Visit: Payer: Self-pay | Admitting: Internal Medicine

## 2016-09-26 DIAGNOSIS — H5310 Unspecified subjective visual disturbances: Secondary | ICD-10-CM | POA: Diagnosis not present

## 2016-09-26 DIAGNOSIS — Z961 Presence of intraocular lens: Secondary | ICD-10-CM | POA: Diagnosis not present

## 2016-09-26 DIAGNOSIS — H401132 Primary open-angle glaucoma, bilateral, moderate stage: Secondary | ICD-10-CM | POA: Diagnosis not present

## 2016-10-12 ENCOUNTER — Ambulatory Visit (INDEPENDENT_AMBULATORY_CARE_PROVIDER_SITE_OTHER): Payer: Medicare Other | Admitting: *Deleted

## 2016-10-12 DIAGNOSIS — Z959 Presence of cardiac and vascular implant and graft, unspecified: Secondary | ICD-10-CM

## 2016-10-12 DIAGNOSIS — R001 Bradycardia, unspecified: Secondary | ICD-10-CM

## 2016-10-12 NOTE — Progress Notes (Signed)
[name] presents to the [facility] today for a re-check of her incision. Steri-strips previously removed. Incision well-healed, incision edges approximated. No redness, swelling or drainage present. Ms. Ziebell was instructed to call if any redness, swelling or drainage develops. Return to see Dr. Rayann Heman 11/14/16.

## 2016-10-31 ENCOUNTER — Other Ambulatory Visit: Payer: Self-pay | Admitting: Internal Medicine

## 2016-11-10 ENCOUNTER — Other Ambulatory Visit: Payer: Self-pay

## 2016-11-10 DIAGNOSIS — E7849 Other hyperlipidemia: Secondary | ICD-10-CM

## 2016-11-10 MED ORDER — ROSUVASTATIN CALCIUM 10 MG PO TABS: ORAL_TABLET | ORAL | 0 refills | Status: DC

## 2016-11-14 ENCOUNTER — Encounter: Payer: Self-pay | Admitting: Internal Medicine

## 2016-11-14 ENCOUNTER — Ambulatory Visit (INDEPENDENT_AMBULATORY_CARE_PROVIDER_SITE_OTHER): Payer: Medicare Other | Admitting: Internal Medicine

## 2016-11-14 VITALS — BP 124/70 | HR 80 | Ht 63.5 in | Wt 139.8 lb

## 2016-11-14 DIAGNOSIS — Z950 Presence of cardiac pacemaker: Secondary | ICD-10-CM

## 2016-11-14 DIAGNOSIS — I441 Atrioventricular block, second degree: Secondary | ICD-10-CM

## 2016-11-14 LAB — CUP PACEART INCLINIC DEVICE CHECK
Battery Remaining Longevity: 119 mo
Battery Voltage: 3.05 V
Brady Statistic AP VP Percent: 0.44 %
Brady Statistic AS VS Percent: 6.18 %
Brady Statistic RA Percent Paced: 0.46 %
Brady Statistic RV Percent Paced: 93.68 %
Implantable Lead Implant Date: 20171219
Implantable Lead Location: 753859
Implantable Lead Model: 5076
Implantable Lead Model: 5076
Implantable Pulse Generator Implant Date: 20171219
Lead Channel Impedance Value: 342 Ohm
Lead Channel Impedance Value: 361 Ohm
Lead Channel Impedance Value: 437 Ohm
Lead Channel Pacing Threshold Amplitude: 0.75 V
Lead Channel Pacing Threshold Amplitude: 1 V
Lead Channel Sensing Intrinsic Amplitude: 3.375 mV
Lead Channel Setting Pacing Amplitude: 2 V
Lead Channel Setting Sensing Sensitivity: 2.8 mV
MDC IDC LEAD IMPLANT DT: 20171219
MDC IDC LEAD LOCATION: 753860
MDC IDC MSMT LEADCHNL RA IMPEDANCE VALUE: 304 Ohm
MDC IDC MSMT LEADCHNL RA PACING THRESHOLD PULSEWIDTH: 0.4 ms
MDC IDC MSMT LEADCHNL RA SENSING INTR AMPL: 3.125 mV
MDC IDC MSMT LEADCHNL RV PACING THRESHOLD PULSEWIDTH: 0.4 ms
MDC IDC MSMT LEADCHNL RV SENSING INTR AMPL: 7.5 mV
MDC IDC MSMT LEADCHNL RV SENSING INTR AMPL: 8.125 mV
MDC IDC SESS DTM: 20180319131250
MDC IDC SET LEADCHNL RV PACING AMPLITUDE: 2.5 V
MDC IDC SET LEADCHNL RV PACING PULSEWIDTH: 0.4 ms
MDC IDC STAT BRADY AP VS PERCENT: 0.02 %
MDC IDC STAT BRADY AS VP PERCENT: 93.35 %

## 2016-11-14 NOTE — Progress Notes (Signed)
PCP: Hollace Kinnier, DO  Destiny [name] is a 83 y.o. female who presents today for routine electrophysiology followup.  Since her last visit, the patient reports doing very well. She has returned to baseline.  She has some ROM decreased in L shoulder.  She has an appointment with Dr Onnie Graham.  I suspect reduced function due to reduced use.  I have encouraged physical therapy for ROM exercises.  Today, she denies symptoms of palpitations, chest pain, shortness of breath,  lower extremity edema, dizziness, presyncope, or syncope.  The patient is otherwise without complaint today.   Past Medical History:  Diagnosis Date  . Allergy   . Arthritis   . 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 Cataract   . Chronic anxiety   . Constipation   . Diverticulosis of sigmoid colon   . Emphysema of lung ([place])   . GERD (gastroesophageal reflux disease)   . Glaucoma   . H/O adenomatous polyp of colon    Dr. Cristina Gong  . High cholesterol   . Hypertension   . Hypothyroidism   . Insomnia   . Lactose intolerance   . Lumbar degenerative disc disease   . Onychomycosis   . Osteoarthritis   . Osteoporosis   . Second degree Mobitz II AV block    s/p PPM  . Spinal stenosis   . Thyroid disease   . Varicose veins    Past Surgical History:  Procedure Laterality Date  . ABDOMINAL HYSTERECTOMY    . BLADDER REPAIR    . blephoplasty bilateral  03/10/2014  . CATARACT EXTRACTION W/ INTRAOCULAR LENS  IMPLANT, BILATERAL  7 & 8 2014  . CHOLECYSTECTOMY    . COLONOSCOPY  05/18/2012  . EP IMPLANTABLE DEVICE N/A 08/16/2016   Procedure: Pacemaker Implant;  Surgeon: Thompson Grayer, MD;  Location: [facility];  Service: Cardiovascular;  Laterality: N/A;  . EYE SURGERY    . herniated disc repair    . INCONTINENCE SURGERY    . JOINT REPLACEMENT    . LAMINECTOMY  02/2011   L3-L5  Dr. Ellene Route  . LUMBAR LAMINECTOMY/DECOMPRESSION MICRODISCECTOMY Right 09/21/2015   Procedure: Right Lumbar Three-FourMicrodiskectomy;  Surgeon: Kristeen Miss, MD;  Location: [facility] [facility];  Service: Neurosurgery;  Laterality: Right;  Right L3-4 Microdiskectomy  . SHOULDER ARTHROSCOPY WITH ROTATOR CUFF REPAIR Right 08/2003  . TONSILLECTOMY    . TOTAL HIP ARTHROPLASTY     right 1999    ROS- all systems are reviewed and negative except as per HPI above  Current Outpatient Prescriptions  Medication Sig Dispense Refill  . Calcium-Phosphorus-Vitamin D (CITRACAL +D3) 250-107-500 MG-MG-UNIT CHEW Chew 1 tablet by mouth daily.    Marland Kitchen denosumab (PROLIA) 60 MG/ML SOLN injection Inject 60 mg into the skin every 6 (six) months. Administer in upper arm, thigh, or abdomen    . diltiazem (CARDIZEM CD) 240 MG 24 hr capsule Take 240 mg by mouth daily.     . dorzolamide-timolol (COSOPT) 22.3-6.8 MG/ML ophthalmic solution Place 1 drop into both eyes 2 (two) times daily.  5  . ipratropium (ATROVENT) 0.06 % nasal spray as directed. Instill 2 sprays into each nostril three times a day if needed.    . irbesartan-hydrochlorothiazide (AVALIDE) 300-12.5 MG tablet Take 1 tablet by mouth daily. 30 tablet 6  . LACTASE PO Take 1 tablet by mouth as needed (when eating dairy products). Take as directed    . Multiple Vitamins-Minerals (MULTIVITAMIN  WITH MINERALS) tablet Take 1 tablet by mouth daily.    . rosuvastatin (CRESTOR) 10 MG tablet LABS OVERDUE, 1 by mouth daily at bedtime 30 tablet 0  . SYNTHROID 125 MCG tablet take 1 tablet by mouth once daily 30 tablet 1   No current facility-administered medications for this visit.     Physical Exam: Vitals:   11/14/16 1243  BP: 124/70  Pulse: 80  SpO2: 95%  Weight: 139 lb 12.8 oz (63.4 kg)  Height: 5' 3.5" (1.613 m)    GEN- The patient is well appearing, alert and oriented x 3 today.   Head- normocephalic, atraumatic Eyes-  Sclera clear, conjunctiva pink Ears- hearing intact Oropharynx- clear Lungs- Clear to ausculation bilaterally, normal work of  breathing Chest- pacemaker pocket is healing nicely,  Slightly area along medial border has not fully healed.  A steristrip is placed today Heart- Regular rate and rhythm, no murmurs, rubs or gallops, PMI not laterally displaced GI- soft, NT, ND, + BS Extremities- no clubbing, cyanosis, or edema  Pacemaker interrogation- personally reviewed in detail today,  See PACEART report ekg today reveals sinus rhythm with IVCD  Assessment and Plan:  1. Mobitz II second degree AV block Normal pacemaker function See Pace Art report MVP programmed on as she is conducting intrinsically today  2. L shoulder pain I worry about reduced use and frozen shoulder.  I have encouraged full ROM and use (there should be no limitations based on PPM).  Physical therapy is advised.  She will see Dr Onnie Graham soon.  [facility] Return to see EP NP every year  Thompson Grayer MD, [facility] 11/14/2016 1:13 PM

## 2016-11-14 NOTE — Patient Instructions (Signed)
Medication Instructions: - Your physician recommends that you continue on your current medications as directed. Please refer to the Current Medication list given to you today.  Labwork: - none ordered  Procedures/Testing: - none ordered  Follow-Up: - Remote monitoring is used to monitor your Pacemaker of ICD from home. This monitoring reduces the number of office visits required to check your device to one time per year. It allows us to keep an eye on the functioning of your device to ensure it is working properly. You are scheduled for a device check from home on 02/13/17. You may send your transmission at any time that day. If you have a wireless device, the transmission will be sent automatically. After your physician reviews your transmission, you will receive a postcard with your next transmission date.  - Your physician wants you to follow-up in: 1 year with Amber Seiler, NP for Dr. Allred.  You will receive a reminder letter in the mail two months in advance. If you don't receive a letter, please call our office to schedule the follow-up appointment.  Any Additional Special Instructions Will Be Listed Below (If Applicable).     If you need a refill on your cardiac medications before your next appointment, please call your pharmacy.   

## 2016-11-21 ENCOUNTER — Other Ambulatory Visit: Payer: Self-pay | Admitting: Internal Medicine

## 2016-11-23 DIAGNOSIS — M19012 Primary osteoarthritis, left shoulder: Secondary | ICD-10-CM | POA: Diagnosis not present

## 2016-12-21 ENCOUNTER — Other Ambulatory Visit: Payer: Self-pay | Admitting: Internal Medicine

## 2017-01-11 ENCOUNTER — Non-Acute Institutional Stay: Payer: Medicare Other | Admitting: Internal Medicine

## 2017-01-11 ENCOUNTER — Encounter: Payer: Self-pay | Admitting: Internal Medicine

## 2017-01-11 VITALS — BP 138/66 | HR 78 | Temp 98.0°F | Ht 64.0 in | Wt 139.0 lb

## 2017-01-11 DIAGNOSIS — M19011 Primary osteoarthritis, right shoulder: Secondary | ICD-10-CM

## 2017-01-11 DIAGNOSIS — D649 Anemia, unspecified: Secondary | ICD-10-CM

## 2017-01-11 DIAGNOSIS — M19012 Primary osteoarthritis, left shoulder: Secondary | ICD-10-CM | POA: Diagnosis not present

## 2017-01-11 DIAGNOSIS — Z950 Presence of cardiac pacemaker: Secondary | ICD-10-CM

## 2017-01-11 DIAGNOSIS — E034 Atrophy of thyroid (acquired): Secondary | ICD-10-CM | POA: Diagnosis not present

## 2017-01-11 DIAGNOSIS — E739 Lactose intolerance, unspecified: Secondary | ICD-10-CM

## 2017-01-11 DIAGNOSIS — E785 Hyperlipidemia, unspecified: Secondary | ICD-10-CM | POA: Diagnosis not present

## 2017-01-11 DIAGNOSIS — M810 Age-related osteoporosis without current pathological fracture: Secondary | ICD-10-CM

## 2017-01-11 MED ORDER — DICLOFENAC SODIUM 1 % TD GEL
2.0000 g | Freq: Four times a day (QID) | TRANSDERMAL | 5 refills | Status: DC
Start: 2017-01-11 — End: 2017-12-13

## 2017-01-11 NOTE — Progress Notes (Signed)
Location:  [place] of Service:  Clinic (12)  Provider: Kierra Jezewski L. Mariea Clonts, D.O., C.M.D.  Code Status: DNR Goals of Care:  Advanced Directives 01/11/2017  Does Patient Have a Medical Advance Directive? Yes  Type of Advance Directive [place]  Does patient want to make changes to medical advance directive? -  Copy of [place] in Chart? Yes    Chief Complaint  Patient presents with  . Acute Visit    feeling tired    HPI: Patient is a 83 y.o. female seen today for an acute visit for feeling tired.  She wonders if it's her age or something else.  She is less tired than she was before her pacemaker and she's no longer sob.    She is having a lot of difficulty with arthritis in her shoulders.  It flared up after she could not move her left arm after the pacemaker.  Had injection in left shoulder from Dr. Onnie Graham.  Right shoulder now sore instead from overuse as left never did get better.  Has been taking aleve which she does not usually do.  2 in am and 1 at night.  Taking for over a month.  She says she's not sure it's helped anyway.  Weather changing not helpful.  Discussed returning to tylenol.   Aspercreme helps only for a few mins, then stops.    Eating less dairy due to lactose intolerance.    Due for labs.  Was supposed to have AWV rescheduled but never heard back.    Past Medical History:  Diagnosis Date  . Allergy   . Arthritis   . 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 Cataract   . Chronic anxiety   . Constipation   . Diverticulosis of sigmoid colon   . Emphysema of lung ([place])   . GERD (gastroesophageal reflux disease)   . Glaucoma   . H/O adenomatous polyp of colon    Dr. Cristina Gong  . High cholesterol   . Hypertension   . Hypothyroidism   . Insomnia   . Lactose intolerance   . Lumbar degenerative disc disease   . Onychomycosis   . Osteoarthritis   .  Osteoporosis   . Second degree Mobitz II AV block    s/p PPM  . Spinal stenosis   . Thyroid disease   . Varicose veins     Past Surgical History:  Procedure Laterality Date  . ABDOMINAL HYSTERECTOMY    . BLADDER REPAIR    . blephoplasty bilateral  03/10/2014  . CATARACT EXTRACTION W/ INTRAOCULAR LENS  IMPLANT, BILATERAL  7 & 8 2014  . CHOLECYSTECTOMY    . COLONOSCOPY  05/18/2012  . EP IMPLANTABLE DEVICE N/A 08/16/2016   MDT [employer]vis[employer] MRI conditional PPM implanted by Dr Rayann Heman for mobitz II second degree AV block  . EYE SURGERY    . herniated disc repair    . INCONTINENCE SURGERY    . JOINT REPLACEMENT    . LAMINECTOMY  02/2011   L3-L5  Dr. Ellene Route  . LUMBAR LAMINECTOMY/DECOMPRESSION MICRODISCECTOMY Right 09/21/2015   Procedure: Right Lumbar Three-FourMicrodiskectomy;  Surgeon: Kristeen Miss, MD;  Location: [place] [facility];  Service: Neurosurgery;  Laterality: Right;  Right L3-4 Microdiskectomy  . SHOULDER ARTHROSCOPY WITH ROTATOR CUFF REPAIR Right 08/2003  . TONSILLECTOMY    . TOTAL HIP ARTHROPLASTY     right 1999    Allergies  Allergen Reactions  . Codeine Nausea Only  . Macrobid [Nitrofurantoin] Diarrhea  . Miralax [Polyethylene Glycol] Other (See Comments)    cramping  . Pain Relief [Acetaminophen] Nausea And Vomiting  . Paxil [Paroxetine Hcl] Nausea Only  . Percodan [Oxycodone-Aspirin] Itching  . Oxycodone Nausea Only  . Oxycodone-Acetaminophen Nausea Only  . Penicillins Rash    Has patient had a PCN reaction causing immediate rash, facial/tongue/throat swelling, SOB or lightheadedness with hypotension: YES Has patient had a PCN reaction causing severe rash involving mucus membranes or skin necrosis: NO Has patient had a PCN reaction that required hospitalization NO Has patient had a PCN reaction occurring within the last 10 years: NO If all of the above answers are "NO", then may proceed with Cephalosporin use.    Allergies as of 01/11/2017      Reactions   Codeine Nausea  Only   Macrobid [nitrofurantoin] Diarrhea   Miralax [polyethylene Glycol] Other (See Comments)   cramping   Pain Relief [acetaminophen] Nausea And Vomiting   Paxil [paroxetine Hcl] Nausea Only   Percodan [oxycodone-aspirin] Itching   Oxycodone Nausea Only   Oxycodone-acetaminophen Nausea Only   Penicillins Rash   Has patient had a PCN reaction causing immediate rash, facial/tongue/throat swelling, SOB or lightheadedness with hypotension: YES Has patient had a PCN reaction causing severe rash involving mucus membranes or skin necrosis: NO Has patient had a PCN reaction that required hospitalization NO Has patient had a PCN reaction occurring within the last 10 years: NO If all of the above answers are "NO", then may proceed with Cephalosporin use.      Medication List       Accurate as of 01/11/17  9:44 AM. Always use your most recent med list.          CITRACAL +D3 250-107-500 MG-MG-UNIT Chew Generic drug:  Calcium-Phosphorus-Vitamin D Chew 2 tablets by mouth daily.   diltiazem 240 MG 24 hr capsule Commonly known as:  CARDIZEM CD Take 240 mg by mouth daily.   dorzolamide-timolol 22.3-6.8 MG/ML ophthalmic solution Commonly known as:  COSOPT Place 1 drop into both eyes 2 (two) times daily.   ipratropium 0.06 % nasal spray Commonly known as:  ATROVENT as directed. Instill 2 sprays into each nostril three times a day if needed.   irbesartan-hydrochlorothiazide 300-12.5 MG tablet Commonly known as:  AVALIDE Take 1 tablet by mouth daily.   LACTASE PO Take 1 tablet by mouth as needed (when eating dairy products). Take as directed   multivitamin with minerals tablet Take 1 tablet by mouth daily.   PROLIA 60 MG/ML Soln injection Generic drug:  denosumab Inject 60 mg into the skin every 6 (six) months. Administer in upper arm, thigh, or abdomen   rosuvastatin 10 MG tablet Commonly known as:  CRESTOR Take 10 mg by mouth daily. Take 1/2 tablet daily for cholesterol     SYNTHROID 125 MCG tablet Generic drug:  levothyroxine take 1 tablet by mouth once daily       Review of Systems:  Review of Systems  Constitutional: Positive for malaise/fatigue. Negative for chills and fever.  HENT: Negative for congestion.   Eyes: Negative for blurred vision.  Respiratory: Negative for cough, shortness of breath and wheezing.   Cardiovascular: Positive for leg swelling. Negative for chest pain and palpitations.  Gastrointestinal: Positive for constipation. Negative for abdominal pain, blood in stool and melena.       Does ok if she eats plenty of fruits and veggies  Genitourinary: Negative  for dysuria, frequency and urgency.  Musculoskeletal: Positive for joint pain. Negative for falls.       Bilateral shoulders, left worse, also still has pain from pacemaker when she adducts her left arm  Skin: Negative for itching and rash.  Neurological: Negative for dizziness, loss of consciousness, weakness and headaches.  Endo/Heme/Allergies: Bruises/bleeds easily.  Psychiatric/Behavioral: Negative for depression and memory loss.    Health Maintenance  Topic Date Due  . TETANUS/TDAP  08/20/1947  . PNA vac Low Risk Adult (2 of 2 - PCV13) 08/29/2014  . INFLUENZA VACCINE  03/29/2017  . DEXA SCAN  Completed    Physical Exam: Vitals:   01/11/17 0938  BP: 138/66  Pulse: 78  Temp: 98 F (36.7 C)  TempSrc: Oral  SpO2: 96%  Weight: 139 lb (63 kg)  Height: 5\' 4"  (1.626 m)   Body mass index is 23.86 kg/m. Physical Exam  Constitutional: She is oriented to person, place, and time. She appears well-developed and well-nourished. No distress.  Cardiovascular: Normal rate, regular rhythm, normal heart sounds and intact distal pulses.   Pulmonary/Chest: Effort normal and breath sounds normal. She has no wheezes.  Abdominal: Bowel sounds are normal.  Musculoskeletal: Normal range of motion. She exhibits tenderness.  Bilateral shouldlers, left anteriorly where pacemaker and  shoulder meet during adduction  Neurological: She is alert and oriented to person, place, and time.  Skin: Skin is dry. There is pallor.  Psychiatric: She has a normal mood and affect.    Labs reviewed: Basic Metabolic Panel:  Recent Labs  03/07/16 1146  08/15/16 1225 08/16/16 0226 08/17/16 0202 09/12/16 1058  NA  --   < > 143 141 140 141  K  --   < > 3.8 3.2* 4.0 4.1  CL  --   < > 109 108 110 106  CO2  --   < > 26 23 26 26   GLUCOSE  --   < > 121* 99 98 109*  BUN  --   < > 17 13 13 18   CREATININE  --   < > 0.84 0.77 1.02* 0.74  CALCIUM  --   < > 10.4* 9.8 9.3 10.1  MG  --   --  2.1  --   --   --   TSH 0.48  --  4.876*  --   --   --   < > = values in this interval not displayed. Liver Function Tests:  Recent Labs  05/12/16 1345 08/17/16 0202  AST 18 22  ALT 11 16  ALKPHOS 59 52  BILITOT 0.6 1.0  PROT 6.1 5.2*  ALBUMIN 4.3 3.5   No results for input(s): LIPASE, AMYLASE in the last 8760 hours. No results for input(s): AMMONIA in the last 8760 hours. CBC:  Recent Labs  05/12/16 1345 08/15/16 1225 08/16/16 0226 08/17/16 0202  WBC 6.8 8.2 7.6 8.3  NEUTROABS 4,216 5.6  --   --   HGB 12.5 13.2 11.8* 12.4  HCT 38.5 40.8 36.2 39.3  MCV 91.0 92.5 92.1 93.3  PLT 265 218 215 209   Assessment/Plan 1. Bilateral shoulder region arthritis -stop aleve, use tylenol instead b/c she's not sure the aleve helped anyway - diclofenac sodium (VOLTAREN) 1 % GEL; Apply 2 g topically 4 (four) times daily.  Dispense: 100 g; Refill: 5  2. Hypothyroidism due to acquired atrophy of thyroid -f/u tsh -cont current levothyroxine until then, but may be cause of fatigue -not sleeping well due to #1 which  may be contributing  3. Lactose intolerance in adult -ongoing, limits her ca with d and d intake in diet, but is taking supplements  4. Senile osteoporosis -cont prolia, ca with D and additional D -due for dexa recheck so will [facility] for her  5. S/P placement  of cardiac pacemaker -still has some discomfort in the left upper chest/shoulder but energy better than before insertion  6.  Anemia -due for recheck of cbc, could also be contributing to fatigue if not improved  Labs/tests ordered:   Orders Placed This Encounter  Procedures  . DG Bone Density    Standing Status:   Future    Standing Expiration Date:   03/13/2018    Order Specific Question:   Reason for Exam (SYMPTOM  OR DIAGNOSIS REQUIRED)    Answer:   senile osteoporosis, thyroid disease, prior studies at [facility] before switched practices    Order Specific Question:   Preferred imaging location?    Answer:   [facility]  . CBC with Differential/Platelet    Standing Status:   Future    Standing Expiration Date:   08/21/2017  . Basic metabolic panel    Standing Status:   Future    Standing Expiration Date:   08/21/2017    Order Specific Question:   Has the patient fasted?    Answer:   Yes  . Hepatic function panel    Standing Status:   Future    Standing Expiration Date:   08/21/2017  . Lipid panel    Standing Status:   Future    Standing Expiration Date:   08/21/2017    Order Specific Question:   Has the patient fasted?    Answer:   Yes  . TSH    Standing Status:   Future    Standing Expiration Date:   08/21/2017  . T4, free    Standing Status:   Future    Standing Expiration Date:   08/21/2017  also prolia overdue, must get reauthorized  Next appt:  03/02/2017 AWV in office   Jaedan Schuman L. Arvis Zwahlen, D.O. [facility] [address], [place] [address] Cell Phone (Mon-Fri 8am-5pm):  [phone] On Call:  [phone] & follow prompts after 5pm & weekends Office Phone:  [phone] Office Fax:  [phone]

## 2017-01-16 DIAGNOSIS — H401132 Primary open-angle glaucoma, bilateral, moderate stage: Secondary | ICD-10-CM | POA: Diagnosis not present

## 2017-01-16 DIAGNOSIS — Z961 Presence of intraocular lens: Secondary | ICD-10-CM | POA: Diagnosis not present

## 2017-01-16 DIAGNOSIS — H5310 Unspecified subjective visual disturbances: Secondary | ICD-10-CM | POA: Diagnosis not present

## 2017-01-18 ENCOUNTER — Encounter: Payer: Self-pay | Admitting: Internal Medicine

## 2017-01-19 ENCOUNTER — Other Ambulatory Visit: Payer: Medicare Other

## 2017-01-19 DIAGNOSIS — D649 Anemia, unspecified: Secondary | ICD-10-CM

## 2017-01-19 DIAGNOSIS — E785 Hyperlipidemia, unspecified: Secondary | ICD-10-CM | POA: Diagnosis not present

## 2017-01-19 DIAGNOSIS — Z950 Presence of cardiac pacemaker: Secondary | ICD-10-CM

## 2017-01-19 DIAGNOSIS — E034 Atrophy of thyroid (acquired): Secondary | ICD-10-CM | POA: Diagnosis not present

## 2017-01-19 DIAGNOSIS — E739 Lactose intolerance, unspecified: Secondary | ICD-10-CM

## 2017-01-19 LAB — HEPATIC FUNCTION PANEL
ALT: 12 U/L (ref 6–29)
AST: 17 U/L (ref 10–35)
Albumin: 3.9 g/dL (ref 3.6–5.1)
Alkaline Phosphatase: 71 U/L (ref 33–130)
Bilirubin, Direct: 0.2 mg/dL (ref <=0.2)
Indirect Bilirubin: 0.6 mg/dL (ref 0.2–1.2)
Total Bilirubin: 0.8 mg/dL (ref 0.2–1.2)
Total Protein: 5.9 g/dL — ABNORMAL LOW (ref 6.1–8.1)

## 2017-01-19 LAB — LIPID PANEL
Cholesterol: 138 mg/dL (ref <200)
HDL: 59 mg/dL (ref >50)
LDL Cholesterol: 52 mg/dL (ref <100)
Total CHOL/HDL Ratio: 2.3 Ratio (ref <5.0)
Triglycerides: 134 mg/dL (ref <150)
VLDL: 27 mg/dL (ref <30)

## 2017-01-19 LAB — CBC WITH DIFFERENTIAL/PLATELET
Basophils Absolute: 66 cells/uL (ref 0–200)
Basophils Relative: 1 %
Eosinophils Absolute: 528 cells/uL — ABNORMAL HIGH (ref 15–500)
Eosinophils Relative: 8 %
HCT: 37.6 % (ref 35.0–45.0)
Hemoglobin: 12.4 g/dL (ref 11.7–15.5)
Lymphocytes Relative: 22 %
Lymphs Abs: 1452 cells/uL (ref 850–3900)
MCH: 29.5 pg (ref 27.0–33.0)
MCHC: 33 g/dL (ref 32.0–36.0)
MCV: 89.5 fL (ref 80.0–100.0)
MPV: 10.2 fL (ref 7.5–12.5)
Monocytes Absolute: 594 cells/uL (ref 200–950)
Monocytes Relative: 9 %
Neutro Abs: 3960 cells/uL (ref 1500–7800)
Neutrophils Relative %: 60 %
Platelets: 263 10*3/uL (ref 140–400)
RBC: 4.2 MIL/uL (ref 3.80–5.10)
RDW: 14.4 % (ref 11.0–15.0)
WBC: 6.6 10*3/uL (ref 3.8–10.8)

## 2017-01-19 LAB — BASIC METABOLIC PANEL
BUN: 15 mg/dL (ref 7–25)
CO2: 25 mmol/L (ref 20–31)
Calcium: 9.5 mg/dL (ref 8.6–10.4)
Chloride: 108 mmol/L (ref 98–110)
Creat: 0.61 mg/dL (ref 0.60–0.88)
Glucose, Bld: 101 mg/dL — ABNORMAL HIGH (ref 65–99)
Potassium: 4 mmol/L (ref 3.5–5.3)
Sodium: 143 mmol/L (ref 135–146)

## 2017-01-19 LAB — TSH: TSH: 0.9 mIU/L

## 2017-01-19 LAB — T4, FREE: Free T4: 1.7 ng/dL (ref 0.8–1.8)

## 2017-01-20 ENCOUNTER — Encounter: Payer: Self-pay | Admitting: *Deleted

## 2017-01-30 ENCOUNTER — Telehealth: Payer: Self-pay | Admitting: *Deleted

## 2017-01-30 NOTE — Telephone Encounter (Signed)
.  left message to have patient return my call. Pt has been approved with 0% copay and per note from [place] patient is good to go for prolia and if we can inject asap she can get it again in December and won't have to worry about her deductible starting over.

## 2017-02-07 ENCOUNTER — Ambulatory Visit: Payer: Self-pay

## 2017-02-08 ENCOUNTER — Other Ambulatory Visit: Payer: Self-pay | Admitting: Internal Medicine

## 2017-02-08 ENCOUNTER — Encounter: Payer: Self-pay | Admitting: Internal Medicine

## 2017-02-08 ENCOUNTER — Ambulatory Visit: Payer: Medicare Other | Admitting: Internal Medicine

## 2017-02-08 DIAGNOSIS — M810 Age-related osteoporosis without current pathological fracture: Secondary | ICD-10-CM

## 2017-02-08 DIAGNOSIS — B370 Candidal stomatitis: Secondary | ICD-10-CM | POA: Diagnosis not present

## 2017-02-08 DIAGNOSIS — K140 Glossitis: Secondary | ICD-10-CM

## 2017-02-08 MED ORDER — NYSTATIN 100000 UNIT/ML MT SUSP: 5.0000 mL | Freq: Four times a day (QID) | OROMUCOSAL | 0 refills | Status: DC

## 2017-02-08 MED ORDER — DENOSUMAB 60 MG/ML ~~LOC~~ SOLN
60.0000 mg | Freq: Once | SUBCUTANEOUS | Status: AC
  Administered 2017-02-08: 60 mg via SUBCUTANEOUS

## 2017-02-08 NOTE — Progress Notes (Signed)
Location:  [facility]   Place of Service:   clinic  Provider: Seena Ritacco L. Mariea Clonts, D.O., C.M.D.  Code Status: DNR Goals of Care:  Advanced Directives 01/11/2017  Does Patient Have a Medical Advance Directive? Yes  Type of Advance Directive [place]  Does patient want to make changes to medical advance directive? -  Copy of [place] in Chart? Yes     Chief Complaint  Patient presents with  . Acute Visit    tongue ulcer    HPI: Patient is a 83 y.o. female seen today for an acute visit for prolia injection and ulcer on her tongue.    Pt came and received prolia injection, first one with [place], but she'd been getting them with her prior PCP.  She is due for bone density, but this has not been done yet though ordered last month.  Pt has not heard anything.    She used a new toothpaste that is to help her gums with increased fluoride in it.  She developed the ulcer of her tongue after that.  She quit the toothpaste 3 wks ago and started with salt water rinses, but still has the ulceration of the left side of her tongue.    Past Medical History:  Diagnosis Date  . Allergy   . Arthritis   . 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 Cataract   . Chronic anxiety   . Constipation   . Diverticulosis of sigmoid colon   . Emphysema of lung ([place])   . GERD (gastroesophageal reflux disease)   . Glaucoma   . H/O adenomatous polyp of colon    Dr. Cristina Gong  . High cholesterol   . Hypertension   . Hypothyroidism   . Insomnia   . Lactose intolerance   . Lumbar degenerative disc disease   . Onychomycosis   . Osteoarthritis   . Osteoporosis   . Second degree Mobitz II AV block    s/p PPM  . Spinal stenosis   . Thyroid disease   . Varicose veins     Past Surgical History:  Procedure Laterality Date  . ABDOMINAL HYSTERECTOMY    . BLADDER REPAIR    . blephoplasty bilateral  03/10/2014  . CATARACT  EXTRACTION W/ INTRAOCULAR LENS  IMPLANT, BILATERAL  7 & 8 2014  . CHOLECYSTECTOMY    . COLONOSCOPY  05/18/2012  . EP IMPLANTABLE DEVICE N/A 08/16/2016   MDT [employer] MRI conditional PPM implanted by Dr Rayann Heman for mobitz II second degree AV block  . EYE SURGERY    . herniated disc repair    . INCONTINENCE SURGERY    . JOINT REPLACEMENT    . LAMINECTOMY  02/2011   L3-L5  Dr. Ellene Route  . LUMBAR LAMINECTOMY/DECOMPRESSION MICRODISCECTOMY Right 09/21/2015   Procedure: Right Lumbar Three-FourMicrodiskectomy;  Surgeon: Kristeen Miss, MD;  Location: [facility];  Service: Neurosurgery;  Laterality: Right;  Right L3-4 Microdiskectomy  . SHOULDER ARTHROSCOPY WITH ROTATOR CUFF REPAIR Right 08/2003  . TONSILLECTOMY    . TOTAL HIP ARTHROPLASTY     right 1999    Allergies  Allergen Reactions  . Codeine Nausea Only  . Macrobid [Nitrofurantoin] Diarrhea  . Miralax [Polyethylene Glycol] Other (See Comments)    cramping  . Pain Relief [Acetaminophen] Nausea And Vomiting  . Paxil [Paroxetine Hcl] Nausea Only  . Percodan [Oxycodone-Aspirin] Itching  . Oxycodone Nausea Only  . Oxycodone-Acetaminophen Nausea Only  .  Penicillins Rash    Has patient had a PCN reaction causing immediate rash, facial/tongue/throat swelling, SOB or lightheadedness with hypotension: YES Has patient had a PCN reaction causing severe rash involving mucus membranes or skin necrosis: NO Has patient had a PCN reaction that required hospitalization NO Has patient had a PCN reaction occurring within the last 10 years: NO If all of the above answers are "NO", then may proceed with Cephalosporin use.    Allergies as of 02/08/2017      Reactions   Codeine Nausea Only   Macrobid [nitrofurantoin] Diarrhea   Miralax [polyethylene Glycol] Other (See Comments)   cramping   Pain Relief [acetaminophen] Nausea And Vomiting   Paxil [paroxetine Hcl] Nausea Only   Percodan [oxycodone-aspirin] Itching   Oxycodone Nausea Only    Oxycodone-acetaminophen Nausea Only   Penicillins Rash   Has patient had a PCN reaction causing immediate rash, facial/tongue/throat swelling, SOB or lightheadedness with hypotension: YES Has patient had a PCN reaction causing severe rash involving mucus membranes or skin necrosis: NO Has patient had a PCN reaction that required hospitalization NO Has patient had a PCN reaction occurring within the last 10 years: NO If all of the above answers are "NO", then may proceed with Cephalosporin use.      Medication List       Accurate as of 02/08/17 11:16 AM. Always use your most recent med list.          CITRACAL +D3 250-107-500 MG-MG-UNIT Chew Generic drug:  Calcium-Phosphorus-Vitamin D Chew 2 tablets by mouth daily.   diclofenac sodium 1 % Gel Commonly known as:  VOLTAREN Apply 2 g topically 4 (four) times daily.   diltiazem 240 MG 24 hr capsule Commonly known as:  CARDIZEM CD Take 240 mg by mouth daily.   dorzolamide-timolol 22.3-6.8 MG/ML ophthalmic solution Commonly known as:  COSOPT Place 1 drop into both eyes 2 (two) times daily.   ipratropium 0.06 % nasal spray Commonly known as:  ATROVENT as directed. Instill 2 sprays into each nostril three times a day if needed.   irbesartan-hydrochlorothiazide 300-12.5 MG tablet Commonly known as:  AVALIDE Take 1 tablet by mouth daily.   LACTASE PO Take 1 tablet by mouth as needed (when eating dairy products). Take as directed   multivitamin with minerals tablet Take 1 tablet by mouth daily.   nystatin 100000 UNIT/ML suspension Commonly known as:  MYCOSTATIN Take 5 mLs (500,000 Units total) by mouth 4 (four) times daily.   rosuvastatin 10 MG tablet Commonly known as:  CRESTOR Take 10 mg by mouth daily. Take 1/2 tablet daily for cholesterol   SYNTHROID 125 MCG tablet Generic drug:  levothyroxine take 1 tablet by mouth once daily       Review of Systems:  Review of Systems  Constitutional: Negative for chills and  fever.  HENT: Negative for sore throat.   Respiratory: Negative for shortness of breath.   Musculoskeletal: Positive for joint pain. Negative for falls.  Skin:       Ulceration left side of tongue, white plaque on tongue more than normal  Psychiatric/Behavioral: Negative for memory loss.    Health Maintenance  Topic Date Due  . TETANUS/TDAP  08/20/1947  . PNA vac Low Risk Adult (2 of 2 - PCV13) 08/29/2014  . INFLUENZA VACCINE  03/29/2017  . DEXA SCAN  Completed    Physical Exam: There were no vitals filed for this visit. There is no height or weight on file to calculate BMI. Physical  Exam  Constitutional: She is oriented to person, place, and time. She appears well-developed and well-nourished. No distress.  Neurological: She is alert and oriented to person, place, and time.  Skin: Skin is warm and dry.  White plaque on tongue, ulceration left side of tongue  Psychiatric: She has a normal mood and affect.    Labs reviewed: Basic Metabolic Panel:  Recent Labs  03/07/16 1146  08/15/16 1225  08/17/16 0202 09/12/16 1058 01/19/17 0937  NA  --   < > 143  < > 140 141 143  K  --   < > 3.8  < > 4.0 4.1 4.0  CL  --   < > 109  < > 110 106 108  CO2  --   < > 26  < > 26 26 25   GLUCOSE  --   < > 121*  < > 98 109* 101*  BUN  --   < > 17  < > 13 18 15   CREATININE  --   < > 0.84  < > 1.02* 0.74 0.61  CALCIUM  --   < > 10.4*  < > 9.3 10.1 9.5  MG  --   --  2.1  --   --   --   --   TSH 0.48  --  4.876*  --   --   --  0.90  < > = values in this interval not displayed. Liver Function Tests:  Recent Labs  05/12/16 1345 08/17/16 0202 01/19/17 0937  AST 18 22 17   ALT 11 16 12   ALKPHOS 59 52 71  BILITOT 0.6 1.0 0.8  PROT 6.1 5.2* 5.9*  ALBUMIN 4.3 3.5 3.9   No results for input(s): LIPASE, AMYLASE in the last 8760 hours. No results for input(s): AMMONIA in the last 8760 hours. CBC:  Recent Labs  05/12/16 1345 08/15/16 1225 08/16/16 0226 08/17/16 0202 01/19/17 0937    WBC 6.8 8.2 7.6 8.3 6.6  NEUTROABS 4,216 5.6  --   --  3,960  HGB 12.5 13.2 11.8* 12.4 12.4  HCT 38.5 40.8 36.2 39.3 37.6  MCV 91.0 92.5 92.1 93.3 89.5  PLT 265 218 215 209 263   Lipid Panel:  Recent Labs  01/19/17 0937  CHOL 138  HDL 59  LDLCALC 52  TRIG 134  CHOLHDL 2.3    Assessment/Plan 1. Senile osteoporosis - denosumab (PROLIA) injection 60 mg; Inject 60 mg into the skin once. Given today -next in December after 12/14 -cont ca with D, oddly plain D3 is not on her list now???  2. Oral thrush -will tx with nystatin, may also be cause of ulcer  3. Tongue ulcer -refer to ENT due to ulcer on tongue x 3 wks despite stopping toothpaste that she thinks caused it -if no better with nystatin, will need ENT to look at it  Labs/tests ordered:ENT Next appt:  03/27/2017  Dawn Convery L. Kynadie Yaun, D.O. [facility] [address], [place] [address] Cell Phone (Mon-Fri 8am-5pm):  [phone] On Call:  [phone] & follow prompts after 5pm & weekends Office Phone:  [phone] Office Fax:  [phone]

## 2017-02-08 NOTE — Progress Notes (Signed)
Pt came for her prolia injection and c/o whiteness of tongue and ulceration on the left side.

## 2017-02-13 ENCOUNTER — Telehealth: Payer: Self-pay | Admitting: Cardiology

## 2017-02-13 ENCOUNTER — Encounter: Payer: Medicare Other | Admitting: *Deleted

## 2017-02-13 DIAGNOSIS — M19011 Primary osteoarthritis, right shoulder: Secondary | ICD-10-CM | POA: Diagnosis not present

## 2017-02-13 NOTE — Telephone Encounter (Signed)
LMOVM reminding pt to send remote transmission.   

## 2017-02-17 ENCOUNTER — Encounter: Payer: Self-pay | Admitting: Cardiology

## 2017-02-17 DIAGNOSIS — J029 Acute pharyngitis, unspecified: Secondary | ICD-10-CM | POA: Diagnosis not present

## 2017-02-17 DIAGNOSIS — K140 Glossitis: Secondary | ICD-10-CM | POA: Diagnosis not present

## 2017-02-20 ENCOUNTER — Other Ambulatory Visit: Payer: Self-pay | Admitting: Internal Medicine

## 2017-02-24 ENCOUNTER — Telehealth: Payer: Self-pay | Admitting: Internal Medicine

## 2017-02-24 NOTE — Telephone Encounter (Signed)
Spoke w/ pt and informed her that we have not received her remote transmission. Attempted to help pt send a transmission. Transmission was unsuccessful. Instructed pt to call tech support. Pt verbalized understanding.

## 2017-02-24 NOTE — Telephone Encounter (Signed)
Mrs. Delmont is calling in regards to letter stating that you didn't get a reading from her device . Please call

## 2017-03-02 ENCOUNTER — Ambulatory Visit: Payer: Medicare Other | Admitting: Internal Medicine

## 2017-03-16 ENCOUNTER — Other Ambulatory Visit: Payer: Self-pay

## 2017-03-16 ENCOUNTER — Telehealth: Payer: Self-pay

## 2017-03-16 MED ORDER — SYNTHROID 125 MCG PO TABS: 125.0000 ug | ORAL_TABLET | Freq: Every day | ORAL | 1 refills | Status: DC

## 2017-03-16 NOTE — Telephone Encounter (Signed)
Patient stated that she is going to be using [facility] for her refills.

## 2017-03-16 NOTE — Telephone Encounter (Signed)
A fax was received from [facility] requesting a refill of synthroid 125 mcg. This pharmacy is not listed on patient's chart, so I called patient to see if she actually uses this pharmacy.  Left message for patient to call the office.

## 2017-03-16 NOTE — Telephone Encounter (Signed)
A refill request was received from [facility] for synthroid 125 mcg tablets. #30 with 3 RF.

## 2017-03-22 ENCOUNTER — Ambulatory Visit (INDEPENDENT_AMBULATORY_CARE_PROVIDER_SITE_OTHER): Payer: Medicare Other | Admitting: *Deleted

## 2017-03-22 DIAGNOSIS — I441 Atrioventricular block, second degree: Secondary | ICD-10-CM

## 2017-03-22 DIAGNOSIS — M19012 Primary osteoarthritis, left shoulder: Secondary | ICD-10-CM | POA: Diagnosis not present

## 2017-03-22 DIAGNOSIS — M7542 Impingement syndrome of left shoulder: Secondary | ICD-10-CM | POA: Diagnosis not present

## 2017-03-23 ENCOUNTER — Encounter: Payer: Self-pay | Admitting: Cardiology

## 2017-03-23 NOTE — Progress Notes (Signed)
Remote pacemaker transmission.   

## 2017-03-27 ENCOUNTER — Ambulatory Visit: Payer: Medicare Other | Admitting: Internal Medicine

## 2017-03-28 ENCOUNTER — Ambulatory Visit (INDEPENDENT_AMBULATORY_CARE_PROVIDER_SITE_OTHER): Payer: Medicare Other

## 2017-03-28 VITALS — BP 142/64 | HR 80 | Temp 98.1°F | Ht 64.0 in | Wt 139.0 lb

## 2017-03-28 DIAGNOSIS — Z0000 Encounter for general adult medical examination without abnormal findings: Secondary | ICD-10-CM

## 2017-03-28 MED ORDER — ZOSTER VAC RECOMB ADJUVANTED 50 MCG/0.5ML IM SUSR: 0.5000 mL | Freq: Once | INTRAMUSCULAR | 1 refills | Status: AC

## 2017-03-28 NOTE — Patient Instructions (Signed)
Destiny [name] , Thank you for taking time to come for your Medicare Wellness Visit. I appreciate your ongoing commitment to your health goals. Please review the following plan we discussed and let me know if I can assist you in the future.   Screening recommendations/referrals: Colonoscopy excluded over age 81 Mammogram excluded over age 62 Bone Density up to date Recommended yearly ophthalmology/optometry visit for glaucoma screening and checkup Recommended yearly dental visit for hygiene and checkup  Vaccinations: Influenza vaccine due 2018 fall season  Pneumococcal vaccine up to date  Tdap vaccine up to date, due 06/19/2022 Shingles vaccine prescription printed for you    Advanced directives: in chart   Conditions/risks identified: none  Next appointment: 03/29/2017 Dr. Mariea Clonts @ 8:30 am.    Preventive Care 65 Years and Older, Female Preventive care refers to lifestyle choices and visits with your health care provider that can promote health and wellness. What does preventive care include?  A yearly physical exam. This is also called an annual well check.  Dental exams once or twice a year.  Routine eye exams. Ask your health care provider how often you should have your eyes checked.  Personal lifestyle choices, including:  Daily care of your teeth and gums.  Regular physical activity.  Eating a healthy diet.  Avoiding tobacco and drug use.  Limiting alcohol use.  Practicing safe sex.  Taking low-dose aspirin every day.  Taking vitamin and mineral supplements as recommended by your health care provider. What happens during an annual well check? The services and screenings done by your health care provider during your annual well check will depend on your age, overall health, lifestyle risk factors, and family history of disease. Counseling  Your health care provider may ask you questions about your:  Alcohol use.  Tobacco use.  Drug use.  Emotional  well-being.  Home and relationship well-being.  Sexual activity.  Eating habits.  History of falls.  Memory and ability to understand (cognition).  Work and work Statistician.  Reproductive health. Screening  You may have the following tests or measurements:  Height, weight, and BMI.  Blood pressure.  Lipid and cholesterol levels. These may be checked every 5 years, or more frequently if you are over 36 years old.  Skin check.  Lung cancer screening. You may have this screening every year starting at age 58 if you have a 30-pack-year history of smoking and currently smoke or have quit within the past 15 years.  Fecal occult blood test (FOBT) of the stool. You may have this test every year starting at age 81.  Flexible sigmoidoscopy or colonoscopy. You may have a sigmoidoscopy every 5 years or a colonoscopy every 10 years starting at age 72.  Hepatitis C blood test.  Hepatitis B blood test.  Sexually transmitted disease (STD) testing.  Diabetes screening. This is done by checking your blood sugar (glucose) after you have not eaten for a while (fasting). You may have this done every 1-3 years.  Bone density scan. This is done to screen for osteoporosis. You may have this done starting at age 39.  Mammogram. This may be done every 1-2 years. Talk to your health care provider about how often you should have regular mammograms. Talk with your health care provider about your test results, treatment options, and if necessary, the need for more tests. Vaccines  Your health care provider may recommend certain vaccines, such as:  Influenza vaccine. This is recommended every year.  Tetanus, diphtheria, and acellular  pertussis (Tdap, Td) vaccine. You may need a Td booster every 10 years.  Zoster vaccine. You may need this after age 18.  Pneumococcal 13-valent conjugate (PCV13) vaccine. One dose is recommended after age 31.  Pneumococcal polysaccharide (PPSV23) vaccine. One  dose is recommended after age 58. Talk to your health care provider about which screenings and vaccines you need and how often you need them. This information is not intended to replace advice given to you by your health care provider. Make sure you discuss any questions you have with your health care provider. Document Released: 09/11/2015 Document Revised: 05/04/2016 Document Reviewed: 06/16/2015 Elsevier Interactive Patient Education  2017 [place] Prevention in the Home Falls can cause injuries. They can happen to people of all ages. There are many things you can do to make your home safe and to help prevent falls. What can I do on the outside of my home?  Regularly fix the edges of walkways and driveways and fix any cracks.  Remove anything that might make you trip as you walk through a door, such as a raised step or threshold.  Trim any bushes or trees on the path to your home.  Use bright outdoor lighting.  Clear any walking paths of anything that might make someone trip, such as rocks or tools.  Regularly check to see if handrails are loose or broken. Make sure that both sides of any steps have handrails.  Any raised decks and porches should have guardrails on the edges.  Have any leaves, snow, or ice cleared regularly.  Use sand or salt on walking paths during winter.  Clean up any spills in your garage right away. This includes oil or grease spills. What can I do in the bathroom?  Use night lights.  Install grab bars by the toilet and in the tub and shower. Do not use towel bars as grab bars.  Use non-skid mats or decals in the tub or shower.  If you need to sit down in the shower, use a plastic, non-slip stool.  Keep the floor dry. Clean up any water that spills on the floor as soon as it happens.  Remove soap buildup in the tub or shower regularly.  Attach bath mats securely with double-sided non-slip rug tape.  Do not have throw rugs and other  things on the floor that can make you trip. What can I do in the bedroom?  Use night lights.  Make sure that you have a light by your bed that is easy to reach.  Do not use any sheets or blankets that are too big for your bed. They should not hang down onto the floor.  Have a firm chair that has side arms. You can use this for support while you get dressed.  Do not have throw rugs and other things on the floor that can make you trip. What can I do in the kitchen?  Clean up any spills right away.  Avoid walking on wet floors.  Keep items that you use a lot in easy-to-reach places.  If you need to reach something above you, use a strong step stool that has a grab bar.  Keep electrical cords out of the way.  Do not use floor polish or wax that makes floors slippery. If you must use wax, use non-skid floor wax.  Do not have throw rugs and other things on the floor that can make you trip. What can I do with my stairs?  Do not leave any items on the stairs.  Make sure that there are handrails on both sides of the stairs and use them. Fix handrails that are broken or loose. Make sure that handrails are as long as the stairways.  Check any carpeting to make sure that it is firmly attached to the stairs. Fix any carpet that is loose or worn.  Avoid having throw rugs at the top or bottom of the stairs. If you do have throw rugs, attach them to the floor with carpet tape.  Make sure that you have a light switch at the top of the stairs and the bottom of the stairs. If you do not have them, ask someone to add them for you. What else can I do to help prevent falls?  Wear shoes that:  Do not have high heels.  Have rubber bottoms.  Are comfortable and fit you well.  Are closed at the toe. Do not wear sandals.  If you use a stepladder:  Make sure that it is fully opened. Do not climb a closed stepladder.  Make sure that both sides of the stepladder are locked into place.  Ask  someone to hold it for you, if possible.  Clearly mark and make sure that you can see:  Any grab bars or handrails.  First and last steps.  Where the edge of each step is.  Use tools that help you move around (mobility aids) if they are needed. These include:  Canes.  Walkers.  Scooters.  Crutches.  Turn on the lights when you go into a dark area. Replace any light bulbs as soon as they burn out.  Set up your furniture so you have a clear path. Avoid moving your furniture around.  If any of your floors are uneven, fix them.  If there are any pets around you, be aware of where they are.  Review your medicines with your doctor. Some medicines can make you feel dizzy. This can increase your chance of falling. Ask your doctor what other things that you can do to help prevent falls. This information is not intended to replace advice given to you by your health care provider. Make sure you discuss any questions you have with your health care provider. Document Released: 06/11/2009 Document Revised: 01/21/2016 Document Reviewed: 09/19/2014 Elsevier Interactive Patient Education  2017 [employer].

## 2017-03-28 NOTE — Progress Notes (Signed)
Subjective:   Destiny [name] is a 83 y.o. female who presents for Medicare Annual (Subsequent) preventive examination.   Last AWV 09/02/2015    Objective:     Vitals: BP (!) 142/64 (BP Location: Right Arm, Patient Position: Sitting)   Pulse 80   Temp 98.1 F (36.7 C) (Oral)   Ht 5\' 4"  (1.626 m)   Wt 139 lb (63 kg)   SpO2 96%   BMI 23.86 kg/m   Body mass index is 23.86 kg/m.   Tobacco History  Smoking Status  . Never Smoker  Smokeless Tobacco  . Never Used     Counseling given: Not Answered   Past Medical History:  Diagnosis Date  . Allergy   . Arthritis   . 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 Cataract   . Chronic anxiety   . Constipation   . Diverticulosis of sigmoid colon   . Emphysema of lung ([place])   . GERD (gastroesophageal reflux disease)   . Glaucoma   . H/O adenomatous polyp of colon    Dr. Cristina Gong  . High cholesterol   . Hypertension   . Hypothyroidism   . Insomnia   . Lactose intolerance   . Lumbar degenerative disc disease   . Onychomycosis   . Osteoarthritis   . Osteoporosis   . Second degree Mobitz II AV block    s/p PPM  . Spinal stenosis   . Thyroid disease   . Varicose veins    Past Surgical History:  Procedure Laterality Date  . ABDOMINAL HYSTERECTOMY    . BLADDER REPAIR    . blephoplasty bilateral  03/10/2014  . CATARACT EXTRACTION W/ INTRAOCULAR LENS  IMPLANT, BILATERAL  7 & 8 2014  . CHOLECYSTECTOMY    . COLONOSCOPY  05/18/2012  . EP IMPLANTABLE DEVICE N/A 08/16/2016   MDT Advisa MRI conditional PPM implanted by Dr Rayann Heman for mobitz II second degree AV block  . EYE SURGERY    . herniated disc repair    . INCONTINENCE SURGERY    . JOINT REPLACEMENT    . LAMINECTOMY  02/2011   L3-L5  Dr. Ellene Route  . LUMBAR LAMINECTOMY/DECOMPRESSION MICRODISCECTOMY Right 09/21/2015   Procedure: Right Lumbar Three-FourMicrodiskectomy;  Surgeon: Kristeen Miss, MD;  Location: [facility];   Service: Neurosurgery;  Laterality: Right;  Right L3-4 Microdiskectomy  . SHOULDER ARTHROSCOPY WITH ROTATOR CUFF REPAIR Right 08/2003  . TONSILLECTOMY    . TOTAL HIP ARTHROPLASTY     right 1999   Family History  Problem Relation Age of Onset  . Arthritis Mother   . Osteoporosis Mother   . Heart attack Father    History  Sexual Activity  . Sexual activity: No    Outpatient Encounter Prescriptions as of 03/28/2017  Medication Sig  . Calcium-Phosphorus-Vitamin D (CITRACAL +D3) 250-107-500 MG-MG-UNIT CHEW Chew 2 tablets by mouth daily.   . diclofenac sodium (VOLTAREN) 1 % GEL Apply 2 g topically 4 (four) times daily.  Marland Kitchen diltiazem (CARDIZEM CD) 240 MG 24 hr capsule Take 240 mg by mouth daily.   . dorzolamide-timolol (COSOPT) 22.3-6.8 MG/ML ophthalmic solution Place 1 drop into both eyes 2 (two) times daily.  Marland Kitchen ipratropium (ATROVENT) 0.06 % nasal spray as directed. Instill 2 sprays into each nostril three times a day if needed.  . irbesartan-hydrochlorothiazide (AVALIDE) 300-12.5 MG tablet take 1 tablet by mouth once daily  . LACTASE PO Take 1 tablet by mouth as  needed (when eating dairy products). Take as directed  . Multiple Vitamins-Minerals (MULTIVITAMIN WITH MINERALS) tablet Take 1 tablet by mouth daily.  Marland Kitchen nystatin (MYCOSTATIN) 100000 UNIT/ML suspension Take 5 mLs (500,000 Units total) by mouth 4 (four) times daily.  . rosuvastatin (CRESTOR) 10 MG tablet Take 10 mg by mouth daily. Take 1/2 tablet daily for cholesterol  . SYNTHROID 125 MCG tablet Take 1 tablet (125 mcg total) by mouth daily.  Marland Kitchen Zoster Vac Recomb Adjuvanted [facility]) injection Inject 0.5 mLs into the muscle once.  . [DISCONTINUED] rosuvastatin (CRESTOR) 10 MG tablet Take 1 tablet (10 mg total) by mouth daily.   No facility-administered encounter medications on file as of 03/28/2017.     Activities of Daily Living In your present state of health, do you have any difficulty performing the following activities:  03/28/2017 08/15/2016  Hearing? N -  Vision? N -  Difficulty concentrating or making decisions? N -  Walking or climbing stairs? Y -  Comment knee pain  -  Dressing or bathing? N -  Doing errands, shopping? N N  Preparing Food and eating ? N -  Using the Toilet? N -  In the past six months, have you accidently leaked urine? Y -  Comment only when coughing and sneezing  -  Do you have problems with loss of bowel control? N -  Managing your Medications? N -  Managing your Finances? N -  Housekeeping or managing your Housekeeping? N -  Some recent data might be hidden    Patient Care Team: Gayland Curry, DO as PCP - General (Geriatric Medicine) Deland Pretty, MD (Internal Medicine) Ronald Lobo, MD as Consulting Physician (Gastroenterology) Jari Pigg, MD as Consulting Physician (Dermatology) Shon Hough, MD as Consulting Physician (Ophthalmology) Kristeen Miss, MD as Consulting Physician (Neurosurgery) Justice Britain, MD as Consulting Physician (Orthopedic Surgery) Gaynelle Arabian, MD as Consulting Physician (Orthopedic Surgery) Suella Broad, MD as Consulting Physician (Physical Medicine and Rehabilitation)    Assessment:     Exercise Activities and Dietary recommendations Current Exercise Habits: Home exercise routine, Type of exercise: Other - see comments (water aerobics), Time (Minutes): 60, Frequency (Times/Week): 3, Weekly Exercise (Minutes/Week): 180, Intensity: Mild, Exercise limited by: None identified  Goals    . Increase water intake          Patient will try to increase water intake      Fall Risk Fall Risk  03/28/2017 05/04/2016 02/03/2016 07/01/2015  Falls in the past year? No No No No   Depression Screen PHQ 2/9 Scores 03/28/2017 05/04/2016 02/03/2016 07/01/2015  PHQ - 2 Score 0 0 0 0     Cognitive Function MMSE - Mini Mental State Exam 03/28/2017  Orientation to time 5  Orientation to Place 5  Registration 3  Attention/ Calculation 5  Recall 3    Language- name 2 objects 2  Language- repeat 1  Language- follow 3 step command 3  Language- read & follow direction 1  Write a sentence 1  Copy design 1  Total score 30        Immunization History  Administered Date(s) Administered  . DTaP 06/19/2012  . Influenza,inj,Quad PF,36+ Mos 05/12/2016  . Influenza-Unspecified 08/29/2014  . Pneumococcal Polysaccharide-23 12/11/1999  . Pneumococcal-Unspecified 08/29/2013  . Zoster 06/24/2006   Screening Tests Health Maintenance  Topic Date Due  . PNA vac Low Risk Adult (2 of 2 - PCV13) 08/29/2014  . INFLUENZA VACCINE  03/29/2017  . TETANUS/TDAP  06/19/2022  . DEXA SCAN  Completed      Plan:     I have personally reviewed and addressed the Medicare Annual Wellness questionnaire and have noted the following in the patient's chart:  A. Medical and social history B. Use of alcohol, tobacco or illicit drugs  C. Current medications and supplements D. Functional ability and status E.  Nutritional status F.  Physical activity G. Advance directives H. List of other physicians I.  Hospitalizations, surgeries, and ER visits in previous 12 months J.  [place] to include hearing, vision, cognitive, depression L. Referrals and appointments - none  In addition, I have reviewed and discussed with patient certain preventive protocols, quality metrics, and best practice recommendations. A written personalized care plan for preventive services as well as general preventive health recommendations were provided to patient.  See attached scanned questionnaire for additional information.   Signed,   Rich Reining, RN Nurse Health Advisor  Quick Notes   Health Maintenance: shingrix prescription printed     Abnormal Screen: MMSE 30, did not pass clock,      Patient Concerns: thrush, sinus problems     Nurse Concerns: none

## 2017-03-29 ENCOUNTER — Non-Acute Institutional Stay: Payer: Medicare Other | Admitting: Internal Medicine

## 2017-03-29 ENCOUNTER — Encounter: Payer: Self-pay | Admitting: Internal Medicine

## 2017-03-29 VITALS — BP 140/70 | HR 78 | Temp 98.7°F | Wt 139.0 lb

## 2017-03-29 DIAGNOSIS — M7552 Bursitis of left shoulder: Secondary | ICD-10-CM | POA: Diagnosis not present

## 2017-03-29 DIAGNOSIS — J321 Chronic frontal sinusitis: Secondary | ICD-10-CM

## 2017-03-29 DIAGNOSIS — K140 Glossitis: Secondary | ICD-10-CM

## 2017-03-29 MED ORDER — FLUTICASONE PROPIONATE 50 MCG/ACT NA SUSP: 2.0000 | Freq: Every day | NASAL | 1 refills | Status: DC

## 2017-03-29 NOTE — Progress Notes (Signed)
Location:  [facility] of Service:  Clinic (12)  Provider: Jasaiah Karwowski L. Mariea Clonts, D.O., C.M.D.  Code Status: DNR Goals of Care:  Advanced Directives 03/29/2017  Does Patient Have a Medical Advance Directive? Yes  Type of Paramedic of [place];Living will  Does patient want to make changes to medical advance directive? -  Copy of [place] in Chart? Yes   Chief Complaint  Patient presents with  . Acute Visit    sinus infection    HPI: Patient is a 83 y.o. female seen today for an acute visit for possible sinus infection, continued tongue problem and shoulder bursitis.   Keeps sinus congestion always, but does not typically feel terrible.  Did feel bad over the weekend and had a fever Monday with temp 99 when she saw [facility].  She was spitting up thick brown mucus--now just yellow today.  Headache over her eyes and around her nose.  Has deviated septum that's never been repaired.  Takes the ipratropium bromide spray and saline spray.  Has had cough from drainage and used delsym.  Uses steam from tea kettle.  No sick contacts.    Has used zyrtec in the past and not sure it still works.  Has gotten zpak abx before.  We discussed temporary use of flonase and claritin for the next few weeks until she gets through this.  Is going to try to get shingrix at [facility].    Left shoulder bursitis-had injection with some improvement.  Shoulder replacement advised, but she's concerned that she is too old for this.  She is trying to use her right arm more which is hard b/c she is left-handed.    She continues to have a sore area on her tongue and sore mouth.  She saw ENT and was advised that this was likely due to "killing off good bacteria from antibiotics" so she was advised to take probiotics and eat yogurt.  She has and still has the bothersome area on the left side of her tongue which has a geographic tongue appearance.    Past Medical History:    Diagnosis Date  . Allergy   . Arthritis   . 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 Cataract   . Chronic anxiety   . Constipation   . Diverticulosis of sigmoid colon   . Emphysema of lung ([place])   . GERD (gastroesophageal reflux disease)   . Glaucoma   . H/O adenomatous polyp of colon    Dr. Cristina Gong  . High cholesterol   . Hypertension   . Hypothyroidism   . Insomnia   . Lactose intolerance   . Lumbar degenerative disc disease   . Onychomycosis   . Osteoarthritis   . Osteoporosis   . Second degree Mobitz II AV block    s/p PPM  . Spinal stenosis   . Thyroid disease   . Varicose veins     Past Surgical History:  Procedure Laterality Date  . ABDOMINAL HYSTERECTOMY    . BLADDER REPAIR    . blephoplasty bilateral  03/10/2014  . CATARACT EXTRACTION W/ INTRAOCULAR LENS  IMPLANT, BILATERAL  7 & 8 2014  . CHOLECYSTECTOMY    . COLONOSCOPY  05/18/2012  . EP IMPLANTABLE DEVICE N/A 08/16/2016   MDT Advisa MRI conditional PPM implanted by Dr Rayann Heman for mobitz II second degree AV block  . EYE SURGERY    .  herniated disc repair    . INCONTINENCE SURGERY    . JOINT REPLACEMENT    . LAMINECTOMY  02/2011   L3-L5  Dr. Ellene Route  . LUMBAR LAMINECTOMY/DECOMPRESSION MICRODISCECTOMY Right 09/21/2015   Procedure: Right Lumbar Three-FourMicrodiskectomy;  Surgeon: Kristeen Miss, MD;  Location: [facility];  Service: Neurosurgery;  Laterality: Right;  Right L3-4 Microdiskectomy  . SHOULDER ARTHROSCOPY WITH ROTATOR CUFF REPAIR Right 08/2003  . TONSILLECTOMY    . TOTAL HIP ARTHROPLASTY     right 1999    Allergies  Allergen Reactions  . Codeine Nausea Only  . Macrobid [Nitrofurantoin] Diarrhea  . Miralax [Polyethylene Glycol] Other (See Comments)    cramping  . Pain Relief [Acetaminophen] Nausea And Vomiting  . Paxil [Paroxetine Hcl] Nausea Only  . Percodan [Oxycodone-Aspirin] Itching  . Oxycodone Nausea Only  . Oxycodone-Acetaminophen  Nausea Only  . Penicillins Rash    Has patient had a PCN reaction causing immediate rash, facial/tongue/throat swelling, SOB or lightheadedness with hypotension: YES Has patient had a PCN reaction causing severe rash involving mucus membranes or skin necrosis: NO Has patient had a PCN reaction that required hospitalization NO Has patient had a PCN reaction occurring within the last 10 years: NO If all of the above answers are "NO", then may proceed with Cephalosporin use.    Allergies as of 03/29/2017      Reactions   Codeine Nausea Only   Macrobid [nitrofurantoin] Diarrhea   Miralax [polyethylene Glycol] Other (See Comments)   cramping   Pain Relief [acetaminophen] Nausea And Vomiting   Paxil [paroxetine Hcl] Nausea Only   Percodan [oxycodone-aspirin] Itching   Oxycodone Nausea Only   Oxycodone-acetaminophen Nausea Only   Penicillins Rash   Has patient had a PCN reaction causing immediate rash, facial/tongue/throat swelling, SOB or lightheadedness with hypotension: YES Has patient had a PCN reaction causing severe rash involving mucus membranes or skin necrosis: NO Has patient had a PCN reaction that required hospitalization NO Has patient had a PCN reaction occurring within the last 10 years: NO If all of the above answers are "NO", then may proceed with Cephalosporin use.      Medication List       Accurate as of 03/29/17  8:43 AM. Always use your most recent med list.          ALIGN 4 MG Caps Take by mouth daily.   CITRACAL +D3 250-107-500 MG-MG-UNIT Chew Generic drug:  Calcium-Phosphorus-Vitamin D Chew 2 tablets by mouth daily.   diclofenac sodium 1 % Gel Commonly known as:  VOLTAREN Apply 2 g topically 4 (four) times daily.   diltiazem 240 MG 24 hr capsule Commonly known as:  CARDIZEM CD Take 240 mg by mouth daily.   dorzolamide-timolol 22.3-6.8 MG/ML ophthalmic solution Commonly known as:  COSOPT Place 1 drop into both eyes 2 (two) times daily.   ipratropium  0.06 % nasal spray Commonly known as:  ATROVENT as directed. Instill 2 sprays into each nostril three times a day if needed.   irbesartan-hydrochlorothiazide 300-12.5 MG tablet Commonly known as:  AVALIDE take 1 tablet by mouth once daily   LACTASE PO Take 1 tablet by mouth as needed (when eating dairy products). Take as directed   multivitamin with minerals tablet Take 1 tablet by mouth daily.   nystatin 100000 UNIT/ML suspension Commonly known as:  MYCOSTATIN Take 5 mLs (500,000 Units total) by mouth 4 (four) times daily.   rosuvastatin 10 MG tablet Commonly known as:  CRESTOR Take 10 mg  by mouth daily. Take 1/2 tablet daily for cholesterol   SYNTHROID 125 MCG tablet Generic drug:  levothyroxine Take 1 tablet (125 mcg total) by mouth daily.       Review of Systems:  Review of Systems  Constitutional: Positive for malaise/fatigue. Negative for chills and fever.       Low grade temp two days ago, none since  HENT: Positive for congestion and sinus pain. Negative for ear pain and sore throat.   Eyes: Negative for blurred vision.       Has glaucoma so cannot take many antihistamines  Respiratory: Positive for cough and sputum production. Negative for shortness of breath.   Cardiovascular: Negative for chest pain, palpitations and leg swelling.  Gastrointestinal: Negative for abdominal pain, blood in stool, constipation, heartburn and melena.  Musculoskeletal: Negative for falls.  Skin: Negative for itching and rash.  Neurological: Negative for dizziness and loss of consciousness.  Endo/Heme/Allergies: Does not bruise/bleed easily.  Psychiatric/Behavioral: Negative for depression and memory loss.    Health Maintenance  Topic Date Due  . PNA vac Low Risk Adult (2 of 2 - PCV13) 08/29/2014  . INFLUENZA VACCINE  03/29/2017  . TETANUS/TDAP  06/19/2022  . DEXA SCAN  Completed    Physical Exam: Vitals:   03/29/17 0834  BP: 140/70  Pulse: 78  Temp: 98.7 F (37.1 C)    TempSrc: Oral  SpO2: 96%  Weight: 139 lb (63 kg)   Body mass index is 23.86 kg/m. Physical Exam  Constitutional: She is oriented to person, place, and time. She appears well-developed and well-nourished. No distress.  HENT:  Head: Normocephalic and atraumatic.  Right Ear: External ear normal.  Left Ear: External ear normal.  Nose: Nose normal.  Mouth/Throat: Oropharynx is clear and moist. No oropharyngeal exudate.  Left side of tongue has geographic appearance, red area and sore  Eyes: Pupils are equal, round, and reactive to light. Conjunctivae and EOM are normal.  Neck: Neck supple.  Cardiovascular: Normal rate, regular rhythm, normal heart sounds and intact distal pulses.   Pulmonary/Chest: Effort normal and breath sounds normal. No respiratory distress. She has no wheezes.  Musculoskeletal: Normal range of motion.  Lymphadenopathy:    She has no cervical adenopathy.  Neurological: She is alert and oriented to person, place, and time.  Skin: Skin is warm and dry.  Psychiatric: She has a normal mood and affect.    Labs reviewed: Basic Metabolic Panel:  Recent Labs  08/15/16 1225  08/17/16 0202 09/12/16 1058 01/19/17 0937  NA 143  < > 140 141 143  K 3.8  < > 4.0 4.1 4.0  CL 109  < > 110 106 108  CO2 26  < > 26 26 25   GLUCOSE 121*  < > 98 109* 101*  BUN 17  < > 13 18 15   CREATININE 0.84  < > 1.02* 0.74 0.61  CALCIUM 10.4*  < > 9.3 10.1 9.5  MG 2.1  --   --   --   --   TSH 4.876*  --   --   --  0.90  < > = values in this interval not displayed. Liver Function Tests:  Recent Labs  05/12/16 1345 08/17/16 0202 01/19/17 0937  AST 18 22 17   ALT 11 16 12   ALKPHOS 59 52 71  BILITOT 0.6 1.0 0.8  PROT 6.1 5.2* 5.9*  ALBUMIN 4.3 3.5 3.9   No results for input(s): LIPASE, AMYLASE in the last 8760 hours. No results  for input(s): AMMONIA in the last 8760 hours. CBC:  Recent Labs  05/12/16 1345 08/15/16 1225 08/16/16 0226 08/17/16 0202 01/19/17 0937  WBC 6.8  8.2 7.6 8.3 6.6  NEUTROABS 4,216 5.6  --   --  3,960  HGB 12.5 13.2 11.8* 12.4 12.4  HCT 38.5 40.8 36.2 39.3 37.6  MCV 91.0 92.5 92.1 93.3 89.5  PLT 265 218 215 209 263   Lipid Panel:  Recent Labs  01/19/17 0937  CHOL 138  HDL 59  LDLCALC 52  TRIG 134  CHOLHDL 2.3   Assessment/Plan 1. Chronic frontal sinusitis - fluticasone (FLONASE) 50 MCG/ACT nasal spray; Place 2 sprays into both nostrils daily.  Dispense: 16 g; Refill: 1 - cont ipratropium, saline, take claritin, flonase short term  - take zpak if fever returns and she continues to feel really bad  2. Tongue ulcer -cont probiotics and yogurt, mouthwash I prescribed her  3. Chronic bursitis of left shoulder -s/p injection, cont use of right arm, replacement was recommended by ortho but she does not think it's a good idea at her age--we did review that therapy is long and intense for this  Labs/tests ordered:  No new Next appt:  Cancel physical (medicare does not pay for physical) and schedule routine for 4 mos  Klynn Linnemann L. Adar Rase, D.O. [facility] [address], [place] [address] Cell Phone (Mon-Fri 8am-5pm):  [phone] On Call:  [phone] & follow prompts after 5pm & weekends Office Phone:  [phone] Office Fax:  [phone]

## 2017-04-05 ENCOUNTER — Encounter: Payer: Medicare Other | Admitting: Internal Medicine

## 2017-04-10 ENCOUNTER — Telehealth: Payer: Self-pay | Admitting: *Deleted

## 2017-04-10 DIAGNOSIS — B370 Candidal stomatitis: Secondary | ICD-10-CM

## 2017-04-10 NOTE — Telephone Encounter (Signed)
Yes, ok to refill 

## 2017-04-10 NOTE — Telephone Encounter (Signed)
[facility] stated that patient is requesting a refill on her Nystatin Oral suspension. Is this ok to refill. Please Advise.

## 2017-04-11 MED ORDER — NYSTATIN 100000 UNIT/ML MT SUSP: 5.0000 mL | Freq: Four times a day (QID) | OROMUCOSAL | 0 refills | Status: DC

## 2017-04-11 NOTE — Telephone Encounter (Signed)
Refill faxed to pharmacy.

## 2017-04-21 LAB — CUP PACEART REMOTE DEVICE CHECK
Battery Remaining Longevity: 124 mo
Battery Voltage: 3.03 V
Brady Statistic AS VS Percent: 88.49 %
Brady Statistic RA Percent Paced: 11.21 %
Brady Statistic RV Percent Paced: 0.28 %
Date Time Interrogation Session: 20180725192914
Implantable Lead Implant Date: 20171219
Implantable Lead Implant Date: 20171219
Implantable Lead Location: 753860
Implantable Pulse Generator Implant Date: 20171219
Lead Channel Impedance Value: 380 Ohm
Lead Channel Impedance Value: 437 Ohm
Lead Channel Pacing Threshold Amplitude: 0.625 V
Lead Channel Pacing Threshold Amplitude: 1.75 V
Lead Channel Pacing Threshold Pulse Width: 0.4 ms
Lead Channel Sensing Intrinsic Amplitude: 2.875 mV
Lead Channel Setting Sensing Sensitivity: 2.8 mV
MDC IDC LEAD LOCATION: 753859
MDC IDC MSMT LEADCHNL RA IMPEDANCE VALUE: 323 Ohm
MDC IDC MSMT LEADCHNL RA SENSING INTR AMPL: 2.875 mV
MDC IDC MSMT LEADCHNL RV IMPEDANCE VALUE: 361 Ohm
MDC IDC MSMT LEADCHNL RV PACING THRESHOLD PULSEWIDTH: 0.4 ms
MDC IDC MSMT LEADCHNL RV SENSING INTR AMPL: 6.375 mV
MDC IDC MSMT LEADCHNL RV SENSING INTR AMPL: 6.375 mV
MDC IDC SET LEADCHNL RA PACING AMPLITUDE: 2 V
MDC IDC SET LEADCHNL RV PACING AMPLITUDE: 3.5 V
MDC IDC SET LEADCHNL RV PACING PULSEWIDTH: 0.4 ms
MDC IDC STAT BRADY AP VP PERCENT: 0.01 %
MDC IDC STAT BRADY AP VS PERCENT: 11.24 %
MDC IDC STAT BRADY AS VP PERCENT: 0.26 %

## 2017-05-24 ENCOUNTER — Encounter: Payer: Self-pay | Admitting: Internal Medicine

## 2017-05-24 ENCOUNTER — Non-Acute Institutional Stay: Payer: Medicare Other | Admitting: Internal Medicine

## 2017-05-24 VITALS — BP 120/70 | HR 76 | Temp 98.4°F | Wt 141.0 lb

## 2017-05-24 DIAGNOSIS — R531 Weakness: Secondary | ICD-10-CM | POA: Diagnosis not present

## 2017-05-24 DIAGNOSIS — K148 Other diseases of tongue: Secondary | ICD-10-CM

## 2017-05-24 NOTE — Progress Notes (Signed)
Location:  [facility] of Service:  Clinic (12)  Provider: Kimia Finan L. Mariea Clonts, D.O., C.M.D.  Code Status: DNR Goals of Care:  Advanced Directives 05/24/2017  Does Patient Have a Medical Advance Directive? Yes  Type of Advance Directive [place]  Does patient want to make changes to medical advance directive? -  Copy of [place] in Chart? Yes   Chief Complaint  Patient presents with  . Acute Visit    problem with tongue    HPI: Patient is a 83 y.o. female seen today for an acute visit for a problem with her tongue which has been going on for several months.  Was red and smooth in an area.  Not sore before.  Did nystatin, yogurt, probiotics in July, but none solved the problem.  Saw ENT, Dr. Lucia Gaskins who recommended the thrush treatment and she did not improve.  Tried taking B12 b/c she read that could be the issue and a PA told her last year before her heart problem that she might benefit from that.  Mouth is dry.  Drinking a lot of water.  No other new symptoms besides some pain there that was not there in June.  She appears a bit weaker and more difficulty getting up out of chair (says due to arthritis)  Past Medical History:  Diagnosis Date  . Allergy   . Arthritis   . 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 Cataract   . Chronic anxiety   . Constipation   . Diverticulosis of sigmoid colon   . Emphysema of lung ([place])   . GERD (gastroesophageal reflux disease)   . Glaucoma   . H/O adenomatous polyp of colon    Dr. Cristina Gong  . High cholesterol   . Hypertension   . Hypothyroidism   . Insomnia   . Lactose intolerance   . Lumbar degenerative disc disease   . Onychomycosis   . Osteoarthritis   . Osteoporosis   . Second degree Mobitz II AV block    s/p PPM  . Spinal stenosis   . Thyroid disease   . Varicose veins     Past Surgical History:  Procedure Laterality Date  .  ABDOMINAL HYSTERECTOMY    . BLADDER REPAIR    . blephoplasty bilateral  03/10/2014  . CATARACT EXTRACTION W/ INTRAOCULAR LENS  IMPLANT, BILATERAL  7 & 8 2014  . CHOLECYSTECTOMY    . COLONOSCOPY  05/18/2012  . EP IMPLANTABLE DEVICE N/A 08/16/2016   MDT Advisa MRI conditional PPM implanted by Dr Rayann Heman for mobitz II second degree AV block  . EYE SURGERY    . herniated disc repair    . INCONTINENCE SURGERY    . JOINT REPLACEMENT    . LAMINECTOMY  02/2011   L3-L5  Dr. Ellene Route  . LUMBAR LAMINECTOMY/DECOMPRESSION MICRODISCECTOMY Right 09/21/2015   Procedure: Right Lumbar Three-FourMicrodiskectomy;  Surgeon: Kristeen Miss, MD;  Location: [facility];  Service: Neurosurgery;  Laterality: Right;  Right L3-4 Microdiskectomy  . SHOULDER ARTHROSCOPY WITH ROTATOR CUFF REPAIR Right 08/2003  . TONSILLECTOMY    . TOTAL HIP ARTHROPLASTY     right 1999    Allergies  Allergen Reactions  . Codeine Nausea Only  . Macrobid [Nitrofurantoin] Diarrhea  . Miralax [Polyethylene Glycol] Other (See Comments)    cramping  . Pain Relief [Acetaminophen] Nausea And Vomiting  . Paxil [Paroxetine Hcl] Nausea Only  .  Percodan [Oxycodone-Aspirin] Itching  . Oxycodone Nausea Only  . Oxycodone-Acetaminophen Nausea Only  . Penicillins Rash    Has patient had a PCN reaction causing immediate rash, facial/tongue/throat swelling, SOB or lightheadedness with hypotension: YES Has patient had a PCN reaction causing severe rash involving mucus membranes or skin necrosis: NO Has patient had a PCN reaction that required hospitalization NO Has patient had a PCN reaction occurring within the last 10 years: NO If all of the above answers are "NO", then may proceed with Cephalosporin use.    Outpatient Encounter Prescriptions as of 05/24/2017  Medication Sig  . Calcium-Phosphorus-Vitamin D (CITRACAL +D3) 250-107-500 MG-MG-UNIT CHEW Chew 2 tablets by mouth daily.   . diclofenac sodium (VOLTAREN) 1 % GEL Apply 2 g topically 4 (four)  times daily.  Marland Kitchen diltiazem (CARDIZEM CD) 240 MG 24 hr capsule Take 240 mg by mouth daily.   . dorzolamide-timolol (COSOPT) 22.3-6.8 MG/ML ophthalmic solution Place 1 drop into both eyes 2 (two) times daily.  . fluticasone (FLONASE) 50 MCG/ACT nasal spray Place 2 sprays into both nostrils daily.  Marland Kitchen ipratropium (ATROVENT) 0.06 % nasal spray as directed. Instill 2 sprays into each nostril three times a day if needed.  . irbesartan-hydrochlorothiazide (AVALIDE) 300-12.5 MG tablet take 1 tablet by mouth once daily  . LACTASE PO Take 1 tablet by mouth as needed (when eating dairy products). Take as directed  . Multiple Vitamins-Minerals (MULTIVITAMIN WITH MINERALS) tablet Take 1 tablet by mouth daily.  Marland Kitchen nystatin (MYCOSTATIN) 100000 UNIT/ML suspension Take 5 mLs (500,000 Units total) by mouth 4 (four) times daily.  . Probiotic Product (ALIGN) 4 MG CAPS Take by mouth daily.  . rosuvastatin (CRESTOR) 10 MG tablet Take 10 mg by mouth daily. Take 1/2 tablet daily for cholesterol  . SYNTHROID 125 MCG tablet Take 1 tablet (125 mcg total) by mouth daily.   No facility-administered encounter medications on file as of 05/24/2017.     Review of Systems:  Review of Systems  Constitutional: Negative for chills, fever and malaise/fatigue.  HENT: Negative for congestion.   Eyes: Negative for blurred vision.  Respiratory: Negative for shortness of breath.   Cardiovascular: Negative for chest pain and palpitations.  Gastrointestinal: Negative for abdominal pain.  Genitourinary: Negative for dysuria.  Skin: Negative for itching and rash.  Neurological: Positive for weakness. Negative for dizziness.  Psychiatric/Behavioral: Negative for memory loss. The patient is nervous/anxious.     Health Maintenance  Topic Date Due  . PNA vac Low Risk Adult (2 of 2 - PCV13) 08/29/2014  . INFLUENZA VACCINE  03/29/2017  . TETANUS/TDAP  06/19/2022  . DEXA SCAN  Completed    Physical Exam: Vitals:   05/24/17 1058  BP:  120/70  Pulse: 76  Temp: 98.4 F (36.9 C)  TempSrc: Oral  SpO2: 96%  Weight: 141 lb (64 kg)   Body mass index is 24.2 kg/m. Physical Exam  Constitutional: She is oriented to person, place, and time. She appears well-developed and well-nourished.  HENT:  Head: Normocephalic and atraumatic.  Nose: Nose normal.  Mouth/Throat: No oropharyngeal exudate.  Left side of tongue with loss of papillae, geographic type look in that area, now tender  Neck: Trachea normal. Neck supple. No JVD present. No thyroid mass and no thyromegaly present.  Pulmonary/Chest: Effort normal and breath sounds normal.  Lymphadenopathy:       Head (right side): No submental, no submandibular, no tonsillar, no preauricular, no posterior auricular and no occipital adenopathy present.  Head (left side): No submental, no submandibular, no tonsillar, no preauricular, no posterior auricular and no occipital adenopathy present.    She has no cervical adenopathy.       Right cervical: No superficial cervical, no deep cervical and no posterior cervical adenopathy present.      Left cervical: No superficial cervical, no deep cervical and no posterior cervical adenopathy present.       Right: No supraclavicular adenopathy present.       Left: No supraclavicular adenopathy present.  Neurological: She is alert and oriented to person, place, and time.  Skin: Skin is warm and dry. There is pallor.  Psychiatric: She has a normal mood and affect.    Labs reviewed: Basic Metabolic Panel:  Recent Labs  08/15/16 1225  08/17/16 0202 09/12/16 1058 01/19/17 0937  NA 143  < > 140 141 143  K 3.8  < > 4.0 4.1 4.0  CL 109  < > 110 106 108  CO2 26  < > 26 26 25   GLUCOSE 121*  < > 98 109* 101*  BUN 17  < > 13 18 15   CREATININE 0.84  < > 1.02* 0.74 0.61  CALCIUM 10.4*  < > 9.3 10.1 9.5  MG 2.1  --   --   --   --   TSH 4.876*  --   --   --  0.90  < > = values in this interval not displayed. Liver Function Tests:  Recent  Labs  08/17/16 0202 01/19/17 0937  AST 22 17  ALT 16 12  ALKPHOS 52 71  BILITOT 1.0 0.8  PROT 5.2* 5.9*  ALBUMIN 3.5 3.9   No results for input(s): LIPASE, AMYLASE in the last 8760 hours. No results for input(s): AMMONIA in the last 8760 hours. CBC:  Recent Labs  08/15/16 1225 08/16/16 0226 08/17/16 0202 01/19/17 0937  WBC 8.2 7.6 8.3 6.6  NEUTROABS 5.6  --   --  3,960  HGB 13.2 11.8* 12.4 12.4  HCT 40.8 36.2 39.3 37.6  MCV 92.5 92.1 93.3 89.5  PLT 218 215 209 263   Lipid Panel:  Recent Labs  01/19/17 0937  CHOL 138  HDL 59  LDLCALC 52  TRIG 134  CHOLHDL 2.3   Assessment/Plan 1. Tongue lesion -has not responded to treatment for thrush -I'm bothered by the prolonged duration of this lesion and it's now painful -doubt this is from a vitamin deficiency or syphilis (lacks risk factors), but also has never smoked making oral cancer less likely, also no lymphadenopathy present -check labs below and if unremarkable, I've asked her to return to ENT, Dr. Lucia Gaskins  2. General weakness -mild, seems more related to arthritis of knees when standing  Labs/tests ordered:  Iron panel, b12/folate, RPR, Vitamin D at 8am here tomorrow  Next appt:  08/02/2017--keep scheduled appt  Alic Hilburn L. Tonnia Bardin, D.O. [facility] [address], [place] [address] Cell Phone (Mon-Fri 8am-5pm):  [phone] On Call:  [phone] & follow prompts after 5pm & weekends Office Phone:  [phone] Office Fax:  [phone]

## 2017-05-24 NOTE — Addendum Note (Signed)
Addended by: Hollace Kinnier L on: 05/24/2017 12:18 PM   Modules accepted: Level of Service

## 2017-05-25 ENCOUNTER — Encounter: Payer: Self-pay | Admitting: Internal Medicine

## 2017-05-25 DIAGNOSIS — D649 Anemia, unspecified: Secondary | ICD-10-CM | POA: Diagnosis not present

## 2017-05-25 DIAGNOSIS — E559 Vitamin D deficiency, unspecified: Secondary | ICD-10-CM | POA: Diagnosis not present

## 2017-05-25 DIAGNOSIS — K148 Other diseases of tongue: Secondary | ICD-10-CM | POA: Diagnosis not present

## 2017-05-25 DIAGNOSIS — R531 Weakness: Secondary | ICD-10-CM | POA: Diagnosis not present

## 2017-05-25 DIAGNOSIS — D519 Vitamin B12 deficiency anemia, unspecified: Secondary | ICD-10-CM | POA: Diagnosis not present

## 2017-05-25 LAB — VITAMIN D 25 HYDROXY (VIT D DEFICIENCY, FRACTURES): VIT D 25 HYDROXY: 48.48

## 2017-05-31 DIAGNOSIS — H401113 Primary open-angle glaucoma, right eye, severe stage: Secondary | ICD-10-CM | POA: Diagnosis not present

## 2017-05-31 DIAGNOSIS — Z961 Presence of intraocular lens: Secondary | ICD-10-CM | POA: Diagnosis not present

## 2017-05-31 DIAGNOSIS — H401122 Primary open-angle glaucoma, left eye, moderate stage: Secondary | ICD-10-CM | POA: Diagnosis not present

## 2017-06-14 DIAGNOSIS — K140 Glossitis: Secondary | ICD-10-CM | POA: Diagnosis not present

## 2017-06-21 ENCOUNTER — Ambulatory Visit (INDEPENDENT_AMBULATORY_CARE_PROVIDER_SITE_OTHER): Payer: Medicare Other | Admitting: *Deleted

## 2017-06-21 DIAGNOSIS — I441 Atrioventricular block, second degree: Secondary | ICD-10-CM | POA: Diagnosis not present

## 2017-06-21 NOTE — Progress Notes (Signed)
Remote pacemaker transmission.   

## 2017-06-22 DIAGNOSIS — Z23 Encounter for immunization: Secondary | ICD-10-CM | POA: Diagnosis not present

## 2017-06-23 ENCOUNTER — Encounter: Payer: Self-pay | Admitting: Cardiology

## 2017-06-23 LAB — CUP PACEART REMOTE DEVICE CHECK
Battery Remaining Longevity: 111 mo
Battery Voltage: 3.01 V
Brady Statistic AP VP Percent: 0.49 %
Brady Statistic RA Percent Paced: 6.84 %
Date Time Interrogation Session: 20181024152141
Implantable Lead Implant Date: 20171219
Implantable Lead Location: 753860
Implantable Lead Model: 5076
Implantable Pulse Generator Implant Date: 20171219
Lead Channel Impedance Value: 304 Ohm
Lead Channel Impedance Value: 342 Ohm
Lead Channel Impedance Value: 361 Ohm
Lead Channel Pacing Threshold Amplitude: 0.625 V
Lead Channel Pacing Threshold Pulse Width: 0.4 ms
Lead Channel Pacing Threshold Pulse Width: 0.4 ms
Lead Channel Sensing Intrinsic Amplitude: 3 mV
Lead Channel Setting Pacing Amplitude: 2 V
MDC IDC LEAD IMPLANT DT: 20171219
MDC IDC LEAD LOCATION: 753859
MDC IDC MSMT LEADCHNL RA SENSING INTR AMPL: 3 mV
MDC IDC MSMT LEADCHNL RV IMPEDANCE VALUE: 418 Ohm
MDC IDC MSMT LEADCHNL RV PACING THRESHOLD AMPLITUDE: 2 V
MDC IDC MSMT LEADCHNL RV SENSING INTR AMPL: 5.875 mV
MDC IDC MSMT LEADCHNL RV SENSING INTR AMPL: 5.875 mV
MDC IDC SET LEADCHNL RV PACING AMPLITUDE: 4 V
MDC IDC SET LEADCHNL RV PACING PULSEWIDTH: 0.4 ms
MDC IDC SET LEADCHNL RV SENSING SENSITIVITY: 2.8 mV
MDC IDC STAT BRADY AP VS PERCENT: 6.36 %
MDC IDC STAT BRADY AS VP PERCENT: 9.21 %
MDC IDC STAT BRADY AS VS PERCENT: 83.94 %
MDC IDC STAT BRADY RV PERCENT PACED: 9.69 %

## 2017-06-25 ENCOUNTER — Other Ambulatory Visit: Payer: Self-pay | Admitting: Internal Medicine

## 2017-06-27 DIAGNOSIS — L720 Epidermal cyst: Secondary | ICD-10-CM | POA: Diagnosis not present

## 2017-06-27 DIAGNOSIS — D2230 Melanocytic nevi of unspecified part of face: Secondary | ICD-10-CM | POA: Diagnosis not present

## 2017-06-27 DIAGNOSIS — L821 Other seborrheic keratosis: Secondary | ICD-10-CM | POA: Diagnosis not present

## 2017-06-27 DIAGNOSIS — Z23 Encounter for immunization: Secondary | ICD-10-CM | POA: Diagnosis not present

## 2017-06-27 DIAGNOSIS — D225 Melanocytic nevi of trunk: Secondary | ICD-10-CM | POA: Diagnosis not present

## 2017-06-27 DIAGNOSIS — L820 Inflamed seborrheic keratosis: Secondary | ICD-10-CM | POA: Diagnosis not present

## 2017-06-27 DIAGNOSIS — Z85828 Personal history of other malignant neoplasm of skin: Secondary | ICD-10-CM | POA: Diagnosis not present

## 2017-06-27 DIAGNOSIS — L570 Actinic keratosis: Secondary | ICD-10-CM | POA: Diagnosis not present

## 2017-07-03 DIAGNOSIS — Z961 Presence of intraocular lens: Secondary | ICD-10-CM | POA: Diagnosis not present

## 2017-07-03 DIAGNOSIS — H04123 Dry eye syndrome of bilateral lacrimal glands: Secondary | ICD-10-CM | POA: Diagnosis not present

## 2017-07-03 DIAGNOSIS — H401132 Primary open-angle glaucoma, bilateral, moderate stage: Secondary | ICD-10-CM | POA: Diagnosis not present

## 2017-07-03 DIAGNOSIS — T1512XA Foreign body in conjunctival sac, left eye, initial encounter: Secondary | ICD-10-CM | POA: Diagnosis not present

## 2017-07-12 DIAGNOSIS — G8929 Other chronic pain: Secondary | ICD-10-CM | POA: Diagnosis not present

## 2017-07-12 DIAGNOSIS — M19012 Primary osteoarthritis, left shoulder: Secondary | ICD-10-CM | POA: Diagnosis not present

## 2017-07-12 DIAGNOSIS — M25512 Pain in left shoulder: Secondary | ICD-10-CM | POA: Diagnosis not present

## 2017-07-25 ENCOUNTER — Other Ambulatory Visit: Payer: Self-pay | Admitting: Internal Medicine

## 2017-08-02 ENCOUNTER — Non-Acute Institutional Stay: Payer: Medicare Other | Admitting: Internal Medicine

## 2017-08-02 ENCOUNTER — Encounter: Payer: Self-pay | Admitting: Internal Medicine

## 2017-08-02 VITALS — BP 120/60 | HR 73 | Wt 139.0 lb

## 2017-08-02 DIAGNOSIS — G5701 Lesion of sciatic nerve, right lower limb: Secondary | ICD-10-CM

## 2017-08-02 DIAGNOSIS — J411 Mucopurulent chronic bronchitis: Secondary | ICD-10-CM | POA: Diagnosis not present

## 2017-08-02 DIAGNOSIS — I10 Essential (primary) hypertension: Secondary | ICD-10-CM

## 2017-08-02 DIAGNOSIS — M810 Age-related osteoporosis without current pathological fracture: Secondary | ICD-10-CM

## 2017-08-02 DIAGNOSIS — I441 Atrioventricular block, second degree: Secondary | ICD-10-CM

## 2017-08-02 DIAGNOSIS — E034 Atrophy of thyroid (acquired): Secondary | ICD-10-CM | POA: Diagnosis not present

## 2017-08-02 DIAGNOSIS — K141 Geographic tongue: Secondary | ICD-10-CM

## 2017-08-02 DIAGNOSIS — Z950 Presence of cardiac pacemaker: Secondary | ICD-10-CM

## 2017-08-02 NOTE — Progress Notes (Signed)
Location:  [facility] of Service:  Clinic (12)  Provider: Sharelle Burditt L. Mariea Clonts, D.O., C.M.D.  Code Status: DNR Goals of Care:  Advanced Directives 08/02/2017  Does Patient Have a Medical Advance Directive? Yes  Type of Advance Directive [place]  Does patient want to make changes to medical advance directive? No - Patient declined  Copy of [place] in Chart? Yes   Chief Complaint  Patient presents with  . Medical Management of Chronic Issues    73mth follow-up    HPI: Patient is a 83 y.o. female seen today for medical management of chronic diseases.    She has been told that she has geographic tongue.  Has chronic dry mouth.  Was told to come back to ENT if she has lumps form on her tongue.  No new things.    Says her back pain and sciatica are acting up on the right side. Nerve pain goes down leg each morning, but gets better with walking and exercise.  Not bad enough for tylenol even.  Has been doing her exercises from [place] in the water but not daily.    Has been to [facility] for her left shoulder that worked wonders and she's hoping it will last.  Breathing well.  sats good.    She's lost 2 lbs here.  136 lbs at home.  She is happy about this.  Has next prolia in mid December.  Due for bone density and already ordered when I saw her before.  She can call to schedule at the [facility].    Past Medical History:  Diagnosis Date  . Allergy   . Arthritis   . 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 Cataract   . Chronic anxiety   . Constipation   . Diverticulosis of sigmoid colon   . Emphysema of lung ([place])   . GERD (gastroesophageal reflux disease)   . Glaucoma   . H/O adenomatous polyp of colon    Dr. Cristina Gong  . High cholesterol   . Hypertension   . Hypothyroidism   . Insomnia   . Lactose intolerance   . Lumbar degenerative disc disease   .  Onychomycosis   . Osteoarthritis   . Osteoporosis   . Second degree Mobitz II AV block    s/p PPM  . Spinal stenosis   . Thyroid disease   . Varicose veins     Past Surgical History:  Procedure Laterality Date  . ABDOMINAL HYSTERECTOMY    . BLADDER REPAIR    . blephoplasty bilateral  03/10/2014  . CATARACT EXTRACTION W/ INTRAOCULAR LENS  IMPLANT, BILATERAL  7 & 8 2014  . CHOLECYSTECTOMY    . COLONOSCOPY  05/18/2012  . EP IMPLANTABLE DEVICE N/A 08/16/2016   MDT Advisa MRI conditional PPM implanted by Dr Rayann Heman for mobitz II second degree AV block  . EYE SURGERY    . herniated disc repair    . INCONTINENCE SURGERY    . JOINT REPLACEMENT    . LAMINECTOMY  02/2011   L3-L5  Dr. Ellene Route  . LUMBAR LAMINECTOMY/DECOMPRESSION MICRODISCECTOMY Right 09/21/2015   Procedure: Right Lumbar Three-FourMicrodiskectomy;  Surgeon: Kristeen Miss, MD;  Location: [facility];  Service: Neurosurgery;  Laterality: Right;  Right L3-4 Microdiskectomy  . SHOULDER ARTHROSCOPY WITH ROTATOR CUFF REPAIR Right 08/2003  . TONSILLECTOMY    . TOTAL HIP ARTHROPLASTY  right 1999    Allergies  Allergen Reactions  . Codeine Nausea Only  . Macrobid [Nitrofurantoin] Diarrhea  . Miralax [Polyethylene Glycol] Other (See Comments)    cramping  . Pain Relief [Acetaminophen] Nausea And Vomiting  . Paxil [Paroxetine Hcl] Nausea Only  . Percodan [Oxycodone-Aspirin] Itching  . Oxycodone Nausea Only  . Oxycodone-Acetaminophen Nausea Only  . Penicillins Rash    Has patient had a PCN reaction causing immediate rash, facial/tongue/throat swelling, SOB or lightheadedness with hypotension: YES Has patient had a PCN reaction causing severe rash involving mucus membranes or skin necrosis: NO Has patient had a PCN reaction that required hospitalization NO Has patient had a PCN reaction occurring within the last 10 years: NO If all of the above answers are "NO", then may proceed with Cephalosporin use.    Outpatient Encounter  Medications as of 08/02/2017  Medication Sig  . Calcium-Phosphorus-Vitamin D (CITRACAL +D3) 250-107-500 MG-MG-UNIT CHEW Chew 2 tablets by mouth daily.   . diclofenac sodium (VOLTAREN) 1 % GEL Apply 2 g topically 4 (four) times daily.  Marland Kitchen diltiazem (CARDIZEM CD) 240 MG 24 hr capsule Take 240 mg by mouth daily.   . dorzolamide-timolol (COSOPT) 22.3-6.8 MG/ML ophthalmic solution Place 1 drop into both eyes 2 (two) times daily.  Marland Kitchen ipratropium (ATROVENT) 0.06 % nasal spray USE 2 SPRAYS IN EACH NOSTRIL 3 TIMES A DAY AS NEEDED.  Marland Kitchen irbesartan-hydrochlorothiazide (AVALIDE) 300-12.5 MG tablet TAKE 1 TABLET EACH DAY.  Marland Kitchen LACTASE PO Take 1 tablet by mouth as needed (when eating dairy products). Take as directed  . LUMIGAN 0.01 % SOLN   . Multiple Vitamins-Minerals (MULTIVITAMIN WITH MINERALS) tablet Take 1 tablet by mouth daily.  . Probiotic Product (ALIGN) 4 MG CAPS Take by mouth daily.  . rosuvastatin (CRESTOR) 10 MG tablet Take 10 mg by mouth daily. Take 1/2 tablet daily for cholesterol  . SYNTHROID 125 MCG tablet Take 1 tablet (125 mcg total) by mouth daily.  . [DISCONTINUED] fluticasone (FLONASE) 50 MCG/ACT nasal spray Place 2 sprays into both nostrils daily.  . [DISCONTINUED] nystatin (MYCOSTATIN) 100000 UNIT/ML suspension Take 5 mLs (500,000 Units total) by mouth 4 (four) times daily.   No facility-administered encounter medications on file as of 08/02/2017.     Review of Systems:  Review of Systems  Constitutional: Negative for chills, fever and malaise/fatigue.  HENT: Positive for hearing loss. Negative for congestion.   Eyes: Negative for blurred vision.  Respiratory: Negative for cough, shortness of breath and wheezing.   Cardiovascular: Negative for chest pain and palpitations.  Gastrointestinal: Negative for abdominal pain, blood in stool, constipation, diarrhea and melena.  Genitourinary: Negative for dysuria.  Musculoskeletal: Positive for back pain.  Skin: Negative for itching and rash.   Neurological: Positive for tingling and sensory change. Negative for dizziness, loss of consciousness and weakness.  Psychiatric/Behavioral: Negative for depression and memory loss. The patient is not nervous/anxious and does not have insomnia.     Health Maintenance  Topic Date Due  . PNA vac Low Risk Adult (2 of 2 - PCV13) 08/29/2014  . INFLUENZA VACCINE  03/29/2017  . TETANUS/TDAP  06/19/2022  . DEXA SCAN  Completed    Physical Exam: Vitals:   08/02/17 1118  BP: 120/60  Pulse: 73  SpO2: 97%  Weight: 139 lb (63 kg)   Body mass index is 23.86 kg/m. Physical Exam  Constitutional: She is oriented to person, place, and time. She appears well-developed and well-nourished. No distress.  HENT:  Still has patches  on tongue-geographic, but no lumps  Cardiovascular: Normal rate, regular rhythm, normal heart sounds and intact distal pulses.  Pulmonary/Chest: Effort normal and breath sounds normal. No respiratory distress.  Abdominal: Soft. Bowel sounds are normal. She exhibits no distension. There is no tenderness.  Musculoskeletal: Normal range of motion.  Neurological: She is alert and oriented to person, place, and time. No cranial nerve deficit.  Skin: Skin is warm and dry.  Psychiatric: She has a normal mood and affect.    Labs reviewed: Basic Metabolic Panel: Recent Labs    08/15/16 1225  08/17/16 0202 09/12/16 1058 01/19/17 0937  NA 143   < > 140 141 143  K 3.8   < > 4.0 4.1 4.0  CL 109   < > 110 106 108  CO2 26   < > 26 26 25   GLUCOSE 121*   < > 98 109* 101*  BUN 17   < > 13 18 15   CREATININE 0.84   < > 1.02* 0.74 0.61  CALCIUM 10.4*   < > 9.3 10.1 9.5  MG 2.1  --   --   --   --   TSH 4.876*  --   --   --  0.90   < > = values in this interval not displayed.   Liver Function Tests: Recent Labs    08/17/16 0202 01/19/17 0937  AST 22 17  ALT 16 12  ALKPHOS 52 71  BILITOT 1.0 0.8  PROT 5.2* 5.9*  ALBUMIN 3.5 3.9   No results for input(s): LIPASE,  AMYLASE in the last 8760 hours. No results for input(s): AMMONIA in the last 8760 hours. CBC: Recent Labs    08/15/16 1225 08/16/16 0226 08/17/16 0202 01/19/17 0937  WBC 8.2 7.6 8.3 6.6  NEUTROABS 5.6  --   --  3,960  HGB 13.2 11.8* 12.4 12.4  HCT 40.8 36.2 39.3 37.6  MCV 92.5 92.1 93.3 89.5  PLT 218 215 209 263   Lipid Panel: Recent Labs    01/19/17 0937  CHOL 138  HDL 59  LDLCALC 52  TRIG 134  CHOLHDL 2.3   ENT note reviewed  Assessment/Plan 1. Geographic tongue -ongoing, but no masses and less pain lately, monitor  2. Second degree Mobitz II AV block -s/p pacer, on cardizem also  3. S/P placement of cardiac pacemaker -stable since pacemaker   4. Essential hypertension -bp well controlled with arb/hctz mix and cardizem  5. Mucopurulent chronic bronchitis ([address]) -no recent issues, not on meds  6. Hypothyroidism due to acquired atrophy of thyroid -cont current levothyroxine,  Lab Results  Component Value Date   TSH 0.90 01/19/2017    7. Piriformis syndrome of right side -advised to me more faithful with exercising from PT, recommended referral back to PT, but she refused at this time, use of tylenol, topicals including her voltaren gel, shoulder better since steroid injection  8. Senile osteoporosis -cont ca with D, weightbearing exercise and prolia therapy  Labs/tests ordered:  Cbc, cmp, flp, tsh before Next appt:  4 mos med mgt, labs before  Tranise Forrest L. Mackayla Mullins, D.O. [facility] [address], [place] [address] Cell Phone (Mon-Fri 8am-5pm):  [phone] On Call:  [phone] & follow prompts after 5pm & weekends Office Phone:  [phone] Office Fax:  [phone]

## 2017-08-08 ENCOUNTER — Other Ambulatory Visit: Payer: Self-pay | Admitting: Internal Medicine

## 2017-08-15 ENCOUNTER — Ambulatory Visit (INDEPENDENT_AMBULATORY_CARE_PROVIDER_SITE_OTHER): Payer: Medicare Other | Admitting: *Deleted

## 2017-08-15 DIAGNOSIS — M810 Age-related osteoporosis without current pathological fracture: Secondary | ICD-10-CM

## 2017-08-15 MED ORDER — DENOSUMAB 60 MG/ML ~~LOC~~ SOLN
60.0000 mg | Freq: Once | SUBCUTANEOUS | Status: AC
  Administered 2017-08-15: 60 mg via SUBCUTANEOUS

## 2017-09-01 ENCOUNTER — Other Ambulatory Visit: Payer: Self-pay | Admitting: *Deleted

## 2017-09-01 MED ORDER — ROSUVASTATIN CALCIUM 10 MG PO TABS: ORAL_TABLET | ORAL | 0 refills | Status: DC

## 2017-09-01 NOTE — Telephone Encounter (Signed)
[facility]  

## 2017-09-04 ENCOUNTER — Other Ambulatory Visit: Payer: Self-pay | Admitting: Internal Medicine

## 2017-09-18 DIAGNOSIS — D3709 Neoplasm of uncertain behavior of other specified sites of the oral cavity: Secondary | ICD-10-CM | POA: Diagnosis not present

## 2017-09-19 ENCOUNTER — Other Ambulatory Visit: Payer: Self-pay | Admitting: Nurse Practitioner

## 2017-09-20 ENCOUNTER — Telehealth: Payer: Self-pay | Admitting: Cardiology

## 2017-09-20 ENCOUNTER — Ambulatory Visit (INDEPENDENT_AMBULATORY_CARE_PROVIDER_SITE_OTHER): Payer: Medicare Other | Admitting: *Deleted

## 2017-09-20 ENCOUNTER — Telehealth: Payer: Self-pay | Admitting: *Deleted

## 2017-09-20 DIAGNOSIS — I441 Atrioventricular block, second degree: Secondary | ICD-10-CM | POA: Diagnosis not present

## 2017-09-20 NOTE — Progress Notes (Signed)
Remote pacemaker transmission.   

## 2017-09-20 NOTE — Telephone Encounter (Signed)
Pt called healthcare front desk complaining of allergies and sinus headache. Per verbal from Dr. Mariea Clonts pt should try zyrtec and flonase and call back next week to let us know how she's doing.

## 2017-09-20 NOTE — Telephone Encounter (Signed)
LMOVM reminding pt to send remote transmission.   

## 2017-09-21 ENCOUNTER — Encounter: Payer: Self-pay | Admitting: Cardiology

## 2017-10-03 LAB — CUP PACEART REMOTE DEVICE CHECK
Battery Remaining Longevity: 114 mo
Battery Voltage: 3.02 V
Brady Statistic AS VP Percent: 9.13 %
Brady Statistic AS VS Percent: 79.72 %
Brady Statistic RA Percent Paced: 11.13 %
Brady Statistic RV Percent Paced: 9.49 %
Date Time Interrogation Session: 20190123210343
Implantable Lead Implant Date: 20171219
Implantable Lead Location: 753860
Implantable Lead Model: 5076
Implantable Pulse Generator Implant Date: 20171219
Lead Channel Impedance Value: 399 Ohm
Lead Channel Pacing Threshold Amplitude: 0.625 V
Lead Channel Pacing Threshold Pulse Width: 0.4 ms
Lead Channel Sensing Intrinsic Amplitude: 3.125 mV
Lead Channel Setting Sensing Sensitivity: 2.8 mV
MDC IDC LEAD IMPLANT DT: 20171219
MDC IDC LEAD LOCATION: 753859
MDC IDC MSMT LEADCHNL RA IMPEDANCE VALUE: 323 Ohm
MDC IDC MSMT LEADCHNL RA IMPEDANCE VALUE: 361 Ohm
MDC IDC MSMT LEADCHNL RA SENSING INTR AMPL: 3.125 mV
MDC IDC MSMT LEADCHNL RV IMPEDANCE VALUE: 323 Ohm
MDC IDC MSMT LEADCHNL RV PACING THRESHOLD AMPLITUDE: 1.625 V
MDC IDC MSMT LEADCHNL RV PACING THRESHOLD PULSEWIDTH: 0.4 ms
MDC IDC MSMT LEADCHNL RV SENSING INTR AMPL: 5 mV
MDC IDC MSMT LEADCHNL RV SENSING INTR AMPL: 5 mV
MDC IDC SET LEADCHNL RA PACING AMPLITUDE: 2 V
MDC IDC SET LEADCHNL RV PACING AMPLITUDE: 3.5 V
MDC IDC SET LEADCHNL RV PACING PULSEWIDTH: 0.4 ms
MDC IDC STAT BRADY AP VP PERCENT: 0.36 %
MDC IDC STAT BRADY AP VS PERCENT: 10.79 %

## 2017-10-10 ENCOUNTER — Other Ambulatory Visit: Payer: Self-pay | Admitting: Internal Medicine

## 2017-10-11 ENCOUNTER — Other Ambulatory Visit: Payer: Self-pay | Admitting: Internal Medicine

## 2017-10-24 DIAGNOSIS — D3709 Neoplasm of uncertain behavior of other specified sites of the oral cavity: Secondary | ICD-10-CM | POA: Diagnosis not present

## 2017-10-30 DIAGNOSIS — H401122 Primary open-angle glaucoma, left eye, moderate stage: Secondary | ICD-10-CM | POA: Diagnosis not present

## 2017-10-30 DIAGNOSIS — K141 Geographic tongue: Secondary | ICD-10-CM | POA: Diagnosis not present

## 2017-10-30 DIAGNOSIS — H401113 Primary open-angle glaucoma, right eye, severe stage: Secondary | ICD-10-CM | POA: Diagnosis not present

## 2017-10-30 DIAGNOSIS — Z961 Presence of intraocular lens: Secondary | ICD-10-CM | POA: Diagnosis not present

## 2017-11-06 ENCOUNTER — Other Ambulatory Visit: Payer: Self-pay | Admitting: Internal Medicine

## 2017-11-30 ENCOUNTER — Other Ambulatory Visit: Payer: Self-pay | Admitting: Internal Medicine

## 2017-12-05 ENCOUNTER — Encounter: Payer: Self-pay | Admitting: Internal Medicine

## 2017-12-05 DIAGNOSIS — D649 Anemia, unspecified: Secondary | ICD-10-CM | POA: Diagnosis not present

## 2017-12-05 DIAGNOSIS — E785 Hyperlipidemia, unspecified: Secondary | ICD-10-CM | POA: Diagnosis not present

## 2017-12-05 DIAGNOSIS — E039 Hypothyroidism, unspecified: Secondary | ICD-10-CM | POA: Diagnosis not present

## 2017-12-05 DIAGNOSIS — E034 Atrophy of thyroid (acquired): Secondary | ICD-10-CM | POA: Diagnosis not present

## 2017-12-05 LAB — CBC AND DIFFERENTIAL
HCT: 39 (ref 36–46)
Hemoglobin: 12.7 (ref 12.0–16.0)
Platelets: 345 (ref 150–399)
WBC: 10.2

## 2017-12-05 LAB — LIPID PANEL
Cholesterol: 134 (ref 0–200)
HDL: 56 (ref 35–70)
LDL Cholesterol: 53
Triglycerides: 126 (ref 40–160)

## 2017-12-05 LAB — HEPATIC FUNCTION PANEL
ALT: 12 (ref 7–35)
AST: 17 (ref 13–35)
Alkaline Phosphatase: 77 (ref 25–125)
Bilirubin, Total: 0.3

## 2017-12-05 LAB — BASIC METABOLIC PANEL
BUN: 16 (ref 4–21)
Creatinine: 0.6 (ref 0.5–1.1)
Glucose: 116
Potassium: 3.6 (ref 3.4–5.3)
Sodium: 141 (ref 137–147)

## 2017-12-05 LAB — TSH: TSH: 1.02 (ref 0.41–5.90)

## 2017-12-13 ENCOUNTER — Encounter: Payer: Self-pay | Admitting: Internal Medicine

## 2017-12-13 ENCOUNTER — Non-Acute Institutional Stay: Payer: Medicare Other | Admitting: Internal Medicine

## 2017-12-13 VITALS — BP 122/60 | HR 81 | Temp 98.7°F | Ht 64.0 in | Wt 141.0 lb

## 2017-12-13 DIAGNOSIS — I441 Atrioventricular block, second degree: Secondary | ICD-10-CM

## 2017-12-13 DIAGNOSIS — Z950 Presence of cardiac pacemaker: Secondary | ICD-10-CM

## 2017-12-13 DIAGNOSIS — I6522 Occlusion and stenosis of left carotid artery: Secondary | ICD-10-CM

## 2017-12-13 DIAGNOSIS — M19012 Primary osteoarthritis, left shoulder: Secondary | ICD-10-CM | POA: Diagnosis not present

## 2017-12-13 DIAGNOSIS — E034 Atrophy of thyroid (acquired): Secondary | ICD-10-CM

## 2017-12-13 DIAGNOSIS — M810 Age-related osteoporosis without current pathological fracture: Secondary | ICD-10-CM

## 2017-12-13 DIAGNOSIS — R739 Hyperglycemia, unspecified: Secondary | ICD-10-CM | POA: Diagnosis not present

## 2017-12-13 NOTE — Patient Instructions (Addendum)
Please go see occupational therapy about your left shoulder pain and decreasing mobility.  Also, call [facility] to follow up with the doctor there on an annual basis.  I have ordered carotid dopplers to check on your circulation of your neck arteries.  Please increase your walking or return to the pool to walk to help keep your sugar down and improve your energy levels.  It also helps to maintain your bone density.    Call to schedule your bone density appointment at the breast center.

## 2017-12-13 NOTE — Progress Notes (Signed)
Location:  [facility] clinic Provider:  Elora Wolter L. Mariea Clonts, D.O., C.M.D.  Code Status: full code Goals of Care:  Advanced Directives 12/13/2017  Does Patient Have a Medical Advance Directive? Yes  Type of Advance Directive [place]  Does patient want to make changes to medical advance directive? No - Patient declined  Copy of [place] in Chart? Yes   Chief Complaint  Patient presents with  . Medical Management of Chronic Issues    64mth follow-up    HPI: Patient is a 84 y.o. female seen today for medical management of chronic diseases.   Doing well outside of her runny nose.  Left should is more and more painful.  She is not convinced that the pacemaker doesn't have something to do with it.  It hurts where the pacemaker and her shoulder meet due to her petite shoulder stature.  ROM of shoulder is also worse and pain is there almost all of the time, not just when she extends the arm or rotates it medially.  Last injection didn't last long either.  She says she knows from what [facility] said to her, she is not a surgical candidate--I clarified that she is not a good surgical candidate based on her age and cardiac history, and concerns about ability to do the aggressive therapy needed after such a large surgery.  She's been getting chills and the thermostat will read 74-75 degrees.   She thinks she's just less active.  She admits to not walking in the pool or outside like she used to do.  She also was looking at her "link to life" and noticed that it said she had carotid artery stenosis--oddly, we don't have that in her records.  She also notes that she cannot feel her left carotid pulse well.    She is tolerating prolia well and asks how long she should take it.  Discussed continuing on it for at least 10 years based on studies that have been done.  She has not gone back for her bone density to see how effective it is.    Reviewed labs with  her.  She had an elevated fasting glucose of 112 this past check. TSH normal, other labs all fine.      Past Medical History:  Diagnosis Date  . Allergy   . Arthritis   . 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 Cataract   . Chronic anxiety   . Constipation   . Diverticulosis of sigmoid colon   . Emphysema of lung ([place])   . GERD (gastroesophageal reflux disease)   . Glaucoma   . H/O adenomatous polyp of colon    Dr. Cristina Gong  . High cholesterol   . Hypertension   . Hypothyroidism   . Insomnia   . Lactose intolerance   . Lumbar degenerative disc disease   . Onychomycosis   . Osteoarthritis   . Osteoporosis   . Second degree Mobitz II AV block    s/p PPM  . Spinal stenosis   . Thyroid disease   . Varicose veins     Past Surgical History:  Procedure Laterality Date  . ABDOMINAL HYSTERECTOMY    . BLADDER REPAIR    . blephoplasty bilateral  03/10/2014  . CATARACT EXTRACTION W/ INTRAOCULAR LENS  IMPLANT, BILATERAL  7 & 8 2014  . CHOLECYSTECTOMY    . COLONOSCOPY  05/18/2012  . EP  IMPLANTABLE DEVICE N/A 08/16/2016   MDT Advisa MRI conditional PPM implanted by Dr Rayann Heman for mobitz II second degree AV block  . EYE SURGERY    . herniated disc repair    . INCONTINENCE SURGERY    . JOINT REPLACEMENT    . LAMINECTOMY  02/2011   L3-L5  Dr. Ellene Route  . LUMBAR LAMINECTOMY/DECOMPRESSION MICRODISCECTOMY Right 09/21/2015   Procedure: Right Lumbar Three-FourMicrodiskectomy;  Surgeon: Kristeen Miss, MD;  Location: [facility];  Service: Neurosurgery;  Laterality: Right;  Right L3-4 Microdiskectomy  . SHOULDER ARTHROSCOPY WITH ROTATOR CUFF REPAIR Right 08/2003  . TONSILLECTOMY    . TOTAL HIP ARTHROPLASTY     right 1999    Allergies  Allergen Reactions  . Codeine Nausea Only  . Macrobid [Nitrofurantoin] Diarrhea  . Miralax [Polyethylene Glycol] Other (See Comments)    cramping  . Pain Relief [Acetaminophen] Nausea And Vomiting  .  Paxil [Paroxetine Hcl] Nausea Only  . Percodan [Oxycodone-Aspirin] Itching  . Oxycodone Nausea Only  . Oxycodone-Acetaminophen Nausea Only  . Penicillins Rash    Has patient had a PCN reaction causing immediate rash, facial/tongue/throat swelling, SOB or lightheadedness with hypotension: YES Has patient had a PCN reaction causing severe rash involving mucus membranes or skin necrosis: NO Has patient had a PCN reaction that required hospitalization NO Has patient had a PCN reaction occurring within the last 10 years: NO If all of the above answers are "NO", then may proceed with Cephalosporin use.    Outpatient Encounter Medications as of 12/13/2017  Medication Sig  . Calcium-Phosphorus-Vitamin D (CITRACAL +D3) 250-107-500 MG-MG-UNIT CHEW Chew 2 tablets by mouth daily.   Marland Kitchen diltiazem (CARDIZEM CD) 240 MG 24 hr capsule TAKE 1 CAPSULE EVERY DAY.  Marland Kitchen dorzolamide-timolol (COSOPT) 22.3-6.8 MG/ML ophthalmic solution Place 1 drop into both eyes 2 (two) times daily.  Marland Kitchen ipratropium (ATROVENT) 0.06 % nasal spray USE 2 SPRAYS IN EACH NOSTRIL 3 TIMES A DAY AS NEEDED.  Marland Kitchen irbesartan-hydrochlorothiazide (AVALIDE) 300-12.5 MG tablet TAKE 1 TABLET EACH DAY.  Marland Kitchen LACTASE PO Take 1 tablet by mouth as needed (when eating dairy products). Take as directed  . LUMIGAN 0.01 % SOLN   . Multiple Vitamins-Minerals (MULTIVITAMIN WITH MINERALS) tablet Take 1 tablet by mouth daily.  . Probiotic Product (ALIGN) 4 MG CAPS Take by mouth daily.  . rosuvastatin (CRESTOR) 10 MG tablet TAKE 1 TABLET EACH DAY.  . SYNTHROID 125 MCG tablet TAKE 1 TABLET ONCE DAILY.  . [DISCONTINUED] diclofenac sodium (VOLTAREN) 1 % GEL Apply 2 g topically 4 (four) times daily.   No facility-administered encounter medications on file as of 12/13/2017.     Review of Systems:  Review of Systems  Constitutional: Positive for chills. Negative for fever, malaise/fatigue and weight loss.  HENT:       Geographic tongue  Eyes: Negative for blurred  vision.  Respiratory: Negative for cough and shortness of breath.   Cardiovascular: Positive for leg swelling. Negative for chest pain and palpitations.       Nonpitting (due to not walking)  Gastrointestinal: Negative for abdominal pain, blood in stool, constipation and melena.  Genitourinary: Negative for dysuria.  Musculoskeletal: Positive for joint pain. Negative for falls.  Skin: Negative for itching and rash.  Neurological: Negative for dizziness.  Psychiatric/Behavioral: Negative for depression and memory loss. The patient is nervous/anxious. The patient does not have insomnia.     Health Maintenance  Topic Date Due  . PNA vac Low Risk Adult (2 of 2 - PCV13)  08/29/2014  . INFLUENZA VACCINE  03/29/2018  . TETANUS/TDAP  06/19/2022  . DEXA SCAN  Completed    Physical Exam: Vitals:   12/13/17 1058  BP: 122/60  Pulse: 81  Temp: 98.7 F (37.1 C)  TempSrc: Oral  SpO2: 97%  Weight: 141 lb (64 kg)  Height: 5\' 4"  (1.626 m)   Body mass index is 24.2 kg/m. Physical Exam  Constitutional: She is oriented to person, place, and time. She appears well-developed and well-nourished. No distress.  Neck:  No carotid bruits  Cardiovascular: Normal rate, regular rhythm and intact distal pulses.  Murmur heard. Pulmonary/Chest: Effort normal and breath sounds normal. No respiratory distress.  Abdominal: Soft. Bowel sounds are normal. She exhibits no distension. There is no tenderness.  Musculoskeletal:  Tenderness and mild swelling around left shoulder joint; only able to internally rotate left shoulder to where hand is at level of sacrum/buttocks, decreased flexion as well  Neurological: She is alert and oriented to person, place, and time.  Skin: Skin is warm and dry.  Psychiatric: She has a normal mood and affect.    Labs reviewed: Basic Metabolic Panel: Recent Labs    01/19/17 0937 12/05/17 0700  NA 143 141  K 4.0 3.6  CL 108  --   CO2 25  --   GLUCOSE 101*  --   BUN 15  16  CREATININE 0.61 0.6  CALCIUM 9.5  --   TSH 0.90 1.02   Liver Function Tests: Recent Labs    01/19/17 0937 12/05/17 0700  AST 17 17  ALT 12 12  ALKPHOS 71 77  BILITOT 0.8  --   PROT 5.9*  --   ALBUMIN 3.9  --    No results for input(s): LIPASE, AMYLASE in the last 8760 hours. No results for input(s): AMMONIA in the last 8760 hours. CBC: Recent Labs    01/19/17 0937 12/05/17 0700  WBC 6.6 10.2  NEUTROABS 3,960  --   HGB 12.4 12.7  HCT 37.6 39  MCV 89.5  --   PLT 263 345   Lipid Panel: Recent Labs    01/19/17 0937 12/05/17 0700  CHOL 138 134  HDL 59 56  LDLCALC 52 53  TRIG 134 126  CHOLHDL 2.3  --    Assessment/Plan 1. Left carotid stenosis - supposedly in history, but we don't have this on file -check studies to confirm and determine degree of stenosis, but I don't hear a bruit and I do feel bilateral carotid pulses - US Carotid Bilateral; Future  2. 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 Second degree Mobitz II AV block -in the past, underwent pacemaker placement for this, gets regular pacer checks, but has not been to cardiology themselves in over a year--she's going to call them  4. S/P placement of cardiac pacemaker -feels like her left shoulder got worse after her pacemaker placement -she is small in stature in the shoulder area so pacemaker is quite close to shoulder and rubs against it with certain movements which is painful -no redness or warmth localized to pacemaker site -left shoulder with mild chronic swelling  5. Senile osteoporosis -cont prolia injections, ca with D and get back to weightbearing exercises she's been neglecting  6. Hypothyroidism due to acquired atrophy  of  thyroid -tsh wnl, cont current levothyroxine and monitor -normal so does not explain "chills"  7. Hyperglycemia -fasting glucose elevated off and on over past few years -check hba1c before next visit -get back to water walking or outside walking to help lower this   Labs/tests ordered:  Bmp, hba1c before  Next appt:  04/04/18 at [place]. Destiny [name], D.O. [facility] [address], [place] [address] Cell Phone (Mon-Fri 8am-5pm):  [phone] On Call:  [phone] & follow prompts after 5pm & weekends Office Phone:  [phone] Office Fax:  [phone]

## 2017-12-20 ENCOUNTER — Ambulatory Visit (INDEPENDENT_AMBULATORY_CARE_PROVIDER_SITE_OTHER): Payer: Medicare Other | Admitting: *Deleted

## 2017-12-20 ENCOUNTER — Telehealth: Payer: Self-pay | Admitting: Cardiology

## 2017-12-20 ENCOUNTER — Other Ambulatory Visit: Payer: Self-pay | Admitting: Internal Medicine

## 2017-12-20 DIAGNOSIS — M25512 Pain in left shoulder: Secondary | ICD-10-CM | POA: Diagnosis not present

## 2017-12-20 DIAGNOSIS — M6281 Muscle weakness (generalized): Secondary | ICD-10-CM | POA: Diagnosis not present

## 2017-12-20 DIAGNOSIS — M19012 Primary osteoarthritis, left shoulder: Secondary | ICD-10-CM | POA: Diagnosis not present

## 2017-12-20 DIAGNOSIS — I441 Atrioventricular block, second degree: Secondary | ICD-10-CM

## 2017-12-20 NOTE — Progress Notes (Signed)
Remote pacemaker transmission.   

## 2017-12-20 NOTE — Telephone Encounter (Signed)
Spoke with pt and reminded pt of remote transmission that is due today. Pt verbalized understanding.   

## 2017-12-22 ENCOUNTER — Encounter: Payer: Self-pay | Admitting: Cardiology

## 2017-12-22 ENCOUNTER — Ambulatory Visit
Admission: RE | Admit: 2017-12-22 | Discharge: 2017-12-22 | Disposition: A | Discharge status: Home / Self Care | Payer: Medicare Other | Source: Ambulatory Visit | Attending: Internal Medicine | Admitting: Internal Medicine

## 2017-12-22 DIAGNOSIS — I6522 Occlusion and stenosis of left carotid artery: Secondary | ICD-10-CM

## 2017-12-22 DIAGNOSIS — I6523 Occlusion and stenosis of bilateral carotid arteries: Secondary | ICD-10-CM | POA: Diagnosis not present

## 2017-12-22 LAB — CUP PACEART REMOTE DEVICE CHECK
Brady Statistic AP VP Percent: 0.01 %
Brady Statistic AP VS Percent: 3.92 %
Brady Statistic AS VP Percent: 0.04 %
Brady Statistic AS VS Percent: 96.03 %
Brady Statistic RA Percent Paced: 3.92 %
Date Time Interrogation Session: 20190424181913
Implantable Lead Implant Date: 20171219
Implantable Lead Location: 753860
Implantable Lead Model: 5076
Implantable Lead Model: 5076
Implantable Pulse Generator Implant Date: 20171219
Lead Channel Impedance Value: 304 Ohm
Lead Channel Impedance Value: 304 Ohm
Lead Channel Impedance Value: 342 Ohm
Lead Channel Pacing Threshold Amplitude: 1.625 V
Lead Channel Pacing Threshold Pulse Width: 0.4 ms
Lead Channel Pacing Threshold Pulse Width: 0.4 ms
Lead Channel Sensing Intrinsic Amplitude: 2.75 mV
Lead Channel Sensing Intrinsic Amplitude: 5.125 mV
Lead Channel Sensing Intrinsic Amplitude: 5.125 mV
Lead Channel Setting Pacing Amplitude: 2 V
Lead Channel Setting Pacing Amplitude: 3.25 V
Lead Channel Setting Pacing Pulse Width: 0.4 ms
MDC IDC LEAD IMPLANT DT: 20171219
MDC IDC LEAD LOCATION: 753859
MDC IDC MSMT BATTERY REMAINING LONGEVITY: 116 mo
MDC IDC MSMT BATTERY VOLTAGE: 3.03 V
MDC IDC MSMT LEADCHNL RA PACING THRESHOLD AMPLITUDE: 0.75 V
MDC IDC MSMT LEADCHNL RA SENSING INTR AMPL: 2.75 mV
MDC IDC MSMT LEADCHNL RV IMPEDANCE VALUE: 380 Ohm
MDC IDC SET LEADCHNL RV SENSING SENSITIVITY: 2.8 mV
MDC IDC STAT BRADY RV PERCENT PACED: 0.05 %

## 2017-12-27 DIAGNOSIS — M19012 Primary osteoarthritis, left shoulder: Secondary | ICD-10-CM | POA: Diagnosis not present

## 2017-12-27 DIAGNOSIS — M6389 Disorders of muscle in diseases classified elsewhere, multiple sites: Secondary | ICD-10-CM | POA: Diagnosis not present

## 2017-12-27 DIAGNOSIS — M25512 Pain in left shoulder: Secondary | ICD-10-CM | POA: Diagnosis not present

## 2017-12-28 DIAGNOSIS — M19012 Primary osteoarthritis, left shoulder: Secondary | ICD-10-CM | POA: Diagnosis not present

## 2017-12-28 DIAGNOSIS — M25512 Pain in left shoulder: Secondary | ICD-10-CM | POA: Diagnosis not present

## 2017-12-28 DIAGNOSIS — M6389 Disorders of muscle in diseases classified elsewhere, multiple sites: Secondary | ICD-10-CM | POA: Diagnosis not present

## 2018-01-01 DIAGNOSIS — M19012 Primary osteoarthritis, left shoulder: Secondary | ICD-10-CM | POA: Diagnosis not present

## 2018-01-01 DIAGNOSIS — M6389 Disorders of muscle in diseases classified elsewhere, multiple sites: Secondary | ICD-10-CM | POA: Diagnosis not present

## 2018-01-01 DIAGNOSIS — M25512 Pain in left shoulder: Secondary | ICD-10-CM | POA: Diagnosis not present

## 2018-01-02 DIAGNOSIS — M6389 Disorders of muscle in diseases classified elsewhere, multiple sites: Secondary | ICD-10-CM | POA: Diagnosis not present

## 2018-01-02 DIAGNOSIS — M25512 Pain in left shoulder: Secondary | ICD-10-CM | POA: Diagnosis not present

## 2018-01-02 DIAGNOSIS — M19012 Primary osteoarthritis, left shoulder: Secondary | ICD-10-CM | POA: Diagnosis not present

## 2018-01-03 DIAGNOSIS — M25561 Pain in right knee: Secondary | ICD-10-CM | POA: Diagnosis not present

## 2018-01-08 DIAGNOSIS — M6389 Disorders of muscle in diseases classified elsewhere, multiple sites: Secondary | ICD-10-CM | POA: Diagnosis not present

## 2018-01-08 DIAGNOSIS — M25512 Pain in left shoulder: Secondary | ICD-10-CM | POA: Diagnosis not present

## 2018-01-08 DIAGNOSIS — M19012 Primary osteoarthritis, left shoulder: Secondary | ICD-10-CM | POA: Diagnosis not present

## 2018-01-11 DIAGNOSIS — M25512 Pain in left shoulder: Secondary | ICD-10-CM | POA: Diagnosis not present

## 2018-01-11 DIAGNOSIS — M6389 Disorders of muscle in diseases classified elsewhere, multiple sites: Secondary | ICD-10-CM | POA: Diagnosis not present

## 2018-01-11 DIAGNOSIS — M19012 Primary osteoarthritis, left shoulder: Secondary | ICD-10-CM | POA: Diagnosis not present

## 2018-01-15 DIAGNOSIS — M6389 Disorders of muscle in diseases classified elsewhere, multiple sites: Secondary | ICD-10-CM | POA: Diagnosis not present

## 2018-01-15 DIAGNOSIS — M19012 Primary osteoarthritis, left shoulder: Secondary | ICD-10-CM | POA: Diagnosis not present

## 2018-01-15 DIAGNOSIS — M25512 Pain in left shoulder: Secondary | ICD-10-CM | POA: Diagnosis not present

## 2018-01-17 DIAGNOSIS — M25512 Pain in left shoulder: Secondary | ICD-10-CM | POA: Diagnosis not present

## 2018-01-17 DIAGNOSIS — M19012 Primary osteoarthritis, left shoulder: Secondary | ICD-10-CM | POA: Diagnosis not present

## 2018-01-17 DIAGNOSIS — M6389 Disorders of muscle in diseases classified elsewhere, multiple sites: Secondary | ICD-10-CM | POA: Diagnosis not present

## 2018-01-18 ENCOUNTER — Other Ambulatory Visit: Payer: Self-pay | Admitting: Internal Medicine

## 2018-01-24 DIAGNOSIS — M25561 Pain in right knee: Secondary | ICD-10-CM | POA: Diagnosis not present

## 2018-01-29 DIAGNOSIS — M6389 Disorders of muscle in diseases classified elsewhere, multiple sites: Secondary | ICD-10-CM | POA: Diagnosis not present

## 2018-01-29 DIAGNOSIS — M25512 Pain in left shoulder: Secondary | ICD-10-CM | POA: Diagnosis not present

## 2018-01-29 DIAGNOSIS — M19012 Primary osteoarthritis, left shoulder: Secondary | ICD-10-CM | POA: Diagnosis not present

## 2018-01-31 DIAGNOSIS — M6389 Disorders of muscle in diseases classified elsewhere, multiple sites: Secondary | ICD-10-CM | POA: Diagnosis not present

## 2018-01-31 DIAGNOSIS — M19012 Primary osteoarthritis, left shoulder: Secondary | ICD-10-CM | POA: Diagnosis not present

## 2018-01-31 DIAGNOSIS — M25512 Pain in left shoulder: Secondary | ICD-10-CM | POA: Diagnosis not present

## 2018-02-05 ENCOUNTER — Other Ambulatory Visit: Payer: Self-pay | Admitting: Internal Medicine

## 2018-02-05 DIAGNOSIS — M19012 Primary osteoarthritis, left shoulder: Secondary | ICD-10-CM | POA: Diagnosis not present

## 2018-02-05 DIAGNOSIS — M25512 Pain in left shoulder: Secondary | ICD-10-CM | POA: Diagnosis not present

## 2018-02-05 DIAGNOSIS — M6389 Disorders of muscle in diseases classified elsewhere, multiple sites: Secondary | ICD-10-CM | POA: Diagnosis not present

## 2018-02-07 DIAGNOSIS — M19012 Primary osteoarthritis, left shoulder: Secondary | ICD-10-CM | POA: Diagnosis not present

## 2018-02-07 DIAGNOSIS — M6389 Disorders of muscle in diseases classified elsewhere, multiple sites: Secondary | ICD-10-CM | POA: Diagnosis not present

## 2018-02-07 DIAGNOSIS — M25512 Pain in left shoulder: Secondary | ICD-10-CM | POA: Diagnosis not present

## 2018-02-12 DIAGNOSIS — M19012 Primary osteoarthritis, left shoulder: Secondary | ICD-10-CM | POA: Diagnosis not present

## 2018-02-12 DIAGNOSIS — M6389 Disorders of muscle in diseases classified elsewhere, multiple sites: Secondary | ICD-10-CM | POA: Diagnosis not present

## 2018-02-12 DIAGNOSIS — M25512 Pain in left shoulder: Secondary | ICD-10-CM | POA: Diagnosis not present

## 2018-02-20 ENCOUNTER — Ambulatory Visit: Payer: Medicare Other | Admitting: *Deleted

## 2018-02-20 DIAGNOSIS — M810 Age-related osteoporosis without current pathological fracture: Secondary | ICD-10-CM

## 2018-02-20 MED ORDER — DENOSUMAB 60 MG/ML ~~LOC~~ SOSY
60.0000 mg | PREFILLED_SYRINGE | Freq: Once | SUBCUTANEOUS | Status: AC
  Administered 2018-02-20: 60 mg via SUBCUTANEOUS

## 2018-02-26 ENCOUNTER — Encounter: Payer: Self-pay | Admitting: Internal Medicine

## 2018-02-26 ENCOUNTER — Ambulatory Visit (INDEPENDENT_AMBULATORY_CARE_PROVIDER_SITE_OTHER): Payer: Medicare Other | Admitting: Internal Medicine

## 2018-02-26 ENCOUNTER — Other Ambulatory Visit: Payer: Self-pay | Admitting: Internal Medicine

## 2018-02-26 VITALS — BP 126/72 | HR 81 | Ht 64.0 in | Wt 135.0 lb

## 2018-02-26 DIAGNOSIS — I6522 Occlusion and stenosis of left carotid artery: Secondary | ICD-10-CM | POA: Diagnosis not present

## 2018-02-26 DIAGNOSIS — I441 Atrioventricular block, second degree: Secondary | ICD-10-CM | POA: Diagnosis not present

## 2018-02-26 DIAGNOSIS — I10 Essential (primary) hypertension: Secondary | ICD-10-CM | POA: Diagnosis not present

## 2018-02-26 DIAGNOSIS — R001 Bradycardia, unspecified: Secondary | ICD-10-CM

## 2018-02-26 NOTE — Patient Instructions (Signed)
Medication Instructions:  Your physician recommends that you continue on your current medications as directed. Please refer to the Current Medication list given to you today.  *If you need a refill on your cardiac medications before your next appointment, please call your pharmacy*  Labwork: None ordered  Testing/Procedures: None ordered  Follow-Up: Remote monitoring is used to monitor your Pacemaker or ICD from home. This monitoring reduces the number of office visits required to check your device to one time per year. It allows [place] to keep an eye on the functioning of your device to ensure it is working properly. You are scheduled for a device check from home on 03/21/2018. You may send your transmission at any time that day. If you have a wireless device, the transmission will be sent automatically. After your physician reviews your transmission, you will receive a postcard with your next transmission date.  Your physician wants you to follow-up in: 1 year with Tommye Standard, PA.  You will receive a reminder letter in the mail two months in advance. If you don't receive a letter, please call our office to schedule the follow-up appointment.  Thank you for choosing [facility]!!   Trinidad Curet, RN [phone]  Any Other Special Instructions Will Be Listed Below (If Applicable).

## 2018-02-26 NOTE — Progress Notes (Signed)
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 female who presents today for routine electrophysiology followup.  Since last being seen in our clinic, the patient reports doing very well.  Today, she denies symptoms of palpitations, chest pain, shortness of breath,  lower extremity edema, dizziness, presyncope, or syncope.  The patient is otherwise without complaint today.   Past Medical History:  Diagnosis Date  . Allergy   . Arthritis   . 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 Cataract   . Chronic anxiety   . Constipation   . Diverticulosis of sigmoid colon   . Emphysema of lung ([place])   . GERD (gastroesophageal reflux disease)   . Glaucoma   . H/O adenomatous polyp of colon    Dr. Cristina Gong  . High cholesterol   . Hypertension   . Hypothyroidism   . Insomnia   . Lactose intolerance   . Lumbar degenerative disc disease   . Onychomycosis   . Osteoarthritis   . Osteoporosis   . Second degree Mobitz II AV block    s/p PPM  . Spinal stenosis   . Thyroid disease   . Varicose veins    Past Surgical History:  Procedure Laterality Date  . ABDOMINAL HYSTERECTOMY    . BLADDER REPAIR    . blephoplasty bilateral  03/10/2014  . CATARACT EXTRACTION W/ INTRAOCULAR LENS  IMPLANT, BILATERAL  7 & 8 2014  . CHOLECYSTECTOMY    . COLONOSCOPY  05/18/2012  . EP IMPLANTABLE DEVICE N/A 08/16/2016   MDT Advisa MRI conditional PPM implanted by Dr Rayann Heman for mobitz II second degree AV block  . EYE SURGERY    . herniated disc repair    . INCONTINENCE SURGERY    . JOINT REPLACEMENT    . LAMINECTOMY  02/2011   L3-L5  Dr. Ellene Route  . LUMBAR LAMINECTOMY/DECOMPRESSION MICRODISCECTOMY Right 09/21/2015   Procedure: Right Lumbar Three-FourMicrodiskectomy;  Surgeon: Kristeen Miss, MD;  Location: [place] [facility];  Service: Neurosurgery;  Laterality: Right;  Right L3-4 Microdiskectomy  . SHOULDER ARTHROSCOPY WITH ROTATOR  CUFF REPAIR Right 08/2003  . TONSILLECTOMY    . TOTAL HIP ARTHROPLASTY     right 1999    ROS- all systems are reviewed and negative except as per HPI above  Current Outpatient Medications  Medication Sig Dispense Refill  . Calcium-Phosphorus-Vitamin D (CITRACAL +D3) 250-107-500 MG-MG-UNIT CHEW Chew 2 tablets by mouth daily.     . Cyanocobalamin (VITAMIN B 12 PO) Take 1 tablet by mouth daily.    Marland Kitchen diltiazem (CARDIZEM CD) 240 MG 24 hr capsule TAKE 1 CAPSULE EVERY DAY. 90 capsule 1  . dorzolamide-timolol (COSOPT) 22.3-6.8 MG/ML ophthalmic solution Place 1 drop into both eyes 2 (two) times daily.  5  . ipratropium (ATROVENT) 0.06 % nasal spray USE 2 SPRAYS IN EACH NOSTRIL 3 TIMES A DAY AS NEEDED. 15 mL 0  . LACTASE PO Take 1 tablet by mouth as needed (when eating dairy products). Take as directed    . LUMIGAN 0.01 % SOLN   98  . Multiple Vitamins-Minerals (MULTIVITAMIN WITH MINERALS) tablet Take 1 tablet by mouth daily.    . Probiotic Product (ALIGN) 4 MG CAPS Take by mouth daily.    . rosuvastatin (CRESTOR) 10 MG tablet TAKE 1/2 TABLET ONCE DAILY FOR CHOLESTEROL 45 tablet 0  . SYNTHROID 125 MCG tablet TAKE 1 TABLET  ONCE DAILY. 90 tablet 0   No current facility-administered medications for this visit.     Physical Exam: Vitals:   02/26/18 1416  BP: 126/72  Pulse: 81  Weight: 135 lb (61.2 kg)  Height: 5\' 4"  (1.626 m)    GEN- The patient is well appearing, alert and oriented x 3 today.   Head- normocephalic, atraumatic Eyes-  Sclera clear, conjunctiva pink Ears- hearing intact Oropharynx- clear Lungs- Clear to ausculation bilaterally, normal work of breathing Chest- pacemaker pocket is well healed Heart- Regular rate and rhythm, no murmurs, rubs or gallops, PMI not laterally displaced GI- soft, NT, ND, + BS Extremities- no clubbing, cyanosis, or edema  Pacemaker interrogation- reviewed in detail today,  See PACEART report  ekg tracing ordered today is personally reviewed and  shows sinus rhythm 81 bpm, PR 146 msec, QRS 128 msec, incomplete LBBB  Assessment and Plan:  1. Symptomatic mobitz II second degree heart block Normal pacemaker function See Pace Art report No changes today  2. HTN Stable No change required today  [facility] Return to see EP PA in a year  Thompson Grayer MD, [facility] 02/26/2018 3:17 PM

## 2018-02-27 LAB — CUP PACEART INCLINIC DEVICE CHECK
Battery Voltage: 3.02 V
Brady Statistic RA Percent Paced: 8.35 %
Implantable Lead Implant Date: 20171219
Implantable Lead Location: 753859
Implantable Lead Location: 753860
Implantable Lead Model: 5076
Implantable Pulse Generator Implant Date: 20171219
Lead Channel Impedance Value: 304 Ohm
Lead Channel Impedance Value: 342 Ohm
Lead Channel Pacing Threshold Amplitude: 1.375 V
Lead Channel Pacing Threshold Pulse Width: 0.4 ms
Lead Channel Pacing Threshold Pulse Width: 0.4 ms
Lead Channel Sensing Intrinsic Amplitude: 2.5 mV
Lead Channel Sensing Intrinsic Amplitude: 5.625 mV
Lead Channel Setting Pacing Amplitude: 2 V
Lead Channel Setting Pacing Amplitude: 2.75 V
Lead Channel Setting Pacing Pulse Width: 0.4 ms
MDC IDC LEAD IMPLANT DT: 20171219
MDC IDC MSMT BATTERY REMAINING LONGEVITY: 108 mo
MDC IDC MSMT LEADCHNL RA IMPEDANCE VALUE: 361 Ohm
MDC IDC MSMT LEADCHNL RA PACING THRESHOLD AMPLITUDE: 0.625 V
MDC IDC MSMT LEADCHNL RA SENSING INTR AMPL: 3.25 mV
MDC IDC MSMT LEADCHNL RV IMPEDANCE VALUE: 399 Ohm
MDC IDC MSMT LEADCHNL RV SENSING INTR AMPL: 4.875 mV
MDC IDC SESS DTM: 20190701190023
MDC IDC SET LEADCHNL RV SENSING SENSITIVITY: 2.8 mV
MDC IDC STAT BRADY AP VP PERCENT: 0.17 %
MDC IDC STAT BRADY AP VS PERCENT: 8.2 %
MDC IDC STAT BRADY AS VP PERCENT: 3.64 %
MDC IDC STAT BRADY AS VS PERCENT: 87.99 %
MDC IDC STAT BRADY RV PERCENT PACED: 3.81 %

## 2018-03-12 ENCOUNTER — Telehealth: Payer: Self-pay | Admitting: *Deleted

## 2018-03-12 NOTE — Telephone Encounter (Signed)
Patient called and stated that she would like a referral placed for a Bone Density. Stated that [place] told her that she would call for an appointment but she hasn't heard anything yet so she is calling to check on this. Please Advise.

## 2018-03-12 NOTE — Telephone Encounter (Signed)
There's an order in epic for a bone density at the [facility].   Please provide patient with their phone number so she can arrange her appt

## 2018-03-13 NOTE — Telephone Encounter (Signed)
Patient notified and agreed. Contact number given.

## 2018-03-16 ENCOUNTER — Other Ambulatory Visit: Payer: Self-pay | Admitting: Internal Medicine

## 2018-03-16 DIAGNOSIS — E034 Atrophy of thyroid (acquired): Secondary | ICD-10-CM

## 2018-03-16 DIAGNOSIS — E739 Lactose intolerance, unspecified: Secondary | ICD-10-CM

## 2018-03-16 DIAGNOSIS — M810 Age-related osteoporosis without current pathological fracture: Secondary | ICD-10-CM

## 2018-03-19 ENCOUNTER — Other Ambulatory Visit: Payer: Self-pay | Admitting: Internal Medicine

## 2018-03-19 DIAGNOSIS — Z961 Presence of intraocular lens: Secondary | ICD-10-CM | POA: Diagnosis not present

## 2018-03-19 DIAGNOSIS — H401122 Primary open-angle glaucoma, left eye, moderate stage: Secondary | ICD-10-CM | POA: Diagnosis not present

## 2018-03-19 DIAGNOSIS — H401113 Primary open-angle glaucoma, right eye, severe stage: Secondary | ICD-10-CM | POA: Diagnosis not present

## 2018-03-21 ENCOUNTER — Ambulatory Visit (INDEPENDENT_AMBULATORY_CARE_PROVIDER_SITE_OTHER): Payer: Medicare Other | Admitting: *Deleted

## 2018-03-21 DIAGNOSIS — I441 Atrioventricular block, second degree: Secondary | ICD-10-CM

## 2018-03-21 NOTE — Progress Notes (Signed)
Remote pacemaker transmission.   

## 2018-03-27 ENCOUNTER — Encounter: Payer: Self-pay | Admitting: Internal Medicine

## 2018-03-27 DIAGNOSIS — I10 Essential (primary) hypertension: Secondary | ICD-10-CM | POA: Diagnosis not present

## 2018-03-27 DIAGNOSIS — E119 Type 2 diabetes mellitus without complications: Secondary | ICD-10-CM | POA: Diagnosis not present

## 2018-03-27 DIAGNOSIS — D649 Anemia, unspecified: Secondary | ICD-10-CM | POA: Diagnosis not present

## 2018-03-27 DIAGNOSIS — R739 Hyperglycemia, unspecified: Secondary | ICD-10-CM | POA: Diagnosis not present

## 2018-03-27 LAB — BASIC METABOLIC PANEL
BUN: 22 — AB (ref 4–21)
Creatinine: 0.5 (ref 0.5–1.1)
Glucose: 109
Potassium: 4.2 (ref 3.4–5.3)
Sodium: 142 (ref 137–147)

## 2018-03-27 LAB — HEMOGLOBIN A1C: Hemoglobin A1C: 6.3

## 2018-04-01 ENCOUNTER — Other Ambulatory Visit: Payer: Self-pay | Admitting: Internal Medicine

## 2018-04-04 ENCOUNTER — Non-Acute Institutional Stay: Payer: Medicare Other | Admitting: Internal Medicine

## 2018-04-04 ENCOUNTER — Encounter: Payer: Self-pay | Admitting: Internal Medicine

## 2018-04-04 VITALS — BP 138/68 | HR 85 | Temp 99.2°F | Ht 64.0 in | Wt 135.0 lb

## 2018-04-04 DIAGNOSIS — R202 Paresthesia of skin: Secondary | ICD-10-CM

## 2018-04-04 DIAGNOSIS — I6522 Occlusion and stenosis of left carotid artery: Secondary | ICD-10-CM | POA: Diagnosis not present

## 2018-04-04 DIAGNOSIS — R739 Hyperglycemia, unspecified: Secondary | ICD-10-CM

## 2018-04-04 DIAGNOSIS — R200 Anesthesia of skin: Secondary | ICD-10-CM | POA: Diagnosis not present

## 2018-04-04 DIAGNOSIS — M238X1 Other internal derangements of right knee: Secondary | ICD-10-CM

## 2018-04-04 DIAGNOSIS — M19012 Primary osteoarthritis, left shoulder: Secondary | ICD-10-CM | POA: Diagnosis not present

## 2018-04-04 DIAGNOSIS — M810 Age-related osteoporosis without current pathological fracture: Secondary | ICD-10-CM

## 2018-04-04 DIAGNOSIS — E034 Atrophy of thyroid (acquired): Secondary | ICD-10-CM | POA: Diagnosis not present

## 2018-04-04 DIAGNOSIS — I441 Atrioventricular block, second degree: Secondary | ICD-10-CM

## 2018-04-04 NOTE — Progress Notes (Signed)
Location:  Occupational psychologist of Service:  Clinic (12)  Provider: Arliss Frisina L. Mariea Clonts, D.O., C.M.D.  Code Status: DNR Goals of Care:  Advanced Directives 12/13/2017  Does Patient Have a Medical Advance Directive? Yes  Type of Advance Directive [place]  Does patient want to make changes to medical advance directive? No - Patient declined  Copy of [place] in Chart? Yes   Chief Complaint  Patient presents with  . Medical Management of Chronic Issues    37mth follow-up    HPI: Patient is a 84 y.o. female seen today for medical management of chronic diseases.    Sugar average remains in prediabetic range at 6.3.  Her daughter is 51 and has become diabetic.  We did discuss cutting down on the portions of starches and sweets.  She also exercises less than 2 years ago.    She twisted the right knee walking--saw an orthopedic PA.  Got steroid injection the second time.  It still feels like it will buckle.  Walks in pool--3 hrs per week.  Steroid injection probably didn't help glucose.  She does not eat a lot of meat and cheese does not agree with her.  She gets abdominal pain--is taking the lactaid tablets.    Had an ekg and f/u July which was normal.  Pacemaker interrogation was fine.    Her only worry for me is numbness in her right leg and foot at night and early in the morning.  2.5 years ago, she had a herniated disc surgery. Dr. Ellene Route had told her it would take her 2.5 years to heal.  Nerve pain has gotten less and less in the medial leg.  It's numb in the instep and the back of leg at time.     Past Medical History:  Diagnosis Date  . Allergy   . Arthritis   . 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 Cataract   . Chronic anxiety   . Constipation   . Diverticulosis of sigmoid colon   . Emphysema of lung ([place])   . GERD (gastroesophageal reflux disease)   . Glaucoma    . H/O adenomatous polyp of colon    Dr. Cristina Gong  . High cholesterol   . Hypertension   . Hypothyroidism   . Insomnia   . Lactose intolerance   . Lumbar degenerative disc disease   . Onychomycosis   . Osteoarthritis   . Osteoporosis   . Second degree Mobitz II AV block    s/p PPM  . Spinal stenosis   . Thyroid disease   . Varicose veins     Past Surgical History:  Procedure Laterality Date  . ABDOMINAL HYSTERECTOMY    . BLADDER REPAIR    . blephoplasty bilateral  03/10/2014  . CATARACT EXTRACTION W/ INTRAOCULAR LENS  IMPLANT, BILATERAL  7 & 8 2014  . CHOLECYSTECTOMY    . COLONOSCOPY  05/18/2012  . EP IMPLANTABLE DEVICE N/A 08/16/2016   MDT Advisa MRI conditional PPM implanted by Dr Rayann Heman for mobitz II second degree AV block  . EYE SURGERY    . herniated disc repair    . INCONTINENCE SURGERY    . JOINT REPLACEMENT    . LAMINECTOMY  02/2011   L3-L5  Dr. Ellene Route  . LUMBAR LAMINECTOMY/DECOMPRESSION MICRODISCECTOMY Right 09/21/2015   Procedure: Right Lumbar Three-FourMicrodiskectomy;  Surgeon: Kristeen Miss, MD;  Location: [facility];  Service: Neurosurgery;  Laterality: Right;  Right L3-4 Microdiskectomy  . SHOULDER ARTHROSCOPY WITH ROTATOR CUFF REPAIR Right 08/2003  . TONSILLECTOMY    . TOTAL HIP ARTHROPLASTY     right 1999    Allergies  Allergen Reactions  . Codeine Nausea Only  . Macrobid [Nitrofurantoin] Diarrhea  . Miralax [Polyethylene Glycol] Other (See Comments)    cramping  . Pain Relief [Acetaminophen] Nausea And Vomiting  . Paxil [Paroxetine Hcl] Nausea Only  . Percodan [Oxycodone-Aspirin] Itching  . Oxycodone Nausea Only  . Oxycodone-Acetaminophen Nausea Only  . Penicillins Rash    Has patient had a PCN reaction causing immediate rash, facial/tongue/throat swelling, SOB or lightheadedness with hypotension: YES Has patient had a PCN reaction causing severe rash involving mucus membranes or skin necrosis: YES Has patient had a PCN reaction that required  hospitalization NO Has patient had a PCN reaction occurring within the last 10 years: NO If all of the above answers are "NO", then may proceed with Cephalosporin use.    Outpatient Encounter Medications as of 04/04/2018  Medication Sig  . Calcium-Phosphorus-Vitamin D (CITRACAL +D3) 250-107-500 MG-MG-UNIT CHEW Chew 2 tablets by mouth daily.   . Cyanocobalamin (VITAMIN B 12 PO) Take 1 tablet by mouth daily.  Marland Kitchen diltiazem (CARDIZEM CD) 240 MG 24 hr capsule TAKE 1 CAPSULE EVERY DAY.  Marland Kitchen dorzolamide-timolol (COSOPT) 22.3-6.8 MG/ML ophthalmic solution Place 1 drop into both eyes 2 (two) times daily.  Marland Kitchen ipratropium (ATROVENT) 0.06 % nasal spray USE 2 SPRAYS IN EACH NOSTRIL 3 TIMES A DAY AS NEEDED.  Marland Kitchen LACTASE PO Take 1 tablet by mouth as needed (when eating dairy products). Take as directed  . LUMIGAN 0.01 % SOLN   . Multiple Vitamins-Minerals (MULTIVITAMIN WITH MINERALS) tablet Take 1 tablet by mouth daily.  . Probiotic Product (ALIGN) 4 MG CAPS Take by mouth daily.  . rosuvastatin (CRESTOR) 10 MG tablet TAKE 1/2 TABLET ONCE DAILY FOR CHOLESTEROL  . SYNTHROID 125 MCG tablet TAKE 1 TABLET ONCE DAILY.   No facility-administered encounter medications on file as of 04/04/2018.     Review of Systems:  Review of Systems  Constitutional: Negative for chills and fever.  HENT: Negative for congestion.   Eyes: Negative for blurred vision.  Respiratory: Negative for shortness of breath.   Cardiovascular: Negative for chest pain and palpitations.  Gastrointestinal: Negative for abdominal pain, blood in stool, constipation, diarrhea and melena.       Abdominal pain if eats cheese  Genitourinary: Negative for dysuria.  Musculoskeletal: Positive for joint pain. Negative for back pain, falls, myalgias and neck pain.  Skin: Negative for itching and rash.  Neurological: Positive for sensory change. Negative for dizziness and loss of consciousness.  Psychiatric/Behavioral: Negative for depression and memory  loss. The patient is not nervous/anxious and does not have insomnia.     Health Maintenance  Topic Date Due  . PNA vac Low Risk Adult (2 of 2 - PCV13) 08/29/2014  . INFLUENZA VACCINE  03/29/2018  . TETANUS/TDAP  06/19/2022  . DEXA SCAN  Completed    Physical Exam: Vitals:   04/04/18 1355  BP: 138/68  Pulse: 85  Temp: 99.2 F (37.3 C)  TempSrc: Oral  SpO2: 96%  Weight: 135 lb (61.2 kg)  Height: 5\' 4"  (1.626 m)   Body mass index is 23.17 kg/m. Physical Exam  Constitutional: She is oriented to person, place, and time. She appears well-developed and well-nourished. No distress.  HENT:  Head: Normocephalic and atraumatic.  Cardiovascular:  Normal rate, regular rhythm and intact distal pulses.  Murmur heard. click  Pulmonary/Chest: Effort normal and breath sounds normal. No respiratory distress.  Abdominal: Bowel sounds are normal.  Musculoskeletal: Normal range of motion. She exhibits tenderness.  Tenderness of lateral right knee with proximal effusion present, warmth  Neurological: She is alert and oriented to person, place, and time.  Skin: Skin is warm and dry. Capillary refill takes less than 2 seconds.  Psychiatric: She has a normal mood and affect.    Labs reviewed: Basic Metabolic Panel: Recent Labs    12/05/17 0700 03/27/18 0600  NA 141 142  K 3.6 4.2  BUN 16 22*  CREATININE 0.6 0.5  TSH 1.02  --    Liver Function Tests: Recent Labs    12/05/17  AST 17  ALT 12  ALKPHOS 77   No results for input(s): LIPASE, AMYLASE in the last 8760 hours. No results for input(s): AMMONIA in the last 8760 hours. CBC: Recent Labs    12/05/17  WBC 10.2  HGB 12.7  HCT 39  PLT 345   Lipid Panel: Recent Labs    12/05/17  CHOL 134  HDL 56  LDLCALC 53  TRIG 126   Lab Results  Component Value Date   HGBA1C 6.3 03/27/2018   Assessment/Plan 1. Hyperglycemia -has been fairly stable looking at fasting glucose readings -counseled on dietary changes and  increasing walking in the pool to help low this  2. Joint laxity of right knee -recommended compression sleeve to help -is seeing ortho about this  3. Localized primary osteoarthritis of left shoulder region -doing better after therapy -needs grab bar over toilet area in bathroom  4. Hypothyroidism due to acquired atrophy of thyroid -cont current levothyroxine and monitor, tsh at goal  5. Senile osteoporosis -continues on prolia, due in december  6. Second degree Mobitz II AV block -s/p pacemaker, doing well at last check with cardiology (since she saw me last)  7. Numbness and tingling of right leg -patch of numbness in arch of foot on plantar surface and calf in middle of the night, goes away after walking around for an hour, not currently present and no other numbness or tingling, weakness, temp changes, pain--had prior back surgery for disc and suspect these areas are not returned to normal -she'll let me know if it gets worse  Labs/tests ordered:  No new Next appt:  08/28/2018  Madalene Mickler L. Tavonte Seybold, D.O. [facility] [address], [place] [address] Cell Phone (Mon-Fri 8am-5pm):  [phone] On Call:  [phone] & follow prompts after 5pm & weekends Office Phone:  [phone] Office Fax:  [phone]

## 2018-04-06 ENCOUNTER — Telehealth: Payer: Self-pay | Admitting: Internal Medicine

## 2018-04-06 NOTE — Telephone Encounter (Signed)
I left a message asking the patient to call me at [phone] to schedule AWV with Sara at [facility] on afternoon of 04/17/18 if available. VDM (DD) °

## 2018-04-06 NOTE — Telephone Encounter (Signed)
Scheduled

## 2018-04-10 ENCOUNTER — Other Ambulatory Visit: Payer: Self-pay | Admitting: Internal Medicine

## 2018-04-16 ENCOUNTER — Telehealth: Payer: Self-pay | Admitting: Internal Medicine

## 2018-04-16 NOTE — Telephone Encounter (Signed)
Left msg asking pt to confirm this AWV-S rescheduled from 04/17/18.  Last AWV 03/28/17. VDM (DD)

## 2018-04-16 NOTE — Telephone Encounter (Signed)
The patient called me back and left a message confirming the 05/08/18 appt. VDM (DD)

## 2018-04-23 ENCOUNTER — Other Ambulatory Visit: Payer: Self-pay | Admitting: Internal Medicine

## 2018-04-26 DIAGNOSIS — H401113 Primary open-angle glaucoma, right eye, severe stage: Secondary | ICD-10-CM | POA: Diagnosis not present

## 2018-04-26 DIAGNOSIS — H401122 Primary open-angle glaucoma, left eye, moderate stage: Secondary | ICD-10-CM | POA: Diagnosis not present

## 2018-04-30 LAB — CUP PACEART REMOTE DEVICE CHECK
Battery Remaining Longevity: 112 mo
Brady Statistic AP VS Percent: 2.15 %
Brady Statistic AS VP Percent: 0.03 %
Brady Statistic AS VS Percent: 97.82 %
Brady Statistic RV Percent Paced: 0.03 %
Implantable Lead Implant Date: 20171219
Implantable Lead Location: 753859
Implantable Lead Model: 5076
Implantable Lead Model: 5076
Lead Channel Impedance Value: 342 Ohm
Lead Channel Pacing Threshold Amplitude: 1.375 V
Lead Channel Sensing Intrinsic Amplitude: 3.5 mV
Lead Channel Sensing Intrinsic Amplitude: 3.5 mV
Lead Channel Sensing Intrinsic Amplitude: 6.875 mV
Lead Channel Sensing Intrinsic Amplitude: 6.875 mV
Lead Channel Setting Pacing Amplitude: 2.75 V
Lead Channel Setting Pacing Pulse Width: 0.4 ms
MDC IDC LEAD IMPLANT DT: 20171219
MDC IDC LEAD LOCATION: 753860
MDC IDC MSMT BATTERY VOLTAGE: 3.02 V
MDC IDC MSMT LEADCHNL RA IMPEDANCE VALUE: 323 Ohm
MDC IDC MSMT LEADCHNL RA PACING THRESHOLD AMPLITUDE: 0.75 V
MDC IDC MSMT LEADCHNL RA PACING THRESHOLD PULSEWIDTH: 0.4 ms
MDC IDC MSMT LEADCHNL RV IMPEDANCE VALUE: 342 Ohm
MDC IDC MSMT LEADCHNL RV IMPEDANCE VALUE: 399 Ohm
MDC IDC MSMT LEADCHNL RV PACING THRESHOLD PULSEWIDTH: 0.4 ms
MDC IDC PG IMPLANT DT: 20171219
MDC IDC SESS DTM: 20190724150521
MDC IDC SET LEADCHNL RA PACING AMPLITUDE: 2 V
MDC IDC SET LEADCHNL RV SENSING SENSITIVITY: 2.8 mV
MDC IDC STAT BRADY AP VP PERCENT: 0 %
MDC IDC STAT BRADY RA PERCENT PACED: 2.15 %

## 2018-05-02 DIAGNOSIS — H401113 Primary open-angle glaucoma, right eye, severe stage: Secondary | ICD-10-CM | POA: Diagnosis not present

## 2018-05-02 HISTORY — PX: EYE SURGERY: SHX253

## 2018-05-07 ENCOUNTER — Other Ambulatory Visit: Payer: Self-pay | Admitting: Internal Medicine

## 2018-05-07 ENCOUNTER — Ambulatory Visit
Admission: RE | Admit: 2018-05-07 | Discharge: 2018-05-07 | Disposition: A | Discharge status: Home / Self Care | Payer: Medicare Other | Source: Ambulatory Visit | Attending: Internal Medicine | Admitting: Internal Medicine

## 2018-05-07 DIAGNOSIS — M8588 Other specified disorders of bone density and structure, other site: Secondary | ICD-10-CM | POA: Diagnosis not present

## 2018-05-07 DIAGNOSIS — E034 Atrophy of thyroid (acquired): Secondary | ICD-10-CM

## 2018-05-07 DIAGNOSIS — E739 Lactose intolerance, unspecified: Secondary | ICD-10-CM

## 2018-05-07 DIAGNOSIS — M810 Age-related osteoporosis without current pathological fracture: Secondary | ICD-10-CM

## 2018-05-07 DIAGNOSIS — Z780 Asymptomatic menopausal state: Secondary | ICD-10-CM | POA: Diagnosis not present

## 2018-05-08 ENCOUNTER — Non-Acute Institutional Stay: Payer: Medicare Other

## 2018-05-08 VITALS — BP 138/64 | HR 90 | Temp 98.8°F | Ht 64.0 in | Wt 133.0 lb

## 2018-05-08 DIAGNOSIS — Z0000 Encounter for general adult medical examination without abnormal findings: Secondary | ICD-10-CM | POA: Diagnosis not present

## 2018-05-08 NOTE — Progress Notes (Signed)
Subjective:   Destiny [name] is a 84 y.o. female who presents for Medicare Annual (Subsequent) preventive examination at [facility]  Last AWV-03/28/2017    Objective:     Vitals: BP 138/64 (BP Location: Left Arm, Patient Position: Sitting)   Pulse 90   Temp 98.8 F (37.1 C) (Oral)   Ht 5\' 4"  (1.626 m)   Wt 133 lb (60.3 kg)   SpO2 97%   BMI 22.83 kg/m   Body mass index is 22.83 kg/m.  Advanced Directives 05/08/2018 12/13/2017 08/02/2017 05/24/2017 03/29/2017 03/28/2017 01/11/2017  Does Patient Have a Medical Advance Directive? Yes Yes Yes Yes Yes Yes Yes  Type of [place] administrator Power of [place] of [place];Living will [place];Living will [place]  Does patient want to make changes to medical advance directive? No - Patient declined No - Patient declined No - Patient declined - - No - Patient declined -  Copy of [place] in Chart? Yes Yes Yes Yes Yes Yes Yes    Tobacco Social History   Tobacco Use  Smoking Status Never Smoker  Smokeless Tobacco Never Used     Counseling given: Not Answered   Clinical Intake:  Pre-visit preparation completed: No  Pain : No/denies pain     Nutritional Risks: None Diabetes: No  How often do you need to have someone help you when you read instructions, pamphlets, or other written materials from your doctor or pharmacy?: 1 - Never What is the last grade level you completed in school?: Masters  Interpreter Needed?: No  Information entered by :: Tyson Dense, RN  Past Medical History:  Diagnosis Date  . Allergy   . Arthritis   . 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 Cataract   . Chronic anxiety   . Constipation   . Diverticulosis of sigmoid colon   . Emphysema of lung ([place])     . GERD (gastroesophageal reflux disease)   . Glaucoma   . H/O adenomatous polyp of colon    Dr. Cristina Gong  . High cholesterol   . Hypertension   . Hypothyroidism   . Insomnia   . Lactose intolerance   . Lumbar degenerative disc disease   . Onychomycosis   . Osteoarthritis   . Osteoporosis   . Second degree Mobitz II AV block    s/p PPM  . Spinal stenosis   . Thyroid disease   . Varicose veins    Past Surgical History:  Procedure Laterality Date  . ABDOMINAL HYSTERECTOMY    . BLADDER REPAIR    . blephoplasty bilateral  03/10/2014  . CATARACT EXTRACTION W/ INTRAOCULAR LENS  IMPLANT, BILATERAL  7 & 8 2014  . CHOLECYSTECTOMY    . COLONOSCOPY  05/18/2012  . EP IMPLANTABLE DEVICE N/A 08/16/2016   MDT Advisa MRI conditional PPM implanted by Dr Rayann Heman for mobitz II second degree AV block  . EYE SURGERY Right 05/02/2018   laser eye surgery  . herniated disc repair    . INCONTINENCE SURGERY    . JOINT REPLACEMENT    . LAMINECTOMY  02/2011   L3-L5  Dr. Ellene Route  . LUMBAR LAMINECTOMY/DECOMPRESSION MICRODISCECTOMY Right 09/21/2015   Procedure: Right Lumbar Three-FourMicrodiskectomy;  Surgeon: Kristeen Miss, MD;  Location: [place];  Service: Neurosurgery;  Laterality: Right;  Right L3-4  Microdiskectomy  . SHOULDER ARTHROSCOPY WITH ROTATOR CUFF REPAIR Right 08/2003  . TONSILLECTOMY    . TOTAL HIP ARTHROPLASTY     right 1999   Family History  Problem Relation Age of Onset  . Arthritis Mother   . Osteoporosis Mother   . Heart attack Father    Social History   Socioeconomic History  . Marital status: Widowed    Spouse name: Not on file  . Number of children: Not on file  . Years of education: Not on file  . Highest education level: Not on file  Occupational History  . Not on file  Social Needs  . Financial resource strain: Not hard at all  . Food insecurity:    Worry: Never true    Inability: Never true  . Transportation needs:    Medical: No    Non-medical: No  Tobacco  Use  . Smoking status: Never Smoker  . Smokeless tobacco: Never Used  Substance and Sexual Activity  . Alcohol use: No  . Drug use: No  . Sexual activity: Never  Lifestyle  . Physical activity:    Days per week: 3 days    Minutes per session: 40 min  . Stress: Only a little  Relationships  . Social connections:    Talks on phone: More than three times a week    Gets together: More than three times a week    Attends religious service: Never    Active member of club or organization: No    Attends meetings of clubs or organizations: Never    Relationship status: Widowed  Other Topics Concern  . Not on file  Social History Narrative   Lives at [facility]   Caffeine 1-2 cups daily   Exercise water aerobics, walking   Never smoked   POA         Diet:       Do you drink/ eat things with caffeine? Yes      Marital status:    Widow                           What year were you married ? 1957      Do you live in a house, apartment,assistred living, condo, trailer, etc.)?[facility]      Is it one or more stories?       How many persons live in your home ? 1      Do you have any pets in your home ?(please list)0       Current or past profession: Social Worker      Do you exercise?  Yes                            Type & how often:  Water Aerobic, 3-4 hours per week       Do you have a living will? Yes      Do you have a DNR form? Yes                       If not, do you want to discuss one?       Do you have signed POA?HPOA forms?                 If so, please bring to your        appointment  Outpatient Encounter Medications as of 05/08/2018  Medication Sig  . Calcium-Phosphorus-Vitamin D (CITRACAL +D3) 250-107-500 MG-MG-UNIT CHEW Chew 2 tablets by mouth daily.   . Cyanocobalamin (VITAMIN B 12 PO) Take 1 tablet by mouth daily.  Marland Kitchen diltiazem (CARDIZEM CD) 240 MG 24 hr capsule TAKE 1 CAPSULE EVERY DAY.  Marland Kitchen dorzolamide-timolol  (COSOPT) 22.3-6.8 MG/ML ophthalmic solution Place 1 drop into both eyes 2 (two) times daily.  Marland Kitchen ipratropium (ATROVENT) 0.06 % nasal spray USE 2 SPRAYS IN EACH NOSTRIL 3 TIMES A DAY AS NEEDED.  Marland Kitchen irbesartan-hydrochlorothiazide (AVALIDE) 300-12.5 MG tablet TAKE 1 TABLET EACH DAY.  Marland Kitchen LACTASE PO Take 1 tablet by mouth as needed (when eating dairy products). Take as directed  . LUMIGAN 0.01 % SOLN   . Multiple Vitamins-Minerals (MULTIVITAMIN WITH MINERALS) tablet Take 1 tablet by mouth daily.  . Probiotic Product (ALIGN) 4 MG CAPS Take by mouth daily.  . rosuvastatin (CRESTOR) 10 MG tablet TAKE 1/2 TABLET ONCE DAILY FOR CHOLESTEROL  . SYNTHROID 125 MCG tablet TAKE 1 TABLET ONCE DAILY.   No facility-administered encounter medications on file as of 05/08/2018.     Activities of Daily Living In your present state of health, do you have any difficulty performing the following activities: 05/08/2018  Hearing? N  Vision? N  Difficulty concentrating or making decisions? N  Walking or climbing stairs? N  Dressing or bathing? N  Doing errands, shopping? N  Preparing Food and eating ? N  Using the Toilet? N  In the past six months, have you accidently leaked urine? Y  Do you have problems with loss of bowel control? N  Managing your Medications? N  Managing your Finances? N  Housekeeping or managing your Housekeeping? N  Some recent data might be hidden    Patient Care Team: Gayland Curry, DO as PCP - General (Geriatric Medicine) Thompson Grayer, MD as PCP - Electrophysiology (Cardiology) Deland Pretty, MD (Internal Medicine) Ronald Lobo, MD as Consulting Physician (Gastroenterology) Jari Pigg, MD as Consulting Physician (Dermatology) Shon Hough, MD as Consulting Physician (Ophthalmology) Kristeen Miss, MD as Consulting Physician (Neurosurgery) Justice Britain, MD as Consulting Physician (Orthopedic Surgery) Gaynelle Arabian, MD as Consulting Physician (Orthopedic Surgery) Suella Broad, MD as Consulting Physician (Physical Medicine and Rehabilitation)    Assessment:   This is a routine wellness examination for Destiny [name].  Exercise Activities and Dietary recommendations Current Exercise Habits: Structured exercise class, Type of exercise: Other - see comments(water aerobics), Time (Minutes): 45, Frequency (Times/Week): 3, Weekly Exercise (Minutes/Week): 135, Intensity: Mild, Exercise limited by: None identified  Goals    . Increase water intake     Patient will try to increase water intake       Fall Risk Fall Risk  05/08/2018 04/04/2018 12/13/2017 03/28/2017 05/04/2016  Falls in the past year? No No No No No   Is the patient's home free of loose throw rugs in walkways, pet beds, electrical cords, etc?   yes      Grab bars in the bathroom? yes      Handrails on the stairs?   yes      Adequate lighting?   yes  Depression Screen PHQ 2/9 Scores 05/08/2018 04/04/2018 12/13/2017 03/28/2017  PHQ - 2 Score 0 0 0 0     Cognitive Function MMSE - Mini Mental State Exam 05/08/2018 03/28/2017  Orientation to time 5 5  Orientation to Place 5 5  Registration 3 3  Attention/ Calculation 5 5  Recall 3 3  Language- name  2 objects 2 2  Language- repeat 1 1  Language- follow 3 step command 3 3  Language- read & follow direction 1 1  Write a sentence 1 1  Copy design 1 1  Total score 30 30        Immunization History  Administered Date(s) Administered  . DTaP 06/19/2012  . Influenza,inj,Quad PF,6+ Mos 05/12/2016  . Influenza-Unspecified 08/29/2014, 06/19/2017  . Pneumococcal Polysaccharide-23 12/11/1999  . Pneumococcal-Unspecified 08/29/2013  . Zoster 06/24/2006  . Zoster Recombinat (Shingrix) 03/30/2017, 06/16/2017    Qualifies for Shingles Vaccine? Up to date, completed  Screening Tests Health Maintenance  Topic Date Due  . PNA vac Low Risk Adult (2 of 2 - PCV13) 08/29/2014  . INFLUENZA VACCINE  03/29/2018  . TETANUS/TDAP  06/19/2022  . DEXA SCAN  Completed      Cancer Screenings: Lung: Low Dose CT Chest recommended if Age 60-80 years, 30 pack-year currently smoking OR have quit w/in 15years. Patient does not qualify. Breast:  Up to date on Mammogram? Yes   Up to date of Bone Density/Dexa? Yes Colorectal: up to date  Additional Screenings:  Hepatitis C Screening:declined Flu vaccine due: will receive at [facility]:    I have personally reviewed and addressed the Medicare Annual Wellness questionnaire and have noted the following in the patient's chart:  A. Medical and social history B. Use of alcohol, tobacco or illicit drugs  C. Current medications and supplements D. Functional ability and status E.  Nutritional status F.  Physical activity G. Advance directives H. List of other physicians I.  Hospitalizations, surgeries, and ER visits in previous 12 months J.  [place] to include hearing, vision, cognitive, depression L. Referrals and appointments - none  In addition, I have reviewed and discussed with patient certain preventive protocols, quality metrics, and best practice recommendations. A written personalized care plan for preventive services as well as general preventive health recommendations were provided to patient.  See attached scanned questionnaire for additional information.   Signed,   Tyson Dense, RN Nurse Health Advisor  Patient Concerns: None

## 2018-05-08 NOTE — Patient Instructions (Signed)
[name] , Thank you for taking time to come for your Medicare Wellness Visit. I appreciate your ongoing commitment to your health goals. Please review the following plan we discussed and let me know if I can assist you in the future.   Screening recommendations/referrals: Colonoscopy excluded, over age 82 Mammogram excluded, over age 1 Bone Density up to date Recommended yearly ophthalmology/optometry visit for glaucoma screening and checkup Recommended yearly dental visit for hygiene and checkup  Vaccinations: Influenza vaccine due, please get at a pharmacy or [facility] Pneumococcal vaccine up to date, completed Tdap vaccine up to date, due 06/19/2022 Shingles vaccine up to date, completed    Advanced directives: in chart  Conditions/risks identified: none  Next appointment: Dr. Mariea Clonts 08/15/2018 @ 1:30pm   Preventive Care 48 Years and Older, Female Preventive care refers to lifestyle choices and visits with your health care provider that can promote health and wellness. What does preventive care include?  A yearly physical exam. This is also called an annual well check.  Dental exams once or twice a year.  Routine eye exams. Ask your health care provider how often you should have your eyes checked.  Personal lifestyle choices, including:  Daily care of your teeth and gums.  Regular physical activity.  Eating a healthy diet.  Avoiding tobacco and drug use.  Limiting alcohol use.  Practicing safe sex.  Taking low-dose aspirin every day.  Taking vitamin and mineral supplements as recommended by your health care provider. What happens during an annual well check? The services and screenings done by your health care provider during your annual well check will depend on your age, overall health, lifestyle risk factors, and family history of disease. Counseling  Your health care provider may ask you questions about your:  Alcohol use.  Tobacco use.  Drug  use.  Emotional well-being.  Home and relationship well-being.  Sexual activity.  Eating habits.  History of falls.  Memory and ability to understand (cognition).  Work and work Statistician.  Reproductive health. Screening  You may have the following tests or measurements:  Height, weight, and BMI.  Blood pressure.  Lipid and cholesterol levels. These may be checked every 5 years, or more frequently if you are over 23 years old.  Skin check.  Lung cancer screening. You may have this screening every year starting at age 25 if you have a 30-pack-year history of smoking and currently smoke or have quit within the past 15 years.  Fecal occult blood test (FOBT) of the stool. You may have this test every year starting at age 73.  Flexible sigmoidoscopy or colonoscopy. You may have a sigmoidoscopy every 5 years or a colonoscopy every 10 years starting at age 56.  Hepatitis C blood test.  Hepatitis B blood test.  Sexually transmitted disease (STD) testing.  Diabetes screening. This is done by checking your blood sugar (glucose) after you have not eaten for a while (fasting). You may have this done every 1-3 years.  Bone density scan. This is done to screen for osteoporosis. You may have this done starting at age 27.  Mammogram. This may be done every 1-2 years. Talk to your health care provider about how often you should have regular mammograms. Talk with your health care provider about your test results, treatment options, and if necessary, the need for more tests. Vaccines  Your health care provider may recommend certain vaccines, such as:  Influenza vaccine. This is recommended every year.  Tetanus, diphtheria, and acellular  pertussis (Tdap, Td) vaccine. You may need a Td booster every 10 years.  Zoster vaccine. You may need this after age 90.  Pneumococcal 13-valent conjugate (PCV13) vaccine. One dose is recommended after age 84.  Pneumococcal polysaccharide  (PPSV23) vaccine. One dose is recommended after age 70. Talk to your health care provider about which screenings and vaccines you need and how often you need them. This information is not intended to replace advice given to you by your health care provider. Make sure you discuss any questions you have with your health care provider. Document Released: 09/11/2015 Document Revised: 05/04/2016 Document Reviewed: 06/16/2015 Elsevier Interactive Patient Education  2017 [employer] Prevention in the Home Falls can cause injuries. They can happen to people of all ages. There are many things you can do to make your home safe and to help prevent falls. What can I do on the outside of my home?  Regularly fix the edges of walkways and driveways and fix any cracks.  Remove anything that might make you trip as you walk through a door, such as a raised step or threshold.  Trim any bushes or trees on the path to your home.  Use bright outdoor lighting.  Clear any walking paths of anything that might make someone trip, such as rocks or tools.  Regularly check to see if handrails are loose or broken. Make sure that both sides of any steps have handrails.  Any raised decks and porches should have guardrails on the edges.  Have any leaves, snow, or ice cleared regularly.  Use sand or salt on walking paths during winter.  Clean up any spills in your garage right away. This includes oil or grease spills. What can I do in the bathroom?  Use night lights.  Install grab bars by the toilet and in the tub and shower. Do not use towel bars as grab bars.  Use non-skid mats or decals in the tub or shower.  If you need to sit down in the shower, use a plastic, non-slip stool.  Keep the floor dry. Clean up any water that spills on the floor as soon as it happens.  Remove soap buildup in the tub or shower regularly.  Attach bath mats securely with double-sided non-slip rug tape.  Do not have  throw rugs and other things on the floor that can make you trip. What can I do in the bedroom?  Use night lights.  Make sure that you have a light by your bed that is easy to reach.  Do not use any sheets or blankets that are too big for your bed. They should not hang down onto the floor.  Have a firm chair that has side arms. You can use this for support while you get dressed.  Do not have throw rugs and other things on the floor that can make you trip. What can I do in the kitchen?  Clean up any spills right away.  Avoid walking on wet floors.  Keep items that you use a lot in easy-to-reach places.  If you need to reach something above you, use a strong step stool that has a grab bar.  Keep electrical cords out of the way.  Do not use floor polish or wax that makes floors slippery. If you must use wax, use non-skid floor wax.  Do not have throw rugs and other things on the floor that can make you trip. What can I do with my stairs?  Do not leave any items on the stairs.  Make sure that there are handrails on both sides of the stairs and use them. Fix handrails that are broken or loose. Make sure that handrails are as long as the stairways.  Check any carpeting to make sure that it is firmly attached to the stairs. Fix any carpet that is loose or worn.  Avoid having throw rugs at the top or bottom of the stairs. If you do have throw rugs, attach them to the floor with carpet tape.  Make sure that you have a light switch at the top of the stairs and the bottom of the stairs. If you do not have them, ask someone to add them for you. What else can I do to help prevent falls?  Wear shoes that:  Do not have high heels.  Have rubber bottoms.  Are comfortable and fit you well.  Are closed at the toe. Do not wear sandals.  If you use a stepladder:  Make sure that it is fully opened. Do not climb a closed stepladder.  Make sure that both sides of the stepladder are  locked into place.  Ask someone to hold it for you, if possible.  Clearly mark and make sure that you can see:  Any grab bars or handrails.  First and last steps.  Where the edge of each step is.  Use tools that help you move around (mobility aids) if they are needed. These include:  Canes.  Walkers.  Scooters.  Crutches.  Turn on the lights when you go into a dark area. Replace any light bulbs as soon as they burn out.  Set up your furniture so you have a clear path. Avoid moving your furniture around.  If any of your floors are uneven, fix them.  If there are any pets around you, be aware of where they are.  Review your medicines with your doctor. Some medicines can make you feel dizzy. This can increase your chance of falling. Ask your doctor what other things that you can do to help prevent falls. This information is not intended to replace advice given to you by your health care provider. Make sure you discuss any questions you have with your health care provider. Document Released: 06/11/2009 Document Revised: 01/21/2016 Document Reviewed: 09/19/2014 Elsevier Interactive Patient Education  2017 [employer].

## 2018-05-10 ENCOUNTER — Encounter: Payer: Self-pay | Admitting: *Deleted

## 2018-06-07 DIAGNOSIS — H401113 Primary open-angle glaucoma, right eye, severe stage: Secondary | ICD-10-CM | POA: Diagnosis not present

## 2018-06-10 ENCOUNTER — Other Ambulatory Visit: Payer: Self-pay | Admitting: Internal Medicine

## 2018-06-20 ENCOUNTER — Ambulatory Visit (INDEPENDENT_AMBULATORY_CARE_PROVIDER_SITE_OTHER): Payer: Medicare Other | Admitting: *Deleted

## 2018-06-20 ENCOUNTER — Other Ambulatory Visit: Payer: Self-pay | Admitting: Internal Medicine

## 2018-06-20 DIAGNOSIS — I441 Atrioventricular block, second degree: Secondary | ICD-10-CM

## 2018-06-20 NOTE — Progress Notes (Signed)
Remote pacemaker transmission.   

## 2018-06-21 ENCOUNTER — Encounter: Payer: Self-pay | Admitting: Cardiology

## 2018-06-22 ENCOUNTER — Other Ambulatory Visit: Payer: Self-pay | Admitting: Internal Medicine

## 2018-06-22 DIAGNOSIS — Z23 Encounter for immunization: Secondary | ICD-10-CM | POA: Diagnosis not present

## 2018-06-23 ENCOUNTER — Other Ambulatory Visit: Payer: Self-pay | Admitting: Internal Medicine

## 2018-06-26 DIAGNOSIS — L821 Other seborrheic keratosis: Secondary | ICD-10-CM | POA: Diagnosis not present

## 2018-06-26 DIAGNOSIS — L720 Epidermal cyst: Secondary | ICD-10-CM | POA: Diagnosis not present

## 2018-06-26 DIAGNOSIS — L299 Pruritus, unspecified: Secondary | ICD-10-CM | POA: Diagnosis not present

## 2018-06-26 DIAGNOSIS — L570 Actinic keratosis: Secondary | ICD-10-CM | POA: Diagnosis not present

## 2018-06-26 DIAGNOSIS — Z23 Encounter for immunization: Secondary | ICD-10-CM | POA: Diagnosis not present

## 2018-06-26 DIAGNOSIS — D225 Melanocytic nevi of trunk: Secondary | ICD-10-CM | POA: Diagnosis not present

## 2018-06-26 DIAGNOSIS — K141 Geographic tongue: Secondary | ICD-10-CM | POA: Diagnosis not present

## 2018-06-26 DIAGNOSIS — Z85828 Personal history of other malignant neoplasm of skin: Secondary | ICD-10-CM | POA: Diagnosis not present

## 2018-07-04 ENCOUNTER — Telehealth: Payer: Self-pay

## 2018-07-04 ENCOUNTER — Encounter: Payer: Self-pay | Admitting: Internal Medicine

## 2018-07-04 ENCOUNTER — Other Ambulatory Visit: Payer: Self-pay | Admitting: Internal Medicine

## 2018-07-04 ENCOUNTER — Non-Acute Institutional Stay: Payer: Medicare Other | Admitting: Internal Medicine

## 2018-07-04 VITALS — BP 128/60 | HR 69 | Temp 98.6°F | Ht 64.0 in | Wt 135.0 lb

## 2018-07-04 DIAGNOSIS — M62838 Other muscle spasm: Secondary | ICD-10-CM | POA: Diagnosis not present

## 2018-07-04 DIAGNOSIS — M542 Cervicalgia: Secondary | ICD-10-CM

## 2018-07-04 DIAGNOSIS — I6522 Occlusion and stenosis of left carotid artery: Secondary | ICD-10-CM | POA: Diagnosis not present

## 2018-07-04 MED ORDER — METHOCARBAMOL 500 MG PO TABS: 500.0000 mg | ORAL_TABLET | Freq: Three times a day (TID) | ORAL | 0 refills | Status: DC | PRN

## 2018-07-04 NOTE — Telephone Encounter (Signed)
Rx was sent into [facility]

## 2018-07-04 NOTE — Telephone Encounter (Signed)
Spoke with daughter and advised results. rx sent to pharmacy by e-script  

## 2018-07-04 NOTE — Progress Notes (Signed)
Location:  [facility] clinic Provider: Lachae Hohler L. Mariea Clonts, D.O., C.M.D.  Code Status: DNR Goals of Care:  Advanced Directives 05/08/2018  Does Patient Have a Medical Advance Directive? Yes  Type of Advance Directive [place]  Does patient want to make changes to medical advance directive? No - Patient declined  Copy of [place] in Chart? Yes   Chief Complaint  Patient presents with  . Acute Visit    pain in neck x 2weeks    HPI: Patient is a 84 y.o. female with h/o osteoarthritis of her thumb, lower back seen today for an acute visit for neck pain for 11 days.  She woke up one morning with it.  She has used heat and cold, but it was gradually getting worse on the back of the right side of her neck.  She's been putting lidocaine.  The tried diclofenac gel from last year for her shoulder.  Saw the nurse on duty on Saturday and she suggested advil, but she got diarrhea from it.  Had been doing aleve before that.  Discussed muscle relaxants but she's afraid of side effects of falls, confusion, sleepiness, accidents with driving so we initially opted not to try, but then she must have called her daughter, Benjamine Mola, and said she was in excruciating pain and she called [place] to request muscle relaxer for her mother.   Past Medical History:  Diagnosis Date  . Allergy   . Arthritis   . 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 Cataract   . Chronic anxiety   . Constipation   . Diverticulosis of sigmoid colon   . Emphysema of lung ([place])   . GERD (gastroesophageal reflux disease)   . Glaucoma   . H/O adenomatous polyp of colon    Dr. Cristina Gong  . High cholesterol   . Hypertension   . Hypothyroidism   . Insomnia   . Lactose intolerance   . Lumbar degenerative disc disease   . Onychomycosis   . Osteoarthritis   . Osteoporosis   . Second degree Mobitz II AV block    s/p PPM  . Spinal stenosis   . Thyroid  disease   . Varicose veins     Past Surgical History:  Procedure Laterality Date  . ABDOMINAL HYSTERECTOMY    . BLADDER REPAIR    . blephoplasty bilateral  03/10/2014  . CATARACT EXTRACTION W/ INTRAOCULAR LENS  IMPLANT, BILATERAL  7 & 8 2014  . CHOLECYSTECTOMY    . COLONOSCOPY  05/18/2012  . EP IMPLANTABLE DEVICE N/A 08/16/2016   MDT [facility]vis[facility] MRI conditional PPM implanted by Dr Rayann Heman for mobitz II second degree AV block  . EYE SURGERY Right 05/02/2018   laser eye surgery  . herniated disc repair    . INCONTINENCE SURGERY    . JOINT REPLACEMENT    . LAMINECTOMY  02/2011   L3-L5  Dr. Ellene Route  . LUMBAR LAMINECTOMY/DECOMPRESSION MICRODISCECTOMY Right 09/21/2015   Procedure: Right Lumbar Three-FourMicrodiskectomy;  Surgeon: Kristeen Miss, MD;  Location: [facility];  Service: Neurosurgery;  Laterality: Right;  Right L3-4 Microdiskectomy  . SHOULDER ARTHROSCOPY WITH ROTATOR CUFF REPAIR Right 08/2003  . TONSILLECTOMY    . TOTAL HIP ARTHROPLASTY     right 1999    Allergies  Allergen Reactions  . Codeine Nausea Only  . Macrobid [Nitrofurantoin] Diarrhea  . Miralax [Polyethylene Glycol] Other (See Comments)    cramping  .  Pain Relief [Acetaminophen] Nausea And Vomiting  . Paxil [Paroxetine Hcl] Nausea Only  . Percodan [Oxycodone-Aspirin] Itching  . Oxycodone Nausea Only  . Oxycodone-Acetaminophen Nausea Only  . Penicillins Rash    Has patient had a PCN reaction causing immediate rash, facial/tongue/throat swelling, SOB or lightheadedness with hypotension: YES Has patient had a PCN reaction causing severe rash involving mucus membranes or skin necrosis: YES Has patient had a PCN reaction that required hospitalization NO Has patient had a PCN reaction occurring within the last 10 years: NO If all of the above answers are "NO", then may proceed with Cephalosporin use.    Outpatient Encounter Medications as of 07/04/2018  Medication Sig  . Calcium-Phosphorus-Vitamin D (CITRACAL +D3)  250-107-500 MG-MG-UNIT CHEW Chew 2 tablets by mouth daily.   . Cyanocobalamin (VITAMIN B 12 PO) Take 1 tablet by mouth daily.  Marland Kitchen diltiazem (CARDIZEM CD) 240 MG 24 hr capsule TAKE 1 CAPSULE EVERY DAY.  Marland Kitchen dorzolamide-timolol (COSOPT) 22.3-6.8 MG/ML ophthalmic solution Place 1 drop into both eyes 2 (two) times daily.  Marland Kitchen ipratropium (ATROVENT) 0.06 % nasal spray USE 2 SPRAYS IN EACH NOSTRIL 3 TIMES A DAY AS NEEDED.  Marland Kitchen irbesartan-hydrochlorothiazide (AVALIDE) 300-12.5 MG tablet TAKE 1 TABLET EACH DAY.  Marland Kitchen LACTASE PO Take 1 tablet by mouth as needed (when eating dairy products). Take as directed  . LUMIGAN 0.01 % SOLN   . Multiple Vitamins-Minerals (MULTIVITAMIN WITH MINERALS) tablet Take 1 tablet by mouth daily.  . Probiotic Product (ALIGN) 4 MG CAPS Take by mouth daily.  . rosuvastatin (CRESTOR) 10 MG tablet TAKE 1/2 TABLET ONCE DAILY FOR CHOLESTEROL  . SYNTHROID 125 MCG tablet TAKE 1 TABLET ONCE DAILY.   No facility-administered encounter medications on file as of 07/04/2018.     Review of Systems:  Review of Systems  Constitutional: Negative for chills and fever.  HENT: Negative for congestion.   Eyes: Negative for blurred vision.  Respiratory: Negative for shortness of breath.   Cardiovascular: Negative for chest pain and palpitations.  Gastrointestinal: Negative for abdominal pain.  Genitourinary: Negative for dysuria.  Musculoskeletal: Positive for myalgias and neck pain. Negative for falls and joint pain.  Neurological: Negative for dizziness and loss of consciousness.  Psychiatric/Behavioral: Negative for depression and memory loss. The patient is not nervous/anxious.     Health Maintenance  Topic Date Due  . PNA vac Low Risk Adult (2 of 2 - PCV13) 08/29/2014  . TETANUS/TDAP  06/19/2022  . INFLUENZA VACCINE  Completed  . DEXA SCAN  Completed    Physical Exam: Vitals:   07/04/18 1149  BP: 128/60  Pulse: 69  Temp: 98.6 F (37 C)  TempSrc: Oral  SpO2: 96%  Weight: 135 lb  (61.2 kg)  Height: 5\' 4"  (1.626 m)   Body mass index is 23.17 kg/m. Physical Exam  Constitutional: She is oriented to person, place, and time. She appears well-developed and well-nourished. No distress.  Cardiovascular: Normal rate, regular rhythm, normal heart sounds and intact distal pulses.  Pulmonary/Chest: Effort normal and breath sounds normal. No respiratory distress.  Musculoskeletal:  Tenderness of right side of neck paravertebral muscles and around C2-3 transverse processes; decreased rotation and sidebending right; normal to left, normal flexion and extension  Neurological: She is alert and oriented to person, place, and time.  Skin: Skin is warm and dry.  Psychiatric: She has a normal mood and affect.    Labs reviewed: Basic Metabolic Panel: Recent Labs    12/05/17 0700 03/27/18  NA 141 142  K 3.6 4.2  BUN 16 22*  CREATININE 0.6 0.5  TSH 1.02  --    Liver Function Tests: Recent Labs    12/05/17  AST 17  ALT 12  ALKPHOS 77   No results for input(s): LIPASE, AMYLASE in the last 8760 hours. No results for input(s): AMMONIA in the last 8760 hours. CBC: Recent Labs    12/05/17  WBC 10.2  HGB 12.7  HCT 39  PLT 345   Lipid Panel: Recent Labs    12/05/17  CHOL 134  HDL 56  LDLCALC 53  TRIG 126   Lab Results  Component Value Date   HGBA1C 6.3 03/27/2018     Assessment/Plan 1. Cervical paraspinal muscle spasm -recommended continued topical therapy with diclofenac or her aspercreme with lidocaine, may use heat at separate times; [facility] given [facility] therapeutic modalities and stretching -later her daughter called asking for muscle relaxers for her mother--robaxin 500mg  po tid prn was prescribed #30, but I had reviewed risks of this class of meds in older adults and warnings put in Rx instructions to avoid driving and move slowly to avoid falls  Labs/tests ordered:  Therapy referral Next appt:  08/15/2018  Kassidy Dockendorf L. Tiasha Helvie,  D.O. [facility] [address], [place] [address] Cell Phone (Mon-Fri 8am-5pm):  [phone] On Call:  [phone] & follow prompts after 5pm & weekends Office Phone:  [phone] Office Fax:  [phone]

## 2018-07-04 NOTE — Telephone Encounter (Signed)
Incoming call received by patient's daughter Benjamine Mola. Benjamine Mola states he mother seen Dr.Reed today and they discussed a muscle relaxer in which the patient refused. Patient is in excruciating pain and would like to reconsider muscle relaxer.   Ok to send rx to [facility] and call [place] when complete.   S.Chrae B/CMA

## 2018-07-05 DIAGNOSIS — H401113 Primary open-angle glaucoma, right eye, severe stage: Secondary | ICD-10-CM | POA: Diagnosis not present

## 2018-07-05 DIAGNOSIS — Z961 Presence of intraocular lens: Secondary | ICD-10-CM | POA: Diagnosis not present

## 2018-07-11 DIAGNOSIS — M542 Cervicalgia: Secondary | ICD-10-CM | POA: Diagnosis not present

## 2018-07-11 DIAGNOSIS — M47892 Other spondylosis, cervical region: Secondary | ICD-10-CM | POA: Diagnosis not present

## 2018-07-11 DIAGNOSIS — M6389 Disorders of muscle in diseases classified elsewhere, multiple sites: Secondary | ICD-10-CM | POA: Diagnosis not present

## 2018-07-11 DIAGNOSIS — M5033 Other cervical disc degeneration, cervicothoracic region: Secondary | ICD-10-CM | POA: Diagnosis not present

## 2018-07-12 LAB — CUP PACEART REMOTE DEVICE CHECK
Battery Voltage: 3.01 V
Brady Statistic AP VP Percent: 0.65 %
Brady Statistic RA Percent Paced: 3.69 %
Brady Statistic RV Percent Paced: 8.8 %
Implantable Lead Implant Date: 20171219
Implantable Lead Location: 753860
Implantable Lead Model: 5076
Implantable Lead Model: 5076
Implantable Pulse Generator Implant Date: 20171219
Lead Channel Impedance Value: 361 Ohm
Lead Channel Impedance Value: 361 Ohm
Lead Channel Impedance Value: 418 Ohm
Lead Channel Pacing Threshold Amplitude: 0.625 V
Lead Channel Pacing Threshold Amplitude: 1 V
Lead Channel Pacing Threshold Pulse Width: 0.4 ms
Lead Channel Pacing Threshold Pulse Width: 0.4 ms
Lead Channel Setting Pacing Amplitude: 2 V
Lead Channel Setting Sensing Sensitivity: 2.8 mV
MDC IDC LEAD IMPLANT DT: 20171219
MDC IDC LEAD LOCATION: 753859
MDC IDC MSMT BATTERY REMAINING LONGEVITY: 102 mo
MDC IDC MSMT LEADCHNL RA IMPEDANCE VALUE: 323 Ohm
MDC IDC MSMT LEADCHNL RA SENSING INTR AMPL: 3.125 mV
MDC IDC MSMT LEADCHNL RA SENSING INTR AMPL: 3.125 mV
MDC IDC MSMT LEADCHNL RV SENSING INTR AMPL: 10.125 mV
MDC IDC MSMT LEADCHNL RV SENSING INTR AMPL: 10.125 mV
MDC IDC SESS DTM: 20191023130923
MDC IDC SET LEADCHNL RV PACING AMPLITUDE: 2.5 V
MDC IDC SET LEADCHNL RV PACING PULSEWIDTH: 0.4 ms
MDC IDC STAT BRADY AP VS PERCENT: 3.05 %
MDC IDC STAT BRADY AS VP PERCENT: 8.16 %
MDC IDC STAT BRADY AS VS PERCENT: 88.14 %

## 2018-07-13 DIAGNOSIS — M5033 Other cervical disc degeneration, cervicothoracic region: Secondary | ICD-10-CM | POA: Diagnosis not present

## 2018-07-13 DIAGNOSIS — M542 Cervicalgia: Secondary | ICD-10-CM | POA: Diagnosis not present

## 2018-07-13 DIAGNOSIS — M6389 Disorders of muscle in diseases classified elsewhere, multiple sites: Secondary | ICD-10-CM | POA: Diagnosis not present

## 2018-07-13 DIAGNOSIS — M47892 Other spondylosis, cervical region: Secondary | ICD-10-CM | POA: Diagnosis not present

## 2018-07-16 DIAGNOSIS — M542 Cervicalgia: Secondary | ICD-10-CM | POA: Diagnosis not present

## 2018-07-16 DIAGNOSIS — M5033 Other cervical disc degeneration, cervicothoracic region: Secondary | ICD-10-CM | POA: Diagnosis not present

## 2018-07-16 DIAGNOSIS — M47892 Other spondylosis, cervical region: Secondary | ICD-10-CM | POA: Diagnosis not present

## 2018-07-16 DIAGNOSIS — M6389 Disorders of muscle in diseases classified elsewhere, multiple sites: Secondary | ICD-10-CM | POA: Diagnosis not present

## 2018-07-18 ENCOUNTER — Other Ambulatory Visit: Payer: Self-pay | Admitting: Internal Medicine

## 2018-07-19 DIAGNOSIS — M47892 Other spondylosis, cervical region: Secondary | ICD-10-CM | POA: Diagnosis not present

## 2018-07-19 DIAGNOSIS — M6389 Disorders of muscle in diseases classified elsewhere, multiple sites: Secondary | ICD-10-CM | POA: Diagnosis not present

## 2018-07-19 DIAGNOSIS — M542 Cervicalgia: Secondary | ICD-10-CM | POA: Diagnosis not present

## 2018-07-19 DIAGNOSIS — M5033 Other cervical disc degeneration, cervicothoracic region: Secondary | ICD-10-CM | POA: Diagnosis not present

## 2018-07-23 DIAGNOSIS — D649 Anemia, unspecified: Secondary | ICD-10-CM | POA: Diagnosis not present

## 2018-07-23 DIAGNOSIS — K589 Irritable bowel syndrome without diarrhea: Secondary | ICD-10-CM | POA: Diagnosis not present

## 2018-07-23 DIAGNOSIS — R140 Abdominal distension (gaseous): Secondary | ICD-10-CM | POA: Diagnosis not present

## 2018-07-23 DIAGNOSIS — R197 Diarrhea, unspecified: Secondary | ICD-10-CM | POA: Diagnosis not present

## 2018-07-23 DIAGNOSIS — R109 Unspecified abdominal pain: Secondary | ICD-10-CM | POA: Diagnosis not present

## 2018-07-24 DIAGNOSIS — M542 Cervicalgia: Secondary | ICD-10-CM | POA: Diagnosis not present

## 2018-07-24 DIAGNOSIS — M47892 Other spondylosis, cervical region: Secondary | ICD-10-CM | POA: Diagnosis not present

## 2018-07-24 DIAGNOSIS — M5033 Other cervical disc degeneration, cervicothoracic region: Secondary | ICD-10-CM | POA: Diagnosis not present

## 2018-07-24 DIAGNOSIS — M6389 Disorders of muscle in diseases classified elsewhere, multiple sites: Secondary | ICD-10-CM | POA: Diagnosis not present

## 2018-07-30 DIAGNOSIS — R197 Diarrhea, unspecified: Secondary | ICD-10-CM | POA: Diagnosis not present

## 2018-07-30 DIAGNOSIS — K589 Irritable bowel syndrome without diarrhea: Secondary | ICD-10-CM | POA: Diagnosis not present

## 2018-07-30 DIAGNOSIS — R109 Unspecified abdominal pain: Secondary | ICD-10-CM | POA: Diagnosis not present

## 2018-07-30 DIAGNOSIS — K625 Hemorrhage of anus and rectum: Secondary | ICD-10-CM | POA: Diagnosis not present

## 2018-07-30 DIAGNOSIS — D500 Iron deficiency anemia secondary to blood loss (chronic): Secondary | ICD-10-CM | POA: Diagnosis not present

## 2018-08-02 ENCOUNTER — Other Ambulatory Visit: Payer: Self-pay | Admitting: *Deleted

## 2018-08-02 DIAGNOSIS — M47892 Other spondylosis, cervical region: Secondary | ICD-10-CM | POA: Diagnosis not present

## 2018-08-02 DIAGNOSIS — M542 Cervicalgia: Secondary | ICD-10-CM | POA: Diagnosis not present

## 2018-08-02 DIAGNOSIS — M6389 Disorders of muscle in diseases classified elsewhere, multiple sites: Secondary | ICD-10-CM | POA: Diagnosis not present

## 2018-08-02 DIAGNOSIS — M5033 Other cervical disc degeneration, cervicothoracic region: Secondary | ICD-10-CM | POA: Diagnosis not present

## 2018-08-02 MED ORDER — DILTIAZEM HCL ER COATED BEADS 240 MG PO CP24: 240.0000 mg | ORAL_CAPSULE | Freq: Every day | ORAL | 1 refills | Status: DC

## 2018-08-02 MED ORDER — IPRATROPIUM BROMIDE 0.06 % NA SOLN: NASAL | 0 refills | Status: DC

## 2018-08-02 NOTE — Telephone Encounter (Signed)
[facility]  

## 2018-08-06 DIAGNOSIS — K644 Residual hemorrhoidal skin tags: Secondary | ICD-10-CM | POA: Diagnosis not present

## 2018-08-06 DIAGNOSIS — K5901 Slow transit constipation: Secondary | ICD-10-CM | POA: Diagnosis not present

## 2018-08-06 DIAGNOSIS — K589 Irritable bowel syndrome without diarrhea: Secondary | ICD-10-CM | POA: Diagnosis not present

## 2018-08-08 DIAGNOSIS — M6389 Disorders of muscle in diseases classified elsewhere, multiple sites: Secondary | ICD-10-CM | POA: Diagnosis not present

## 2018-08-08 DIAGNOSIS — M542 Cervicalgia: Secondary | ICD-10-CM | POA: Diagnosis not present

## 2018-08-08 DIAGNOSIS — M47892 Other spondylosis, cervical region: Secondary | ICD-10-CM | POA: Diagnosis not present

## 2018-08-08 DIAGNOSIS — M5033 Other cervical disc degeneration, cervicothoracic region: Secondary | ICD-10-CM | POA: Diagnosis not present

## 2018-08-14 DIAGNOSIS — M542 Cervicalgia: Secondary | ICD-10-CM | POA: Diagnosis not present

## 2018-08-14 DIAGNOSIS — M5033 Other cervical disc degeneration, cervicothoracic region: Secondary | ICD-10-CM | POA: Diagnosis not present

## 2018-08-14 DIAGNOSIS — M6389 Disorders of muscle in diseases classified elsewhere, multiple sites: Secondary | ICD-10-CM | POA: Diagnosis not present

## 2018-08-14 DIAGNOSIS — M47892 Other spondylosis, cervical region: Secondary | ICD-10-CM | POA: Diagnosis not present

## 2018-08-15 ENCOUNTER — Non-Acute Institutional Stay: Payer: Medicare Other | Admitting: Internal Medicine

## 2018-08-15 ENCOUNTER — Encounter: Payer: Self-pay | Admitting: Internal Medicine

## 2018-08-15 VITALS — BP 122/60 | HR 76 | Temp 98.4°F | Ht 63.0 in | Wt 135.0 lb

## 2018-08-15 DIAGNOSIS — R739 Hyperglycemia, unspecified: Secondary | ICD-10-CM

## 2018-08-15 DIAGNOSIS — M810 Age-related osteoporosis without current pathological fracture: Secondary | ICD-10-CM | POA: Diagnosis not present

## 2018-08-15 DIAGNOSIS — E739 Lactose intolerance, unspecified: Secondary | ICD-10-CM

## 2018-08-15 DIAGNOSIS — I6522 Occlusion and stenosis of left carotid artery: Secondary | ICD-10-CM | POA: Diagnosis not present

## 2018-08-15 DIAGNOSIS — G8929 Other chronic pain: Secondary | ICD-10-CM | POA: Diagnosis not present

## 2018-08-15 DIAGNOSIS — K529 Noninfective gastroenteritis and colitis, unspecified: Secondary | ICD-10-CM | POA: Diagnosis not present

## 2018-08-15 DIAGNOSIS — M62838 Other muscle spasm: Secondary | ICD-10-CM | POA: Diagnosis not present

## 2018-08-15 DIAGNOSIS — M5441 Lumbago with sciatica, right side: Secondary | ICD-10-CM | POA: Diagnosis not present

## 2018-08-15 NOTE — Progress Notes (Signed)
Location:  Occupational psychologist of Service:  Clinic (12)  Provider: Deseree Zemaitis L. Mariea Clonts, D.O., C.M.D.  Code Status: FULL CODE Goals of Care:  Advanced Directives 05/08/2018  Does Patient Have a Medical Advance Directive? Yes  Type of Advance Directive [place]  Does patient want to make changes to medical advance directive? No - Patient declined  Copy of [place] in Chart? Yes     Chief Complaint  Patient presents with  . Medical Management of Chronic Issues    62mth follow-up    HPI: Patient is a 84 y.o. female seen today for medical management of chronic diseases.    Says she's been better. Having some little problems. Says she is having stomach cramps after eating since early November.  Had a bout of diarrhea for 9 days.  Saw a PA at Dr. Osborn Coho office.  They felt she had an infection.  She got IB guard, gasx.  IBgard helps some but still having cramping.  No fever.  It involved her whole abdomen, no particular sore spots.  "blew up like a tick."  She also had small hemorrhoids that bled in the midst of this.    She was using imodium daily for 9 days, but then it quit acting up.  She is better now.    She is having a backache all the time.  Her neck is getting better.  The neurosurgeon told her she had spondylolisthesis at L3-4 and 4-5 that was not repaired.  It aches all day.  It's better if she sits down or lies down.  She cannot stand for a long time.  The pain going up her right leg from her foot has come back more lately.  She had that even after her herniated disc.  She's also not walking and exercising as much.  If her back hurts really bad, she takes two aleve once a day.  Lately, it's bothered her 7 days a week especially if it rains.    Says her brain is getting old.     Past Medical History:  Diagnosis Date  . Allergy   . Arthritis   . 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 Cataract   . Chronic anxiety   . Constipation   . Diverticulosis of sigmoid colon   . Emphysema of lung ([place])   . GERD (gastroesophageal reflux disease)   . Glaucoma   . H/O adenomatous polyp of colon    Dr. Cristina Gong  . High cholesterol   . Hypertension   . Hypothyroidism   . Insomnia   . Lactose intolerance   . Lumbar degenerative disc disease   . Onychomycosis   . Osteoarthritis   . Osteoporosis   . Second degree Mobitz II AV block    s/p PPM  . Spinal stenosis   . Thyroid disease   . Varicose veins     Past Surgical History:  Procedure Laterality Date  . ABDOMINAL HYSTERECTOMY    . BLADDER REPAIR    . blephoplasty bilateral  03/10/2014  . CATARACT EXTRACTION W/ INTRAOCULAR LENS  IMPLANT, BILATERAL  7 & 8 2014  . CHOLECYSTECTOMY    . COLONOSCOPY  05/18/2012  . EP IMPLANTABLE DEVICE N/A 08/16/2016   MDT Advisa MRI conditional PPM implanted by Dr Rayann Heman for mobitz II second degree AV block  . EYE SURGERY Right 05/02/2018   laser eye surgery  .  herniated disc repair    . INCONTINENCE SURGERY    . JOINT REPLACEMENT    . LAMINECTOMY  02/2011   L3-L5  Dr. Ellene Route  . LUMBAR LAMINECTOMY/DECOMPRESSION MICRODISCECTOMY Right 09/21/2015   Procedure: Right Lumbar Three-FourMicrodiskectomy;  Surgeon: Kristeen Miss, MD;  Location: [facility];  Service: Neurosurgery;  Laterality: Right;  Right L3-4 Microdiskectomy  . SHOULDER ARTHROSCOPY WITH ROTATOR CUFF REPAIR Right 08/2003  . TONSILLECTOMY    . TOTAL HIP ARTHROPLASTY     right 1999    Allergies  Allergen Reactions  . Codeine Nausea Only  . Macrobid [Nitrofurantoin] Diarrhea  . Miralax [Polyethylene Glycol] Other (See Comments)    cramping  . Pain Relief [Acetaminophen] Nausea And Vomiting  . Paxil [Paroxetine Hcl] Nausea Only  . Percodan [Oxycodone-Aspirin] Itching  . Oxycodone Nausea Only  . Oxycodone-Acetaminophen Nausea Only  . Penicillins Rash    Has patient had a PCN reaction causing  immediate rash, facial/tongue/throat swelling, SOB or lightheadedness with hypotension: YES Has patient had a PCN reaction causing severe rash involving mucus membranes or skin necrosis: YES Has patient had a PCN reaction that required hospitalization NO Has patient had a PCN reaction occurring within the last 10 years: NO If all of the above answers are "NO", then may proceed with Cephalosporin use.    Outpatient Encounter Medications as of 08/15/2018  Medication Sig  . Calcium-Phosphorus-Vitamin D (CITRACAL +D3) 250-107-500 MG-MG-UNIT CHEW Chew 2 tablets by mouth daily.   . Cyanocobalamin (VITAMIN B 12 PO) Take 1 tablet by mouth daily.  Marland Kitchen diltiazem (CARDIZEM CD) 240 MG 24 hr capsule Take 1 capsule (240 mg total) by mouth daily.  . dorzolamide-timolol (COSOPT) 22.3-6.8 MG/ML ophthalmic solution Place 1 drop into both eyes 2 (two) times daily.  Marland Kitchen ipratropium (ATROVENT) 0.06 % nasal spray Use 2 sprays in each nostril three times daily as needed  . irbesartan-hydrochlorothiazide (AVALIDE) 300-12.5 MG tablet TAKE 1 TABLET EACH DAY.  Marland Kitchen LACTASE PO Take 1 tablet by mouth as needed (when eating dairy products). Take as directed  . LUMIGAN 0.01 % SOLN   . Multiple Vitamins-Minerals (MULTIVITAMIN WITH MINERALS) tablet Take 1 tablet by mouth daily.  Mckinley Jewel Dimesylate (RHOPRESSA) 0.02 % SOLN Apply 1 drop to eye at bedtime.  . Probiotic Product (ALIGN) 4 MG CAPS Take by mouth daily.  . rosuvastatin (CRESTOR) 10 MG tablet TAKE 1/2 TABLET ONCE DAILY FOR CHOLESTEROL  . SYNTHROID 125 MCG tablet TAKE 1 TABLET ONCE DAILY.  . [DISCONTINUED] methocarbamol (ROBAXIN) 500 MG tablet Take 1 tablet (500 mg total) by mouth 3 (three) times daily as needed for muscle spasms. DO NOT DRIVE after med, move slowly to avoid falls   No facility-administered encounter medications on file as of 08/15/2018.     Review of Systems:  Review of Systems  Constitutional: Positive for malaise/fatigue. Negative for chills and  fever.  HENT: Negative for congestion.   Eyes: Negative for blurred vision.  Respiratory: Negative for cough and shortness of breath.   Cardiovascular: Negative for chest pain, palpitations and leg swelling.  Gastrointestinal: Negative for abdominal pain, blood in stool, constipation, diarrhea, heartburn, melena, nausea and vomiting.       Diarrhea after meals has resolved; seemed to start with just with milk products then progressed to involve all food so was on bland diet  Genitourinary: Negative for dysuria.  Musculoskeletal: Positive for back pain, myalgias and neck pain. Negative for falls and joint pain.  Skin: Negative for itching and rash.  Neurological:  Positive for tingling and sensory change. Negative for dizziness and loss of consciousness.  Endo/Heme/Allergies: Bruises/bleeds easily.  Psychiatric/Behavioral: Negative for depression. The patient is nervous/anxious.        Reports memory loss, but not prominent    Health Maintenance  Topic Date Due  . PNA vac Low Risk Adult (2 of 2 - PCV13) 08/29/2014  . TETANUS/TDAP  06/19/2022  . INFLUENZA VACCINE  Completed  . DEXA SCAN  Completed    Physical Exam: Vitals:   08/15/18 1332  BP: 122/60  Pulse: 76  Temp: 98.4 F (36.9 C)  TempSrc: Oral  SpO2: 97%  Weight: 135 lb (61.2 kg)  Height: 5\' 3"  (1.6 m)   Body mass index is 23.91 kg/m. Physical Exam Constitutional:      Appearance: Normal appearance.  HENT:     Head: Normocephalic and atraumatic.  Cardiovascular:     Rate and Rhythm: Normal rate and regular rhythm.     Pulses: Normal pulses.     Heart sounds: Normal heart sounds.  Pulmonary:     Effort: Pulmonary effort is normal. No respiratory distress.     Breath sounds: Normal breath sounds. No wheezing.  Musculoskeletal: Normal range of motion.        General: No swelling, deformity or signs of injury.     Comments: No tenderness  Skin:    General: Skin is warm and dry.     Coloration: Skin is pale.    Neurological:     General: No focal deficit present.     Mental Status: She is alert and oriented to person, place, and time.  Psychiatric:        Mood and Affect: Mood normal.        Behavior: Behavior normal.        Thought Content: Thought content normal.        Judgment: Judgment normal.     Labs reviewed: Basic Metabolic Panel: Recent Labs    12/05/17 0700 03/27/18  NA 141 142  K 3.6 4.2  BUN 16 22*  CREATININE 0.6 0.5  TSH 1.02  --    Liver Function Tests: Recent Labs    12/05/17  AST 17  ALT 12  ALKPHOS 77   No results for input(s): LIPASE, AMYLASE in the last 8760 hours. No results for input(s): AMMONIA in the last 8760 hours. CBC: Recent Labs    12/05/17  WBC 10.2  HGB 12.7  HCT 39  PLT 345   Lipid Panel: Recent Labs    12/05/17  CHOL 134  HDL 56  LDLCALC 53  TRIG 126   Lab Results  Component Value Date   HGBA1C 6.3 03/27/2018    Procedures since last visit: Reviewed old MRI of lumbar spine preop for herniated disc  Assessment/Plan 1. Chronic midline low back pain with right-sided sciatica - x 2 mos - bothers her most with prolonged standing or walking and sits in lower back, also has sciatica vs radicular component in right leg (oddly does not involve thigh) - DG Lumbar Spine Complete; Future  2. Acute colitis -resolved, GI thought IBS, but seems to have resolved on its own after 9 days  -if recurs, will try her on cholestyramine  3. Cervical paraspinal muscle spasm -improved with therapy  4. Hyperglycemia -will check labs before she returns in 6 wks   5. Lactose intolerance in adult -loose bms with milk products were responding to lactaid, oddly she's not even having a problem here now  6. Senile osteoporosis -continues on prolia and vitamin D, is walking less with her back bothering her  Labs/tests ordered:   Orders Placed This Encounter  Procedures  . DG Lumbar Spine Complete    Standing Status:   Future    Standing  Expiration Date:   10/16/2019    Order Specific Question:   Reason for Exam (SYMPTOM  OR DIAGNOSIS REQUIRED)    Answer:   2 months of midline low back pain; intermittent numbness in right lower leg    Order Specific Question:   Preferred imaging location?    Answer:   [facility]    Order Specific Question:   Call Results- Best Contact Number?    Answer:   [phone]    Next appt:  09/26/2018 f/u on back pain  Axl Rodino L. Jandy Brackens, D.O. [facility] [address], [place] [address] Cell Phone (Mon-Fri 8am-5pm):  [phone] On Call:  [phone] & follow prompts after 5pm & weekends Office Phone:  [phone] Office Fax:  [phone]

## 2018-08-23 ENCOUNTER — Ambulatory Visit
Admission: RE | Admit: 2018-08-23 | Discharge: 2018-08-23 | Disposition: A | Discharge status: Home / Self Care | Payer: Medicare Other | Source: Ambulatory Visit | Attending: Internal Medicine | Admitting: Internal Medicine

## 2018-08-23 DIAGNOSIS — M5441 Lumbago with sciatica, right side: Principal | ICD-10-CM

## 2018-08-23 DIAGNOSIS — G8929 Other chronic pain: Secondary | ICD-10-CM

## 2018-08-23 DIAGNOSIS — M48061 Spinal stenosis, lumbar region without neurogenic claudication: Secondary | ICD-10-CM | POA: Diagnosis not present

## 2018-08-27 ENCOUNTER — Encounter: Payer: Self-pay | Admitting: *Deleted

## 2018-08-28 ENCOUNTER — Ambulatory Visit: Payer: Medicare Other | Admitting: *Deleted

## 2018-08-28 DIAGNOSIS — M6389 Disorders of muscle in diseases classified elsewhere, multiple sites: Secondary | ICD-10-CM | POA: Diagnosis not present

## 2018-08-28 DIAGNOSIS — M542 Cervicalgia: Secondary | ICD-10-CM | POA: Diagnosis not present

## 2018-08-28 DIAGNOSIS — M5033 Other cervical disc degeneration, cervicothoracic region: Secondary | ICD-10-CM | POA: Diagnosis not present

## 2018-08-28 DIAGNOSIS — M810 Age-related osteoporosis without current pathological fracture: Secondary | ICD-10-CM

## 2018-08-28 DIAGNOSIS — M47892 Other spondylosis, cervical region: Secondary | ICD-10-CM | POA: Diagnosis not present

## 2018-08-28 MED ORDER — DENOSUMAB 60 MG/ML ~~LOC~~ SOSY
60.0000 mg | PREFILLED_SYRINGE | Freq: Once | SUBCUTANEOUS | Status: AC
  Administered 2018-08-28: 60 mg via SUBCUTANEOUS

## 2018-09-14 ENCOUNTER — Other Ambulatory Visit: Payer: Self-pay | Admitting: Internal Medicine

## 2018-09-17 ENCOUNTER — Other Ambulatory Visit: Payer: Self-pay | Admitting: Internal Medicine

## 2018-09-18 ENCOUNTER — Other Ambulatory Visit: Payer: Self-pay | Admitting: Internal Medicine

## 2018-09-19 ENCOUNTER — Ambulatory Visit (INDEPENDENT_AMBULATORY_CARE_PROVIDER_SITE_OTHER): Payer: Medicare Other

## 2018-09-19 DIAGNOSIS — I441 Atrioventricular block, second degree: Secondary | ICD-10-CM

## 2018-09-20 NOTE — Progress Notes (Signed)
Remote pacemaker transmission.   

## 2018-09-21 ENCOUNTER — Encounter: Payer: Self-pay | Admitting: Cardiology

## 2018-09-23 LAB — CUP PACEART REMOTE DEVICE CHECK
Battery Remaining Longevity: 88 mo
Battery Voltage: 3.02 V
Brady Statistic AP VP Percent: 4.43 %
Brady Statistic AP VS Percent: 6.68 %
Brady Statistic AS VP Percent: 44.62 %
Brady Statistic AS VS Percent: 44.27 %
Brady Statistic RV Percent Paced: 48.73 %
Date Time Interrogation Session: 20200123152457
Implantable Lead Implant Date: 20171219
Implantable Lead Location: 753859
Implantable Lead Location: 753860
Implantable Lead Model: 5076
Implantable Pulse Generator Implant Date: 20171219
Lead Channel Impedance Value: 304 Ohm
Lead Channel Impedance Value: 304 Ohm
Lead Channel Impedance Value: 342 Ohm
Lead Channel Impedance Value: 380 Ohm
Lead Channel Pacing Threshold Amplitude: 0.625 V
Lead Channel Pacing Threshold Amplitude: 1.375 V
Lead Channel Pacing Threshold Pulse Width: 0.4 ms
Lead Channel Sensing Intrinsic Amplitude: 3.25 mV
Lead Channel Sensing Intrinsic Amplitude: 3.25 mV
Lead Channel Sensing Intrinsic Amplitude: 4.75 mV
Lead Channel Sensing Intrinsic Amplitude: 4.75 mV
Lead Channel Setting Pacing Amplitude: 2 V
Lead Channel Setting Pacing Amplitude: 2.75 V
Lead Channel Setting Pacing Pulse Width: 0.4 ms
Lead Channel Setting Sensing Sensitivity: 2.8 mV
MDC IDC LEAD IMPLANT DT: 20171219
MDC IDC MSMT LEADCHNL RV PACING THRESHOLD PULSEWIDTH: 0.4 ms
MDC IDC STAT BRADY RA PERCENT PACED: 11.01 %

## 2018-09-26 ENCOUNTER — Non-Acute Institutional Stay: Payer: Medicare Other | Admitting: Internal Medicine

## 2018-09-26 ENCOUNTER — Encounter: Payer: Self-pay | Admitting: Internal Medicine

## 2018-09-26 VITALS — BP 128/68 | HR 83 | Temp 98.7°F | Ht 63.0 in | Wt 138.0 lb

## 2018-09-26 DIAGNOSIS — R143 Flatulence: Secondary | ICD-10-CM

## 2018-09-26 DIAGNOSIS — K582 Mixed irritable bowel syndrome: Secondary | ICD-10-CM

## 2018-09-26 DIAGNOSIS — R600 Localized edema: Secondary | ICD-10-CM

## 2018-09-26 NOTE — Progress Notes (Signed)
Location:  Occupational psychologist of Service:  Clinic (12)  Provider: Dmiya Malphrus L. Mariea Clonts, D.O., C.M.D.  Goals of Care:  Advanced Directives 05/08/2018  Does Patient Have a Medical Advance Directive? Yes  Type of Advance Directive [place]  Does patient want to make changes to medical advance directive? No - Patient declined  Copy of [place] in Chart? Yes     Chief Complaint  Patient presents with  . Medical Management of Chronic Issues    6 week follow-up    HPI: Patient is a 85 y.o. female seen today for medical management of chronic diseases.    Does not feel well.  She's been having gut problems.  Has been to [facility] 3x.  She is taking IBgard.  Through the years, she's had bowel trouble.  She was having a lot of gas and isolating herself.  Having stomach cramps.  This has lessened.  They're otc peppermint capsules.  Had 9 days of diarrhea and then hemorrhoids.  Took a bunch of imodium.  Had to follow the BRAT diet.  It all began in early November.  No changes to her diet at that time.  Also has lactose intolerance and takes her lactaid.  Has been on align since it came on the market also. Creon did not help her before.  Gas pains are lower abdomen not in stomach.  Avoids foods that would cause heartburn.  She does see Dr. Cristina Gong next week.    Ankles are swelling. No shortness of breath, but less active lately due to isolating herself due to her gas.   Past Medical History:  Diagnosis Date  . Allergy   . Arthritis   . 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 Cataract   . Chronic anxiety   . Constipation   . Diverticulosis of sigmoid colon   . Emphysema of lung ([place])   . GERD (gastroesophageal reflux disease)   . Glaucoma   . H/O adenomatous polyp of colon    Dr. Cristina Gong  . High cholesterol   . Hypertension   . Hypothyroidism   . Insomnia   . Lactose  intolerance   . Lumbar degenerative disc disease   . Onychomycosis   . Osteoarthritis   . Osteoporosis   . Second degree Mobitz II AV block    s/p PPM  . Spinal stenosis   . Thyroid disease   . Varicose veins     Past Surgical History:  Procedure Laterality Date  . ABDOMINAL HYSTERECTOMY    . BLADDER REPAIR    . blephoplasty bilateral  03/10/2014  . CATARACT EXTRACTION W/ INTRAOCULAR LENS  IMPLANT, BILATERAL  7 & 8 2014  . CHOLECYSTECTOMY    . COLONOSCOPY  05/18/2012  . EP IMPLANTABLE DEVICE N/A 08/16/2016   MDT Advisa MRI conditional PPM implanted by Dr Rayann Heman for mobitz II second degree AV block  . EYE SURGERY Right 05/02/2018   laser eye surgery  . herniated disc repair    . INCONTINENCE SURGERY    . JOINT REPLACEMENT    . LAMINECTOMY  02/2011   L3-L5  Dr. Ellene Route  . LUMBAR LAMINECTOMY/DECOMPRESSION MICRODISCECTOMY Right 09/21/2015   Procedure: Right Lumbar Three-FourMicrodiskectomy;  Surgeon: Kristeen Miss, MD;  Location: [facility];  Service: Neurosurgery;  Laterality: Right;  Right L3-4 Microdiskectomy  . SHOULDER ARTHROSCOPY WITH ROTATOR CUFF REPAIR Right 08/2003  . TONSILLECTOMY    .  TOTAL HIP ARTHROPLASTY     right 1999    Allergies  Allergen Reactions  . Codeine Nausea Only  . Macrobid [Nitrofurantoin] Diarrhea  . Miralax [Polyethylene Glycol] Other (See Comments)    cramping  . Pain Relief [Acetaminophen] Nausea And Vomiting  . Paxil [Paroxetine Hcl] Nausea Only  . Percodan [Oxycodone-Aspirin] Itching  . Oxycodone Nausea Only  . Oxycodone-Acetaminophen Nausea Only  . Penicillins Rash    Has patient had a PCN reaction causing immediate rash, facial/tongue/throat swelling, SOB or lightheadedness with hypotension: YES Has patient had a PCN reaction causing severe rash involving mucus membranes or skin necrosis: YES Has patient had a PCN reaction that required hospitalization NO Has patient had a PCN reaction occurring within the last 10 years: NO If all of the  above answers are "NO", then may proceed with Cephalosporin use.    Outpatient Encounter Medications as of 09/26/2018  Medication Sig  . Calcium-Phosphorus-Vitamin D (CITRACAL +D3) 250-107-500 MG-MG-UNIT CHEW Chew 2 tablets by mouth daily.   . Cyanocobalamin (VITAMIN B 12 PO) Take 1 tablet by mouth daily.  Marland Kitchen denosumab (PROLIA) 60 MG/ML SOSY injection Inject 60 mg into the skin every 6 (six) months.  . diltiazem (CARDIZEM CD) 240 MG 24 hr capsule Take 1 capsule (240 mg total) by mouth daily.  . dorzolamide-timolol (COSOPT) 22.3-6.8 MG/ML ophthalmic solution Place 1 drop into both eyes 2 (two) times daily.  Marland Kitchen ipratropium (ATROVENT) 0.06 % nasal spray USE 2 SPRAYS IN EACH NOSTRIL 3 TIMES A DAY AS NEEDED.  Marland Kitchen irbesartan-hydrochlorothiazide (AVALIDE) 300-12.5 MG tablet TAKE 1 TABLET EACH DAY.  Marland Kitchen LACTASE PO Take 1 tablet by mouth as needed (when eating dairy products). Take as directed  . LUMIGAN 0.01 % SOLN   . Multiple Vitamins-Minerals (MULTIVITAMIN WITH MINERALS) tablet Take 1 tablet by mouth daily.  Mckinley Jewel Dimesylate (RHOPRESSA) 0.02 % SOLN Apply 1 drop to eye at bedtime.  . Probiotic Product (ALIGN) 4 MG CAPS Take by mouth daily.  . rosuvastatin (CRESTOR) 10 MG tablet TAKE 1/2 TABLET ONCE DAILY FOR CHOLESTEROL  . SYNTHROID 125 MCG tablet TAKE 1 TABLET ONCE DAILY.   No facility-administered encounter medications on file as of 09/26/2018.     Review of Systems:  Review of Systems  Constitutional: Positive for malaise/fatigue. Negative for chills, fever and weight loss.  HENT: Negative for congestion.   Eyes: Negative for blurred vision.  Respiratory: Negative for cough and shortness of breath.   Cardiovascular: Positive for leg swelling. Negative for chest pain, palpitations, orthopnea, claudication and PND.  Gastrointestinal: Positive for abdominal pain, constipation and diarrhea. Negative for blood in stool, heartburn, melena, nausea and vomiting.  Genitourinary: Negative for  dysuria.  Musculoskeletal: Positive for back pain. Negative for falls and myalgias.  Skin: Negative for itching and rash.  Neurological: Negative for dizziness and loss of consciousness.  Psychiatric/Behavioral: Negative for depression and memory loss. The patient is nervous/anxious. The patient does not have insomnia.     Health Maintenance  Topic Date Due  . PNA vac Low Risk Adult (2 of 2 - PCV13) 08/29/2014  . TETANUS/TDAP  06/19/2022  . INFLUENZA VACCINE  Completed  . DEXA SCAN  Completed    Physical Exam: Vitals:   09/26/18 1133  BP: 128/68  Pulse: 83  Temp: 98.7 F (37.1 C)  TempSrc: Oral  SpO2: 96%  Weight: 138 lb (62.6 kg)  Height: 5\' 3"  (1.6 m)   Body mass index is 24.45 kg/m. Physical Exam Vitals signs reviewed.  Constitutional:      Appearance: Normal appearance.  HENT:     Head: Normocephalic and atraumatic.  Neck:     Comments: No jvd Cardiovascular:     Rate and Rhythm: Normal rate and regular rhythm.     Pulses: Normal pulses.     Heart sounds: Normal heart sounds.  Pulmonary:     Effort: Pulmonary effort is normal.     Breath sounds: Normal breath sounds. No rales.  Abdominal:     General: Bowel sounds are normal. There is distension.     Palpations: Abdomen is soft. There is no mass.     Tenderness: There is no abdominal tenderness. There is no guarding or rebound.  Musculoskeletal:     Right lower leg: Edema present.     Left lower leg: Edema present.     Comments: 1+ pitting edema which is new b/l  Skin:    General: Skin is warm and dry.     Capillary Refill: Capillary refill takes less than 2 seconds.  Neurological:     General: No focal deficit present.     Mental Status: She is alert and oriented to person, place, and time.  Psychiatric:        Mood and Affect: Mood normal.    Labs reviewed: Basic Metabolic Panel: Recent Labs    12/05/17 0700 03/27/18  NA 141 142  K 3.6 4.2  BUN 16 22*  CREATININE 0.6 0.5  TSH 1.02  --     Liver Function Tests: Recent Labs    12/05/17  AST 17  ALT 12  ALKPHOS 77   No results for input(s): LIPASE, AMYLASE in the last 8760 hours. No results for input(s): AMMONIA in the last 8760 hours. CBC: Recent Labs    12/05/17  WBC 10.2  HGB 12.7  HCT 39  PLT 345   Lipid Panel: Recent Labs    12/05/17  CHOL 134  HDL 56  LDLCALC 53  TRIG 126   Lab Results  Component Value Date   HGBA1C 6.3 03/27/2018    Procedures since last visit: No results found.  Assessment/Plan 1. Excessive flatus -? Due to IBS b/c it has not fully resolved with the IBguard and remains more bothersome than normal -? H pylori infection--reports terrible odor to flatus and loud and embarassing  2. Irritable bowel syndrome with both constipation and diarrhea -has had similar historically -is not eating milk products unless takes lactase tablets -cont IBguard pending GI appt  3. Bilateral lower extremity edema -new -no change to eating habits in terms of sodium content or packaged foods--eats in dining room as usual here -will elevate feet in recliner with pillow underneath for a week -if not better, certainly if worse or if she notices shortness of breath with walking on ground or in the water, call me back so we can check an echo -wt up three lbs, but looking back, it's bee up to 139 before  Labs/tests ordered:  No new Next appt:  10/31/2018 f/u about edema which concerns me   Tayna Smethurst L. Tanisa Lagace, D.O. [facility] [address], [place] [address] Cell Phone (Mon-Fri 8am-5pm):  [phone] On Call:  [phone] & follow prompts after 5pm & weekends Office Phone:  [phone] Office Fax:  [phone]

## 2018-09-26 NOTE — Patient Instructions (Signed)
If your swelling does not improve after elevating your feet with a pillow on your recliner in one week OR you notice it's worse OR you become short of breath, call me back so we can order an ultrasound of your heart.  Try to do your walking.  Continue the IBguard pending your appointment with Dr. Cristina Gong.  I wonder about H. Pylori causing your terrible gas problem.

## 2018-10-03 DIAGNOSIS — R140 Abdominal distension (gaseous): Secondary | ICD-10-CM | POA: Diagnosis not present

## 2018-10-03 DIAGNOSIS — R143 Flatulence: Secondary | ICD-10-CM | POA: Diagnosis not present

## 2018-10-04 DIAGNOSIS — H401122 Primary open-angle glaucoma, left eye, moderate stage: Secondary | ICD-10-CM | POA: Diagnosis not present

## 2018-10-04 DIAGNOSIS — H401113 Primary open-angle glaucoma, right eye, severe stage: Secondary | ICD-10-CM | POA: Diagnosis not present

## 2018-10-04 DIAGNOSIS — Z961 Presence of intraocular lens: Secondary | ICD-10-CM | POA: Diagnosis not present

## 2018-10-10 ENCOUNTER — Ambulatory Visit (INDEPENDENT_AMBULATORY_CARE_PROVIDER_SITE_OTHER): Payer: Medicare Other

## 2018-10-10 ENCOUNTER — Telehealth: Payer: Self-pay

## 2018-10-10 DIAGNOSIS — I441 Atrioventricular block, second degree: Secondary | ICD-10-CM

## 2018-10-10 DIAGNOSIS — R001 Bradycardia, unspecified: Secondary | ICD-10-CM

## 2018-10-10 NOTE — Telephone Encounter (Signed)
I called the pt to let her know we did receive her transmission and gave her the next remote date.

## 2018-10-11 LAB — CUP PACEART REMOTE DEVICE CHECK
Battery Remaining Longevity: 103 mo
Battery Voltage: 3.02 V
Brady Statistic AP VP Percent: 0.04 %
Brady Statistic AP VS Percent: 8.06 %
Brady Statistic AS VP Percent: 1.17 %
Brady Statistic AS VS Percent: 90.73 %
Brady Statistic RA Percent Paced: 8.01 %
Date Time Interrogation Session: 20200212213211
Implantable Lead Implant Date: 20171219
Implantable Lead Implant Date: 20171219
Implantable Lead Location: 753859
Implantable Lead Location: 753860
Implantable Lead Model: 5076
Implantable Lead Model: 5076
Implantable Pulse Generator Implant Date: 20171219
Lead Channel Impedance Value: 304 Ohm
Lead Channel Impedance Value: 323 Ohm
Lead Channel Impedance Value: 361 Ohm
Lead Channel Impedance Value: 380 Ohm
Lead Channel Pacing Threshold Amplitude: 0.625 V
Lead Channel Pacing Threshold Amplitude: 1.375 V
Lead Channel Pacing Threshold Pulse Width: 0.4 ms
Lead Channel Pacing Threshold Pulse Width: 0.4 ms
Lead Channel Sensing Intrinsic Amplitude: 3.125 mV
Lead Channel Sensing Intrinsic Amplitude: 3.125 mV
Lead Channel Sensing Intrinsic Amplitude: 5.25 mV
Lead Channel Sensing Intrinsic Amplitude: 5.25 mV
Lead Channel Setting Pacing Amplitude: 2 V
Lead Channel Setting Pacing Amplitude: 2.75 V
Lead Channel Setting Sensing Sensitivity: 2.8 mV
MDC IDC SET LEADCHNL RV PACING PULSEWIDTH: 0.4 ms
MDC IDC STAT BRADY RV PERCENT PACED: 1.23 %

## 2018-10-23 ENCOUNTER — Encounter: Payer: Self-pay | Admitting: Internal Medicine

## 2018-10-23 DIAGNOSIS — R319 Hematuria, unspecified: Secondary | ICD-10-CM | POA: Diagnosis not present

## 2018-10-23 DIAGNOSIS — R350 Frequency of micturition: Secondary | ICD-10-CM | POA: Diagnosis not present

## 2018-10-23 DIAGNOSIS — N390 Urinary tract infection, site not specified: Secondary | ICD-10-CM | POA: Diagnosis not present

## 2018-10-23 NOTE — Progress Notes (Signed)
Remote pacemaker transmission.   

## 2018-10-31 ENCOUNTER — Encounter: Payer: Self-pay | Admitting: Internal Medicine

## 2018-10-31 ENCOUNTER — Non-Acute Institutional Stay: Payer: Medicare Other | Admitting: Internal Medicine

## 2018-10-31 VITALS — BP 118/60 | HR 81 | Temp 99.1°F | Ht 63.0 in | Wt 139.0 lb

## 2018-10-31 DIAGNOSIS — I700 Atherosclerosis of aorta: Secondary | ICD-10-CM

## 2018-10-31 DIAGNOSIS — M5441 Lumbago with sciatica, right side: Secondary | ICD-10-CM | POA: Diagnosis not present

## 2018-10-31 DIAGNOSIS — K582 Mixed irritable bowel syndrome: Secondary | ICD-10-CM

## 2018-10-31 DIAGNOSIS — G8929 Other chronic pain: Secondary | ICD-10-CM | POA: Diagnosis not present

## 2018-10-31 DIAGNOSIS — R600 Localized edema: Secondary | ICD-10-CM

## 2018-10-31 NOTE — Progress Notes (Addendum)
Location:  Occupational psychologist of Service:  Clinic (12)  Provider: Syrenity Klepacki L. Mariea Clonts, D.O., C.M.D.  Goals of Care:  Advanced Directives 05/08/2018  Does Patient Have a Medical Advance Directive? Yes  Type of Advance Directive [place]  Does patient want to make changes to medical advance directive? No - Patient declined  Copy of [place] in Chart? Yes   Chief Complaint  Patient presents with  . Medical Management of Chronic Issues    4 week follow-up    HPI: Patient is a 85 y.o. female seen today for medical management of chronic diseases.    Says she still has her ailments.    Saw Dr. Cristina Gong:  A rice diet was recommended temporarily.  She has put this off.  This was to check for bacterial overgrowth syndrome.  She reports she is still gassy.  She had peanut butter, french bread, cherries and is taking beano before her first bite.    She feels a little better and stands up straighter when her back is bothering her if she wears her "[employer]" brace.  Says she is too old to have anything done about it.    She was noted to have some atherosclerosis of her aorta.  We reviewed what this means since it will be added to her problem list.  She is now 85 yo.  She's been walking and pool walking.  Her edema is a bit worse than last time.  She only strolled through [employer] today.  She is a little bit short of breath sometimes, but not severely.  She has no tightness in her chest or coughing.  No nausea.   Past Medical History:  Diagnosis Date  . Allergy   . Arthritis   . 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 Cataract   . Chronic anxiety   . Constipation   . Diverticulosis of sigmoid colon   . Emphysema of lung ([place] )   . GERD (gastroesophageal reflux disease)   . Glaucoma   . H/O adenomatous polyp of colon    Dr. Cristina Gong  . High cholesterol   . Hypertension   .  Hypothyroidism   . Insomnia   . Lactose intolerance   . Lumbar degenerative disc disease   . Onychomycosis   . Osteoarthritis   . Osteoporosis   . Second degree Mobitz II AV block    s/p PPM  . Spinal stenosis   . Thyroid disease   . Varicose veins     Past Surgical History:  Procedure Laterality Date  . ABDOMINAL HYSTERECTOMY    . BLADDER REPAIR    . blephoplasty bilateral  03/10/2014  . CATARACT EXTRACTION W/ INTRAOCULAR LENS  IMPLANT, BILATERAL  7 & 8 2014  . CHOLECYSTECTOMY    . COLONOSCOPY  05/18/2012  . EP IMPLANTABLE DEVICE N/A 08/16/2016   MDT Advisa MRI conditional PPM implanted by Dr Rayann Heman for mobitz II second degree AV block  . EYE SURGERY Right 05/02/2018   laser eye surgery  . herniated disc repair    . INCONTINENCE SURGERY    . JOINT REPLACEMENT    . LAMINECTOMY  02/2011   L3-L5  Dr. Ellene Route  . LUMBAR LAMINECTOMY/DECOMPRESSION MICRODISCECTOMY Right 09/21/2015   Procedure: Right Lumbar Three-FourMicrodiskectomy;  Surgeon: Kristeen Miss, MD;  Location: [facility];  Service: Neurosurgery;  Laterality: Right;  Right L3-4 Microdiskectomy  . SHOULDER  ARTHROSCOPY WITH ROTATOR CUFF REPAIR Right 08/2003  . TONSILLECTOMY    . TOTAL HIP ARTHROPLASTY     right 1999    Allergies  Allergen Reactions  . Codeine Nausea Only  . Macrobid [Nitrofurantoin] Diarrhea  . Miralax [Polyethylene Glycol] Other (See Comments)    cramping  . Pain Relief [Acetaminophen] Nausea And Vomiting  . Paxil [Paroxetine Hcl] Nausea Only  . Percodan [Oxycodone-Aspirin] Itching  . Oxycodone Nausea Only  . Oxycodone-Acetaminophen Nausea Only  . Penicillins Rash    Has patient had a PCN reaction causing immediate rash, facial/tongue/throat swelling, SOB or lightheadedness with hypotension: YES Has patient had a PCN reaction causing severe rash involving mucus membranes or skin necrosis: YES Has patient had a PCN reaction that required hospitalization NO Has patient had a PCN reaction occurring  within the last 10 years: NO If all of the above answers are "NO", then may proceed with Cephalosporin use.    Outpatient Encounter Medications as of 10/31/2018  Medication Sig  . Calcium-Phosphorus-Vitamin D (CITRACAL +D3) 250-107-500 MG-MG-UNIT CHEW Chew 2 tablets by mouth daily.   . Cyanocobalamin (VITAMIN B 12 PO) Take 1 tablet by mouth daily.  Marland Kitchen denosumab (PROLIA) 60 MG/ML SOSY injection Inject 60 mg into the skin every 6 (six) months.  . diltiazem (CARDIZEM CD) 240 MG 24 hr capsule Take 1 capsule (240 mg total) by mouth daily.  . dorzolamide-timolol (COSOPT) 22.3-6.8 MG/ML ophthalmic solution Place 1 drop into both eyes 2 (two) times daily.  Marland Kitchen ipratropium (ATROVENT) 0.06 % nasal spray USE 2 SPRAYS IN EACH NOSTRIL 3 TIMES A DAY AS NEEDED.  Marland Kitchen irbesartan-hydrochlorothiazide (AVALIDE) 300-12.5 MG tablet TAKE 1 TABLET EACH DAY.  Marland Kitchen LACTASE PO Take 1 tablet by mouth as needed (when eating dairy products). Take as directed  . LUMIGAN 0.01 % SOLN Place 1 drop into both eyes 2 (two) times daily.   . Multiple Vitamins-Minerals (MULTIVITAMIN WITH MINERALS) tablet Take 1 tablet by mouth daily.  Mckinley Jewel Dimesylate (RHOPRESSA) 0.02 % SOLN Apply 1 drop to eye at bedtime.  . Probiotic Product (ALIGN) 4 MG CAPS Take by mouth daily.  . rosuvastatin (CRESTOR) 10 MG tablet TAKE 1/2 TABLET ONCE DAILY FOR CHOLESTEROL  . SYNTHROID 125 MCG tablet TAKE 1 TABLET ONCE DAILY.   No facility-administered encounter medications on file as of 10/31/2018.     Review of Systems:  Review of Systems  Constitutional: Positive for malaise/fatigue. Negative for chills and fever.  HENT: Negative for congestion.   Respiratory: Negative for cough and shortness of breath.   Cardiovascular: Negative for chest pain, palpitations and leg swelling.  Gastrointestinal: Positive for constipation and diarrhea. Negative for abdominal pain, blood in stool, melena, nausea and vomiting.       Flatus persists  Genitourinary: Positive  for frequency and urgency. Negative for dysuria.  Musculoskeletal: Positive for back pain. Negative for falls.  Skin: Negative for itching and rash.  Neurological: Negative for dizziness and loss of consciousness.  Psychiatric/Behavioral: Negative for depression and memory loss. The patient is not nervous/anxious and does not have insomnia.     Health Maintenance  Topic Date Due  . PNA vac Low Risk Adult (2 of 2 - PCV13) 08/29/2014  . TETANUS/TDAP  06/19/2022  . INFLUENZA VACCINE  Completed  . DEXA SCAN  Completed    Physical Exam: Vitals:   10/31/18 1400  BP: 118/60  Pulse: 81  Temp: 99.1 F (37.3 C)  TempSrc: Oral  SpO2: 96%  Weight: 139 lb (63  kg)  Height: 5\' 3"  (1.6 m)   Body mass index is 24.62 kg/m. Physical Exam Vitals signs reviewed.  Constitutional:      General: She is not in acute distress.    Appearance: Normal appearance. She is not toxic-appearing.  HENT:     Head: Normocephalic and atraumatic.  Cardiovascular:     Rate and Rhythm: Normal rate and regular rhythm.     Pulses: Normal pulses.     Heart sounds: Normal heart sounds.  Pulmonary:     Effort: Pulmonary effort is normal.     Breath sounds: Normal breath sounds. No rales.  Abdominal:     General: Bowel sounds are normal. There is distension.     Palpations: Abdomen is soft. There is no mass.     Tenderness: There is no abdominal tenderness. There is no right CVA tenderness, left CVA tenderness, guarding or rebound.  Musculoskeletal: Normal range of motion.     Comments: Wearing her "[employer] brace" as she calls it (velcro elastic just around the waist only)  Skin:    General: Skin is warm and dry.     Capillary Refill: Capillary refill takes less than 2 seconds.     Coloration: Skin is pale (but baseline).  Neurological:     General: No focal deficit present.     Mental Status: She is alert and oriented to person, place, and time.  Psychiatric:        Mood and Affect: Mood normal.      Labs reviewed: Basic Metabolic Panel: Recent Labs    12/05/17 0700 03/27/18  NA 141 142  K 3.6 4.2  BUN 16 22*  CREATININE 0.6 0.5  TSH 1.02  --    Liver Function Tests: Recent Labs    12/05/17  AST 17  ALT 12  ALKPHOS 77   No results for input(s): LIPASE, AMYLASE in the last 8760 hours. No results for input(s): AMMONIA in the last 8760 hours. CBC: Recent Labs    12/05/17  WBC 10.2  HGB 12.7  HCT 39  PLT 345   Lipid Panel: Recent Labs    12/05/17  CHOL 134  HDL 56  LDLCALC 53  TRIG 126   Lab Results  Component Value Date   HGBA1C 6.3 03/27/2018    Procedures since last visit: No results found.  Assessment/Plan 1. Bilateral lower extremity edema - worse than last time and she's been more active -counseled on low sodium diet (limited at present with her restrictions b/c of her flatus issue being addressed by GI) -having some mild dyspnea also - due to this progression to where she now has some pitting, we will recheck her echo to ensure she has not had a drop in her EF, progression of her diastolic dysfunction, a new valvular concern or pulmonary htn developing  - ECHOCARDIOGRAM COMPLETE; Future  2. Irritable bowel syndrome with both constipation and diarrhea -with severe flatus -not responding to gas-x or beano -is to try bland diet per gi, but has not yet done this  3. Chronic midline low back pain with right-sided sciatica -using brace for support, does not want to pursue surgery again at her age and not so sure she needs that anyway as pain is intermittent not persistent, only using tylenol and brace (does not tolerate opioids well with several allergies and nsaids are not recommended chronically with her other meds and risks of gi bleed and renal failure) -cont water aerobics and walking  4.  Aortic  atherosclerosis:  Was noted on CT abdomen done by GI -reviewed this with her--of course, healthy diet low in fat and bp control recommended  Next  appt:  11/05/2018 prolia; 4 mos med mgt   Kamon Fahr L. Ayson Cherubini, D.O. [facility] [address], [place] [address] Cell Phone (Mon-Fri 8am-5pm):  [phone] On Call:  [phone] & follow prompts after 5pm & weekends Office Phone:  [phone] Office Fax:  [phone]

## 2018-11-05 ENCOUNTER — Other Ambulatory Visit: Payer: Self-pay | Admitting: Internal Medicine

## 2018-11-07 DIAGNOSIS — I700 Atherosclerosis of aorta: Secondary | ICD-10-CM | POA: Insufficient documentation

## 2018-11-12 ENCOUNTER — Ambulatory Visit (HOSPITAL_COMMUNITY)
Admission: RE | Admit: 2018-11-12 | Discharge: 2018-11-12 | Disposition: A | Discharge status: Home / Self Care | Payer: Medicare Other | Source: Ambulatory Visit | Attending: Internal Medicine | Admitting: Internal Medicine

## 2018-11-12 ENCOUNTER — Other Ambulatory Visit: Payer: Self-pay

## 2018-11-12 DIAGNOSIS — I081 Rheumatic disorders of both mitral and tricuspid valves: Secondary | ICD-10-CM | POA: Diagnosis not present

## 2018-11-12 DIAGNOSIS — R600 Localized edema: Secondary | ICD-10-CM

## 2018-11-12 DIAGNOSIS — I110 Hypertensive heart disease with heart failure: Secondary | ICD-10-CM | POA: Insufficient documentation

## 2018-11-12 DIAGNOSIS — I509 Heart failure, unspecified: Secondary | ICD-10-CM | POA: Insufficient documentation

## 2018-11-12 NOTE — Progress Notes (Signed)
  Echocardiogram 2D Echocardiogram has been performed.  Jennette Dubin 11/12/2018, 12:07 PM

## 2018-11-14 ENCOUNTER — Telehealth: Payer: Self-pay | Admitting: *Deleted

## 2018-11-14 MED ORDER — FUROSEMIDE 20 MG PO TABS: 20.0000 mg | ORAL_TABLET | Freq: Every morning | ORAL | 1 refills | Status: DC

## 2018-11-14 MED ORDER — IRBESARTAN 300 MG PO TABS: 300.0000 mg | ORAL_TABLET | Freq: Every day | ORAL | 1 refills | Status: DC

## 2018-11-14 NOTE — Telephone Encounter (Signed)
Spoke with patient and advised results Med list updated rx sent to pharmacy by e-script Copy left at front desk at [facility]

## 2018-11-14 NOTE — Telephone Encounter (Signed)
-----   Message from Gayland Curry, DO sent at 11/12/2018  2:50 PM EDT ----- No significant change in her echocardiogram.  For some reason, the relaxation of her heart could not be well evaluated on the test for some reason.   Let's stop the hctz part of her irbesartan and just continue the irbesartan portion.  Begin lasix 20mg  po daily instead.  She should also be sure to eat a banana daily for the potassium.  We will check a bmp at [facility] next tues or thurs at 8am.  Hopefully, this will improve her swelling of her legs.

## 2018-11-22 ENCOUNTER — Ambulatory Visit: Payer: Self-pay | Admitting: Internal Medicine

## 2018-11-22 DIAGNOSIS — D649 Anemia, unspecified: Secondary | ICD-10-CM | POA: Diagnosis not present

## 2018-11-22 DIAGNOSIS — R112 Nausea with vomiting, unspecified: Secondary | ICD-10-CM | POA: Diagnosis not present

## 2018-11-22 LAB — BASIC METABOLIC PANEL
BUN: 15 (ref 4–21)
Creatinine: 0.7 (ref 0.5–1.1)
Glucose: 123
Potassium: 4.2 (ref 3.4–5.3)
Sodium: 145 (ref 137–147)

## 2018-11-23 ENCOUNTER — Encounter: Payer: Self-pay | Admitting: Internal Medicine

## 2018-12-17 ENCOUNTER — Other Ambulatory Visit: Payer: Self-pay | Admitting: Internal Medicine

## 2018-12-23 ENCOUNTER — Encounter: Payer: Self-pay | Admitting: Internal Medicine

## 2019-01-09 ENCOUNTER — Other Ambulatory Visit: Payer: Self-pay

## 2019-01-09 ENCOUNTER — Ambulatory Visit (INDEPENDENT_AMBULATORY_CARE_PROVIDER_SITE_OTHER): Payer: Medicare Other | Admitting: *Deleted

## 2019-01-09 DIAGNOSIS — I441 Atrioventricular block, second degree: Secondary | ICD-10-CM | POA: Diagnosis not present

## 2019-01-09 DIAGNOSIS — R001 Bradycardia, unspecified: Secondary | ICD-10-CM

## 2019-01-09 LAB — CUP PACEART REMOTE DEVICE CHECK
Battery Remaining Longevity: 89 mo
Battery Voltage: 3.02 V
Brady Statistic AP VP Percent: 3.96 %
Brady Statistic AP VS Percent: 9.13 %
Brady Statistic AS VP Percent: 28.89 %
Brady Statistic AS VS Percent: 58.01 %
Brady Statistic RA Percent Paced: 12.91 %
Brady Statistic RV Percent Paced: 32.69 %
Date Time Interrogation Session: 20200513134637
Implantable Lead Implant Date: 20171219
Implantable Lead Implant Date: 20171219
Implantable Lead Location: 753859
Implantable Lead Location: 753860
Implantable Lead Model: 5076
Implantable Lead Model: 5076
Implantable Pulse Generator Implant Date: 20171219
Lead Channel Impedance Value: 304 Ohm
Lead Channel Impedance Value: 304 Ohm
Lead Channel Impedance Value: 342 Ohm
Lead Channel Impedance Value: 380 Ohm
Lead Channel Pacing Threshold Amplitude: 0.75 V
Lead Channel Pacing Threshold Amplitude: 1.375 V
Lead Channel Pacing Threshold Pulse Width: 0.4 ms
Lead Channel Pacing Threshold Pulse Width: 0.4 ms
Lead Channel Sensing Intrinsic Amplitude: 3.5 mV
Lead Channel Sensing Intrinsic Amplitude: 3.5 mV
Lead Channel Sensing Intrinsic Amplitude: 6.125 mV
Lead Channel Sensing Intrinsic Amplitude: 6.125 mV
Lead Channel Setting Pacing Amplitude: 2 V
Lead Channel Setting Pacing Amplitude: 3 V
Lead Channel Setting Pacing Pulse Width: 0.4 ms
Lead Channel Setting Sensing Sensitivity: 2.8 mV

## 2019-01-15 ENCOUNTER — Other Ambulatory Visit: Payer: Self-pay | Admitting: Internal Medicine

## 2019-01-28 ENCOUNTER — Other Ambulatory Visit: Payer: Self-pay | Admitting: Internal Medicine

## 2019-01-29 DIAGNOSIS — R143 Flatulence: Secondary | ICD-10-CM | POA: Diagnosis not present

## 2019-01-29 NOTE — Progress Notes (Signed)
Remote pacemaker transmission.   

## 2019-02-04 ENCOUNTER — Other Ambulatory Visit: Payer: Self-pay | Admitting: Internal Medicine

## 2019-02-27 ENCOUNTER — Telehealth (INDEPENDENT_AMBULATORY_CARE_PROVIDER_SITE_OTHER): Payer: Medicare Other | Admitting: Internal Medicine

## 2019-02-27 ENCOUNTER — Telehealth: Payer: Self-pay

## 2019-02-27 ENCOUNTER — Other Ambulatory Visit: Payer: Self-pay

## 2019-02-27 DIAGNOSIS — I10 Essential (primary) hypertension: Secondary | ICD-10-CM | POA: Diagnosis not present

## 2019-02-27 DIAGNOSIS — I441 Atrioventricular block, second degree: Secondary | ICD-10-CM

## 2019-02-27 NOTE — Progress Notes (Signed)
Electrophysiology TeleHealth Note  Due to national recommendations of social distancing due to [place] 19, an audio telehealth visit is felt to be most appropriate for this patient at this time.  Verbal consent was obtained by me for the telehealth visit today.  The patient does not have capability for a virtual visit.  A phone visit is therefore required today.   Date:  02/27/2019   ID:  Destiny [name], DOB 23-Nov-1927, MRN [id number]  Location: patient's home  Provider location:  [facility] [address]  Evaluation Performed: Follow-up visit  PCP:  Gayland Curry, DO   Electrophysiologist:  Dr Rayann Heman  Chief Complaint:  palpitations  History of Present Illness:    Destiny [name] is a 85 y.o. female who presents via telehealth conferencing today.  Since last being seen in our clinic, the patient reports doing very well.  She did have swelling in March for which she was placed on lasix.  she continues to have mild edema.  Today, she denies symptoms of palpitations, chest pain, shortness of breath,  dizziness, presyncope, or syncope.  The patient is otherwise without complaint today.  The patient denies symptoms of fevers, chills, cough, or new SOB worrisome for COVID 19.  Past Medical History:  Diagnosis Date  . Allergy   . Arthritis   . 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 Cataract   . Chronic anxiety   . Constipation   . Diverticulosis of sigmoid colon   . Emphysema of lung ([place])   . GERD (gastroesophageal reflux disease)   . Glaucoma   . H/O adenomatous polyp of colon    Dr. Cristina Gong  . High cholesterol   . Hypertension   . Hypothyroidism   . Insomnia   . Lactose intolerance   . Lumbar degenerative disc disease   . Onychomycosis   . Osteoarthritis   . Osteoporosis   . Second degree Mobitz II AV block    s/p PPM  . Spinal stenosis   . Thyroid disease   . Varicose veins     Past Surgical History:  Procedure  Laterality Date  . ABDOMINAL HYSTERECTOMY    . BLADDER REPAIR    . blephoplasty bilateral  03/10/2014  . CATARACT EXTRACTION W/ INTRAOCULAR LENS  IMPLANT, BILATERAL  7 & 8 2014  . CHOLECYSTECTOMY    . COLONOSCOPY  05/18/2012  . EP IMPLANTABLE DEVICE N/A 08/16/2016   MDT Advisa MRI conditional PPM implanted by Dr Rayann Heman for mobitz II second degree AV block  . EYE SURGERY Right 05/02/2018   laser eye surgery  . herniated disc repair    . INCONTINENCE SURGERY    . JOINT REPLACEMENT    . LAMINECTOMY  02/2011   L3-L5  Dr. Ellene Route  . LUMBAR LAMINECTOMY/DECOMPRESSION MICRODISCECTOMY Right 09/21/2015   Procedure: Right Lumbar Three-FourMicrodiskectomy;  Surgeon: Kristeen Miss, MD;  Location: [facility];  Service: Neurosurgery;  Laterality: Right;  Right L3-4 Microdiskectomy  . SHOULDER ARTHROSCOPY WITH ROTATOR CUFF REPAIR Right 08/2003  . TONSILLECTOMY    . TOTAL HIP ARTHROPLASTY     right 1999    Current Outpatient Medications  Medication Sig Dispense Refill  . Calcium-Phosphorus-Vitamin D (CITRACAL +D3) 250-107-500 MG-MG-UNIT CHEW Chew 2 tablets by mouth daily.     . Cyanocobalamin (VITAMIN B 12 PO) Take 1 tablet by mouth daily.    Marland Kitchen denosumab (PROLIA) 60 MG/ML SOSY injection Inject 60 mg into the skin  every 6 (six) months.    . diltiazem (CARDIZEM CD) 240 MG 24 hr capsule TAKE 1 CAPSULE EVERY DAY. 90 capsule 1  . dorzolamide-timolol (COSOPT) 22.3-6.8 MG/ML ophthalmic solution Place 1 drop into both eyes 2 (two) times daily.  5  . furosemide (LASIX) 20 MG tablet TAKE 1 TABLET IN THE MORNING. 90 tablet 1  . ipratropium (ATROVENT) 0.06 % nasal spray USE 2 SPRAYS IN EACH NOSTRIL 3 TIMES A DAY AS NEEDED. 15 mL 1  . irbesartan (AVAPRO) 300 MG tablet TAKE 1 TABLET BY MOUTH DAILY. 90 tablet 1  . LACTASE PO Take 1 tablet by mouth as needed (when eating dairy products). Take as directed    . LUMIGAN 0.01 % SOLN Place 1 drop into both eyes 2 (two) times daily.   98  . Multiple Vitamins-Minerals  (MULTIVITAMIN WITH MINERALS) tablet Take 1 tablet by mouth daily.    Mckinley Jewel Dimesylate (RHOPRESSA) 0.02 % SOLN Apply 1 drop to eye at bedtime.    . Probiotic Product (ALIGN) 4 MG CAPS Take by mouth daily.    . rosuvastatin (CRESTOR) 10 MG tablet TAKE 1/2 TABLET ONCE DAILY FOR CHOLESTEROL 45 tablet 1  . SYNTHROID 125 MCG tablet TAKE 1 TABLET ONCE DAILY. 90 tablet 1   No current facility-administered medications for this visit.     Allergies:   Codeine, Macrobid [nitrofurantoin], Miralax [polyethylene glycol], Pain relief [acetaminophen], Paxil [paroxetine hcl], Percodan [oxycodone-aspirin], Oxycodone, Oxycodone-acetaminophen, and Penicillins   Social History:  The patient  reports that she has never smoked. She has never used smokeless tobacco. She reports that she does not drink alcohol or use drugs.   Family History:  The patient's family history includes Arthritis in her mother; Heart attack in her father; Osteoporosis in her mother.   ROS:  Please see the history of present illness.   All other systems are personally reviewed and negative.    Exam:    Vital Signs:  BP (!) 150/80   Pulse 82   Well sounding today   Labs/Other Tests and Data Reviewed:    Recent Labs: 11/22/2018: BUN 15; Creatinine 0.7; Potassium 4.2; Sodium 145   Wt Readings from Last 3 Encounters:  10/31/18 139 lb (63 kg)  09/26/18 138 lb (62.6 kg)  08/15/18 135 lb (61.2 kg)     Last device remote is reviewed from [facility] PDF which reveals normal device function, no arrhythmias    ASSESSMENT & PLAN:    1.  Mobitz II second degree AV block Normal device function by remotes Remotes are uptodate Not device dependant  2. HTN Elevated today Previously reports good control She will continue to follow this Sodium restriction is advised Could consider stopping diltiazem which may contribute to edema.  Carelink Return to see EP APP in a year  Patient Risk:  after full review of this patients  clinical status, I feel that they are at moderate risk at this time.  Today, I have spent 15 minutes with the patient with telehealth technology discussing arrhythmia management .    Army Fossa, MD  02/27/2019 12:29 PM     [place] [address] [phone] (office) [phone] (fax)

## 2019-02-27 NOTE — Telephone Encounter (Signed)
Spoke with pt regarding appt on 02/27/19. Pt stated she does not have a smart devise and would like a phone call. Pt was advise to check vitals prior to appt. Pt questions and concerns were address.

## 2019-03-06 ENCOUNTER — Other Ambulatory Visit: Payer: Self-pay

## 2019-03-06 ENCOUNTER — Ambulatory Visit: Payer: Medicare Other | Admitting: *Deleted

## 2019-03-06 DIAGNOSIS — M810 Age-related osteoporosis without current pathological fracture: Secondary | ICD-10-CM | POA: Diagnosis not present

## 2019-03-06 MED ORDER — DENOSUMAB 60 MG/ML ~~LOC~~ SOSY
60.0000 mg | PREFILLED_SYRINGE | Freq: Once | SUBCUTANEOUS | Status: AC
  Administered 2019-03-06: 60 mg via SUBCUTANEOUS

## 2019-03-13 DIAGNOSIS — H401113 Primary open-angle glaucoma, right eye, severe stage: Secondary | ICD-10-CM | POA: Diagnosis not present

## 2019-03-13 DIAGNOSIS — Z961 Presence of intraocular lens: Secondary | ICD-10-CM | POA: Diagnosis not present

## 2019-03-28 ENCOUNTER — Other Ambulatory Visit: Payer: Self-pay

## 2019-03-28 ENCOUNTER — Ambulatory Visit (INDEPENDENT_AMBULATORY_CARE_PROVIDER_SITE_OTHER): Payer: Medicare Other | Admitting: Internal Medicine

## 2019-03-28 ENCOUNTER — Encounter: Payer: Self-pay | Admitting: Internal Medicine

## 2019-03-28 VITALS — BP 138/70 | HR 80 | Temp 97.8°F | Ht 63.0 in | Wt 134.0 lb

## 2019-03-28 DIAGNOSIS — M62838 Other muscle spasm: Secondary | ICD-10-CM

## 2019-03-28 MED ORDER — MELOXICAM 7.5 MG PO TABS: 7.5000 mg | ORAL_TABLET | Freq: Every day | ORAL | 0 refills | Status: DC

## 2019-03-28 NOTE — Progress Notes (Signed)
Location:  [facility]   Advanced Directives 05/08/2018  Does Patient Have a Medical Advance Directive? Yes  Type of Advance Directive [place]  Does patient want to make changes to medical advance directive? No - Patient declined  Copy of [place] in Chart? Yes     Chief Complaint  Patient presents with  . Acute Visit    neck pain x2 weeks    HPI: Patient is a 85 y.o. female seen today for medical management of chronic diseases.    She presents with neck pain and neck spasms. The spasms have lasted two weeks and occur multiple times a day.The spasms are rated a 7/10 on pain scale. She is taking six tylenol a day and not going over the suggested amount per manufacturer. She is also taking a half of a muscle relaxant at bedtime for the pain.  In the past OT gave her neck exercises. She has been doing her exercises daily to her with her ROM. Her pillow is a couple of years old. Due to covid-19 she is having a hard time shopping for a new one.   She is able to ambulate with a cane. She denies any recent falls or injuries.    Past Medical History:  Diagnosis Date  . Allergy   . Arthritis   . 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 Cataract   . Chronic anxiety   . Constipation   . Diverticulosis of sigmoid colon   . Emphysema of lung ([place])   . GERD (gastroesophageal reflux disease)   . Glaucoma   . H/O adenomatous polyp of colon    Dr. Cristina Gong  . High cholesterol   . Hypertension   . Hypothyroidism   . Insomnia   . Lactose intolerance   . Lumbar degenerative disc disease   . Onychomycosis   . Osteoarthritis   . Osteoporosis   . Second degree Mobitz II AV block    s/p PPM  . Spinal stenosis   . Thyroid disease   . Varicose veins     Past Surgical History:  Procedure Laterality Date  . ABDOMINAL HYSTERECTOMY    . BLADDER REPAIR    . blephoplasty bilateral  03/10/2014  .  CATARACT EXTRACTION W/ INTRAOCULAR LENS  IMPLANT, BILATERAL  7 & 8 2014  . CHOLECYSTECTOMY    . COLONOSCOPY  05/18/2012  . EP IMPLANTABLE DEVICE N/A 08/16/2016   MDT Advisa MRI conditional PPM implanted by Dr Rayann Heman for mobitz II second degree AV block  . EYE SURGERY Right 05/02/2018   laser eye surgery  . herniated disc repair    . INCONTINENCE SURGERY    . JOINT REPLACEMENT    . LAMINECTOMY  02/2011   L3-L5  Dr. Ellene Route  . LUMBAR LAMINECTOMY/DECOMPRESSION MICRODISCECTOMY Right 09/21/2015   Procedure: Right Lumbar Three-FourMicrodiskectomy;  Surgeon: Kristeen Miss, MD;  Location: [facility];  Service: Neurosurgery;  Laterality: Right;  Right L3-4 Microdiskectomy  . SHOULDER ARTHROSCOPY WITH ROTATOR CUFF REPAIR Right 08/2003  . TONSILLECTOMY    . TOTAL HIP ARTHROPLASTY     right 1999    Allergies  Allergen Reactions  . Codeine Nausea Only  . Macrobid [Nitrofurantoin] Diarrhea  . Miralax [Polyethylene Glycol] Other (See Comments)    cramping  . Pain Relief [Acetaminophen] Nausea And Vomiting  . Paxil [Paroxetine Hcl] Nausea Only  . Percodan [Oxycodone-Aspirin] Itching  . Oxycodone Nausea Only  .  Oxycodone-Acetaminophen Nausea Only  . Penicillins Rash    Has patient had a PCN reaction causing immediate rash, facial/tongue/throat swelling, SOB or lightheadedness with hypotension: YES Has patient had a PCN reaction causing severe rash involving mucus membranes or skin necrosis: YES Has patient had a PCN reaction that required hospitalization NO Has patient had a PCN reaction occurring within the last 10 years: NO If all of the above answers are "NO", then may proceed with Cephalosporin use.    Outpatient Encounter Medications as of 03/28/2019  Medication Sig  . Calcium-Phosphorus-Vitamin D (CITRACAL +D3) 250-107-500 MG-MG-UNIT CHEW Chew 2 tablets by mouth daily.   . Cyanocobalamin (VITAMIN B 12 PO) Take 1 tablet by mouth daily.  Marland Kitchen denosumab (PROLIA) 60 MG/ML SOSY injection Inject 60  mg into the skin every 6 (six) months.  . diltiazem (CARDIZEM CD) 240 MG 24 hr capsule TAKE 1 CAPSULE EVERY DAY.  Marland Kitchen dorzolamide-timolol (COSOPT) 22.3-6.8 MG/ML ophthalmic solution Place 1 drop into both eyes 2 (two) times daily.  . furosemide (LASIX) 20 MG tablet TAKE 1 TABLET IN THE MORNING.  Marland Kitchen ipratropium (ATROVENT) 0.06 % nasal spray USE 2 SPRAYS IN EACH NOSTRIL 3 TIMES A DAY AS NEEDED.  Marland Kitchen irbesartan (AVAPRO) 300 MG tablet TAKE 1 TABLET BY MOUTH DAILY.  Marland Kitchen LACTASE PO Take 1 tablet by mouth as needed (when eating dairy products). Take as directed  . LUMIGAN 0.01 % SOLN Place 1 drop into both eyes 2 (two) times daily.   . Multiple Vitamins-Minerals (MULTIVITAMIN WITH MINERALS) tablet Take 1 tablet by mouth daily.  Mckinley Jewel Dimesylate (RHOPRESSA) 0.02 % SOLN Apply 1 drop to eye at bedtime.  . Probiotic Product (ALIGN) 4 MG CAPS Take by mouth daily.  . rosuvastatin (CRESTOR) 10 MG tablet TAKE 1/2 TABLET ONCE DAILY FOR CHOLESTEROL  . SYNTHROID 125 MCG tablet TAKE 1 TABLET ONCE DAILY.   No facility-administered encounter medications on file as of 03/28/2019.     Review of Systems:  Review of Systems  Constitutional: Positive for activity change. Negative for fatigue and unexpected weight change.  Respiratory: Negative for cough, shortness of breath and wheezing.   Cardiovascular: Negative for chest pain, palpitations and leg swelling.  Musculoskeletal: Positive for myalgias, neck pain and neck stiffness.  Neurological: Negative for dizziness, syncope and headaches.  Psychiatric/Behavioral: Negative for dysphoric mood. The patient is not nervous/anxious.     Health Maintenance  Topic Date Due  . PNA vac Low Risk Adult (2 of 2 - PCV13) 08/29/2014  . INFLUENZA VACCINE  03/30/2019  . TETANUS/TDAP  06/19/2022  . DEXA SCAN  Completed    Physical Exam: Vitals:   03/28/19 1150  BP: 138/70  Pulse: 80  Temp: 97.8 F (36.6 C)  TempSrc: Oral  SpO2: 95%  Weight: 134 lb (60.8 kg)   Height: 5\' 3"  (1.6 m)   Body mass index is 23.74 kg/m. Physical Exam Vitals signs reviewed.  Constitutional:      General: She is not in acute distress.    Appearance: Normal appearance. She is normal weight. She is not ill-appearing.  HENT:     Head: Normocephalic.  Neck:     Musculoskeletal: Normal range of motion. Neck rigidity and muscular tenderness present.     Vascular: No carotid bruit.     Comments: Difficulty with right lateral flexion and right rotation. No tenderness with palpation of neck.  Cardiovascular:     Rate and Rhythm: Normal rate and regular rhythm.     Pulses: Normal pulses.  Heart sounds: Normal heart sounds. No murmur.  Pulmonary:     Effort: Pulmonary effort is normal. No respiratory distress.     Breath sounds: Normal breath sounds. No wheezing.  Musculoskeletal:        General: No deformity or signs of injury.     Right lower leg: No edema.     Left lower leg: No edema.  Lymphadenopathy:     Cervical: No cervical adenopathy.  Skin:    General: Skin is warm and dry.  Neurological:     General: No focal deficit present.     Mental Status: She is alert and oriented to person, place, and time.     Sensory: No sensory deficit.     Gait: Gait normal.  Psychiatric:        Mood and Affect: Mood normal.        Behavior: Behavior normal.     Labs reviewed: Basic Metabolic Panel: Recent Labs    11/22/18  NA 145  K 4.2  BUN 15  CREATININE 0.7   Liver Function Tests: No results for input(s): AST, ALT, ALKPHOS, BILITOT, PROT, ALBUMIN in the last 8760 hours. No results for input(s): LIPASE, AMYLASE in the last 8760 hours. No results for input(s): AMMONIA in the last 8760 hours. CBC: No results for input(s): WBC, NEUTROABS, HGB, HCT, MCV, PLT in the last 8760 hours. Lipid Panel: No results for input(s): CHOL, HDL, LDLCALC, TRIG, CHOLHDL, LDLDIRECT in the last 8760 hours. Lab Results  Component Value Date   HGBA1C 6.3 03/27/2018     Procedures since last visit: No results found.  Assessment/Plan 1. Cervical paraspinal muscle spasms - Recommend Mobic 7.5 mg daily for 7 days to help with spasms and inflammation, take medication with food - drink plenty of water - Refer to OT for theraputic modalities - continue 1/2 muscle relaxant at bedtime - continue heat therapy to neck - purchase new bedtime pillow   Labs/tests ordered: None Next appt:  09/11/2019

## 2019-03-28 NOTE — Patient Instructions (Addendum)
I will send in one week of mobic 7.5mg  daily WITH FOOD to take during the day for your neck pain. Drink plenty of water. I will set you up with OT for therapeutic modalities again to help. You may use the 1/2 muscle relaxant at bedtime. Continue your heat.

## 2019-04-10 ENCOUNTER — Ambulatory Visit (INDEPENDENT_AMBULATORY_CARE_PROVIDER_SITE_OTHER): Payer: Medicare Other | Admitting: *Deleted

## 2019-04-10 DIAGNOSIS — M542 Cervicalgia: Secondary | ICD-10-CM | POA: Diagnosis not present

## 2019-04-10 DIAGNOSIS — M6389 Disorders of muscle in diseases classified elsewhere, multiple sites: Secondary | ICD-10-CM | POA: Diagnosis not present

## 2019-04-10 DIAGNOSIS — I441 Atrioventricular block, second degree: Secondary | ICD-10-CM

## 2019-04-10 DIAGNOSIS — M5033 Other cervical disc degeneration, cervicothoracic region: Secondary | ICD-10-CM | POA: Diagnosis not present

## 2019-04-10 DIAGNOSIS — M47892 Other spondylosis, cervical region: Secondary | ICD-10-CM | POA: Diagnosis not present

## 2019-04-10 LAB — CUP PACEART REMOTE DEVICE CHECK
Battery Remaining Longevity: 99 mo
Battery Voltage: 3.02 V
Brady Statistic AP VP Percent: 0.52 %
Brady Statistic AP VS Percent: 9.62 %
Brady Statistic AS VP Percent: 6.14 %
Brady Statistic AS VS Percent: 83.73 %
Brady Statistic RA Percent Paced: 10.12 %
Brady Statistic RV Percent Paced: 6.65 %
Date Time Interrogation Session: 20200812131218
Implantable Lead Implant Date: 20171219
Implantable Lead Implant Date: 20171219
Implantable Lead Location: 753859
Implantable Lead Location: 753860
Implantable Lead Model: 5076
Implantable Lead Model: 5076
Implantable Pulse Generator Implant Date: 20171219
Lead Channel Impedance Value: 304 Ohm
Lead Channel Impedance Value: 304 Ohm
Lead Channel Impedance Value: 342 Ohm
Lead Channel Impedance Value: 361 Ohm
Lead Channel Pacing Threshold Amplitude: 0.75 V
Lead Channel Pacing Threshold Amplitude: 1.5 V
Lead Channel Pacing Threshold Pulse Width: 0.4 ms
Lead Channel Pacing Threshold Pulse Width: 0.4 ms
Lead Channel Sensing Intrinsic Amplitude: 2.75 mV
Lead Channel Sensing Intrinsic Amplitude: 2.75 mV
Lead Channel Sensing Intrinsic Amplitude: 6 mV
Lead Channel Sensing Intrinsic Amplitude: 6 mV
Lead Channel Setting Pacing Amplitude: 2 V
Lead Channel Setting Pacing Amplitude: 3.25 V
Lead Channel Setting Pacing Pulse Width: 0.4 ms
Lead Channel Setting Sensing Sensitivity: 2.8 mV

## 2019-04-16 DIAGNOSIS — M5033 Other cervical disc degeneration, cervicothoracic region: Secondary | ICD-10-CM | POA: Diagnosis not present

## 2019-04-16 DIAGNOSIS — M542 Cervicalgia: Secondary | ICD-10-CM | POA: Diagnosis not present

## 2019-04-16 DIAGNOSIS — M6389 Disorders of muscle in diseases classified elsewhere, multiple sites: Secondary | ICD-10-CM | POA: Diagnosis not present

## 2019-04-16 DIAGNOSIS — M47892 Other spondylosis, cervical region: Secondary | ICD-10-CM | POA: Diagnosis not present

## 2019-04-19 ENCOUNTER — Encounter: Payer: Self-pay | Admitting: Cardiology

## 2019-04-19 NOTE — Progress Notes (Signed)
Remote pacemaker transmission.   

## 2019-04-23 DIAGNOSIS — M6389 Disorders of muscle in diseases classified elsewhere, multiple sites: Secondary | ICD-10-CM | POA: Diagnosis not present

## 2019-04-23 DIAGNOSIS — M47892 Other spondylosis, cervical region: Secondary | ICD-10-CM | POA: Diagnosis not present

## 2019-04-23 DIAGNOSIS — M542 Cervicalgia: Secondary | ICD-10-CM | POA: Diagnosis not present

## 2019-04-23 DIAGNOSIS — M5033 Other cervical disc degeneration, cervicothoracic region: Secondary | ICD-10-CM | POA: Diagnosis not present

## 2019-04-25 ENCOUNTER — Other Ambulatory Visit: Payer: Self-pay | Admitting: Internal Medicine

## 2019-04-30 DIAGNOSIS — M47892 Other spondylosis, cervical region: Secondary | ICD-10-CM | POA: Diagnosis not present

## 2019-04-30 DIAGNOSIS — M6389 Disorders of muscle in diseases classified elsewhere, multiple sites: Secondary | ICD-10-CM | POA: Diagnosis not present

## 2019-04-30 DIAGNOSIS — M542 Cervicalgia: Secondary | ICD-10-CM | POA: Diagnosis not present

## 2019-04-30 DIAGNOSIS — M5033 Other cervical disc degeneration, cervicothoracic region: Secondary | ICD-10-CM | POA: Diagnosis not present

## 2019-05-07 DIAGNOSIS — Z23 Encounter for immunization: Secondary | ICD-10-CM | POA: Diagnosis not present

## 2019-05-08 ENCOUNTER — Encounter: Payer: Self-pay | Admitting: Internal Medicine

## 2019-05-14 DIAGNOSIS — E039 Hypothyroidism, unspecified: Secondary | ICD-10-CM | POA: Diagnosis not present

## 2019-05-14 DIAGNOSIS — E034 Atrophy of thyroid (acquired): Secondary | ICD-10-CM | POA: Diagnosis not present

## 2019-05-14 DIAGNOSIS — I10 Essential (primary) hypertension: Secondary | ICD-10-CM | POA: Diagnosis not present

## 2019-05-14 DIAGNOSIS — R739 Hyperglycemia, unspecified: Secondary | ICD-10-CM | POA: Diagnosis not present

## 2019-05-14 DIAGNOSIS — E119 Type 2 diabetes mellitus without complications: Secondary | ICD-10-CM | POA: Diagnosis not present

## 2019-05-14 DIAGNOSIS — D649 Anemia, unspecified: Secondary | ICD-10-CM | POA: Diagnosis not present

## 2019-05-14 DIAGNOSIS — E785 Hyperlipidemia, unspecified: Secondary | ICD-10-CM | POA: Diagnosis not present

## 2019-05-14 LAB — BASIC METABOLIC PANEL
BUN: 13 (ref 4–21)
Creatinine: 0.6 (ref 0.5–1.1)
Glucose: 95
Potassium: 4.1 (ref 3.4–5.3)
Sodium: 145 (ref 137–147)

## 2019-05-14 LAB — LIPID PANEL
Cholesterol: 143 (ref 0–200)
HDL: 65 (ref 35–70)
LDL Cholesterol: 53
Triglycerides: 127 (ref 40–160)

## 2019-05-14 LAB — CBC AND DIFFERENTIAL
HCT: 39 (ref 36–46)
Hemoglobin: 12.8 (ref 12.0–16.0)
Platelets: 230 (ref 150–399)
WBC: 6.7

## 2019-05-14 LAB — TSH: TSH: 2.3 (ref 0.41–5.90)

## 2019-05-14 LAB — HEPATIC FUNCTION PANEL
ALT: 18 (ref 7–35)
AST: 22 (ref 13–35)
Alkaline Phosphatase: 87 (ref 25–125)
Bilirubin, Total: 0.4

## 2019-05-14 LAB — HEMOGLOBIN A1C: Hemoglobin A1C: 5.6

## 2019-05-21 ENCOUNTER — Encounter: Payer: Self-pay | Admitting: Internal Medicine

## 2019-05-22 ENCOUNTER — Non-Acute Institutional Stay: Payer: Medicare Other | Admitting: Internal Medicine

## 2019-05-22 ENCOUNTER — Other Ambulatory Visit: Payer: Self-pay

## 2019-05-22 ENCOUNTER — Encounter: Payer: Self-pay | Admitting: Internal Medicine

## 2019-05-22 VITALS — BP 135/78 | HR 78 | Temp 97.8°F | Ht 63.0 in | Wt 136.0 lb

## 2019-05-22 DIAGNOSIS — I10 Essential (primary) hypertension: Secondary | ICD-10-CM

## 2019-05-22 DIAGNOSIS — M2141 Flat foot [pes planus] (acquired), right foot: Secondary | ICD-10-CM | POA: Diagnosis not present

## 2019-05-22 DIAGNOSIS — R600 Localized edema: Secondary | ICD-10-CM | POA: Diagnosis not present

## 2019-05-22 DIAGNOSIS — Z7189 Other specified counseling: Secondary | ICD-10-CM | POA: Diagnosis not present

## 2019-05-22 DIAGNOSIS — M2142 Flat foot [pes planus] (acquired), left foot: Secondary | ICD-10-CM | POA: Diagnosis not present

## 2019-05-22 DIAGNOSIS — R739 Hyperglycemia, unspecified: Secondary | ICD-10-CM

## 2019-05-22 DIAGNOSIS — E034 Atrophy of thyroid (acquired): Secondary | ICD-10-CM | POA: Diagnosis not present

## 2019-05-22 NOTE — Progress Notes (Signed)
Location:  [facility] Service:  Clinic (12)  Provider: Donise Woodle L. Mariea Clonts, D.O., C.M.D.  Code Status: DNR Goals of Care:  Advanced Directives 05/22/2019  Does Patient Have a Medical Advance Directive? Yes  Type of Advance Directive [place]  Does patient want to make changes to medical advance directive? No - Patient declined  Copy of [place] in Chart? Yes - validated most recent copy scanned in chart (See row information)   Chief Complaint  Patient presents with  . Medical Management of Chronic Issues    2 month follow-up   . Immunizations     Discuss need for PNA     HPI: Patient is a 85 y.o. female seen today for medical management of chronic diseases.    She's been walking more and her hba1c did get better.    We had done an echo in September to evaluate her heart--diastolic function not evaluated.  We changed her bp med from irbesartan/hct to irbesartan and lasix daily.  She's eating a banana.  Edema is better.  She's having a lot of trouble with knees here lately and thinks arches have fallen.    Past Medical History:  Diagnosis Date  . Allergy   . Arthritis   . 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 Cataract   . Chronic anxiety   . Constipation   . Diverticulosis of sigmoid colon   . Emphysema of lung ([place])   . GERD (gastroesophageal reflux disease)   . Glaucoma   . H/O adenomatous polyp of colon    Dr. Cristina Gong  . High cholesterol   . Hypertension   . Hypothyroidism   . Insomnia   . Lactose intolerance   . Lumbar degenerative disc disease   . Onychomycosis   . Osteoarthritis   . Osteoporosis   . Second degree Mobitz II AV block    s/p PPM  . Spinal stenosis   . Thyroid disease   . Varicose veins     Past Surgical History:  Procedure Laterality Date  . ABDOMINAL HYSTERECTOMY    . BLADDER REPAIR    . blephoplasty bilateral   03/10/2014  . CATARACT EXTRACTION W/ INTRAOCULAR LENS  IMPLANT, BILATERAL  7 & 8 2014  . CHOLECYSTECTOMY    . COLONOSCOPY  05/18/2012  . EP IMPLANTABLE DEVICE N/A 08/16/2016   MDT Advisa MRI conditional PPM implanted by Dr Rayann Heman for mobitz II second degree AV block  . EYE SURGERY Right 05/02/2018   laser eye surgery  . herniated disc repair    . INCONTINENCE SURGERY    . JOINT REPLACEMENT    . LAMINECTOMY  02/2011   L3-L5  Dr. Ellene Route  . LUMBAR LAMINECTOMY/DECOMPRESSION MICRODISCECTOMY Right 09/21/2015   Procedure: Right Lumbar Three-FourMicrodiskectomy;  Surgeon: Kristeen Miss, MD;  Location: [facility];  Service: Neurosurgery;  Laterality: Right;  Right L3-4 Microdiskectomy  . SHOULDER ARTHROSCOPY WITH ROTATOR CUFF REPAIR Right 08/2003  . TONSILLECTOMY    . TOTAL HIP ARTHROPLASTY     right 1999    Allergies  Allergen Reactions  . Codeine Nausea Only  . Macrobid [Nitrofurantoin] Diarrhea  . Miralax [Polyethylene Glycol] Other (See Comments)    cramping  . Pain Relief [Acetaminophen] Nausea And Vomiting  . Paxil [Paroxetine Hcl] Nausea Only  . Percodan [Oxycodone-Aspirin] Itching  . Oxycodone Nausea Only  . Oxycodone-Acetaminophen Nausea Only  . Penicillins  Rash    Has patient had a PCN reaction causing immediate rash, facial/tongue/throat swelling, SOB or lightheadedness with hypotension: YES Has patient had a PCN reaction causing severe rash involving mucus membranes or skin necrosis: YES Has patient had a PCN reaction that required hospitalization NO Has patient had a PCN reaction occurring within the last 10 years: NO If all of the above answers are "NO", then may proceed with Cephalosporin use.    Outpatient Encounter Medications as of 05/22/2019  Medication Sig  . Calcium-Phosphorus-Vitamin D (CITRACAL +D3) 250-107-500 MG-MG-UNIT CHEW Chew 2 tablets by mouth daily.   Marland Kitchen denosumab (PROLIA) 60 MG/ML SOSY injection Inject 60 mg into the skin every 6 (six) months.  . diltiazem  (CARDIZEM CD) 240 MG 24 hr capsule TAKE 1 CAPSULE EVERY DAY.  Marland Kitchen dorzolamide-timolol (COSOPT) 22.3-6.8 MG/ML ophthalmic solution Place 1 drop into both eyes 2 (two) times daily.  . furosemide (LASIX) 20 MG tablet TAKE 1 TABLET IN THE MORNING.  Marland Kitchen ipratropium (ATROVENT) 0.06 % nasal spray USE 2 SPRAYS IN EACH NOSTRIL 3 TIMES A DAY AS NEEDED.  Marland Kitchen irbesartan (AVAPRO) 300 MG tablet TAKE 1 TABLET BY MOUTH DAILY.  Marland Kitchen LACTASE PO Take 1 tablet by mouth as needed (when eating dairy products). Take as directed  . LUMIGAN 0.01 % SOLN Place 1 drop into both eyes 2 (two) times daily.   . Multiple Vitamins-Minerals (MULTIVITAMIN WITH MINERALS) tablet Take 1 tablet by mouth daily.  Mckinley Jewel Dimesylate (RHOPRESSA) 0.02 % SOLN Apply 1 drop to eye at bedtime.  . rosuvastatin (CRESTOR) 10 MG tablet TAKE 1/2 TABLET ONCE DAILY FOR CHOLESTEROL  . SYNTHROID 125 MCG tablet TAKE 1 TABLET ONCE DAILY.  Marland Kitchen Cyanocobalamin (VITAMIN B 12 PO) Take 1 tablet by mouth daily.  . Probiotic Product (ALIGN) 4 MG CAPS Take by mouth daily.  . [DISCONTINUED] meloxicam (MOBIC) 7.5 MG tablet Take 1 tablet (7.5 mg total) by mouth daily.   No facility-administered encounter medications on file as of 05/22/2019.     Review of Systems:  Review of Systems  Constitutional: Negative for chills, fever and malaise/fatigue.  Eyes: Negative for blurred vision.  Respiratory: Negative for cough and shortness of breath.   Cardiovascular: Negative for chest pain, palpitations and leg swelling.  Gastrointestinal: Negative for abdominal pain, constipation and diarrhea.       Did not mention her flatus concern this time  Genitourinary: Negative for dysuria.  Musculoskeletal: Positive for back pain, joint pain, myalgias and neck pain. Negative for falls.  Skin: Negative for itching and rash.  Neurological: Negative for loss of consciousness and headaches.  Psychiatric/Behavioral: Positive for depression. Negative for memory loss. The patient is not  nervous/anxious.     Health Maintenance  Topic Date Due  . PNA vac Low Risk Adult (2 of 2 - PCV13) 08/29/2014  . TETANUS/TDAP  06/19/2022  . INFLUENZA VACCINE  Completed  . DEXA SCAN  Completed    Physical Exam: Vitals:   05/22/19 1113  BP: 135/78  Pulse: 78  Temp: 97.8 F (36.6 C)  TempSrc: Oral  SpO2: 97%  Weight: 136 lb (61.7 kg)  Height: 5\' 3"  (1.6 m)   Body mass index is 24.09 kg/m. Physical Exam Vitals signs reviewed.  Constitutional:      General: She is not in acute distress.    Appearance: Normal appearance. She is not toxic-appearing.  HENT:     Head: Normocephalic and atraumatic.  Cardiovascular:     Rate and Rhythm: Normal rate and  regular rhythm.     Pulses: Normal pulses.     Heart sounds: Normal heart sounds.  Pulmonary:     Effort: Pulmonary effort is normal.     Breath sounds: Normal breath sounds. No rales.  Abdominal:     General: Bowel sounds are normal. There is no distension.     Palpations: Abdomen is soft. There is no mass.  Musculoskeletal: Normal range of motion.     Right lower leg: Edema present.     Left lower leg: Edema present.     Comments: Reports improved edema  Skin:    General: Skin is warm and dry.  Neurological:     General: No focal deficit present.     Mental Status: She is alert and oriented to person, place, and time.  Psychiatric:        Mood and Affect: Mood normal.        Behavior: Behavior normal.     Labs reviewed: Basic Metabolic Panel: Recent Labs    11/22/18 05/14/19  NA 145 145  K 4.2 4.1  BUN 15 13  CREATININE 0.7 0.6  TSH  --  2.30   Liver Function Tests: Recent Labs    05/14/19  AST 22  ALT 18  ALKPHOS 87   No results for input(s): LIPASE, AMYLASE in the last 8760 hours. No results for input(s): AMMONIA in the last 8760 hours. CBC: Recent Labs    05/14/19  WBC 6.7  HGB 12.8  HCT 39  PLT 230   Lipid Panel: Recent Labs    05/14/19  CHOL 143  HDL 65  LDLCALC 53  TRIG 127    Lab Results  Component Value Date   HGBA1C 5.6 05/14/2019    Assessment/Plan 1. Pes planus of both feet - new concern, says feet turn inward as walks and she has pain in medial arches, she's noted they are flatter than before - Ambulatory referral to Podiatry--suspect needs orthotics  2. Bilateral lower extremity edema -improved some with diuretic, echo was unable to tell if she had diastolic dysfunction  3. ACP (advance care planning) - spent about 18 mins reviewing ACP documents she has  -she would not want CPR if she had a cardiopulmonary arrest--she discussed family experiences and would not want her children to have to make difficult decisions if she got put on a machine - Do not attempt resuscitation (DNR) form completed and order entered, will be scanned in vynca  4. Essential hypertension -bp at goal, cont current regimen  5. Hyperglycemia -labs better since back to some of her exercise, cont this as feet allow  6. Hypothyroidism due to acquired atrophy of thyroid -cont current levothyroxine therapy and monitor  Labs/tests ordered:  No new Next appt:  09/11/2019  Tayshawn Purnell L. Kamauri Kathol, D.O. [facility] [address], [place] [address] Cell Phone (Mon-Fri 8am-5pm):  [phone] On Call:  [phone] & follow prompts after 5pm & weekends Office Phone:  [phone] Office Fax:  [phone]

## 2019-06-10 ENCOUNTER — Other Ambulatory Visit: Payer: Self-pay | Admitting: Internal Medicine

## 2019-06-17 ENCOUNTER — Other Ambulatory Visit: Payer: Self-pay | Admitting: Internal Medicine

## 2019-06-20 DIAGNOSIS — Z85828 Personal history of other malignant neoplasm of skin: Secondary | ICD-10-CM | POA: Diagnosis not present

## 2019-06-20 DIAGNOSIS — B078 Other viral warts: Secondary | ICD-10-CM | POA: Diagnosis not present

## 2019-06-20 DIAGNOSIS — L905 Scar conditions and fibrosis of skin: Secondary | ICD-10-CM | POA: Diagnosis not present

## 2019-06-20 DIAGNOSIS — D485 Neoplasm of uncertain behavior of skin: Secondary | ICD-10-CM | POA: Diagnosis not present

## 2019-06-20 DIAGNOSIS — L821 Other seborrheic keratosis: Secondary | ICD-10-CM | POA: Diagnosis not present

## 2019-06-20 DIAGNOSIS — D225 Melanocytic nevi of trunk: Secondary | ICD-10-CM | POA: Diagnosis not present

## 2019-06-20 DIAGNOSIS — D2230 Melanocytic nevi of unspecified part of face: Secondary | ICD-10-CM | POA: Diagnosis not present

## 2019-06-20 DIAGNOSIS — Z23 Encounter for immunization: Secondary | ICD-10-CM | POA: Diagnosis not present

## 2019-06-26 ENCOUNTER — Encounter: Payer: Self-pay | Admitting: Family

## 2019-06-26 ENCOUNTER — Other Ambulatory Visit: Payer: Self-pay

## 2019-06-26 ENCOUNTER — Ambulatory Visit (INDEPENDENT_AMBULATORY_CARE_PROVIDER_SITE_OTHER): Payer: Medicare Other | Admitting: Family

## 2019-06-26 DIAGNOSIS — Z0000 Encounter for general adult medical examination without abnormal findings: Secondary | ICD-10-CM

## 2019-06-26 NOTE — Progress Notes (Signed)
Subjective:   Destiny [name] is a 85 y.o. female who presents for Medicare Annual (Subsequent) preventive examination.  Review of Systems:   Cardiac Risk Factors include: advanced age (>64men, >65 women);dyslipidemia;hypertension     Objective:     Vitals: There were no vitals taken for this visit.  There is no height or weight on file to calculate BMI.  Advanced Directives 06/26/2019 05/22/2019 05/08/2018 12/13/2017 08/02/2017 05/24/2017 03/29/2017  Does Patient Have a Medical Advance Directive? Yes Yes Yes Yes Yes Yes Yes  Type of Advance Directive Out of facility DNR (pink MOST or yellow form) Healthcare Power of [place] of [place] of [place];Living will  Does patient want to make changes to medical advance directive? No - Patient declined No - Patient declined No - Patient declined No - Patient declined No - Patient declined - -  Copy of [place] in Chart? No - copy requested Yes - validated most recent copy scanned in chart (See row information) Yes Yes Yes Yes Yes  Pre-existing out of facility DNR order (yellow form or pink MOST form) Yellow form placed in chart (order not valid for inpatient use) - - - - - -    Tobacco Social History   Tobacco Use  Smoking Status Never Smoker  Smokeless Tobacco Never Used     Counseling given: Not Answered   Clinical Intake:  Pre-visit preparation completed: No  Pain : 0-10 Pain Score: 4  Pain Type: Chronic pain Pain Location: Back Pain Orientation: Lower Pain Radiating Towards: right leg Pain Descriptors / Indicators: Aching Pain Onset: (several years) Pain Frequency: Intermittent Pain Relieving Factors: Aleve,Rest,heat Effect of Pain on Daily Activities: No  Pain Relieving Factors: Aleve,Rest,heat  BMI - recorded: 24.1 Nutritional Status: BMI of 19-24  Normal Nutritional Risks: None  Diabetes: No  How often do you need to have someone help you when you read instructions, pamphlets, or other written materials from your doctor or pharmacy?: 1 - Never What is the last grade level you completed in school?: Master Degree  Interpreter Needed?: No  Information entered by :: Jasani Dolney FNP-C  Past Medical History:  Diagnosis Date  . Allergy   . Arthritis   . 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 Cataract   . Chronic anxiety   . Constipation   . Diverticulosis of sigmoid colon   . Emphysema of lung ([place])   . GERD (gastroesophageal reflux disease)   . Glaucoma   . H/O adenomatous polyp of colon    Dr. Cristina Gong  . High cholesterol   . Hypertension   . Hypothyroidism   . Insomnia   . Lactose intolerance   . Lumbar degenerative disc disease   . Onychomycosis   . Osteoarthritis   . Osteoporosis   . Second degree Mobitz II AV block    s/p PPM  . Spinal stenosis   . Thyroid disease   . Varicose veins    Past Surgical History:  Procedure Laterality Date  . ABDOMINAL HYSTERECTOMY    . BLADDER REPAIR    . blephoplasty bilateral  03/10/2014  . CATARACT EXTRACTION W/ INTRAOCULAR LENS  IMPLANT, BILATERAL  7 & 8 2014  . CHOLECYSTECTOMY    . COLONOSCOPY  05/18/2012  . EP IMPLANTABLE DEVICE N/A 08/16/2016   MDT Advisa MRI conditional PPM implanted by Dr  Allred for mobitz II second degree AV block  . EYE SURGERY Right 05/02/2018   laser eye surgery  . herniated disc repair    . INCONTINENCE SURGERY    . JOINT REPLACEMENT    . LAMINECTOMY  02/2011   L3-L5  Dr. Ellene Route  . LUMBAR LAMINECTOMY/DECOMPRESSION MICRODISCECTOMY Right 09/21/2015   Procedure: Right Lumbar Three-FourMicrodiskectomy;  Surgeon: Kristeen Miss, MD;  Location: [facility];  Service: Neurosurgery;  Laterality: Right;  Right L3-4 Microdiskectomy  . SHOULDER ARTHROSCOPY WITH ROTATOR CUFF REPAIR Right 08/2003  . TONSILLECTOMY    . TOTAL HIP  ARTHROPLASTY     right 1999   Family History  Problem Relation Age of Onset  . Arthritis Mother   . Osteoporosis Mother   . Heart attack Father    Social History   Socioeconomic History  . Marital status: Widowed    Spouse name: Not on file  . Number of children: Not on file  . Years of education: Not on file  . Highest education level: Not on file  Occupational History  . Not on file  Social Needs  . Financial resource strain: Not hard at all  . Food insecurity    Worry: Never true    Inability: Never true  . Transportation needs    Medical: No    Non-medical: No  Tobacco Use  . Smoking status: Never Smoker  . Smokeless tobacco: Never Used  Substance and Sexual Activity  . Alcohol use: No  . Drug use: No  . Sexual activity: Never  Lifestyle  . Physical activity    Days per week: 3 days    Minutes per session: 40 min  . Stress: Only a little  Relationships  . Social connections    Talks on phone: More than three times a week    Gets together: More than three times a week    Attends religious service: Never    Active member of club or organization: No    Attends meetings of clubs or organizations: Never    Relationship status: Widowed  Other Topics Concern  . Not on file  Social History Narrative   Lives at [facility]   Caffeine 1-2 cups daily   Exercise water aerobics, walking   Never smoked   POA               Do you drink/ eat things with caffeine? Yes      Marital status:    Widow                           What year were you married ? 1957      Do you live in a house, apartment,assistred living, condo, trailer, etc.)?[place] [facility]  Living      Is it one or more stories?       How many persons live in your home ? 1      Do you have any pets in your home ?(please list)0       Current or past profession: Social Worker      Do you exercise?  Yes                            Type & how often:  Water Aerobic, 3-4 hours per week        Do you have a living will? Yes  Do you have a DNR form? Yes                       If not, do you want to discuss one?       Do you have signed POA?HPOA forms?                 If so, please bring to your        appointment          Outpatient Encounter Medications as of 06/26/2019  Medication Sig  . Calcium-Phosphorus-Vitamin D (CITRACAL +D3) 250-107-500 MG-MG-UNIT CHEW Chew 2 tablets by mouth daily.   . Cyanocobalamin (VITAMIN B 12 PO) Take 1 tablet by mouth daily.  Marland Kitchen denosumab (PROLIA) 60 MG/ML SOSY injection Inject 60 mg into the skin every 6 (six) months.  . diltiazem (CARDIZEM CD) 240 MG 24 hr capsule TAKE 1 CAPSULE EVERY DAY.  Marland Kitchen dorzolamide-timolol (COSOPT) 22.3-6.8 MG/ML ophthalmic solution Place 1 drop into both eyes 2 (two) times daily.  . furosemide (LASIX) 20 MG tablet TAKE 1 TABLET IN THE MORNING.  Marland Kitchen ipratropium (ATROVENT) 0.06 % nasal spray USE 2 SPRAYS IN EACH NOSTRIL 3 TIMES A DAY AS NEEDED.  Marland Kitchen irbesartan (AVAPRO) 300 MG tablet TAKE 1 TABLET BY MOUTH DAILY.  Marland Kitchen LACTASE PO Take 1 tablet by mouth as needed (when eating dairy products). Take as directed  . LUMIGAN 0.01 % SOLN Place 1 drop into both eyes 2 (two) times daily.   . Multiple Vitamins-Minerals (MULTIVITAMIN WITH MINERALS) tablet Take 1 tablet by mouth daily.  Mckinley Jewel Dimesylate (RHOPRESSA) 0.02 % SOLN Apply 1 drop to eye at bedtime. Right eye  . Probiotic Product (ALIGN) 4 MG CAPS Take by mouth daily.  . rosuvastatin (CRESTOR) 10 MG tablet TAKE 1/2 TABLET ONCE DAILY FOR CHOLESTEROL  . SYNTHROID 125 MCG tablet TAKE 1 TABLET ONCE DAILY.  Marland Kitchen UNABLE TO FIND Take by mouth. Med Name: OTC purified peppermint oil for IBS - 3 caps before lunch and dinner   No facility-administered encounter medications on file as of 06/26/2019.     Activities of Daily Living In your present state of health, do you have any difficulty performing the following activities: 06/26/2019  Hearing? N  Vision? N  Difficulty  concentrating or making decisions? N  Walking or climbing stairs? Y  Comment limited due to back pain  Dressing or bathing? N  Doing errands, shopping? N  Preparing Food and eating ? N  Using the Toilet? N  In the past six months, have you accidently leaked urine? Y  Comment wears pads  Do you have problems with loss of bowel control? N  Managing your Medications? N  Managing your Finances? N  Housekeeping or managing your Housekeeping? Y  Comment facility assist  Some recent data might be hidden    Patient Care Team: Gayland Curry, DO as PCP - General (Geriatric Medicine) Thompson Grayer, MD as PCP - Electrophysiology (Cardiology) Deland Pretty, MD (Internal Medicine) Ronald Lobo, MD as Consulting Physician (Gastroenterology) Jari Pigg, MD as Consulting Physician (Dermatology) Shon Hough, MD as Consulting Physician (Ophthalmology) Kristeen Miss, MD as Consulting Physician (Neurosurgery) Justice Britain, MD as Consulting Physician (Orthopedic Surgery) Gaynelle Arabian, MD as Consulting Physician (Orthopedic Surgery) Suella Broad, MD as Consulting Physician (Physical Medicine and Rehabilitation)    Assessment:   This is a routine wellness examination for Terrence.  Exercise Activities and Dietary recommendations Current Exercise Habits: Home exercise routine, Type of exercise:  walking(water aerobics), Time (Minutes): 60, Frequency (Times/Week): 3, Weekly Exercise (Minutes/Week): 180, Intensity: Mild, Exercise limited by: Other - see comments(back pain)  Goals    . Increase water intake     Patient will try to increase water intake       Fall Risk Fall Risk  06/26/2019 05/22/2019 10/31/2018 09/26/2018 08/15/2018  Falls in the past year? 0 0 0 0 0  Number falls in past yr: - 0 0 0 0  Injury with Fall? - 0 0 0 0   Is the patient's home free of loose throw rugs in walkways, pet beds, electrical cords, etc?   no      Grab bars in the bathroom? yes      Handrails on  the stairs?   yes      Adequate lighting?   yes  Depression Screen PHQ 2/9 Scores 06/26/2019 05/22/2019 10/31/2018 09/26/2018  PHQ - 2 Score 0 0 0 0     Cognitive Function MMSE - Mini Mental State Exam 05/08/2018 03/28/2017  Orientation to time 5 5  Orientation to Place 5 5  Registration 3 3  Attention/ Calculation 5 5  Recall 3 3  Language- name 2 objects 2 2  Language- repeat 1 1  Language- follow 3 step command 3 3  Language- read & follow direction 1 1  Write a sentence 1 1  Copy design 1 1  Total score 30 30     6CIT Screen 06/26/2019  What Year? 0 points  What month? 0 points  What time? 0 points  Count back from 20 0 points  Months in reverse 0 points  Repeat phrase 0 points  Total Score 0    Immunization History  Administered Date(s) Administered  . DTaP 06/19/2012  . Influenza,inj,Quad PF,6+ Mos 05/12/2016, 06/22/2018, 05/07/2019  . Influenza-Unspecified 08/29/2014, 06/19/2017  . Pneumococcal Polysaccharide-23 12/11/1999  . Pneumococcal-Unspecified 08/29/2013  . Zoster 06/24/2006  . Zoster Recombinat (Shingrix) 03/30/2017, 06/16/2017    Qualifies for Shingles Vaccine? Up to date   Screening Tests Health Maintenance  Topic Date Due  . PNA vac Low Risk Adult (2 of 2 - PCV13) 06/25/2020 (Originally 08/29/2014)  . TETANUS/TDAP  06/19/2022  . INFLUENZA VACCINE  Completed  . DEXA SCAN  Completed    Cancer Screenings: Lung: Low Dose CT Chest recommended if Age 80-80 years, 30 pack-year currently smoking OR have quit w/in 15years. Patient does not qualify. Breast:  Up to date on Mammogram? Yes Age out   Up to date of Bone Density/Dexa? Yes Colorectal:Age out   Additional Screenings:  Hepatitis C Screening: Low   Plan:   I have personally reviewed and noted the following in the patient's chart:   . Medical and social history . Use of alcohol, tobacco or illicit drugs  . Current medications and supplements . Functional ability and status . Nutritional  status . Physical activity . Advanced directives . List of other physicians . Hospitalizations, surgeries, and ER visits in previous 12 months . Vitals . Screenings to include cognitive, depression, and falls . Referrals and appointments  In addition, I have reviewed and discussed with patient certain preventive protocols, quality metrics, and best practice recommendations. A written personalized care plan for preventive services as well as general preventive health recommendations were provided to patient.  Sandrea Hughs, NP  06/26/2019

## 2019-06-26 NOTE — Patient Instructions (Signed)
Destiny [name] , Thank you for taking time to come for your Medicare Wellness Visit. I appreciate your ongoing commitment to your health goals. Please review the following plan we discussed and let me know if I can assist you in the future.   Screening recommendations/referrals: Colonoscopy: N/A  Mammogram: N/A  Bone Density: Up to date  Recommended yearly ophthalmology/optometry visit for glaucoma screening and checkup Recommended yearly dental visit for hygiene and checkup  Vaccinations: Influenza vaccine : Up to date  Pneumococcal vaccine : Up to date  Tdap vaccine : Up to date due 06/19/2022  Shingles vaccine : Up to date     Advanced directives: Yes   Conditions/risks identified: Advance age female > 15 yrs,dyslipidemia and Hypertension   Next appointment: 1 year    Preventive Care 7 Years and Older, Female Preventive care refers to lifestyle choices and visits with your health care provider that can promote health and wellness. What does preventive care include?  A yearly physical exam. This is also called an annual well check.  Dental exams once or twice a year.  Routine eye exams. Ask your health care provider how often you should have your eyes checked.  Personal lifestyle choices, including:  Daily care of your teeth and gums.  Regular physical activity.  Eating a healthy diet.  Avoiding tobacco and drug use.  Limiting alcohol use.  Practicing safe sex.  Taking low-dose aspirin every day.  Taking vitamin and mineral supplements as recommended by your health care provider. What happens during an annual well check? The services and screenings done by your health care provider during your annual well check will depend on your age, overall health, lifestyle risk factors, and family history of disease. Counseling  Your health care provider may ask you questions about your:  Alcohol use.  Tobacco use.  Drug use.  Emotional well-being.  Home and  relationship well-being.  Sexual activity.  Eating habits.  History of falls.  Memory and ability to understand (cognition).  Work and work Statistician.  Reproductive health. Screening  You may have the following tests or measurements:  Height, weight, and BMI.  Blood pressure.  Lipid and cholesterol levels. These may be checked every 5 years, or more frequently if you are over 25 years old.  Skin check.  Lung cancer screening. You may have this screening every year starting at age 41 if you have a 30-pack-year history of smoking and currently smoke or have quit within the past 15 years.  Fecal occult blood test (FOBT) of the stool. You may have this test every year starting at age 40.  Flexible sigmoidoscopy or colonoscopy. You may have a sigmoidoscopy every 5 years or a colonoscopy every 10 years starting at age 75.  Hepatitis C blood test.  Hepatitis B blood test.  Sexually transmitted disease (STD) testing.  Diabetes screening. This is done by checking your blood sugar (glucose) after you have not eaten for a while (fasting). You may have this done every 1-3 years.  Bone density scan. This is done to screen for osteoporosis. You may have this done starting at age 17.  Mammogram. This may be done every 1-2 years. Talk to your health care provider about how often you should have regular mammograms. Talk with your health care provider about your test results, treatment options, and if necessary, the need for more tests. Vaccines  Your health care provider may recommend certain vaccines, such as:  Influenza vaccine. This is recommended every year.  Tetanus, diphtheria, and acellular pertussis (Tdap, Td) vaccine. You may need a Td booster every 10 years.  Zoster vaccine. You may need this after age 54.  Pneumococcal 13-valent conjugate (PCV13) vaccine. One dose is recommended after age 36.  Pneumococcal polysaccharide (PPSV23) vaccine. One dose is recommended after  age 55. Talk to your health care provider about which screenings and vaccines you need and how often you need them. This information is not intended to replace advice given to you by your health care provider. Make sure you discuss any questions you have with your health care provider. Document Released: 09/11/2015 Document Revised: 05/04/2016 Document Reviewed: 06/16/2015 Elsevier Interactive Patient Education  2017 [place] Prevention in the Home Falls can cause injuries. They can happen to people of all ages. There are many things you can do to make your home safe and to help prevent falls. What can I do on the outside of my home?  Regularly fix the edges of walkways and driveways and fix any cracks.  Remove anything that might make you trip as you walk through a door, such as a raised step or threshold.  Trim any bushes or trees on the path to your home.  Use bright outdoor lighting.  Clear any walking paths of anything that might make someone trip, such as rocks or tools.  Regularly check to see if handrails are loose or broken. Make sure that both sides of any steps have handrails.  Any raised decks and porches should have guardrails on the edges.  Have any leaves, snow, or ice cleared regularly.  Use sand or salt on walking paths during winter.  Clean up any spills in your garage right away. This includes oil or grease spills. What can I do in the bathroom?  Use night lights.  Install grab bars by the toilet and in the tub and shower. Do not use towel bars as grab bars.  Use non-skid mats or decals in the tub or shower.  If you need to sit down in the shower, use a plastic, non-slip stool.  Keep the floor dry. Clean up any water that spills on the floor as soon as it happens.  Remove soap buildup in the tub or shower regularly.  Attach bath mats securely with double-sided non-slip rug tape.  Do not have throw rugs and other things on the floor that can  make you trip. What can I do in the bedroom?  Use night lights.  Make sure that you have a light by your bed that is easy to reach.  Do not use any sheets or blankets that are too big for your bed. They should not hang down onto the floor.  Have a firm chair that has side arms. You can use this for support while you get dressed.  Do not have throw rugs and other things on the floor that can make you trip. What can I do in the kitchen?  Clean up any spills right away.  Avoid walking on wet floors.  Keep items that you use a lot in easy-to-reach places.  If you need to reach something above you, use a strong step stool that has a grab bar.  Keep electrical cords out of the way.  Do not use floor polish or wax that makes floors slippery. If you must use wax, use non-skid floor wax.  Do not have throw rugs and other things on the floor that can make you trip. What can I do  with my stairs?  Do not leave any items on the stairs.  Make sure that there are handrails on both sides of the stairs and use them. Fix handrails that are broken or loose. Make sure that handrails are as long as the stairways.  Check any carpeting to make sure that it is firmly attached to the stairs. Fix any carpet that is loose or worn.  Avoid having throw rugs at the top or bottom of the stairs. If you do have throw rugs, attach them to the floor with carpet tape.  Make sure that you have a light switch at the top of the stairs and the bottom of the stairs. If you do not have them, ask someone to add them for you. What else can I do to help prevent falls?  Wear shoes that:  Do not have high heels.  Have rubber bottoms.  Are comfortable and fit you well.  Are closed at the toe. Do not wear sandals.  If you use a stepladder:  Make sure that it is fully opened. Do not climb a closed stepladder.  Make sure that both sides of the stepladder are locked into place.  Ask someone to hold it for you,  if possible.  Clearly mark and make sure that you can see:  Any grab bars or handrails.  First and last steps.  Where the edge of each step is.  Use tools that help you move around (mobility aids) if they are needed. These include:  Canes.  Walkers.  Scooters.  Crutches.  Turn on the lights when you go into a dark area. Replace any light bulbs as soon as they burn out.  Set up your furniture so you have a clear path. Avoid moving your furniture around.  If any of your floors are uneven, fix them.  If there are any pets around you, be aware of where they are.  Review your medicines with your doctor. Some medicines can make you feel dizzy. This can increase your chance of falling. Ask your doctor what other things that you can do to help prevent falls. This information is not intended to replace advice given to you by your health care provider. Make sure you discuss any questions you have with your health care provider. Document Released: 06/11/2009 Document Revised: 01/21/2016 Document Reviewed: 09/19/2014 Elsevier Interactive Patient Education  2017 [employer].

## 2019-06-26 NOTE — Progress Notes (Signed)
This service is provided via telemedicine  No vital signs collected/recorded due to the encounter was a telemedicine visit.   Location of patient (ex: home, work):  Home  Patient consents to a telephone visit:  Yes  Location of the provider (ex: office, home):  [facility] office  Name of any referring provider:  Dr. Hollace Kinnier  Names of all persons participating in the telemedicine service and their role in the encounter:  Bonney Leitz, [place]; Dinah Ngetich, NP; patient  Time spent on call:  7.49 minutes - CMA time only

## 2019-07-01 ENCOUNTER — Other Ambulatory Visit: Payer: Self-pay

## 2019-07-01 ENCOUNTER — Ambulatory Visit (INDEPENDENT_AMBULATORY_CARE_PROVIDER_SITE_OTHER): Payer: Medicare Other | Admitting: Podiatry

## 2019-07-01 VITALS — BP 151/77 | HR 76

## 2019-07-01 DIAGNOSIS — M76822 Posterior tibial tendinitis, left leg: Secondary | ICD-10-CM

## 2019-07-01 DIAGNOSIS — M76821 Posterior tibial tendinitis, right leg: Secondary | ICD-10-CM | POA: Diagnosis not present

## 2019-07-01 DIAGNOSIS — M19079 Primary osteoarthritis, unspecified ankle and foot: Secondary | ICD-10-CM | POA: Diagnosis not present

## 2019-07-02 ENCOUNTER — Other Ambulatory Visit: Payer: Self-pay | Admitting: Internal Medicine

## 2019-07-03 NOTE — Progress Notes (Signed)
   HPI: 85 y.o. female presenting today as a new patient with a chief complaint of aching pain in the bilateral feet that began a few months ago. Walking makes the pain worse. She has been taking Aleve for treatment. Patient is here for further evaluation and treatment.   Past Medical History:  Diagnosis Date  . Allergy   . Arthritis   . 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 Cataract   . Chronic anxiety   . Constipation   . Diverticulosis of sigmoid colon   . Emphysema of lung ([place])   . GERD (gastroesophageal reflux disease)   . Glaucoma   . H/O adenomatous polyp of colon    Dr. Cristina Gong  . High cholesterol   . Hypertension   . Hypothyroidism   . Insomnia   . Lactose intolerance   . Lumbar degenerative disc disease   . Onychomycosis   . Osteoarthritis   . Osteoporosis   . Second degree Mobitz II AV block    s/p PPM  . Spinal stenosis   . Thyroid disease   . Varicose veins      Physical Exam: General: The patient is alert and oriented x3 in no acute distress.  Dermatology: Skin is warm, dry and supple bilateral lower extremities. Negative for open lesions or macerations.  Vascular: Palpable pedal pulses bilaterally. No edema or erythema noted. Capillary refill within normal limits.  Neurological: Epicritic and protective threshold grossly intact bilaterally.   Musculoskeletal Exam: Pain on palpation noted to the posterior tibial tendon of the bilateral feet with medial longitudinal arch collapse noted. Range of motion within normal limits to all pedal and ankle joints bilateral. Muscle strength 5/5 in all groups bilateral.   Assessment: 1. PTTD bilateral  2. DJD bilateral    Plan of Care:  1. Patient evaluated. 2. Appointment with Liliane Channel, Pedorthist, for custom molded orthotics.  3. Continue wearing New Balance shoes.  4. Return to clinic as needed.       Edrick Kins, DPM [facility]  Dr.  Edrick Kins, DPM    [address], [place] [address]                Office [phone]  Fax [phone]

## 2019-07-09 ENCOUNTER — Other Ambulatory Visit: Payer: Self-pay

## 2019-07-09 ENCOUNTER — Ambulatory Visit (INDEPENDENT_AMBULATORY_CARE_PROVIDER_SITE_OTHER): Payer: Medicare Other | Admitting: Orthotics

## 2019-07-09 DIAGNOSIS — M76821 Posterior tibial tendinitis, right leg: Secondary | ICD-10-CM

## 2019-07-09 DIAGNOSIS — M76822 Posterior tibial tendinitis, left leg: Secondary | ICD-10-CM

## 2019-07-09 DIAGNOSIS — M19079 Primary osteoarthritis, unspecified ankle and foot: Secondary | ICD-10-CM

## 2019-07-09 NOTE — Progress Notes (Signed)
Patient presents with history of PTTD/Pes Planus (acquired).  Patient demonstrated medial shift talus/drop navicular, and medial column collapse.   Plan is for CMFO w/ longitudinal arch support, rear foot stability to address eversion.  Fabrication will be with semi rigid/rigid poly pro shell, deep heel seat, wide, padding under spenco cover. Plan on                                      To fabricate. 

## 2019-07-10 ENCOUNTER — Other Ambulatory Visit: Payer: Self-pay | Admitting: Internal Medicine

## 2019-07-10 ENCOUNTER — Telehealth: Payer: Self-pay

## 2019-07-10 ENCOUNTER — Ambulatory Visit (INDEPENDENT_AMBULATORY_CARE_PROVIDER_SITE_OTHER): Payer: Medicare Other | Admitting: *Deleted

## 2019-07-10 DIAGNOSIS — I441 Atrioventricular block, second degree: Secondary | ICD-10-CM

## 2019-07-10 LAB — CUP PACEART REMOTE DEVICE CHECK
Battery Remaining Longevity: 98 mo
Battery Voltage: 3.02 V
Brady Statistic AP VP Percent: 0.14 %
Brady Statistic AP VS Percent: 16.35 %
Brady Statistic AS VP Percent: 0.96 %
Brady Statistic AS VS Percent: 82.54 %
Brady Statistic RA Percent Paced: 16.32 %
Brady Statistic RV Percent Paced: 1.13 %
Date Time Interrogation Session: 20201111170616
Implantable Lead Implant Date: 20171219
Implantable Lead Implant Date: 20171219
Implantable Lead Location: 753859
Implantable Lead Location: 753860
Implantable Lead Model: 5076
Implantable Lead Model: 5076
Implantable Pulse Generator Implant Date: 20171219
Lead Channel Impedance Value: 304 Ohm
Lead Channel Impedance Value: 304 Ohm
Lead Channel Impedance Value: 342 Ohm
Lead Channel Impedance Value: 380 Ohm
Lead Channel Pacing Threshold Amplitude: 0.625 V
Lead Channel Pacing Threshold Amplitude: 1.25 V
Lead Channel Pacing Threshold Pulse Width: 0.4 ms
Lead Channel Pacing Threshold Pulse Width: 0.4 ms
Lead Channel Sensing Intrinsic Amplitude: 2.875 mV
Lead Channel Sensing Intrinsic Amplitude: 2.875 mV
Lead Channel Sensing Intrinsic Amplitude: 6.625 mV
Lead Channel Sensing Intrinsic Amplitude: 6.625 mV
Lead Channel Setting Pacing Amplitude: 2 V
Lead Channel Setting Pacing Amplitude: 2.5 V
Lead Channel Setting Pacing Pulse Width: 0.4 ms
Lead Channel Setting Sensing Sensitivity: 2.8 mV

## 2019-07-10 NOTE — Telephone Encounter (Signed)
The pt called stating she tried to send a remote transmission but received the error code [id number]. I gave her the number to [place] support to get additional help.

## 2019-07-11 ENCOUNTER — Telehealth: Payer: Self-pay | Admitting: Internal Medicine

## 2019-07-11 NOTE — Progress Notes (Signed)
Cardiology Office Note:    Date:  07/12/2019   ID:  Destiny [name], DOB 1927-12-05, MRN [id number]  PCP:  Gayland Curry, DO  Cardiologist:  No primary care provider on file.  Electrophysiologist:  Thompson Grayer, MD    Referring MD: Gayland Curry, DO   Chief Complaint  Patient presents with  . Shortness of Breath    History of Present Illness:    Destiny [name] is a 85 y.o. female with:   Hypertension   Hyperlipidemia   GERD  Mobitz 2   S/p Pacer  Destiny [name] was last seen by Dr. Rayann Heman in 02/2019.   She returns today for evaluation of shortness of breath.  She lives at [facility].  Over the past few weeks, she has tried to increase her activity.  She has been fairly sedentary with quarantine.  She is enrolled in a water aerobics class.  She gets short of breath walking down the hall at the main facility.  She has not had orthopnea, paroxysmal nocturnal dyspnea.  She has chronic ankle swelling.  This improves with elevation and worsens with standing.  She has not had syncope.  She has not had chest pain.     Prior CV studies:   The following studies were reviewed today:  Echocardiogram 11/12/2018 EF 50-55, no RWMA, mild to mod LAE, mod MAC, mild to mod TR  Carotid US 12/22/17 IMPRESSION: 1. Bilateral carotid bifurcation plaque resulting in less than 50% diameter ICA stenosis. 2.  Antegrade bilateral vertebral arterial flow.    Past Medical History:  Diagnosis Date  . Allergy   . Arthritis   . 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 Cataract   . Chronic anxiety   . Constipation   . Diverticulosis of sigmoid colon   . Emphysema of lung ([place])   . GERD (gastroesophageal reflux disease)   . Glaucoma   . H/O adenomatous polyp of colon    Dr. Cristina Gong  . High cholesterol   . Hypertension   . Hypothyroidism   . Insomnia   . Lactose intolerance   . Lumbar degenerative disc disease   . Onychomycosis   .  Osteoarthritis   . Osteoporosis   . Second degree Mobitz II AV block    s/p PPM  . Spinal stenosis   . Thyroid disease   . Varicose veins    Surgical Hx: The patient  has a past surgical history that includes Cholecystectomy; Joint replacement; Abdominal hysterectomy; Shoulder arthroscopy with rotator cuff repair (Right, 08/2003); Bladder repair; Laminectomy (02/2011); Incontinence surgery; Total hip arthroplasty; Colonoscopy (05/18/2012); Cataract extraction w/ intraocular lens  implant, bilateral (7 & 8 2014); blephoplasty bilateral (03/10/2014); Lumbar laminectomy/decompression microdiscectomy (Right, 09/21/2015); Tonsillectomy; herniated disc repair; Cardiac catheterization (N/A, 08/16/2016); and Eye surgery (Right, 05/02/2018).   Current Medications: Current Meds  Medication Sig  . Calcium-Phosphorus-Vitamin D (CITRACAL +D3) 250-107-500 MG-MG-UNIT CHEW Chew 2 tablets by mouth daily.   . Cyanocobalamin (VITAMIN B 12 PO) Take 1 tablet by mouth daily.  Marland Kitchen denosumab (PROLIA) 60 MG/ML SOSY injection Inject 60 mg into the skin every 6 (six) months.  . diltiazem (CARDIZEM CD) 240 MG 24 hr capsule TAKE 1 CAPSULE EVERY DAY.  Marland Kitchen dorzolamide-timolol (COSOPT) 22.3-6.8 MG/ML ophthalmic solution Place 1 drop into both eyes 2 (two) times daily.  . furosemide (LASIX) 20 MG tablet TAKE 1 TABLET IN THE MORNING.  Marland Kitchen ipratropium (ATROVENT) 0.06 % nasal spray USE  2 SPRAYS IN EACH NOSTRIL 3 TIMES A DAY AS NEEDED.  Marland Kitchen irbesartan (AVAPRO) 300 MG tablet TAKE 1 TABLET BY MOUTH DAILY.  Marland Kitchen LACTASE PO Take 1 tablet by mouth as needed (when eating dairy products). Take as directed  . LUMIGAN 0.01 % SOLN Place 1 drop into both eyes 2 (two) times daily.   . Multiple Vitamins-Minerals (MULTIVITAMIN WITH MINERALS) tablet Take 1 tablet by mouth daily.  Mckinley Jewel Dimesylate (RHOPRESSA) 0.02 % SOLN Apply 1 drop to eye at bedtime. Right eye  . Probiotic Product (ALIGN) 4 MG CAPS Take by mouth daily.  . rosuvastatin (CRESTOR) 5  MG tablet TAKE 1 TABLET ONCE DAILY FOR CHOLESTEROL.  . SYNTHROID 125 MCG tablet TAKE 1 TABLET ONCE DAILY.  Marland Kitchen UNABLE TO FIND Take by mouth. Med Name: OTC purified peppermint oil for IBS - 3 caps before lunch and dinner     Allergies:   Codeine, Macrobid [nitrofurantoin], Miralax [polyethylene glycol], Pain relief [acetaminophen], Paxil [paroxetine hcl], Percodan [oxycodone-aspirin], Oxycodone, Oxycodone-acetaminophen, and Penicillins   Social History   Tobacco Use  . Smoking status: Never Smoker  . Smokeless tobacco: Never Used  Substance Use Topics  . Alcohol use: No  . Drug use: No     Family Hx: The patient's family history includes Arthritis in her mother; Heart attack in her father; Osteoporosis in her mother.  ROS:   Please see the history of present illness.    Review of Systems  Gastrointestinal: Negative for hematochezia.  Genitourinary: Negative for hematuria.   All other systems reviewed and are negative.   EKGs/Labs/Other Test Reviewed:    EKG:  EKG is  ordered today.  The ekg ordered today demonstrates atrial paced, HR 81, leftward axis, IVCD, nonspecific ST-T wave changes, QTC 469, similar to old EKGs  Recent Labs: 05/14/2019: ALT 18; BUN 13; Creatinine 0.6; Hemoglobin 12.8; Platelets 230; Potassium 4.1; Sodium 145; TSH 2.30   Recent Lipid Panel Lab Results  Component Value Date/Time   CHOL 143 05/14/2019   TRIG 127 05/14/2019   HDL 65 05/14/2019   CHOLHDL 2.3 01/19/2017 09:37 AM   LDLCALC 53 05/14/2019    Physical Exam:    VS:  BP (!) 144/76   Pulse 81   Ht _0  (1.6 m)   Wt 138 lb 12.8 oz (63 kg)   SpO2 97%   BMI 24.59 kg/m     Wt Readings from Last 3 Encounters:  07/12/19 138 lb 12.8 oz (63 kg)  05/22/19 136 lb (61.7 kg)  03/28/19 134 lb (60.8 kg)     Physical Exam  Constitutional: She is oriented to person, place, and time. She appears well-developed and well-nourished. No distress.  HENT:  Head: Normocephalic and atraumatic.  Eyes: No  scleral icterus.  Neck: No JVD present. No thyromegaly present.  Cardiovascular: Normal rate and regular rhythm.  No murmur heard. Pulmonary/Chest: Effort normal and breath sounds normal. She has no rales.  Abdominal: Soft. There is no hepatomegaly.  Musculoskeletal:        General: Edema (1+ bilat ankle edema) present.  Lymphadenopathy:    She has no cervical adenopathy.  Neurological: She is alert and oriented to person, place, and time.  Skin: Skin is warm and dry.  Psychiatric: She has a normal mood and affect.    ASSESSMENT & PLAN:    1. Shortness of breath Etiology not entirely clear.  She has lower extremity swelling but this seems to be more related to venous insufficiency than volume  excess.  Her lungs are clear.  Her neck veins are flat.  An echocardiogram in 10/2018 demonstrated normal EF.  Diastolic function could not be assessed.  She will need further evaluation to completely rule out CHF.  She does have a history of aortic atherosclerosis.  Therefore, she is certainly at risk for coronary artery disease.  She had a stress test about 4 years ago prior to back surgery.  Given her advanced age, we would try to avoid cardiac catheterization if at all possible.  However, she is a fairly active 85 year old and would probably tolerate cardiac catheterization without difficulty.  Overall, it may be that she is deconditioned.  However, we need to rule out CHF, ischemia prior to determining this.    -Labs today: BMET, BNP, CBC  -Arrange Lexiscan Myoview  -Follow-up with me in 4 weeks via telemedicine  2. Essential hypertension Blood pressure somewhat elevated.  Continue to monitor.  3. Other hyperlipidemia LDL optimal on most recent lab work.  Continue current Rx.    4. Aortic atherosclerosis (HCC) Continue statin therapy.  5. Cardiac pacemaker in situ Continue follow-up with EP as planned.  6.  Leg swelling As noted, she seems to have venous insufficiency.  I have recommended  compression hose as well as elevation.  Continue current dose of furosemide.   Dispo:  Return in about 4 weeks (around 08/09/2019) for Routine Follow Up, w/ Richardson Dopp, PA-C, (virtual or in-person).   Medication Adjustments/Labs and Tests Ordered: Current medicines are reviewed at length with the patient today.  Concerns regarding medicines are outlined above.  Tests Ordered: Orders Placed This Encounter  Procedures  . Basic Metabolic Panel (BMET)  . CBC w/Diff  . Pro b natriuretic peptide  . Myocardial Perfusion Imaging  . EKG 12-Lead   Medication Changes: No orders of the defined types were placed in this encounter.   Signed, Richardson Dopp, PA-C  07/12/2019 1:47 PM    [facility] [place], [place], [place]  [address] Phone: [phone]; Fax: [phone]

## 2019-07-11 NOTE — Telephone Encounter (Signed)
Patient was seen by [facility] and was instructed to call our office. She has been experiencing elevated BP, fatigue, SOB, & irregular pulse. The Patient states these symptoms have been gradually growing for the last couple of weeks. Patient was offered an appointment, but she declined until she speaks to the nurse.

## 2019-07-11 NOTE — Telephone Encounter (Signed)
Pt called to report that she had a visit with Mliss Sax a nurse with [employer] that comes to [place] where she resides... weekly.   Pt says she has been feeling poorly and iot has been worsening over the past 2 weeks..   The nurse check her VS.... BP 104/60 and HR 82 and "irregular"   Pt has had SOB with exertion but relieved with rest, some bilateral foot edema but can still wear her normal shoes but they are tight, she is unsure about her weight. She c/o increased fatigue everyday about 1 pm and unable to do much after that time unless she rest for a considerable amount of time. Pt denies palpitations and dizziness.  Pt has been trying and a device transmission but was having difficulties but seesm to have fixed the problem after calling support yesterday.   I have asked her to go ahead and send one now for review by the device clinic.   Pt is asking to make an appt to be seen.. she declined a virtual visit with Dr. Rayann Heman... next available would be with Barrington Ellison 07/24/19 but would like to have Amber review her transmission to see if an appt can wait that long.. Will also forward to Dr. Rayann Heman for review.   Pt has Lasix 20mg  that she takes daily.

## 2019-07-11 NOTE — Telephone Encounter (Signed)
Transmission reviewed. Normal device function. No episodes.  Chanetta Marshall, NP 07/11/2019 1:00 PM

## 2019-07-11 NOTE — Telephone Encounter (Signed)
Pt called and I made her an appt for 07/12/19 with Richardson Dopp PA and she will also talk with her PCP office about making and appt next week with Dr. Mariea Clonts.       COVID-19 Pre-Screening Questions:  . In the past 7 to 10 days have you had a cough,  shortness of breath, headache, congestion, fever (100 or greater) body aches, chills, sore throat, or sudden loss of taste or sense of smell? NO . Have you been around anyone with known Covid 19. NO . Have you been around anyone who is awaiting Covid 19 test results in the past 7 to 10 days? NO . Have you been around anyone who has been exposed to Covid 19, or has mentioned symptoms of Covid 19 within the past 7 to 10 days? NO  If you have any concerns/questions about symptoms patients report during screening (either on the phone or at threshold). Contact the provider seeing the patient or DOD for further guidance.  If neither are available contact a member of the leadership team.

## 2019-07-12 ENCOUNTER — Encounter: Payer: Self-pay | Admitting: Physician Assistant

## 2019-07-12 ENCOUNTER — Other Ambulatory Visit: Payer: Self-pay

## 2019-07-12 ENCOUNTER — Ambulatory Visit (INDEPENDENT_AMBULATORY_CARE_PROVIDER_SITE_OTHER): Payer: Medicare Other | Admitting: Physician Assistant

## 2019-07-12 VITALS — BP 144/76 | HR 81 | Ht 63.0 in | Wt 138.8 lb

## 2019-07-12 DIAGNOSIS — M7989 Other specified soft tissue disorders: Secondary | ICD-10-CM | POA: Diagnosis not present

## 2019-07-12 DIAGNOSIS — I10 Essential (primary) hypertension: Secondary | ICD-10-CM

## 2019-07-12 DIAGNOSIS — R0602 Shortness of breath: Secondary | ICD-10-CM

## 2019-07-12 DIAGNOSIS — I700 Atherosclerosis of aorta: Secondary | ICD-10-CM | POA: Diagnosis not present

## 2019-07-12 DIAGNOSIS — Z950 Presence of cardiac pacemaker: Secondary | ICD-10-CM | POA: Diagnosis not present

## 2019-07-12 DIAGNOSIS — E7849 Other hyperlipidemia: Secondary | ICD-10-CM | POA: Diagnosis not present

## 2019-07-12 NOTE — Patient Instructions (Addendum)
Medication Instructions:  Your physician recommends that you continue on your current medications as directed. Please refer to the Current Medication list given to you today.  *If you need a refill on your cardiac medications before your next appointment, please call your pharmacy*  Lab Work: Your physician recommends that you have lab work today: BMET/CBC/BNP.  If you have labs (blood work) drawn today and your tests are completely normal, you will receive your results only by: Marland Kitchen MyChart Message (if you have MyChart) OR . A paper copy in the mail If you have any lab test that is abnormal or we need to change your treatment, we will call you to review the results.  Testing/Procedures: Your physician has requested that you have a lexiscan myoview. For further information please visit [email or web address]. Please follow instruction sheet, as given. You are scheduled for a Myocardial Perfusion Imaging Study on Wednesday, Nov 18 at 10:00 am.   Please arrive 15 minutes prior to your appointment time for registration and insurance purposes.   The test will take approximately 3 to 4 hours to complete; you may bring reading material. If someone comes with you to your appointment, they will need to remain in the main lobby due to limited space in the testing area.   If you are pregnant or breastfeeding, please notify the nuclear lab prior to your appointment.   How to prepare for your Myocardial Perfusion test:   Do not eat or drink 3 hours prior to your test, except you may have water.   Hold lasix the am of test.   Do not consume products containing caffeine (regular or decaffeinated) 12 hours prior to your test (ex: coffee, chocolate, soda, tea)   Do bring a list of your current medications with you. If not listed below, you may take your medications as normal.   Bring any held medication to your appointment, as you may be required to take it once the test is complete.   Do wear  comfortable clothes (no dresses or overalls) and walking shoes. Tennis shoes are preferred. No heels or open toed shoes.  Do not wear cologne, perfume, aftershave or lotions (deodorant is allowed).   If these instructions are not followed, you test will have to be rescheduled.   Please report to [address] for your test. If you have questions or concerns about your appointment, please call the Nuclear Lab at [phone].  If you cannot keep your appointment, please provide 24 hour notification to the Nuclear lab to avoid a possible $50 charge to your account.       Follow-Up: At [facility], you and your health needs are our priority.  As part of our continuing mission to provide you with exceptional heart care, we have created designated Provider Care Teams.  These Care Teams include your primary Cardiologist (physician) and Advanced Practice Providers (APPs -  Physician Assistants and Nurse Practitioners) who all work together to provide you with the care you need, when you need it.  Your next appointment:   4 weeks  The format for your next appointment:   Either In Person or Virtual  Provider:   Richardson Dopp, PA-C  Other Instructions Wear Knee or compression hose.

## 2019-07-13 LAB — BASIC METABOLIC PANEL
BUN/Creatinine Ratio: 34 — ABNORMAL HIGH (ref 12–28)
BUN: 20 mg/dL (ref 8–27)
CO2: 26 mmol/L (ref 20–29)
Calcium: 10.5 mg/dL — ABNORMAL HIGH (ref 8.7–10.3)
Chloride: 105 mmol/L (ref 96–106)
Creatinine, Ser: 0.59 mg/dL (ref 0.57–1.00)
GFR calc Af Amer: 95 mL/min/{1.73_m2} (ref >59)
GFR calc non Af Amer: 82 mL/min/{1.73_m2} (ref >59)
Glucose: 104 mg/dL — ABNORMAL HIGH (ref 65–99)
Potassium: 4.4 mmol/L (ref 3.5–5.2)
Sodium: 144 mmol/L (ref 134–144)

## 2019-07-13 LAB — CBC WITH DIFFERENTIAL/PLATELET
Basophils Absolute: 0.1 10*3/uL (ref 0.0–0.2)
Basos: 1 %
EOS (ABSOLUTE): 0.4 10*3/uL (ref 0.0–0.4)
Eos: 6 %
Hematocrit: 40 % (ref 34.0–46.6)
Hemoglobin: 12.8 g/dL (ref 11.1–15.9)
Immature Grans (Abs): 0 10*3/uL (ref 0.0–0.1)
Immature Granulocytes: 0 %
Lymphocytes Absolute: 1.7 10*3/uL (ref 0.7–3.1)
Lymphs: 21 %
MCH: 28.4 pg (ref 26.6–33.0)
MCHC: 32 g/dL (ref 31.5–35.7)
MCV: 89 fL (ref 79–97)
Monocytes Absolute: 0.7 10*3/uL (ref 0.1–0.9)
Monocytes: 9 %
Neutrophils Absolute: 5 10*3/uL (ref 1.4–7.0)
Neutrophils: 63 %
Platelets: 241 10*3/uL (ref 150–450)
RBC: 4.51 x10E6/uL (ref 3.77–5.28)
RDW: 14.3 % (ref 11.7–15.4)
WBC: 7.9 10*3/uL (ref 3.4–10.8)

## 2019-07-13 LAB — PRO B NATRIURETIC PEPTIDE: NT-Pro BNP: 363 pg/mL (ref 0–738)

## 2019-07-15 ENCOUNTER — Telehealth (HOSPITAL_COMMUNITY): Payer: Self-pay | Admitting: *Deleted

## 2019-07-15 NOTE — Telephone Encounter (Signed)
Patient given detailed instructions per Myocardial Perfusion Study Information Sheet for the test on 07/17/2019 at 1000. Patient notified to arrive 15 minutes early and that it is imperative to arrive on time for appointment to keep from having the test rescheduled.  If you need to cancel or reschedule your appointment, please call the office within 24 hours of your appointment. . Patient verbalized understanding.Destiny [name], [name]  No mychart

## 2019-07-17 ENCOUNTER — Encounter: Payer: Self-pay | Admitting: Physician Assistant

## 2019-07-17 ENCOUNTER — Other Ambulatory Visit: Payer: Self-pay

## 2019-07-17 ENCOUNTER — Ambulatory Visit (HOSPITAL_COMMUNITY): Payer: Medicare Other | Attending: Cardiovascular Disease

## 2019-07-17 DIAGNOSIS — R0602 Shortness of breath: Secondary | ICD-10-CM | POA: Diagnosis not present

## 2019-07-17 LAB — MYOCARDIAL PERFUSION IMAGING
LV dias vol: 77 mL (ref 46–106)
LV sys vol: 40 mL
SDS: 4
SRS: 0
SSS: 4
TID: 1.13

## 2019-07-17 MED ORDER — REGADENOSON 0.4 MG/5ML IV SOLN
0.4000 mg | Freq: Once | INTRAVENOUS | Status: AC
  Administered 2019-07-17: 0.4 mg via INTRAVENOUS

## 2019-07-17 MED ORDER — TECHNETIUM TC 99M TETROFOSMIN IV KIT
31.7000 | PACK | Freq: Once | INTRAVENOUS | Status: AC | PRN
  Administered 2019-07-17: 31.7 via INTRAVENOUS
  Filled 2019-07-17: qty 32

## 2019-07-17 MED ORDER — TECHNETIUM TC 99M TETROFOSMIN IV KIT
10.7000 | PACK | Freq: Once | INTRAVENOUS | Status: AC | PRN
  Administered 2019-07-17: 10.7 via INTRAVENOUS
  Filled 2019-07-17: qty 11

## 2019-07-24 ENCOUNTER — Encounter: Payer: Self-pay | Admitting: Internal Medicine

## 2019-07-24 ENCOUNTER — Non-Acute Institutional Stay: Payer: Medicare Other | Admitting: Internal Medicine

## 2019-07-24 ENCOUNTER — Other Ambulatory Visit: Payer: Self-pay

## 2019-07-24 VITALS — BP 138/72 | HR 78 | Temp 98.1°F | Ht 63.0 in | Wt 136.2 lb

## 2019-07-24 DIAGNOSIS — I10 Essential (primary) hypertension: Secondary | ICD-10-CM

## 2019-07-24 DIAGNOSIS — I872 Venous insufficiency (chronic) (peripheral): Secondary | ICD-10-CM | POA: Diagnosis not present

## 2019-07-24 DIAGNOSIS — M810 Age-related osteoporosis without current pathological fracture: Secondary | ICD-10-CM | POA: Diagnosis not present

## 2019-07-24 DIAGNOSIS — I5020 Unspecified systolic (congestive) heart failure: Secondary | ICD-10-CM | POA: Diagnosis not present

## 2019-07-24 NOTE — Progress Notes (Signed)
Location:  Occupational psychologist of Service:  Clinic (12)  Provider: Leor Whyte L. Mariea Clonts, D.O., C.M.D.  Code Status: DNR Goals of Care:  Advanced Directives 06/26/2019  Does Patient Have a Medical Advance Directive? Yes  Type of Advance Directive Out of facility DNR (pink MOST or yellow form)  Does patient want to make changes to medical advance directive? No - Patient declined  Copy of [place] in Chart? No - copy requested  Pre-existing out of facility DNR order (yellow form or pink MOST form) Yellow form placed in chart (order not valid for inpatient use)     Chief Complaint  Patient presents with  . Medical Management of Chronic Issues    Follow up from after seeing Cardiologist    HPI: Patient is a 85 y.o. female seen today to f/u after cardiology appt.  She had a stress test and a machine that made pictures of the heart.  She was told she did not have a blockage and her test was low risk.  Ejection fraction was mildly reduced.  We discussed that may have been what was making her fatigued.   She has not gotten her compression socks yet.  She has to go to a medical supply for the compression hose.  Right swells more than the left.  That's the side where her hip was replaced.  She starts off with skinny ankles.  No changes with her dyspnea.  She gets fatigued and short of breath at times.    Says nothing else new is going on.    BP was good.    She is staying here for Thanksgiving fortunately.  We reviewed again the billing concerns with prolia and she will be getting her injections at [facility].  Past Medical History:  Diagnosis Date  . Allergy   . Arthritis   . 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 Cataract   . Chronic anxiety   . Constipation   . Diverticulosis of sigmoid colon   . Emphysema of lung ([place])   . GERD (gastroesophageal reflux disease)   . Glaucoma   . H/O adenomatous  polyp of colon    Dr. Cristina Gong  . High cholesterol   . Hypertension   . Hypothyroidism   . Insomnia   . Lactose intolerance   . Lumbar degenerative disc disease   . Myoview    Myoview 06/2019: EF 48, normal perfusion; low risk (EF normal by echo 10/2018)  . Onychomycosis   . Osteoarthritis   . Osteoporosis   . Second degree Mobitz II AV block    s/p PPM  . Spinal stenosis   . Thyroid disease   . Varicose veins     Past Surgical History:  Procedure Laterality Date  . ABDOMINAL HYSTERECTOMY    . BLADDER REPAIR    . blephoplasty bilateral  03/10/2014  . CATARACT EXTRACTION W/ INTRAOCULAR LENS  IMPLANT, BILATERAL  7 & 8 2014  . CHOLECYSTECTOMY    . COLONOSCOPY  05/18/2012  . EP IMPLANTABLE DEVICE N/A 08/16/2016   MDT Advisa MRI conditional PPM implanted by Dr Rayann Heman for mobitz II second degree AV block  . EYE SURGERY Right 05/02/2018   laser eye surgery  . herniated disc repair    . INCONTINENCE SURGERY    . JOINT REPLACEMENT    . LAMINECTOMY  02/2011   L3-L5  Dr. Ellene Route  . LUMBAR LAMINECTOMY/DECOMPRESSION MICRODISCECTOMY  Right 09/21/2015   Procedure: Right Lumbar Three-FourMicrodiskectomy;  Surgeon: Kristeen Miss, MD;  Location: [facility];  Service: Neurosurgery;  Laterality: Right;  Right L3-4 Microdiskectomy  . SHOULDER ARTHROSCOPY WITH ROTATOR CUFF REPAIR Right 08/2003  . TONSILLECTOMY    . TOTAL HIP ARTHROPLASTY     right 1999    Allergies  Allergen Reactions  . Codeine Nausea Only  . Macrobid [Nitrofurantoin] Diarrhea  . Miralax [Polyethylene Glycol] Other (See Comments)    cramping  . Pain Relief [Acetaminophen] Nausea And Vomiting  . Paxil [Paroxetine Hcl] Nausea Only  . Percodan [Oxycodone-Aspirin] Itching  . Oxycodone Nausea Only  . Oxycodone-Acetaminophen Nausea Only  . Penicillins Rash    Has patient had a PCN reaction causing immediate rash, facial/tongue/throat swelling, SOB or lightheadedness with hypotension: YES Has patient had a PCN reaction causing  severe rash involving mucus membranes or skin necrosis: YES Has patient had a PCN reaction that required hospitalization NO Has patient had a PCN reaction occurring within the last 10 years: NO If all of the above answers are "NO", then may proceed with Cephalosporin use.    Outpatient Encounter Medications as of 07/24/2019  Medication Sig  . Calcium-Phosphorus-Vitamin D (CITRACAL +D3) 250-107-500 MG-MG-UNIT CHEW Chew 2 tablets by mouth daily.   . Cyanocobalamin (VITAMIN B 12 PO) Take 1 tablet by mouth daily.  Marland Kitchen denosumab (PROLIA) 60 MG/ML SOSY injection Inject 60 mg into the skin every 6 (six) months.  . diltiazem (CARDIZEM CD) 240 MG 24 hr capsule TAKE 1 CAPSULE EVERY DAY.  Marland Kitchen dorzolamide-timolol (COSOPT) 22.3-6.8 MG/ML ophthalmic solution Place 1 drop into both eyes 2 (two) times daily.  . furosemide (LASIX) 20 MG tablet TAKE 1 TABLET IN THE MORNING.  Marland Kitchen irbesartan (AVAPRO) 300 MG tablet TAKE 1 TABLET BY MOUTH DAILY.  Marland Kitchen LACTASE PO Take 1 tablet by mouth as needed (when eating dairy products). Take as directed  . LUMIGAN 0.01 % SOLN Place 1 drop into both eyes 2 (two) times daily.   . Multiple Vitamins-Minerals (MULTIVITAMIN WITH MINERALS) tablet Take 1 tablet by mouth daily.  Mckinley Jewel Dimesylate (RHOPRESSA) 0.02 % SOLN Apply 1 drop to eye at bedtime. Right eye  . Probiotic Product (ALIGN) 4 MG CAPS Take by mouth daily.  . rosuvastatin (CRESTOR) 5 MG tablet TAKE 1 TABLET ONCE DAILY FOR CHOLESTEROL.  . SYNTHROID 125 MCG tablet TAKE 1 TABLET ONCE DAILY.  Marland Kitchen UNABLE TO FIND Take by mouth. Med Name: OTC purified peppermint oil for IBS - 3 caps before lunch and dinner  . [DISCONTINUED] ipratropium (ATROVENT) 0.06 % nasal spray USE 2 SPRAYS IN EACH NOSTRIL 3 TIMES A DAY AS NEEDED.   No facility-administered encounter medications on file as of 07/24/2019.     Review of Systems:  Review of Systems  Constitutional: Positive for malaise/fatigue. Negative for chills and fever.  HENT: Negative  for congestion and sore throat.   Eyes: Negative for blurred vision.  Respiratory: Positive for shortness of breath. Negative for cough and wheezing.   Cardiovascular: Positive for leg swelling. Negative for chest pain and palpitations.  Gastrointestinal: Negative for abdominal pain.  Genitourinary: Negative for dysuria.  Musculoskeletal: Negative for falls.  Skin: Negative for itching and rash.  Neurological: Negative for dizziness and loss of consciousness.  Psychiatric/Behavioral: Negative for depression and memory loss. The patient is not nervous/anxious and does not have insomnia.     Health Maintenance  Topic Date Due  . PNA vac Low Risk Adult (2 of 2 -  PCV13) 06/25/2020 (Originally 08/29/2014)  . TETANUS/TDAP  06/19/2022  . INFLUENZA VACCINE  Completed  . DEXA SCAN  Completed    Physical Exam: Vitals:   07/24/19 1043  BP: 138/72  Pulse: 78  Temp: 98.1 F (36.7 C)  TempSrc: Oral  SpO2: 96%  Weight: 136 lb 3.2 oz (61.8 kg)  Height: 5\' 3"  (1.6 m)   Body mass index is 24.13 kg/m. Physical Exam Vitals signs reviewed.  Constitutional:      Appearance: Normal appearance.  HENT:     Head: Normocephalic and atraumatic.  Cardiovascular:     Rate and Rhythm: Normal rate and regular rhythm.     Pulses: Normal pulses.     Heart sounds: Normal heart sounds.  Pulmonary:     Effort: Pulmonary effort is normal.     Breath sounds: Normal breath sounds. No wheezing, rhonchi or rales.  Musculoskeletal: Normal range of motion.     Right lower leg: Edema present.     Left lower leg: Edema present.     Comments: Nonpitting edema bilateral ankles  Neurological:     General: No focal deficit present.     Mental Status: She is alert and oriented to person, place, and time.  Psychiatric:        Mood and Affect: Mood normal.     Labs reviewed: Basic Metabolic Panel: Recent Labs    11/22/18 05/14/19 07/12/19 1303  NA 145 145 144  K 4.2 4.1 4.4  CL  --   --  105  CO2  --    --  26  GLUCOSE  --   --  104*  BUN 15 13 20   CREATININE 0.7 0.6 0.59  CALCIUM  --   --  10.5*  TSH  --  2.30  --    Liver Function Tests: Recent Labs    05/14/19  AST 22  ALT 18  ALKPHOS 87   No results for input(s): LIPASE, AMYLASE in the last 8760 hours. No results for input(s): AMMONIA in the last 8760 hours. CBC: Recent Labs    05/14/19 07/12/19 1303  WBC 6.7 7.9  NEUTROABS  --  5.0  HGB 12.8 12.8  HCT 39 40.0  MCV  --  89  PLT 230 241   Lipid Panel: Recent Labs    05/14/19  CHOL 143  HDL 65  LDLCALC 53  TRIG 127   Lab Results  Component Value Date   HGBA1C 5.6 05/14/2019    Assessment/Plan 1. Systolic CHF with reduced left ventricular function, NYHA class 2 (HCC) -slight reduction noted on new echo at cardiology -may explain dyspnea on exertion and fatigue vs her baseline -she's also less active and may be becoming a bit deconditioned  2. Venous insufficiency of both lower extremities -agreed with cardio recommendation for compression hose--she is just waiting until she feels safer to leave campus to get these at medical supply  3. Essential hypertension -bp controlled with current meds  4. Senile osteoporosis -cont prolia but get at [facility] from now on--explained insurance requirements to her  Labs/tests ordered:  Cancel bmp in Jan Next appt:  10/30/2019 (rescheduled from Jan)  [place]. Wilba Mutz, D.O. [facility] [address], [place] [address] Cell Phone (Mon-Fri 8am-5pm):  [phone] On Call:  [phone] & follow prompts after 5pm & weekends Office Phone:  [phone] Office Fax:  [phone]

## 2019-07-30 DIAGNOSIS — C4441 Basal cell carcinoma of skin of scalp and neck: Secondary | ICD-10-CM | POA: Diagnosis not present

## 2019-07-31 ENCOUNTER — Other Ambulatory Visit: Payer: Self-pay | Admitting: Internal Medicine

## 2019-08-01 NOTE — Progress Notes (Signed)
Remote pacemaker transmission.   

## 2019-08-05 DIAGNOSIS — H401113 Primary open-angle glaucoma, right eye, severe stage: Secondary | ICD-10-CM | POA: Diagnosis not present

## 2019-08-06 ENCOUNTER — Ambulatory Visit (INDEPENDENT_AMBULATORY_CARE_PROVIDER_SITE_OTHER): Payer: Medicare Other | Admitting: Orthotics

## 2019-08-06 ENCOUNTER — Other Ambulatory Visit: Payer: Self-pay

## 2019-08-06 DIAGNOSIS — M76821 Posterior tibial tendinitis, right leg: Secondary | ICD-10-CM

## 2019-08-06 DIAGNOSIS — M76822 Posterior tibial tendinitis, left leg: Secondary | ICD-10-CM

## 2019-08-06 NOTE — Progress Notes (Signed)
Patient came in today to pick up custom made foot orthotics.  The goals were accomplished and the patient reported no dissatisfaction with said orthotics.  Patient was advised of breakin period and how to report any issues. 

## 2019-08-16 ENCOUNTER — Telehealth: Payer: Medicare Other | Admitting: Physician Assistant

## 2019-09-02 ENCOUNTER — Other Ambulatory Visit: Payer: Self-pay | Admitting: Internal Medicine

## 2019-09-05 NOTE — Progress Notes (Signed)
Virtual Visit via Telephone Note   This visit type was conducted due to national recommendations for restrictions regarding the COVID-19 Pandemic (e.g. social distancing) in an effort to limit this patient's exposure and mitigate transmission in our community.  Due to her co-morbid illnesses, this patient is at least at moderate risk for complications without adequate follow up.  This format is felt to be most appropriate for this patient at this time.  The patient did not have access to video technology/had technical difficulties with video requiring transitioning to audio format only (telephone).  All issues noted in this document were discussed and addressed.  No physical exam could be performed with this format.  Please refer to the patient's chart for her  consent to telehealth for [facility]   Date:  09/06/2019   ID:  Destiny [name], DOB [birth year], MRN [id number]  Patient Location: Home Provider Location: Home  PCP:  Gayland Curry, DO  Cardiologist:  No primary care provider on file.   Electrophysiologist:  Thompson Grayer, MD   Evaluation Performed:  Follow-Up Visit  Chief Complaint:  Shortness of breath  History of Present Illness:    Destiny [name] is a 86 y.o. female with    Hypertension   Hyperlipidemia   GERD  Mobitz 2  ? S/p Pacer  Aortic atherosclerosis (CXR in 2018)  Myoview 06/2019: no ischemia  Echocardiogram 10/2018: EF 50-55  She was last seen in 06/2019.  She complained of shortness of breath.  I obtained a Myoview and this was low risk.  A BNP was normal.    Today, she notes continued issues with shortness of breath.  However, this seems to be mainly related to wearing a mask.  When she is active in her apartment, she does not really feel significantly short of breath.  She has not had orthopnea, paroxysmal nocturnal dyspnea.  She has chronic lower extremity swelling.  The right is always worse than the left.  There has been no significant change.   She has not had significant weight change.  Since last seen, she did have follow-up with primary care who reviewed her stress test.  She is concerned about the systolic dysfunction noted on her stress test.    Past Medical History:  Diagnosis Date  . Allergy   . Arthritis   . 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 Cataract   . Chronic anxiety   . Constipation   . Diverticulosis of sigmoid colon   . Emphysema of lung ([place])   . GERD (gastroesophageal reflux disease)   . Glaucoma   . H/O adenomatous polyp of colon    Dr. Cristina Gong  . High cholesterol   . Hypertension   . Hypothyroidism   . Insomnia   . Lactose intolerance   . Lumbar degenerative disc disease   . Myoview    Myoview 06/2019: EF 48, normal perfusion; low risk (EF normal by echo 10/2018)  . Onychomycosis   . Osteoarthritis   . Osteoporosis   . Second degree Mobitz II AV block    s/p PPM  . Spinal stenosis   . Thyroid disease   . Varicose veins    Past Surgical History:  Procedure Laterality Date  . ABDOMINAL HYSTERECTOMY    . BLADDER REPAIR    . blephoplasty bilateral  03/10/2014  . CATARACT EXTRACTION W/ INTRAOCULAR LENS  IMPLANT, BILATERAL  7 & 8 2014  . CHOLECYSTECTOMY    .  COLONOSCOPY  05/18/2012  . EP IMPLANTABLE DEVICE N/A 08/16/2016   MDT Advisa MRI conditional PPM implanted by Dr Rayann Heman for mobitz II second degree AV block  . EYE SURGERY Right 05/02/2018   laser eye surgery  . herniated disc repair    . INCONTINENCE SURGERY    . JOINT REPLACEMENT    . LAMINECTOMY  02/2011   L3-L5  Dr. Ellene Route  . LUMBAR LAMINECTOMY/DECOMPRESSION MICRODISCECTOMY Right 09/21/2015   Procedure: Right Lumbar Three-FourMicrodiskectomy;  Surgeon: Kristeen Miss, MD;  Location: [facility];  Service: Neurosurgery;  Laterality: Right;  Right L3-4 Microdiskectomy  . SHOULDER ARTHROSCOPY WITH ROTATOR CUFF REPAIR Right 08/2003  . TONSILLECTOMY    . TOTAL HIP ARTHROPLASTY      right 1999     Current Meds  Medication Sig  . Calcium-Phosphorus-Vitamin D (CITRACAL +D3) 250-107-500 MG-MG-UNIT CHEW Chew 2 tablets by mouth daily.   . Cyanocobalamin (VITAMIN B 12 PO) Take 1 tablet by mouth daily.  Marland Kitchen denosumab (PROLIA) 60 MG/ML SOSY injection Inject 60 mg into the skin every 6 (six) months.  . diltiazem (CARDIZEM CD) 240 MG 24 hr capsule TAKE 1 CAPSULE EVERY DAY.  Marland Kitchen dorzolamide-timolol (COSOPT) 22.3-6.8 MG/ML ophthalmic solution Place 1 drop into both eyes 2 (two) times daily.  . furosemide (LASIX) 20 MG tablet TAKE 1 TABLET IN THE MORNING.  Marland Kitchen ipratropium (ATROVENT) 0.06 % nasal spray USE 2 SPRAYS IN EACH NOSTRIL 3 TIMES A DAY AS NEEDED.  Marland Kitchen irbesartan (AVAPRO) 300 MG tablet TAKE 1 TABLET BY MOUTH DAILY.  Marland Kitchen LACTASE PO Take 1 tablet by mouth as needed (when eating dairy products). Take as directed  . LUMIGAN 0.01 % SOLN Place 1 drop into both eyes 2 (two) times daily.   . Multiple Vitamins-Minerals (MULTIVITAMIN WITH MINERALS) tablet Take 1 tablet by mouth daily.  Mckinley Jewel Dimesylate (RHOPRESSA) 0.02 % SOLN Apply 1 drop to eye at bedtime. Right eye  . Probiotic Product (ALIGN) 4 MG CAPS Take by mouth daily.  . rosuvastatin (CRESTOR) 5 MG tablet TAKE 1 TABLET ONCE DAILY FOR CHOLESTEROL.  . SYNTHROID 125 MCG tablet TAKE 1 TABLET ONCE DAILY.  Marland Kitchen UNABLE TO FIND Take by mouth. Med Name: OTC purified peppermint oil for IBS - 3 caps before lunch and dinner     Allergies:   Codeine, Macrobid [nitrofurantoin], Miralax [polyethylene glycol], Pain relief [acetaminophen], Paxil [paroxetine hcl], Percodan [oxycodone-aspirin], Oxycodone, Oxycodone-acetaminophen, and Penicillins   Social History   Tobacco Use  . Smoking status: Never Smoker  . Smokeless tobacco: Never Used  Substance Use Topics  . Alcohol use: No  . Drug use: No     Family Hx: The patient's family history includes Arthritis in her mother; Heart attack in her father; Osteoporosis in her mother.  ROS:     Please see the history of present illness.     All other systems reviewed and are negative.   Prior CV studies:   The following studies were reviewed today:  Myoview 07/17/2019  The left ventricular ejection fraction is mildly decreased (45-54%).  Nuclear stress EF: 48%.  There was no ST segment deviation noted during stress.  No T wave inversion was noted during stress.  The study is normal. Low risk stress nuclear study with normal perfusion and mildly reduced left ventricular global systolic function.  Echocardiogram 11/12/2018 EF 50-55, no RWMA, mild to mod LAE, mod MAC, mild to mod TR  Carotid US 12/22/17 IMPRESSION: 1. Bilateral carotid bifurcation plaque resulting in less than 50%  diameter ICA stenosis. 2. Antegrade bilateral vertebral arterial flow.   Labs/Other Tests and Data Reviewed:    EKG:  No ECG reviewed.  Recent Labs: 05/14/2019: ALT 18; TSH 2.30 07/12/2019: BUN 20; Creatinine, Ser 0.59; Hemoglobin 12.8; NT-Pro BNP 363; Platelets 241; Potassium 4.4; Sodium 144   Recent Lipid Panel Lab Results  Component Value Date/Time   CHOL 143 05/14/2019 12:00 AM   TRIG 127 05/14/2019 12:00 AM   HDL 65 05/14/2019 12:00 AM   CHOLHDL 2.3 01/19/2017 09:37 AM   LDLCALC 53 05/14/2019 12:00 AM    Wt Readings from Last 3 Encounters:  09/06/19 133 lb (60.3 kg)  07/24/19 136 lb 3.2 oz (61.8 kg)  07/17/19 138 lb (62.6 kg)     Objective:    Vital Signs:  Ht _0  (1.6 m)   Wt 133 lb (60.3 kg)   BMI 23.56 kg/m    VITAL SIGNS:  reviewed GEN:  no acute distress RESPIRATORY:  No labored breathing NEURO:  Alert and oriented PSYCH:  normal affect  ASSESSMENT & PLAN:    1. Shortness of breath Overall, I suspect that her shortness of breath is related to deconditioning.  She has not been as active since restrictions began for COVID-19.  She was concerned about LV dysfunction on her stress test.  We discussed her nuclear stress test further.  I explained to her  that the ejection fraction is often underestimated on nuclear stress tests and we often use an echocardiogram to confirm ejection fraction.  Since she had a normal ejection fraction on echocardiogram in March 2020, I had not planned on repeating an echocardiogram.  However, given her ongoing concern and continued shortness of breath, I have recommended that we go ahead and proceed with a follow-up echocardiogram to ensure that her LV function has remained normal.  She may still have some diastolic dysfunction.  This was evident on an echocardiogram in 2017.  However, she had a normal BNP recently.  She has had no weight gain or other clinical signs of decompensated heart failure.  At this point, I do not think she needs her furosemide adjusted.  I reassured her today.  -Arrange 2D echocardiogram  2. Essential hypertension She was unable to obtain her blood pressure today.  She has this checked often with the nurse at [facility].  3. Aortic atherosclerosis (HCC) Continue statin therapy.  4. Cardiac pacemaker in situ Follow-up with EP as planned.   Time:   Today, I have spent 25 minutes with the patient with telehealth technology discussing the above problems.     Medication Adjustments/Labs and Tests Ordered: Current medicines are reviewed at length with the patient today.  Concerns regarding medicines are outlined above.   Tests Ordered: Orders Placed This Encounter  Procedures  . ECHOCARDIOGRAM COMPLETE    Medication Changes: No orders of the defined types were placed in this encounter.   Follow Up:  In Person July 2021 with Dr. Rayann Heman  Signed, Richardson Dopp, PA-C  09/06/2019 9:50 AM    [place]

## 2019-09-06 ENCOUNTER — Telehealth (INDEPENDENT_AMBULATORY_CARE_PROVIDER_SITE_OTHER): Payer: Medicare Other | Admitting: Physician Assistant

## 2019-09-06 ENCOUNTER — Other Ambulatory Visit: Payer: Self-pay

## 2019-09-06 ENCOUNTER — Telehealth: Payer: Self-pay | Admitting: *Deleted

## 2019-09-06 VITALS — Ht 63.0 in | Wt 133.0 lb

## 2019-09-06 DIAGNOSIS — R0602 Shortness of breath: Secondary | ICD-10-CM | POA: Diagnosis not present

## 2019-09-06 DIAGNOSIS — I700 Atherosclerosis of aorta: Secondary | ICD-10-CM

## 2019-09-06 DIAGNOSIS — Z950 Presence of cardiac pacemaker: Secondary | ICD-10-CM

## 2019-09-06 DIAGNOSIS — I10 Essential (primary) hypertension: Secondary | ICD-10-CM

## 2019-09-06 NOTE — Telephone Encounter (Signed)
Virtual Visit Pre-Appointment Phone Call  "(Name), I am calling you today to discuss your upcoming appointment. We are currently trying to limit exposure to the virus that causes COVID-19 by seeing patients at home rather than in the office."  1. "What is the BEST phone number to call the day of the visit?" - include this in appointment [phone]  2. "Do you have or have access to (through a family member/friend) a smartphone with video capability that we can use for your visit?" no a. If yes - list this number in appt notes as "cell" (if different from BEST phone #) and list the appointment type as a VIDEO visit in appointment notes b. If no - list the appointment type as a PHONE visit in appointment notes  3. Confirm consent - "In the setting of the current Covid19 crisis, you are scheduled for a phone   visit with your provider on September 06, 2019.  Just as we do with many in-office visits, in order for you to participate in this visit, we must obtain consent.  If you'd like, I can send this to your mychart (if signed up) or email for you to review.  Otherwise, I can obtain your verbal consent now.  All virtual visits are billed to your insurance company just like a normal visit would be.  By agreeing to a virtual visit, we'd like you to understand that the technology does not allow for your provider to perform an examination, and thus may limit your provider's ability to fully assess your condition. If your provider identifies any concerns that need to be evaluated in person, we will make arrangements to do so.  Finally, though the technology is pretty good, we cannot assure that it will always work on either your or our end, and in the setting of a video visit, we may have to convert it to a phone-only visit.  In either situation, we cannot ensure that we have a secure connection.  Are you willing to proceed?" STAFF: Did the patient verbally acknowledge consent to telehealth visit?  Document YES/NO here: yes  4. Advise patient to be prepared - "Two hours prior to your appointment, go ahead and check your blood pressure, pulse, oxygen saturation, and your weight (if you have the equipment to check those) and write them all down. When your visit starts, your provider will ask you for this information. If you have an Apple Watch or Kardia device, please plan to have heart rate information ready on the day of your appointment. Please have a pen and paper handy nearby the day of the visit as well."  5. Give patient instructions for MyChart download to smartphone OR Doximity/Doxy.me as below if video visit (depending on what platform provider is using)  6. Inform patient they will receive a phone call 15 minutes prior to their appointment time (may be from unknown caller ID) so they should be prepared to answer    TELEPHONE CALL NOTE  Destiny [name] has been deemed a candidate for a follow-up tele-health visit to limit community exposure during the Covid-19 pandemic. I spoke with the patient via phone to ensure availability of phone/video source, confirm preferred email & phone number, and discuss instructions and expectations.  I reminded Destiny [name] to be prepared with any vital sign and/or heart rhythm information that could potentially be obtained via home monitoring, at the time of her visit. I reminded Destiny [name] to expect a phone call prior  to her visit.  Leodis Liverpool, RN 09/06/2019 8:57 AM   INSTRUCTIONS FOR DOWNLOADING THE MYCHART APP TO SMARTPHONE  - The patient must first make sure to have activated MyChart and know their login information - If Apple, go to [employer] and type in MyChart in the search bar and download the app. If Android, ask patient to go to [employer] and type in [place] in the search bar and download the app. The app is free but as with any other app downloads, their phone may require them to verify saved payment information or  Apple/Android password.  - The patient will need to then log into the app with their MyChart username and password, and select [facility] as their healthcare provider to link the account. When it is time for your visit, go to the MyChart app, find appointments, and click Begin Video Visit. Be sure to Select Allow for your device to access the Microphone and Camera for your visit. You will then be connected, and your provider will be with you shortly.  **If they have any issues connecting, or need assistance please contact MyChart service desk [phone])**  **If using a computer, in order to ensure the best quality for their visit they will need to use either of the following Internet Browsers: [employer], or Google Chrome**  IF USING DOXIMITY or DOXY.ME - The patient will receive a link just prior to their visit by text.     FULL LENGTH CONSENT FOR TELE-HEALTH VISIT   I hereby voluntarily request, consent and authorize [facility] and its employed or contracted physicians, physician assistants, nurse practitioners or other licensed health care professionals (the Practitioner), to provide me with telemedicine health care services (the "Services") as deemed necessary by the treating Practitioner. I acknowledge and consent to receive the Services by the Practitioner via telemedicine. I understand that the telemedicine visit will involve communicating with the Practitioner through live audiovisual communication technology and the disclosure of certain medical information by electronic transmission. I acknowledge that I have been given the opportunity to request an in-person assessment or other available alternative prior to the telemedicine visit and am voluntarily participating in the telemedicine visit.  I understand that I have the right to withhold or withdraw my consent to the use of telemedicine in the course of my care at any time, without affecting my right to future care  or treatment, and that the Practitioner or I may terminate the telemedicine visit at any time. I understand that I have the right to inspect all information obtained and/or recorded in the course of the telemedicine visit and may receive copies of available information for a reasonable fee.  I understand that some of the potential risks of receiving the Services via telemedicine include:  Marland Kitchen Delay or interruption in medical evaluation due to technological equipment failure or disruption; . Information transmitted may not be sufficient (e.g. poor resolution of images) to allow for appropriate medical decision making by the Practitioner; and/or  . In rare instances, security protocols could fail, causing a breach of personal health information.  Furthermore, I acknowledge that it is my responsibility to provide information about my medical history, conditions and care that is complete and accurate to the best of my ability. I acknowledge that Practitioner's advice, recommendations, and/or decision may be based on factors not within their control, such as incomplete or inaccurate data provided by me or distortions of diagnostic images or specimens that may result from electronic transmissions.  I understand that the practice of medicine is not an exact science and that Practitioner makes no warranties or guarantees regarding treatment outcomes. I acknowledge that I will receive a copy of this consent concurrently upon execution via email to the email address I last provided but may also request a printed copy by calling the office of [facility].    I understand that my insurance will be billed for this visit.   I have read or had this consent read to me. . I understand the contents of this consent, which adequately explains the benefits and risks of the Services being provided via telemedicine.  . I have been provided ample opportunity to ask questions regarding this consent and the Services and have had  my questions answered to my satisfaction. . I give my informed consent for the services to be provided through the use of telemedicine in my medical care  By participating in this telemedicine visit I agree to the above.

## 2019-09-06 NOTE — Patient Instructions (Signed)
Medication Instructions:  none *If you need a refill on your cardiac medications before your next appointment, please call your pharmacy*  Lab Work: none If you have labs (blood work) drawn today and your tests are completely normal, you will receive your results only by: Marland Kitchen MyChart Message (if you have MyChart) OR . A paper copy in the mail If you have any lab test that is abnormal or we need to change your treatment, we will call you to review the results.  Testing/Procedures: Your physician has requested that you have an echocardiogram. Echocardiography is a painless test that uses sound waves to create images of your heart. It provides your doctor with information about the size and shape of your heart and how well your heart's chambers and valves are working. This procedure takes approximately one hour. There are no restrictions for this procedure. Our office will contact you to schedule this  Follow-Up: At [facility], you and your health needs are our priority.  As part of our continuing mission to provide you with exceptional heart care, we have created designated Provider Care Teams.  These Care Teams include your primary Cardiologist (physician) and Advanced Practice Providers (APPs -  Physician Assistants and Nurse Practitioners) who all work together to provide you with the care you need, when you need it.  Your next appointment:   6 month(s)--July 2021  The format for your next appointment:   In Person  Provider:   Dr Rayann Heman  Other Instructions

## 2019-09-11 ENCOUNTER — Ambulatory Visit: Payer: Self-pay

## 2019-09-11 DIAGNOSIS — Z23 Encounter for immunization: Secondary | ICD-10-CM | POA: Diagnosis not present

## 2019-09-18 ENCOUNTER — Other Ambulatory Visit: Payer: Self-pay

## 2019-09-18 ENCOUNTER — Ambulatory Visit (INDEPENDENT_AMBULATORY_CARE_PROVIDER_SITE_OTHER): Payer: Medicare Other

## 2019-09-18 DIAGNOSIS — M810 Age-related osteoporosis without current pathological fracture: Secondary | ICD-10-CM

## 2019-09-18 MED ORDER — DENOSUMAB 60 MG/ML ~~LOC~~ SOSY
60.0000 mg | PREFILLED_SYRINGE | Freq: Once | SUBCUTANEOUS | Status: AC
  Administered 2019-09-18: 60 mg via SUBCUTANEOUS

## 2019-09-19 ENCOUNTER — Ambulatory Visit (HOSPITAL_COMMUNITY): Payer: Medicare Other | Attending: Internal Medicine

## 2019-09-19 DIAGNOSIS — R0602 Shortness of breath: Secondary | ICD-10-CM

## 2019-09-20 ENCOUNTER — Telehealth: Payer: Self-pay

## 2019-09-20 ENCOUNTER — Telehealth: Payer: Self-pay | Admitting: *Deleted

## 2019-09-20 ENCOUNTER — Encounter: Payer: Self-pay | Admitting: Physician Assistant

## 2019-09-20 NOTE — Telephone Encounter (Signed)
[facility] Nurse called and stated that patient has an appointment for 10/30/19. Had blood work scheduled for today for BMP. Nurse is wanting to know if it would be ok to wait alittle closer to the appointment to draw the labs. Please Advise.

## 2019-09-20 NOTE — Telephone Encounter (Signed)
-----   Message from Liliane Shi, [place] sent at 09/20/2019  1:44 PM EST ----- Please call the patient. The echocardiogram shows normal heart function (ejection fraction).  There is mild stiffness or diastolic dysfunction.  There is no significant valvular disease.  There is no significant change when compared to prior echocardiogram in March 2020. PLAN:   - Continue current medications.  No further testing needed.  - Send copy to PCP Richardson Dopp, PA-C    09/20/2019 1:39 PM

## 2019-09-20 NOTE — Telephone Encounter (Signed)
lpmtcb 12/2 

## 2019-09-20 NOTE — Telephone Encounter (Signed)
[facility] Nurse Notified and agreed.  

## 2019-09-20 NOTE — Telephone Encounter (Signed)
Her lab today was supposed to have been canceled.  She also just had labs with cardiology recently.  No new needed at this point.

## 2019-09-23 ENCOUNTER — Telehealth: Payer: Self-pay

## 2019-09-23 NOTE — Telephone Encounter (Signed)
The patient has been notified of the Echo result and verbalized understanding.  All questions (if any) were answered. Frederik Schmidt, RN 09/23/2019 9:51 AM

## 2019-09-23 NOTE — Telephone Encounter (Signed)
-----   Message from Liliane Shi, [place] sent at 09/20/2019  1:44 PM EST ----- Please call the patient. The echocardiogram shows normal heart function (ejection fraction).  There is mild stiffness or diastolic dysfunction.  There is no significant valvular disease.  There is no significant change when compared to prior echocardiogram in March 2020. PLAN:   - Continue current medications.  No further testing needed.  - Send copy to PCP Richardson Dopp, PA-C    09/20/2019 1:39 PM

## 2019-09-25 ENCOUNTER — Encounter: Payer: Self-pay | Admitting: Internal Medicine

## 2019-09-25 DIAGNOSIS — M545 Low back pain: Secondary | ICD-10-CM | POA: Diagnosis not present

## 2019-09-25 DIAGNOSIS — Z96641 Presence of right artificial hip joint: Secondary | ICD-10-CM | POA: Diagnosis not present

## 2019-09-25 DIAGNOSIS — M25551 Pain in right hip: Secondary | ICD-10-CM | POA: Diagnosis not present

## 2019-09-26 ENCOUNTER — Other Ambulatory Visit: Payer: Self-pay | Admitting: Physician Assistant

## 2019-09-26 ENCOUNTER — Ambulatory Visit
Admission: RE | Admit: 2019-09-26 | Discharge: 2019-09-26 | Disposition: A | Discharge status: Home / Self Care | Payer: Medicare Other | Source: Ambulatory Visit | Attending: Orthopedic Surgery | Admitting: Orthopedic Surgery

## 2019-09-26 ENCOUNTER — Other Ambulatory Visit: Payer: Self-pay | Admitting: Orthopedic Surgery

## 2019-09-26 DIAGNOSIS — M545 Low back pain, unspecified: Secondary | ICD-10-CM

## 2019-09-27 DIAGNOSIS — M545 Low back pain: Secondary | ICD-10-CM | POA: Diagnosis not present

## 2019-09-27 DIAGNOSIS — Z471 Aftercare following joint replacement surgery: Secondary | ICD-10-CM | POA: Diagnosis not present

## 2019-09-27 DIAGNOSIS — Z96641 Presence of right artificial hip joint: Secondary | ICD-10-CM | POA: Diagnosis not present

## 2019-10-02 DIAGNOSIS — S32030A Wedge compression fracture of third lumbar vertebra, initial encounter for closed fracture: Secondary | ICD-10-CM | POA: Diagnosis not present

## 2019-10-03 ENCOUNTER — Other Ambulatory Visit: Payer: Self-pay | Admitting: Internal Medicine

## 2019-10-03 DIAGNOSIS — M545 Low back pain: Secondary | ICD-10-CM | POA: Diagnosis not present

## 2019-10-03 DIAGNOSIS — Y929 Unspecified place or not applicable: Secondary | ICD-10-CM | POA: Diagnosis not present

## 2019-10-03 DIAGNOSIS — X58XXXA Exposure to other specified factors, initial encounter: Secondary | ICD-10-CM | POA: Diagnosis not present

## 2019-10-03 DIAGNOSIS — Y939 Activity, unspecified: Secondary | ICD-10-CM | POA: Diagnosis not present

## 2019-10-03 DIAGNOSIS — S32030G Wedge compression fracture of third lumbar vertebra, subsequent encounter for fracture with delayed healing: Secondary | ICD-10-CM | POA: Diagnosis not present

## 2019-10-03 DIAGNOSIS — S32030A Wedge compression fracture of third lumbar vertebra, initial encounter for closed fracture: Secondary | ICD-10-CM | POA: Diagnosis not present

## 2019-10-03 DIAGNOSIS — Y999 Unspecified external cause status: Secondary | ICD-10-CM | POA: Diagnosis not present

## 2019-10-08 DIAGNOSIS — Z23 Encounter for immunization: Secondary | ICD-10-CM | POA: Diagnosis not present

## 2019-10-09 ENCOUNTER — Ambulatory Visit (INDEPENDENT_AMBULATORY_CARE_PROVIDER_SITE_OTHER): Payer: Medicare Other | Admitting: *Deleted

## 2019-10-09 DIAGNOSIS — I441 Atrioventricular block, second degree: Secondary | ICD-10-CM

## 2019-10-09 LAB — CUP PACEART REMOTE DEVICE CHECK
Battery Remaining Longevity: 87 mo
Battery Voltage: 3.01 V
Brady Statistic AP VP Percent: 1.17 %
Brady Statistic AP VS Percent: 13.61 %
Brady Statistic AS VP Percent: 10.37 %
Brady Statistic AS VS Percent: 74.85 %
Brady Statistic RA Percent Paced: 14.56 %
Brady Statistic RV Percent Paced: 11.57 %
Date Time Interrogation Session: 20210210133734
Implantable Lead Implant Date: 20171219
Implantable Lead Implant Date: 20171219
Implantable Lead Location: 753859
Implantable Lead Location: 753860
Implantable Lead Model: 5076
Implantable Lead Model: 5076
Implantable Pulse Generator Implant Date: 20171219
Lead Channel Impedance Value: 304 Ohm
Lead Channel Impedance Value: 323 Ohm
Lead Channel Impedance Value: 342 Ohm
Lead Channel Impedance Value: 399 Ohm
Lead Channel Pacing Threshold Amplitude: 0.625 V
Lead Channel Pacing Threshold Amplitude: 1.125 V
Lead Channel Pacing Threshold Pulse Width: 0.4 ms
Lead Channel Pacing Threshold Pulse Width: 0.4 ms
Lead Channel Sensing Intrinsic Amplitude: 2.875 mV
Lead Channel Sensing Intrinsic Amplitude: 2.875 mV
Lead Channel Sensing Intrinsic Amplitude: 5.375 mV
Lead Channel Sensing Intrinsic Amplitude: 5.375 mV
Lead Channel Setting Pacing Amplitude: 2 V
Lead Channel Setting Pacing Amplitude: 2.5 V
Lead Channel Setting Pacing Pulse Width: 0.4 ms
Lead Channel Setting Sensing Sensitivity: 2.8 mV

## 2019-10-09 NOTE — Progress Notes (Signed)
PPM Remote  

## 2019-10-23 DIAGNOSIS — I10 Essential (primary) hypertension: Secondary | ICD-10-CM | POA: Diagnosis not present

## 2019-10-23 DIAGNOSIS — S32030A Wedge compression fracture of third lumbar vertebra, initial encounter for closed fracture: Secondary | ICD-10-CM | POA: Diagnosis not present

## 2019-10-30 ENCOUNTER — Encounter: Payer: Self-pay | Admitting: Internal Medicine

## 2019-10-30 ENCOUNTER — Non-Acute Institutional Stay: Payer: Medicare Other | Admitting: Internal Medicine

## 2019-10-30 ENCOUNTER — Other Ambulatory Visit: Payer: Self-pay

## 2019-10-30 VITALS — BP 128/32 | HR 70 | Temp 97.7°F | Ht 63.0 in | Wt 133.0 lb

## 2019-10-30 DIAGNOSIS — R2689 Other abnormalities of gait and mobility: Secondary | ICD-10-CM | POA: Diagnosis not present

## 2019-10-30 DIAGNOSIS — M810 Age-related osteoporosis without current pathological fracture: Secondary | ICD-10-CM | POA: Diagnosis not present

## 2019-10-30 DIAGNOSIS — I872 Venous insufficiency (chronic) (peripheral): Secondary | ICD-10-CM | POA: Diagnosis not present

## 2019-10-30 DIAGNOSIS — M419 Scoliosis, unspecified: Secondary | ICD-10-CM | POA: Diagnosis not present

## 2019-10-30 DIAGNOSIS — S32030A Wedge compression fracture of third lumbar vertebra, initial encounter for closed fracture: Secondary | ICD-10-CM

## 2019-10-30 NOTE — Progress Notes (Signed)
Location:   [facility]   Place of Service:  Clinic (12)  Provider: Kimiah Hibner L. Mariea Clonts, D.O., C.M.D.  Code Status: DNR Goals of Care:  Advanced Directives 10/30/2019  Does Patient Have a Medical Advance Directive? Yes  Type of Advance Directive Out of facility DNR (pink MOST or yellow form)  Does patient want to make changes to medical advance directive? No - Patient declined  Copy of [place] in Chart? -  Pre-existing out of facility DNR order (yellow form or pink MOST form) -     Chief Complaint  Patient presents with  . Medical Management of Chronic Issues    4 month follow up    HPI: Patient is a 86 y.o. female seen today for medical management of chronic diseases.    She was incidentally found to have a compression fracture when seeing ortho about her right hip pain--she thought her right hip arthroplasty cup was failing.  Saw Dr. Anne Fu PA and hip turned out to be fine, but the compression fx was there (mild L3 on CT).  Had kyphoplasty done.  She still has pain across the middle of her lower back into the right hip/buttock.  It's a little better than it was.  She takes an aleve if she doesn't forget it.  Hurts if she moves about not sitting.  She has also developed scoliosis over the past few years that was not on her imaging with Dr. Ellene Route in 2011, but is there now.    Osteoporosis:  She just had her last prolia 09/18/19 so next would be due July.  Has now had compression fx despite this and T score had been -1 in lumbar spine.  She is upping her vitamin D3 intake by adding a 2000 unit supplement to her citracal with D3 2 tabs which has a total of 1000 units.  She also takes a centrum silver MVI with a small amount of D3.  Calcium levels have been satisfactory.  We discussed trying evenity injections monthly for a year to build her bone of her spine.    Using compression hose now for her venous insufficiency--right leg swells worse than left.  Says people think  she's going to fall when carrying her dinner and using her cane recently.  She has not been exercising as much due to her pain.      Past Medical History:  Diagnosis Date  . Allergy   . Arthritis   . 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 Cataract   . Chronic anxiety   . Constipation   . Diverticulosis of sigmoid colon   . Emphysema of lung ([place])   . GERD (gastroesophageal reflux disease)   . Glaucoma   . H/O adenomatous polyp of colon    Dr. Cristina Gong  . High cholesterol   . HTN (hypertension) 07/17/2015   Echo 08/2019: EF 72-62, grade 1 diastolic dysfunction, mild apical HK, normal RV SF, trivial pericardial effusion, moderate MAC, mild MR, mild TR  . Hypothyroidism   . Insomnia   . Lactose intolerance   . Lumbar degenerative disc disease   . Myoview    Myoview 06/2019: EF 48, normal perfusion; low risk (EF normal by echo 10/2018)  . Onychomycosis   . Osteoarthritis   . Osteoporosis   . Second degree Mobitz II AV block    s/p PPM  . Spinal stenosis   . Thyroid disease   .  Varicose veins     Past Surgical History:  Procedure Laterality Date  . ABDOMINAL HYSTERECTOMY    . BLADDER REPAIR    . blephoplasty bilateral  03/10/2014  . CATARACT EXTRACTION W/ INTRAOCULAR LENS  IMPLANT, BILATERAL  7 & 8 2014  . CHOLECYSTECTOMY    . COLONOSCOPY  05/18/2012  . EP IMPLANTABLE DEVICE N/A 08/16/2016   MDT Advisa MRI conditional PPM implanted by Dr Rayann Heman for mobitz II second degree AV block  . EYE SURGERY Right 05/02/2018   laser eye surgery  . herniated disc repair    . INCONTINENCE SURGERY    . JOINT REPLACEMENT    . LAMINECTOMY  02/2011   L3-L5  Dr. Ellene Route  . LUMBAR LAMINECTOMY/DECOMPRESSION MICRODISCECTOMY Right 09/21/2015   Procedure: Right Lumbar Three-FourMicrodiskectomy;  Surgeon: Kristeen Miss, MD;  Location: [facility];  Service: Neurosurgery;  Laterality: Right;  Right L3-4 Microdiskectomy  . SHOULDER ARTHROSCOPY WITH  ROTATOR CUFF REPAIR Right 08/2003  . TONSILLECTOMY    . TOTAL HIP ARTHROPLASTY     right 1999    Allergies  Allergen Reactions  . Codeine Nausea Only  . Macrobid [Nitrofurantoin] Diarrhea  . Miralax [Polyethylene Glycol] Other (See Comments)    cramping  . Pain Relief [Acetaminophen] Nausea And Vomiting  . Paxil [Paroxetine Hcl] Nausea Only  . Percodan [Oxycodone-Aspirin] Itching  . Oxycodone Nausea Only  . Oxycodone-Acetaminophen Nausea Only  . Penicillins Rash    Has patient had a PCN reaction causing immediate rash, facial/tongue/throat swelling, SOB or lightheadedness with hypotension: YES Has patient had a PCN reaction causing severe rash involving mucus membranes or skin necrosis: YES Has patient had a PCN reaction that required hospitalization NO Has patient had a PCN reaction occurring within the last 10 years: NO If all of the above answers are "NO", then may proceed with Cephalosporin use.    Outpatient Encounter Medications as of 10/30/2019  Medication Sig  . Calcium-Phosphorus-Vitamin D (CITRACAL +D3) 250-107-500 MG-MG-UNIT CHEW Chew 2 tablets by mouth daily.   . Cyanocobalamin (VITAMIN B 12 PO) Take 1 tablet by mouth daily.  Marland Kitchen denosumab (PROLIA) 60 MG/ML SOSY injection Inject 60 mg into the skin every 6 (six) months.  . diltiazem (CARDIZEM CD) 240 MG 24 hr capsule TAKE 1 CAPSULE EVERY DAY.  Marland Kitchen dorzolamide-timolol (COSOPT) 22.3-6.8 MG/ML ophthalmic solution Place 1 drop into both eyes 2 (two) times daily.  . furosemide (LASIX) 20 MG tablet TAKE 1 TABLET IN THE MORNING.  Marland Kitchen ipratropium (ATROVENT) 0.06 % nasal spray USE 2 SPRAYS IN EACH NOSTRIL 3 TIMES A DAY AS NEEDED.  Marland Kitchen irbesartan (AVAPRO) 300 MG tablet TAKE 1 TABLET BY MOUTH DAILY.  Marland Kitchen LACTASE PO Take 1 tablet by mouth as needed (when eating dairy products). Take as directed  . LUMIGAN 0.01 % SOLN Place 1 drop into both eyes 2 (two) times daily.   . meloxicam (MOBIC) 7.5 MG tablet meloxicam 7.5 mg tablet  . Multiple  Vitamins-Minerals (MULTIVITAMIN WITH MINERALS) tablet Take 1 tablet by mouth daily.  Mckinley Jewel Dimesylate (RHOPRESSA) 0.02 % SOLN Apply 1 drop to eye at bedtime. Right eye  . Probiotic Product (ALIGN) 4 MG CAPS Take by mouth daily.  . rosuvastatin (CRESTOR) 5 MG tablet TAKE 1 TABLET ONCE DAILY FOR CHOLESTEROL.  . SYNTHROID 125 MCG tablet TAKE 1 TABLET ONCE DAILY.  Marland Kitchen UNABLE TO FIND Take by mouth. Med Name: OTC purified peppermint oil for IBS - 3 caps before lunch and dinner  . [DISCONTINUED] COVID-19 mRNA vaccine,  Moderna, 100 MCG/0.5ML SUSP Moderna COVID-19 Vaccine (PF) 100 mcg/0.5 mL intramuscular susp. (EUA)  PHARMACY ADMINISTERED   No facility-administered encounter medications on file as of 10/30/2019.    Review of Systems:  Review of Systems  Constitutional: Negative for chills and fever.  HENT: Negative for congestion and sore throat.   Eyes: Negative for blurred vision.  Respiratory: Negative for shortness of breath.   Cardiovascular: Positive for leg swelling. Negative for chest pain and palpitations.  Gastrointestinal: Negative for abdominal pain, blood in stool, constipation, diarrhea and melena.  Genitourinary: Negative for dysuria.  Musculoskeletal: Positive for back pain. Negative for falls.       New kyphoscoliosis  Skin: Negative for itching and rash.  Neurological: Negative for loss of consciousness.  Psychiatric/Behavioral: Negative for depression and memory loss. The patient is not nervous/anxious and does not have insomnia.     Health Maintenance  Topic Date Due  . PNA vac Low Risk Adult (2 of 2 - PCV13) 06/25/2020 (Originally 08/29/2014)  . TETANUS/TDAP  06/19/2022  . INFLUENZA VACCINE  Completed  . DEXA SCAN  Completed    Physical Exam: Vitals:   10/30/19 1145  BP: (!) 128/32  Pulse: 70  Temp: 97.7 F (36.5 C)  TempSrc: Temporal  SpO2: 95%  Weight: 133 lb (60.3 kg)  Height: _0  (1.6 m)   Body mass index is 23.56 kg/m. Physical Exam Vitals  reviewed.  Constitutional:      General: She is not in acute distress.    Appearance: Normal appearance. She is not toxic-appearing.  HENT:     Head: Normocephalic and atraumatic.  Cardiovascular:     Rate and Rhythm: Normal rate and regular rhythm.     Pulses: Normal pulses.     Heart sounds: Normal heart sounds.  Pulmonary:     Effort: Pulmonary effort is normal.     Breath sounds: Normal breath sounds. No rales.  Abdominal:     General: Bowel sounds are normal.  Musculoskeletal:        General: Normal range of motion.     Right lower leg: Edema present.     Left lower leg: Edema present.     Comments: Kyphoscoliosis--goes to right in her hips; using cane to ambulate  Skin:    General: Skin is warm and dry.  Neurological:     General: No focal deficit present.     Mental Status: She is alert and oriented to person, place, and time.     Cranial Nerves: No cranial nerve deficit.  Psychiatric:        Mood and Affect: Mood normal.        Behavior: Behavior normal.     Labs reviewed: Basic Metabolic Panel: Recent Labs    11/22/18 0000 05/14/19 0000 07/12/19 1303  NA 145 145 144  K 4.2 4.1 4.4  CL  --   --  105  CO2  --   --  26  GLUCOSE  --   --  104*  BUN _1 CREATININE 0.7 0.6 0.59  CALCIUM  --   --  10.5*  TSH  --  2.30  --    Liver Function Tests: Recent Labs    05/14/19 0000  AST 22  ALT 18  ALKPHOS 87   No results for input(s): LIPASE, AMYLASE in the last 8760 hours. No results for input(s): AMMONIA in the last 8760 hours. CBC: Recent Labs    05/14/19 0000 07/12/19 1303  WBC  6.7 7.9  NEUTROABS  --  5.0  HGB 12.8 12.8  HCT 39 40.0  MCV  --  89  PLT 230 241   Lipid Panel: Recent Labs    05/14/19 0000  CHOL 143  HDL 65  LDLCALC 53  TRIG 127   Lab Results  Component Value Date   HGBA1C 5.6 05/14/2019    Procedures since last visit: CT lumbar spine on 09/26/19: 1. Severe spinal stenosis at L2-3, L3-4, and L4-5. 2. Slight  compression of the right L4 nerve under the right pedicle by the disc protrusion at L4-5. 3. New benign-appearing mild compression fracture of the superior endplate of L3. 4. Calcified disc extrusions at L3-4 could affect the right L3 nerve in the left L4 nerve as described above.  CUP PACEART REMOTE DEVICE CHECK  Result Date: 10/09/2019 Scheduled remote reviewed.  Normal device function.  Next remote 91 days.   Assessment/Plan 1. Closed compression fracture of L3 lumbar vertebra, initial encounter ([place]) -s/p kyphoplasty -now developing increased kyphosis with this and scoliosis that she did not have noted before  2. Senile osteoporosis -has been on prolia several years, but still got a compression fx w/o any known injury -add vitamin D3 -refer to PT for best exercises for her posture and balance  -will plan to try to get evenity for her now with this recent compression fx (would get it in July when the next prolia would be due)  3. Kyphoscoliosis -developing with less core strengthening and exercise and recent compression fx -needs a good exercise program  4. Venous insufficiency of both lower extremities -cont compression hose and elevation at rest  5. Balance problem -referred back to PT due to concerns she reports other residents have had about her walking as of late -I'm also hoping that PT will get her motivated to get back to some exercise which she's quit doing and has declined physically quite a bit since then  Labs/tests ordered:  No new Next appt:  03/18/2020  Wanda Rideout L. Khalaya Mcgurn, D.O. [facility] [address], [place] [address] Cell Phone (Mon-Fri 8am-5pm):  [phone] On Call:  [phone] & follow prompts after 5pm & weekends Office Phone:  [phone] Office Fax:  [phone]

## 2019-11-05 DIAGNOSIS — M545 Low back pain: Secondary | ICD-10-CM | POA: Diagnosis not present

## 2019-11-05 DIAGNOSIS — M5136 Other intervertebral disc degeneration, lumbar region: Secondary | ICD-10-CM | POA: Diagnosis not present

## 2019-11-05 DIAGNOSIS — M4807 Spinal stenosis, lumbosacral region: Secondary | ICD-10-CM | POA: Diagnosis not present

## 2019-11-05 DIAGNOSIS — R2689 Other abnormalities of gait and mobility: Secondary | ICD-10-CM | POA: Diagnosis not present

## 2019-11-05 DIAGNOSIS — S32030G Wedge compression fracture of third lumbar vertebra, subsequent encounter for fracture with delayed healing: Secondary | ICD-10-CM | POA: Diagnosis not present

## 2019-11-05 DIAGNOSIS — R278 Other lack of coordination: Secondary | ICD-10-CM | POA: Diagnosis not present

## 2019-11-06 DIAGNOSIS — M5136 Other intervertebral disc degeneration, lumbar region: Secondary | ICD-10-CM | POA: Diagnosis not present

## 2019-11-06 DIAGNOSIS — S32030G Wedge compression fracture of third lumbar vertebra, subsequent encounter for fracture with delayed healing: Secondary | ICD-10-CM | POA: Diagnosis not present

## 2019-11-06 DIAGNOSIS — M545 Low back pain: Secondary | ICD-10-CM | POA: Diagnosis not present

## 2019-11-06 DIAGNOSIS — R2689 Other abnormalities of gait and mobility: Secondary | ICD-10-CM | POA: Diagnosis not present

## 2019-11-06 DIAGNOSIS — M4807 Spinal stenosis, lumbosacral region: Secondary | ICD-10-CM | POA: Diagnosis not present

## 2019-11-06 DIAGNOSIS — R278 Other lack of coordination: Secondary | ICD-10-CM | POA: Diagnosis not present

## 2019-11-11 DIAGNOSIS — S32030G Wedge compression fracture of third lumbar vertebra, subsequent encounter for fracture with delayed healing: Secondary | ICD-10-CM | POA: Diagnosis not present

## 2019-11-11 DIAGNOSIS — M5136 Other intervertebral disc degeneration, lumbar region: Secondary | ICD-10-CM | POA: Diagnosis not present

## 2019-11-11 DIAGNOSIS — M545 Low back pain: Secondary | ICD-10-CM | POA: Diagnosis not present

## 2019-11-11 DIAGNOSIS — R2689 Other abnormalities of gait and mobility: Secondary | ICD-10-CM | POA: Diagnosis not present

## 2019-11-11 DIAGNOSIS — M4807 Spinal stenosis, lumbosacral region: Secondary | ICD-10-CM | POA: Diagnosis not present

## 2019-11-11 DIAGNOSIS — R278 Other lack of coordination: Secondary | ICD-10-CM | POA: Diagnosis not present

## 2019-11-14 DIAGNOSIS — R278 Other lack of coordination: Secondary | ICD-10-CM | POA: Diagnosis not present

## 2019-11-14 DIAGNOSIS — M4807 Spinal stenosis, lumbosacral region: Secondary | ICD-10-CM | POA: Diagnosis not present

## 2019-11-14 DIAGNOSIS — M5136 Other intervertebral disc degeneration, lumbar region: Secondary | ICD-10-CM | POA: Diagnosis not present

## 2019-11-14 DIAGNOSIS — R2689 Other abnormalities of gait and mobility: Secondary | ICD-10-CM | POA: Diagnosis not present

## 2019-11-14 DIAGNOSIS — S32030G Wedge compression fracture of third lumbar vertebra, subsequent encounter for fracture with delayed healing: Secondary | ICD-10-CM | POA: Diagnosis not present

## 2019-11-14 DIAGNOSIS — M545 Low back pain: Secondary | ICD-10-CM | POA: Diagnosis not present

## 2019-11-15 DIAGNOSIS — M545 Low back pain: Secondary | ICD-10-CM | POA: Diagnosis not present

## 2019-11-15 DIAGNOSIS — M4807 Spinal stenosis, lumbosacral region: Secondary | ICD-10-CM | POA: Diagnosis not present

## 2019-11-15 DIAGNOSIS — R2689 Other abnormalities of gait and mobility: Secondary | ICD-10-CM | POA: Diagnosis not present

## 2019-11-15 DIAGNOSIS — M5136 Other intervertebral disc degeneration, lumbar region: Secondary | ICD-10-CM | POA: Diagnosis not present

## 2019-11-15 DIAGNOSIS — R278 Other lack of coordination: Secondary | ICD-10-CM | POA: Diagnosis not present

## 2019-11-15 DIAGNOSIS — S32030G Wedge compression fracture of third lumbar vertebra, subsequent encounter for fracture with delayed healing: Secondary | ICD-10-CM | POA: Diagnosis not present

## 2019-11-18 DIAGNOSIS — M4807 Spinal stenosis, lumbosacral region: Secondary | ICD-10-CM | POA: Diagnosis not present

## 2019-11-18 DIAGNOSIS — S32030G Wedge compression fracture of third lumbar vertebra, subsequent encounter for fracture with delayed healing: Secondary | ICD-10-CM | POA: Diagnosis not present

## 2019-11-18 DIAGNOSIS — R278 Other lack of coordination: Secondary | ICD-10-CM | POA: Diagnosis not present

## 2019-11-18 DIAGNOSIS — M5136 Other intervertebral disc degeneration, lumbar region: Secondary | ICD-10-CM | POA: Diagnosis not present

## 2019-11-18 DIAGNOSIS — R2689 Other abnormalities of gait and mobility: Secondary | ICD-10-CM | POA: Diagnosis not present

## 2019-11-18 DIAGNOSIS — M545 Low back pain: Secondary | ICD-10-CM | POA: Diagnosis not present

## 2019-11-21 DIAGNOSIS — R2689 Other abnormalities of gait and mobility: Secondary | ICD-10-CM | POA: Diagnosis not present

## 2019-11-21 DIAGNOSIS — S32030G Wedge compression fracture of third lumbar vertebra, subsequent encounter for fracture with delayed healing: Secondary | ICD-10-CM | POA: Diagnosis not present

## 2019-11-21 DIAGNOSIS — M5136 Other intervertebral disc degeneration, lumbar region: Secondary | ICD-10-CM | POA: Diagnosis not present

## 2019-11-21 DIAGNOSIS — M4807 Spinal stenosis, lumbosacral region: Secondary | ICD-10-CM | POA: Diagnosis not present

## 2019-11-21 DIAGNOSIS — M545 Low back pain: Secondary | ICD-10-CM | POA: Diagnosis not present

## 2019-11-21 DIAGNOSIS — R278 Other lack of coordination: Secondary | ICD-10-CM | POA: Diagnosis not present

## 2019-11-25 DIAGNOSIS — M4807 Spinal stenosis, lumbosacral region: Secondary | ICD-10-CM | POA: Diagnosis not present

## 2019-11-25 DIAGNOSIS — R278 Other lack of coordination: Secondary | ICD-10-CM | POA: Diagnosis not present

## 2019-11-25 DIAGNOSIS — S32030G Wedge compression fracture of third lumbar vertebra, subsequent encounter for fracture with delayed healing: Secondary | ICD-10-CM | POA: Diagnosis not present

## 2019-11-25 DIAGNOSIS — M5136 Other intervertebral disc degeneration, lumbar region: Secondary | ICD-10-CM | POA: Diagnosis not present

## 2019-11-25 DIAGNOSIS — R2689 Other abnormalities of gait and mobility: Secondary | ICD-10-CM | POA: Diagnosis not present

## 2019-11-25 DIAGNOSIS — M545 Low back pain: Secondary | ICD-10-CM | POA: Diagnosis not present

## 2019-11-26 ENCOUNTER — Other Ambulatory Visit: Payer: Self-pay | Admitting: Internal Medicine

## 2019-11-26 NOTE — Telephone Encounter (Signed)
rx sent to pharmacy by e-script  

## 2019-12-02 DIAGNOSIS — M5136 Other intervertebral disc degeneration, lumbar region: Secondary | ICD-10-CM | POA: Diagnosis not present

## 2019-12-02 DIAGNOSIS — M545 Low back pain: Secondary | ICD-10-CM | POA: Diagnosis not present

## 2019-12-02 DIAGNOSIS — M4807 Spinal stenosis, lumbosacral region: Secondary | ICD-10-CM | POA: Diagnosis not present

## 2019-12-02 DIAGNOSIS — S32030G Wedge compression fracture of third lumbar vertebra, subsequent encounter for fracture with delayed healing: Secondary | ICD-10-CM | POA: Diagnosis not present

## 2019-12-02 DIAGNOSIS — R2689 Other abnormalities of gait and mobility: Secondary | ICD-10-CM | POA: Diagnosis not present

## 2019-12-02 DIAGNOSIS — R278 Other lack of coordination: Secondary | ICD-10-CM | POA: Diagnosis not present

## 2019-12-10 ENCOUNTER — Other Ambulatory Visit: Payer: Self-pay | Admitting: Internal Medicine

## 2019-12-10 DIAGNOSIS — R143 Flatulence: Secondary | ICD-10-CM | POA: Diagnosis not present

## 2019-12-12 DIAGNOSIS — H401113 Primary open-angle glaucoma, right eye, severe stage: Secondary | ICD-10-CM | POA: Diagnosis not present

## 2019-12-12 DIAGNOSIS — H401132 Primary open-angle glaucoma, bilateral, moderate stage: Secondary | ICD-10-CM | POA: Diagnosis not present

## 2019-12-12 DIAGNOSIS — H04123 Dry eye syndrome of bilateral lacrimal glands: Secondary | ICD-10-CM | POA: Diagnosis not present

## 2019-12-18 DIAGNOSIS — M5136 Other intervertebral disc degeneration, lumbar region: Secondary | ICD-10-CM | POA: Diagnosis not present

## 2019-12-18 DIAGNOSIS — M6389 Disorders of muscle in diseases classified elsewhere, multiple sites: Secondary | ICD-10-CM | POA: Diagnosis not present

## 2019-12-18 DIAGNOSIS — M4807 Spinal stenosis, lumbosacral region: Secondary | ICD-10-CM | POA: Diagnosis not present

## 2019-12-18 DIAGNOSIS — M19012 Primary osteoarthritis, left shoulder: Secondary | ICD-10-CM | POA: Diagnosis not present

## 2019-12-18 DIAGNOSIS — R278 Other lack of coordination: Secondary | ICD-10-CM | POA: Diagnosis not present

## 2019-12-18 DIAGNOSIS — M19011 Primary osteoarthritis, right shoulder: Secondary | ICD-10-CM | POA: Diagnosis not present

## 2019-12-23 DIAGNOSIS — M5136 Other intervertebral disc degeneration, lumbar region: Secondary | ICD-10-CM | POA: Diagnosis not present

## 2019-12-23 DIAGNOSIS — M19012 Primary osteoarthritis, left shoulder: Secondary | ICD-10-CM | POA: Diagnosis not present

## 2019-12-23 DIAGNOSIS — M4807 Spinal stenosis, lumbosacral region: Secondary | ICD-10-CM | POA: Diagnosis not present

## 2019-12-23 DIAGNOSIS — R278 Other lack of coordination: Secondary | ICD-10-CM | POA: Diagnosis not present

## 2019-12-23 DIAGNOSIS — M19011 Primary osteoarthritis, right shoulder: Secondary | ICD-10-CM | POA: Diagnosis not present

## 2019-12-23 DIAGNOSIS — M6389 Disorders of muscle in diseases classified elsewhere, multiple sites: Secondary | ICD-10-CM | POA: Diagnosis not present

## 2019-12-26 DIAGNOSIS — R278 Other lack of coordination: Secondary | ICD-10-CM | POA: Diagnosis not present

## 2019-12-26 DIAGNOSIS — M6389 Disorders of muscle in diseases classified elsewhere, multiple sites: Secondary | ICD-10-CM | POA: Diagnosis not present

## 2019-12-26 DIAGNOSIS — M19012 Primary osteoarthritis, left shoulder: Secondary | ICD-10-CM | POA: Diagnosis not present

## 2019-12-26 DIAGNOSIS — M4807 Spinal stenosis, lumbosacral region: Secondary | ICD-10-CM | POA: Diagnosis not present

## 2019-12-26 DIAGNOSIS — M5136 Other intervertebral disc degeneration, lumbar region: Secondary | ICD-10-CM | POA: Diagnosis not present

## 2019-12-26 DIAGNOSIS — M19011 Primary osteoarthritis, right shoulder: Secondary | ICD-10-CM | POA: Diagnosis not present

## 2019-12-31 DIAGNOSIS — M4807 Spinal stenosis, lumbosacral region: Secondary | ICD-10-CM | POA: Diagnosis not present

## 2019-12-31 DIAGNOSIS — M5136 Other intervertebral disc degeneration, lumbar region: Secondary | ICD-10-CM | POA: Diagnosis not present

## 2019-12-31 DIAGNOSIS — M6389 Disorders of muscle in diseases classified elsewhere, multiple sites: Secondary | ICD-10-CM | POA: Diagnosis not present

## 2019-12-31 DIAGNOSIS — M19012 Primary osteoarthritis, left shoulder: Secondary | ICD-10-CM | POA: Diagnosis not present

## 2019-12-31 DIAGNOSIS — R278 Other lack of coordination: Secondary | ICD-10-CM | POA: Diagnosis not present

## 2019-12-31 DIAGNOSIS — M19011 Primary osteoarthritis, right shoulder: Secondary | ICD-10-CM | POA: Diagnosis not present

## 2020-01-01 ENCOUNTER — Other Ambulatory Visit: Payer: Self-pay | Admitting: Internal Medicine

## 2020-01-01 NOTE — Telephone Encounter (Signed)
rx sent to pharmacy by e-script  

## 2020-01-02 DIAGNOSIS — R278 Other lack of coordination: Secondary | ICD-10-CM | POA: Diagnosis not present

## 2020-01-02 DIAGNOSIS — M4807 Spinal stenosis, lumbosacral region: Secondary | ICD-10-CM | POA: Diagnosis not present

## 2020-01-02 DIAGNOSIS — M19012 Primary osteoarthritis, left shoulder: Secondary | ICD-10-CM | POA: Diagnosis not present

## 2020-01-02 DIAGNOSIS — M5136 Other intervertebral disc degeneration, lumbar region: Secondary | ICD-10-CM | POA: Diagnosis not present

## 2020-01-02 DIAGNOSIS — M6389 Disorders of muscle in diseases classified elsewhere, multiple sites: Secondary | ICD-10-CM | POA: Diagnosis not present

## 2020-01-02 DIAGNOSIS — M19011 Primary osteoarthritis, right shoulder: Secondary | ICD-10-CM | POA: Diagnosis not present

## 2020-01-07 DIAGNOSIS — R278 Other lack of coordination: Secondary | ICD-10-CM | POA: Diagnosis not present

## 2020-01-07 DIAGNOSIS — M6389 Disorders of muscle in diseases classified elsewhere, multiple sites: Secondary | ICD-10-CM | POA: Diagnosis not present

## 2020-01-07 DIAGNOSIS — M19011 Primary osteoarthritis, right shoulder: Secondary | ICD-10-CM | POA: Diagnosis not present

## 2020-01-07 DIAGNOSIS — M19012 Primary osteoarthritis, left shoulder: Secondary | ICD-10-CM | POA: Diagnosis not present

## 2020-01-07 DIAGNOSIS — M4807 Spinal stenosis, lumbosacral region: Secondary | ICD-10-CM | POA: Diagnosis not present

## 2020-01-07 DIAGNOSIS — M5136 Other intervertebral disc degeneration, lumbar region: Secondary | ICD-10-CM | POA: Diagnosis not present

## 2020-01-08 ENCOUNTER — Telehealth: Payer: Self-pay

## 2020-01-08 ENCOUNTER — Ambulatory Visit: Payer: Medicare Other

## 2020-01-08 ENCOUNTER — Telehealth: Payer: Self-pay | Admitting: Internal Medicine

## 2020-01-08 NOTE — Telephone Encounter (Signed)
I let the pt know we got her message that she will be receiving a new monitor. I told her when she receive it let the monitor charge for one hour. Then she can send a manual transmission. I gave her my direct office number in case she need my help sending the transmission.

## 2020-01-08 NOTE — Telephone Encounter (Signed)
Patient had to contact [employer] to get a another monitor. She states it will not be delivered for another 7-10 days but once it is she will send in a transmission.

## 2020-01-08 NOTE — Telephone Encounter (Signed)
Left message for patient to remind of missed remote transmission.  

## 2020-01-09 DIAGNOSIS — M5136 Other intervertebral disc degeneration, lumbar region: Secondary | ICD-10-CM | POA: Diagnosis not present

## 2020-01-09 DIAGNOSIS — M19011 Primary osteoarthritis, right shoulder: Secondary | ICD-10-CM | POA: Diagnosis not present

## 2020-01-09 DIAGNOSIS — M19012 Primary osteoarthritis, left shoulder: Secondary | ICD-10-CM | POA: Diagnosis not present

## 2020-01-09 DIAGNOSIS — R278 Other lack of coordination: Secondary | ICD-10-CM | POA: Diagnosis not present

## 2020-01-09 DIAGNOSIS — M4807 Spinal stenosis, lumbosacral region: Secondary | ICD-10-CM | POA: Diagnosis not present

## 2020-01-09 DIAGNOSIS — M6389 Disorders of muscle in diseases classified elsewhere, multiple sites: Secondary | ICD-10-CM | POA: Diagnosis not present

## 2020-01-14 LAB — CUP PACEART REMOTE DEVICE CHECK
Battery Remaining Longevity: 81 mo
Battery Voltage: 3.01 V
Brady Statistic AP VP Percent: 2.38 %
Brady Statistic AP VS Percent: 9.49 %
Brady Statistic AS VP Percent: 18.77 %
Brady Statistic AS VS Percent: 69.37 %
Brady Statistic RA Percent Paced: 11.75 %
Brady Statistic RV Percent Paced: 21.04 %
Date Time Interrogation Session: 20210517114510
Implantable Lead Implant Date: 20171219
Implantable Lead Implant Date: 20171219
Implantable Lead Location: 753859
Implantable Lead Location: 753860
Implantable Lead Model: 5076
Implantable Lead Model: 5076
Implantable Pulse Generator Implant Date: 20171219
Lead Channel Impedance Value: 285 Ohm
Lead Channel Impedance Value: 304 Ohm
Lead Channel Impedance Value: 323 Ohm
Lead Channel Impedance Value: 380 Ohm
Lead Channel Pacing Threshold Amplitude: 0.625 V
Lead Channel Pacing Threshold Amplitude: 1.125 V
Lead Channel Pacing Threshold Pulse Width: 0.4 ms
Lead Channel Pacing Threshold Pulse Width: 0.4 ms
Lead Channel Sensing Intrinsic Amplitude: 2.875 mV
Lead Channel Sensing Intrinsic Amplitude: 2.875 mV
Lead Channel Sensing Intrinsic Amplitude: 5.125 mV
Lead Channel Sensing Intrinsic Amplitude: 5.125 mV
Lead Channel Setting Pacing Amplitude: 2 V
Lead Channel Setting Pacing Amplitude: 2.5 V
Lead Channel Setting Pacing Pulse Width: 0.4 ms
Lead Channel Setting Sensing Sensitivity: 2.8 mV

## 2020-01-15 ENCOUNTER — Other Ambulatory Visit: Payer: Self-pay | Admitting: Internal Medicine

## 2020-01-16 DIAGNOSIS — M19012 Primary osteoarthritis, left shoulder: Secondary | ICD-10-CM | POA: Diagnosis not present

## 2020-01-16 DIAGNOSIS — M6389 Disorders of muscle in diseases classified elsewhere, multiple sites: Secondary | ICD-10-CM | POA: Diagnosis not present

## 2020-01-16 DIAGNOSIS — M4807 Spinal stenosis, lumbosacral region: Secondary | ICD-10-CM | POA: Diagnosis not present

## 2020-01-16 DIAGNOSIS — M19011 Primary osteoarthritis, right shoulder: Secondary | ICD-10-CM | POA: Diagnosis not present

## 2020-01-16 DIAGNOSIS — R278 Other lack of coordination: Secondary | ICD-10-CM | POA: Diagnosis not present

## 2020-01-16 DIAGNOSIS — M5136 Other intervertebral disc degeneration, lumbar region: Secondary | ICD-10-CM | POA: Diagnosis not present

## 2020-01-29 ENCOUNTER — Other Ambulatory Visit: Payer: Self-pay | Admitting: Internal Medicine

## 2020-01-30 DIAGNOSIS — M19011 Primary osteoarthritis, right shoulder: Secondary | ICD-10-CM | POA: Diagnosis not present

## 2020-01-30 DIAGNOSIS — R278 Other lack of coordination: Secondary | ICD-10-CM | POA: Diagnosis not present

## 2020-01-30 DIAGNOSIS — M5136 Other intervertebral disc degeneration, lumbar region: Secondary | ICD-10-CM | POA: Diagnosis not present

## 2020-01-30 DIAGNOSIS — M6389 Disorders of muscle in diseases classified elsewhere, multiple sites: Secondary | ICD-10-CM | POA: Diagnosis not present

## 2020-01-30 DIAGNOSIS — M4807 Spinal stenosis, lumbosacral region: Secondary | ICD-10-CM | POA: Diagnosis not present

## 2020-01-30 DIAGNOSIS — M19012 Primary osteoarthritis, left shoulder: Secondary | ICD-10-CM | POA: Diagnosis not present

## 2020-02-13 DIAGNOSIS — R278 Other lack of coordination: Secondary | ICD-10-CM | POA: Diagnosis not present

## 2020-02-13 DIAGNOSIS — M4807 Spinal stenosis, lumbosacral region: Secondary | ICD-10-CM | POA: Diagnosis not present

## 2020-02-13 DIAGNOSIS — M6389 Disorders of muscle in diseases classified elsewhere, multiple sites: Secondary | ICD-10-CM | POA: Diagnosis not present

## 2020-02-13 DIAGNOSIS — M19012 Primary osteoarthritis, left shoulder: Secondary | ICD-10-CM | POA: Diagnosis not present

## 2020-02-13 DIAGNOSIS — M19011 Primary osteoarthritis, right shoulder: Secondary | ICD-10-CM | POA: Diagnosis not present

## 2020-02-13 DIAGNOSIS — M5136 Other intervertebral disc degeneration, lumbar region: Secondary | ICD-10-CM | POA: Diagnosis not present

## 2020-02-14 DIAGNOSIS — R300 Dysuria: Secondary | ICD-10-CM | POA: Diagnosis not present

## 2020-02-14 DIAGNOSIS — N3000 Acute cystitis without hematuria: Secondary | ICD-10-CM | POA: Diagnosis not present

## 2020-02-20 DIAGNOSIS — R278 Other lack of coordination: Secondary | ICD-10-CM | POA: Diagnosis not present

## 2020-02-20 DIAGNOSIS — M6389 Disorders of muscle in diseases classified elsewhere, multiple sites: Secondary | ICD-10-CM | POA: Diagnosis not present

## 2020-02-20 DIAGNOSIS — M19011 Primary osteoarthritis, right shoulder: Secondary | ICD-10-CM | POA: Diagnosis not present

## 2020-02-20 DIAGNOSIS — M19012 Primary osteoarthritis, left shoulder: Secondary | ICD-10-CM | POA: Diagnosis not present

## 2020-02-20 DIAGNOSIS — M4807 Spinal stenosis, lumbosacral region: Secondary | ICD-10-CM | POA: Diagnosis not present

## 2020-02-20 DIAGNOSIS — M5136 Other intervertebral disc degeneration, lumbar region: Secondary | ICD-10-CM | POA: Diagnosis not present

## 2020-03-04 ENCOUNTER — Other Ambulatory Visit: Payer: Self-pay

## 2020-03-04 ENCOUNTER — Encounter: Payer: Self-pay | Admitting: Internal Medicine

## 2020-03-04 ENCOUNTER — Ambulatory Visit (INDEPENDENT_AMBULATORY_CARE_PROVIDER_SITE_OTHER): Payer: Medicare Other | Admitting: Internal Medicine

## 2020-03-04 VITALS — BP 156/68 | HR 72 | Ht 63.0 in | Wt 140.0 lb

## 2020-03-04 DIAGNOSIS — I10 Essential (primary) hypertension: Secondary | ICD-10-CM

## 2020-03-04 DIAGNOSIS — I441 Atrioventricular block, second degree: Secondary | ICD-10-CM | POA: Diagnosis not present

## 2020-03-04 LAB — CUP PACEART INCLINIC DEVICE CHECK
Battery Remaining Longevity: 85 mo
Battery Voltage: 3.01 V
Brady Statistic AP VP Percent: 2.72 %
Brady Statistic AP VS Percent: 9.51 %
Brady Statistic AS VP Percent: 20.28 %
Brady Statistic AS VS Percent: 67.49 %
Brady Statistic RA Percent Paced: 12.12 %
Brady Statistic RV Percent Paced: 22.87 %
Date Time Interrogation Session: 20210707165825
Implantable Lead Implant Date: 20171219
Implantable Lead Implant Date: 20171219
Implantable Lead Location: 753859
Implantable Lead Location: 753860
Implantable Lead Model: 5076
Implantable Lead Model: 5076
Implantable Pulse Generator Implant Date: 20171219
Lead Channel Impedance Value: 285 Ohm
Lead Channel Impedance Value: 342 Ohm
Lead Channel Impedance Value: 342 Ohm
Lead Channel Impedance Value: 418 Ohm
Lead Channel Pacing Threshold Amplitude: 0.75 V
Lead Channel Pacing Threshold Amplitude: 1.25 V
Lead Channel Pacing Threshold Pulse Width: 0.4 ms
Lead Channel Pacing Threshold Pulse Width: 0.4 ms
Lead Channel Sensing Intrinsic Amplitude: 2.625 mV
Lead Channel Sensing Intrinsic Amplitude: 6.5 mV
Lead Channel Setting Pacing Amplitude: 2 V
Lead Channel Setting Pacing Amplitude: 2.75 V
Lead Channel Setting Pacing Pulse Width: 0.4 ms
Lead Channel Setting Sensing Sensitivity: 2.8 mV

## 2020-03-04 NOTE — Progress Notes (Signed)
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 female who presents today for routine electrophysiology followup.  Since last being seen in our clinic, the patient reports doing very well.  She has stable SOB and edema.  Today, she denies symptoms of palpitations, chest pain,   dizziness, presyncope, or syncope.  The patient is otherwise without complaint today.   Past Medical History:  Diagnosis Date  . Allergy   . Arthritis   . 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 Cataract   . Chronic anxiety   . Constipation   . Diverticulosis of sigmoid colon   . Emphysema of lung ([place])   . GERD (gastroesophageal reflux disease)   . Glaucoma   . H/O adenomatous polyp of colon    Dr. Cristina Gong  . High cholesterol   . HTN (hypertension) 07/17/2015   Echo 08/2019: EF 46-80, grade 1 diastolic dysfunction, mild apical HK, normal RV SF, trivial pericardial effusion, moderate MAC, mild MR, mild TR  . Hypothyroidism   . Insomnia   . Lactose intolerance   . Lumbar degenerative disc disease   . Myoview    Myoview 06/2019: EF 48, normal perfusion; low risk (EF normal by echo 10/2018)  . Onychomycosis   . Osteoarthritis   . Osteoporosis   . Second degree Mobitz II AV block    s/p PPM  . Spinal stenosis   . Thyroid disease   . Varicose veins    Past Surgical History:  Procedure Laterality Date  . ABDOMINAL HYSTERECTOMY    . BLADDER REPAIR    . blephoplasty bilateral  03/10/2014  . CATARACT EXTRACTION W/ INTRAOCULAR LENS  IMPLANT, BILATERAL  7 & 8 2014  . CHOLECYSTECTOMY    . COLONOSCOPY  05/18/2012  . EP IMPLANTABLE DEVICE N/A 08/16/2016   MDT Advisa MRI conditional PPM implanted by Dr Rayann Heman for mobitz II second degree AV block  . EYE SURGERY Right 05/02/2018   laser eye surgery  . herniated disc repair    . INCONTINENCE SURGERY    . JOINT REPLACEMENT    . LAMINECTOMY  02/2011   L3-L5  Dr. Ellene Route  .  LUMBAR LAMINECTOMY/DECOMPRESSION MICRODISCECTOMY Right 09/21/2015   Procedure: Right Lumbar Three-FourMicrodiskectomy;  Surgeon: Kristeen Miss, MD;  Location: [facility];  Service: Neurosurgery;  Laterality: Right;  Right L3-4 Microdiskectomy  . SHOULDER ARTHROSCOPY WITH ROTATOR CUFF REPAIR Right 08/2003  . TONSILLECTOMY    . TOTAL HIP ARTHROPLASTY     right 1999    ROS- all systems are reviewed and negative except as per HPI above  Current Outpatient Medications  Medication Sig Dispense Refill  . Calcium-Phosphorus-Vitamin D (CITRACAL +D3) 250-107-500 MG-MG-UNIT CHEW Chew 2 tablets by mouth daily.     . Cyanocobalamin (VITAMIN B 12 PO) Take 1 tablet by mouth daily.    Marland Kitchen denosumab (PROLIA) 60 MG/ML SOSY injection Inject 60 mg into the skin every 6 (six) months.    . diltiazem (CARDIZEM CD) 240 MG 24 hr capsule TAKE 1 CAPSULE EVERY DAY. 90 capsule 0  . dorzolamide-timolol (COSOPT) 22.3-6.8 MG/ML ophthalmic solution Place 1 drop into both eyes 2 (two) times daily.  5  . furosemide (LASIX) 20 MG tablet TAKE 1 TABLET IN THE MORNING. 90 tablet 0  . ipratropium (ATROVENT) 0.06 % nasal spray USE 2 SPRAYS IN EACH NOSTRIL 3 TIMES A DAY AS NEEDED.  15 mL 0  . irbesartan (AVAPRO) 300 MG tablet TAKE 1 TABLET BY MOUTH DAILY. 90 tablet 0  . LACTASE PO Take 1 tablet by mouth as needed (when eating dairy products). Take as directed    . LUMIGAN 0.01 % SOLN Place 1 drop into both eyes 2 (two) times daily.   98  . meloxicam (MOBIC) 7.5 MG tablet meloxicam 7.5 mg tablet    . Multiple Vitamins-Minerals (MULTIVITAMIN WITH MINERALS) tablet Take 1 tablet by mouth daily.    Mckinley Jewel Dimesylate (RHOPRESSA) 0.02 % SOLN Apply 1 drop to eye at bedtime. Right eye    . Probiotic Product (ALIGN) 4 MG CAPS Take by mouth daily.    . rosuvastatin (CRESTOR) 5 MG tablet TAKE 1 TABLET ONCE DAILY FOR CHOLESTEROL. 90 tablet 1  . SYNTHROID 125 MCG tablet TAKE 1 TABLET ONCE DAILY. 90 tablet 0  . UNABLE TO FIND Take by mouth.  Med Name: OTC purified peppermint oil for IBS - 3 caps before lunch and dinner     No current facility-administered medications for this visit.    Physical Exam: Vitals:   03/04/20 1448  BP: (!) 156/68  Pulse: 72  SpO2: 94%  Weight: 140 lb (63.5 kg)  Height: _0  (1.6 m)    GEN- The patient is elderly appearing, alert and oriented x 3 today.   Head- normocephalic, atraumatic Eyes-  Sclera clear, conjunctiva pink Ears- hearing intact Oropharynx- clear Lungs-  normal work of breathing Chest- pacemaker pocket is well healed Heart- Regular rate and rhythm  GI- soft  Extremities- no clubbing, cyanosis, + dependant edema with venous insufficiency  Pacemaker interrogation- reviewed in detail today,  See PACEART report  ekg tracing ordered today is personally reviewed and shows atrial paced rhythm  Assessment and Plan:  1. Symptomatic mobitz II second degree heart block Normal pacemaker function See Pace Art report No changes today she is not device dependant today  2. HTN Elevated She reports that her BP is better controlled typically and does not wish to make changes today No change required today  3. HL Doing well with crestor   Risks, benefits and potential toxicities for medications prescribed and/or refilled reviewed with patient today.   Return to see EP APP in a year  Thompson Grayer MD, [facility] 03/04/2020 2:56 PM

## 2020-03-04 NOTE — Patient Instructions (Addendum)
Medication Instructions:  Your physician recommends that you continue on your current medications as directed. Please refer to the Current Medication list given to you today.  *If you need a refill on your cardiac medications before your next appointment, please call your pharmacy*  Lab Work: None ordered.  If you have labs (blood work) drawn today and your tests are completely normal, you will receive your results only by: Marland Kitchen MyChart Message (if you have MyChart) OR . A paper copy in the mail If you have any lab test that is abnormal or we need to change your treatment, we will call you to review the results.  Testing/Procedures: None ordered.  Follow-Up: At [facility], you and your health needs are our priority.  As part of our continuing mission to provide you with exceptional heart care, we have created designated Provider Care Teams.  These Care Teams include your primary Cardiologist (physician) and Advanced Practice Providers (APPs -  Physician Assistants and Nurse Practitioners) who all work together to provide you with the care you need, when you need it.  We recommend signing up for the patient portal called "MyChart".  Sign up information is provided on this After Visit Summary.  MyChart is used to connect with patients for Virtual Visits (Telemedicine).  Patients are able to view lab/test results, encounter notes, upcoming appointments, etc.  Non-urgent messages can be sent to your provider as well.   To learn more about what you can do with MyChart, go to [email or web address].    Your next appointment:   Your physician wants you to follow-up in: 1 year with Destiny Standard, PA. You will receive a reminder letter in the mail two months in advance. If you don't receive a letter, please call our office to schedule the follow-up appointment.  Remote monitoring is used to monitor your Pacemaker from home. This monitoring reduces the number of office visits required to check your  device to one time per year. It allows [place] to keep an eye on the functioning of your device to ensure it is working properly. You are scheduled for a device check from home on 04/08/2020. You may send your transmission at any time that day. If you have a wireless device, the transmission will be sent automatically. After your physician reviews your transmission, you will receive a postcard with your next transmission date.  Other Instructions:

## 2020-03-10 ENCOUNTER — Other Ambulatory Visit: Payer: Self-pay | Admitting: Internal Medicine

## 2020-03-11 ENCOUNTER — Other Ambulatory Visit: Payer: Self-pay | Admitting: Internal Medicine

## 2020-03-11 NOTE — Telephone Encounter (Signed)
Medication on hold for patient upcoming appointment 03/18/2020. Medication SYNTHROID 125 MCG TABLET states it is not on the preferred formulary for the patient's insurance plan. Asking for alternative medication. Prescriptions pend and sent to PCP for further evaluation Reed, Tiffany L, DO. Please Advise.

## 2020-03-12 DIAGNOSIS — M4807 Spinal stenosis, lumbosacral region: Secondary | ICD-10-CM | POA: Diagnosis not present

## 2020-03-12 DIAGNOSIS — M5136 Other intervertebral disc degeneration, lumbar region: Secondary | ICD-10-CM | POA: Diagnosis not present

## 2020-03-12 DIAGNOSIS — M19011 Primary osteoarthritis, right shoulder: Secondary | ICD-10-CM | POA: Diagnosis not present

## 2020-03-12 DIAGNOSIS — R278 Other lack of coordination: Secondary | ICD-10-CM | POA: Diagnosis not present

## 2020-03-12 DIAGNOSIS — M19012 Primary osteoarthritis, left shoulder: Secondary | ICD-10-CM | POA: Diagnosis not present

## 2020-03-12 DIAGNOSIS — M6389 Disorders of muscle in diseases classified elsewhere, multiple sites: Secondary | ICD-10-CM | POA: Diagnosis not present

## 2020-03-18 ENCOUNTER — Non-Acute Institutional Stay: Payer: Medicare Other | Admitting: Internal Medicine

## 2020-03-18 ENCOUNTER — Telehealth: Payer: Self-pay | Admitting: Internal Medicine

## 2020-03-18 ENCOUNTER — Other Ambulatory Visit: Payer: Self-pay

## 2020-03-18 ENCOUNTER — Ambulatory Visit (INDEPENDENT_AMBULATORY_CARE_PROVIDER_SITE_OTHER): Payer: Medicare Other | Admitting: *Deleted

## 2020-03-18 ENCOUNTER — Encounter: Payer: Self-pay | Admitting: Internal Medicine

## 2020-03-18 VITALS — BP 120/78 | HR 68 | Temp 97.2°F | Ht 63.0 in | Wt 140.0 lb

## 2020-03-18 DIAGNOSIS — S32030S Wedge compression fracture of third lumbar vertebra, sequela: Secondary | ICD-10-CM | POA: Diagnosis not present

## 2020-03-18 DIAGNOSIS — M419 Scoliosis, unspecified: Secondary | ICD-10-CM | POA: Diagnosis not present

## 2020-03-18 DIAGNOSIS — R739 Hyperglycemia, unspecified: Secondary | ICD-10-CM | POA: Diagnosis not present

## 2020-03-18 DIAGNOSIS — M810 Age-related osteoporosis without current pathological fracture: Secondary | ICD-10-CM

## 2020-03-18 DIAGNOSIS — M2142 Flat foot [pes planus] (acquired), left foot: Secondary | ICD-10-CM

## 2020-03-18 DIAGNOSIS — M2141 Flat foot [pes planus] (acquired), right foot: Secondary | ICD-10-CM | POA: Diagnosis not present

## 2020-03-18 DIAGNOSIS — E034 Atrophy of thyroid (acquired): Secondary | ICD-10-CM

## 2020-03-18 DIAGNOSIS — I872 Venous insufficiency (chronic) (peripheral): Secondary | ICD-10-CM

## 2020-03-18 DIAGNOSIS — M2140 Flat foot [pes planus] (acquired), unspecified foot: Secondary | ICD-10-CM | POA: Insufficient documentation

## 2020-03-18 MED ORDER — DENOSUMAB 60 MG/ML ~~LOC~~ SOSY
60.0000 mg | PREFILLED_SYRINGE | Freq: Once | SUBCUTANEOUS | Status: AC
  Administered 2020-03-18: 60 mg via SUBCUTANEOUS

## 2020-03-18 NOTE — Telephone Encounter (Signed)
When I saw Destiny [name] today, we decided to try her on evenity--this would start for her in 6 months because she just had prolia at [facility] this morning.  She had a L3 compression fracture while on prolia so she should qualify for her severe osteoporosis.  Is willing to come to [facility] monthly for evenity injections.  Please complete the authorization form and give to [place].  Thanks.

## 2020-03-18 NOTE — Progress Notes (Addendum)
Location:  Occupational psychologist of Service:  Clinic (12)  Provider: Zannie Runkle L. Mariea Clonts, D.O., C.M.D.  Code Status: DNR Goals of Care:  Advanced Directives 03/18/2020  Does Patient Have a Medical Advance Directive? Yes  Type of Paramedic of [place];Out of facility DNR (pink MOST or yellow form)  Does patient want to make changes to medical advance directive? No - Patient declined  Copy of [place] in Chart? Yes - validated most recent copy scanned in chart (See row information)  Pre-existing out of facility DNR order (yellow form or pink MOST form) Pink MOST/Yellow Form most recent copy in chart - Physician notified to receive inpatient order     Chief Complaint  Patient presents with  . Medical Management of Chronic Issues    4 month follow up     HPI: Patient is a 86 y.o. female seen today for medical management of chronic diseases.    Her 86 yo hip was doing fine.  Turned out it was her compression fx in her spine and scoliosis.  She had the kyphoplasty 2/4, but still gets considerable back pain in the afternoon.  Dr. Ellene Route thought her pain was from her scoliosis.  She's wearing the brace which she did on her own b/c she cannot stand up straight.  She had PT with Thamas Jaegers which she uses the rollator (but can't put it in the car).  She takes aleve for her pain, but tries not to all of the time.    She also is still having some problem from her feet getting so flat.  She overpronates.  Has pain in medial knees and lateral buttocks.  She had gone to podiatry and got orthotics for high arches.  Shoes she got did not fit, they were revised and did fit and needed new shoelaces.  Was waking up with pain at night and there's improvement but still bothersome.   She had her prolia shot this morning.  On calcium with D.    She had been walking at the pool but lost her spot after covid.    She admits she ate a lot of candy from her  family during covid isolation.    Past Medical History:  Diagnosis Date  . Allergy   . Arthritis   . 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 Cataract   . Chronic anxiety   . Constipation   . Diverticulosis of sigmoid colon   . Emphysema of lung ([place])   . GERD (gastroesophageal reflux disease)   . Glaucoma   . H/O adenomatous polyp of colon    Dr. Cristina Gong  . High cholesterol   . HTN (hypertension) 07/17/2015   Echo 08/2019: EF 93-81, grade 1 diastolic dysfunction, mild apical HK, normal RV SF, trivial pericardial effusion, moderate MAC, mild MR, mild TR  . Hypothyroidism   . Insomnia   . Lactose intolerance   . Lumbar degenerative disc disease   . Myoview    Myoview 06/2019: EF 48, normal perfusion; low risk (EF normal by echo 10/2018)  . Onychomycosis   . Osteoarthritis   . Osteoporosis   . Second degree Mobitz II AV block    s/p PPM  . Spinal stenosis   . Thyroid disease   . Varicose veins     Past Surgical History:  Procedure Laterality Date  . ABDOMINAL HYSTERECTOMY    . BLADDER REPAIR    .  blephoplasty bilateral  03/10/2014  . CATARACT EXTRACTION W/ INTRAOCULAR LENS  IMPLANT, BILATERAL  7 & 8 2014  . CHOLECYSTECTOMY    . COLONOSCOPY  05/18/2012  . EP IMPLANTABLE DEVICE N/A 08/16/2016   MDT Advisa MRI conditional PPM implanted by Dr Rayann Heman for mobitz II second degree AV block  . EYE SURGERY Right 05/02/2018   laser eye surgery  . herniated disc repair    . INCONTINENCE SURGERY    . JOINT REPLACEMENT    . LAMINECTOMY  02/2011   L3-L5  Dr. Ellene Route  . LUMBAR LAMINECTOMY/DECOMPRESSION MICRODISCECTOMY Right 09/21/2015   Procedure: Right Lumbar Three-FourMicrodiskectomy;  Surgeon: Kristeen Miss, MD;  Location: [facility];  Service: Neurosurgery;  Laterality: Right;  Right L3-4 Microdiskectomy  . SHOULDER ARTHROSCOPY WITH ROTATOR CUFF REPAIR Right 08/2003  . TONSILLECTOMY    . TOTAL HIP ARTHROPLASTY     right 1999     Allergies  Allergen Reactions  . Codeine Nausea Only  . Macrobid [Nitrofurantoin] Diarrhea  . Miralax [Polyethylene Glycol] Other (See Comments)    cramping  . Pain Relief [Acetaminophen] Nausea And Vomiting  . Paxil [Paroxetine Hcl] Nausea Only  . Percodan [Oxycodone-Aspirin] Itching  . Oxycodone Nausea Only  . Oxycodone-Acetaminophen Nausea Only  . Penicillins Rash    Has patient had a PCN reaction causing immediate rash, facial/tongue/throat swelling, SOB or lightheadedness with hypotension: YES Has patient had a PCN reaction causing severe rash involving mucus membranes or skin necrosis: YES Has patient had a PCN reaction that required hospitalization NO Has patient had a PCN reaction occurring within the last 10 years: NO If all of the above answers are "NO", then may proceed with Cephalosporin use.    Outpatient Encounter Medications as of 03/18/2020  Medication Sig  . Calcium-Phosphorus-Vitamin D (CITRACAL +D3) 250-107-500 MG-MG-UNIT CHEW Chew 2 tablets by mouth daily.   . Cyanocobalamin (VITAMIN B 12 PO) Take 1 tablet by mouth daily.  Marland Kitchen denosumab (PROLIA) 60 MG/ML SOSY injection Inject 60 mg into the skin every 6 (six) months.  . diltiazem (CARDIZEM CD) 240 MG 24 hr capsule TAKE 1 CAPSULE EVERY DAY.  Marland Kitchen dorzolamide-timolol (COSOPT) 22.3-6.8 MG/ML ophthalmic solution Place 1 drop into both eyes 2 (two) times daily.  . furosemide (LASIX) 20 MG tablet TAKE 1 TABLET IN THE MORNING.  Marland Kitchen ipratropium (ATROVENT) 0.06 % nasal spray USE 2 SPRAYS IN EACH NOSTRIL 3 TIMES A DAY AS NEEDED.  Marland Kitchen irbesartan (AVAPRO) 300 MG tablet TAKE 1 TABLET BY MOUTH DAILY.  Marland Kitchen LACTASE PO Take 1 tablet by mouth as needed (when eating dairy products). Take as directed  . LUMIGAN 0.01 % SOLN Place 1 drop into both eyes 2 (two) times daily.   . meloxicam (MOBIC) 7.5 MG tablet meloxicam 7.5 mg tablet  . Multiple Vitamins-Minerals (MULTIVITAMIN WITH MINERALS) tablet Take 1 tablet by mouth daily.  Mckinley Jewel  Dimesylate (RHOPRESSA) 0.02 % SOLN Apply 1 drop to eye at bedtime. Right eye  . Probiotic Product (ALIGN) 4 MG CAPS Take by mouth daily.  . rosuvastatin (CRESTOR) 5 MG tablet TAKE 1 TABLET ONCE DAILY FOR CHOLESTEROL.  . SYNTHROID 125 MCG tablet TAKE 1 TABLET ONCE DAILY.  Marland Kitchen UNABLE TO FIND Take by mouth. Med Name: OTC purified peppermint oil for IBS - 3 caps before lunch and dinner  . [EXPIRED] denosumab (PROLIA) injection 60 mg    No facility-administered encounter medications on file as of 03/18/2020.    Review of Systems:  Review of Systems  Constitutional: Negative  for chills, fever and malaise/fatigue.  HENT: Negative for congestion and sore throat.   Eyes:       Right eye glaucoma, uses reading glasses  Respiratory: Negative for cough and shortness of breath.   Cardiovascular: Negative for chest pain, palpitations and leg swelling.  Gastrointestinal: Negative for abdominal pain, blood in stool, constipation, diarrhea and melena.  Genitourinary: Negative for dysuria.  Musculoskeletal: Positive for back pain, joint pain and myalgias. Negative for falls.  Skin: Negative for itching and rash.  Neurological: Negative for dizziness, sensory change, speech change, focal weakness and loss of consciousness.  Psychiatric/Behavioral: Positive for memory loss. Negative for depression. The patient is not nervous/anxious and does not have insomnia.        Reports some memory loss    Health Maintenance  Topic Date Due  . PNA vac Low Risk Adult (2 of 2 - PCV13) 06/25/2020 (Originally 08/29/2014)  . INFLUENZA VACCINE  03/29/2020  . TETANUS/TDAP  06/19/2022  . DEXA SCAN  Completed  . COVID-19 Vaccine  Completed    Physical Exam: Vitals:   03/18/20 1335  BP: 120/78  Pulse: 68  Temp: (!) 97.2 F (36.2 C)  SpO2: 97%  Weight: 140 lb (63.5 kg)  Height: _0  (1.6 m)   Body mass index is 24.8 kg/m. Physical Exam Vitals reviewed.  Constitutional:      General: She is not in acute  distress.    Appearance: Normal appearance. She is not toxic-appearing.  HENT:     Head: Normocephalic and atraumatic.  Neck:     Vascular: No carotid bruit.  Cardiovascular:     Rate and Rhythm: Normal rate and regular rhythm.     Pulses: Normal pulses.     Heart sounds: Normal heart sounds.  Pulmonary:     Effort: Pulmonary effort is normal.     Breath sounds: Normal breath sounds. No wheezing, rhonchi or rales.  Abdominal:     General: Bowel sounds are normal.  Musculoskeletal:        General: Normal range of motion.     Cervical back: Neck supple.     Right lower leg: Edema present.     Left lower leg: Edema present.     Comments: Nonpitting; wearing brace for back pain, came with cane today, pes planus  Skin:    General: Skin is warm and dry.  Neurological:     General: No focal deficit present.     Mental Status: She is alert and oriented to person, place, and time.  Psychiatric:        Mood and Affect: Mood normal.        Behavior: Behavior normal.        Thought Content: Thought content normal.        Judgment: Judgment normal.     Labs reviewed: Basic Metabolic Panel: Recent Labs    05/14/19 0000 07/12/19 1303  NA 145 144  K 4.1 4.4  CL  --  105  CO2  --  26  GLUCOSE  --  104*  BUN 13 20  CREATININE 0.6 0.59  CALCIUM  --  10.5*  TSH 2.30  --    Liver Function Tests: Recent Labs    05/14/19 0000  AST 22  ALT 18  ALKPHOS 87   No results for input(s): LIPASE, AMYLASE in the last 8760 hours. No results for input(s): AMMONIA in the last 8760 hours. CBC: Recent Labs    05/14/19 0000 07/12/19 1303  WBC  6.7 7.9  NEUTROABS  --  5.0  HGB 12.8 12.8  HCT 39 40.0  MCV  --  89  PLT 230 241   Lipid Panel: Recent Labs    05/14/19 0000  CHOL 143  HDL 65  LDLCALC 53  TRIG 127   Lab Results  Component Value Date   HGBA1C 5.6 05/14/2019    Procedures since last visit: [place]  Result Date: 03/04/2020 Pacemaker  check in clinic. Normal device function. Thresholds, sensing, impedances consistent with previous measurements. Device programmed to maximize longevity. No mode switch or high ventricular rates noted. Device programmed at appropriate safety margins. Histogram distribution appropriate for patient activity level. Device programmed to optimize intrinsic conduction. Estimated longevity 7 years. Patient enrolled in remote follow-up, next check scheduled 04/08/20.  ROV in 1 year with APP. Patient education completed.Trena Platt, BSN, RN   Assessment/Plan 1. Closed compression fracture of L3 lumbar vertebra, sequela -s/p kyphoplasty with Dr. Ellene Route with some improvement, but still has pain related more to #2 -encouraged resuming exercise   2. Kyphoscoliosis -causing her pain, using back brace, rollator walker per PT recs for day to day (cannot fold and put in her car as unable to lift it--suggested lightweight walker for this purpose) -get back to water walking which helped in the past  3. Senile osteoporosis -we will check on evenity with her insurance since she had a compression fx while on prolia which she's been on "since it came out"--was on it with [facility] before she transferred her care to me at [place] -just had prolia today so would get evenity first in 6 mos  4. Pes planus of both feet -f/u about orthotics due to some struggles with current   5. Venous insufficiency of both lower extremities -continues with pharmacy compression stockings; gets swelling by end of day nonetheless  6. Hypothyroidism due to acquired atrophy of thyroid -cont current levothyroxine and f/u tsh next week  7. Hyperglycemia -has been eating more and exercising less since covid -f/u hba1c  Labs/tests ordered:  Cbc with diff, cmp with gfr, flp, hba1c, tsh; get evenity authorized due to compression fx while on prolia Next appt:  06/30/2020  Porshea Janowski L. Tere Mcconaughey, D.O. [facility] [address], [place] [address] Cell Phone (Mon-Fri 8am-5pm):  [phone] On Call:  [phone] & follow prompts after 5pm & weekends Office Phone:  [phone] Office Fax:  [phone]

## 2020-03-19 NOTE — Telephone Encounter (Signed)
I filled  out paper work and gave it to [employer]

## 2020-03-19 NOTE — Telephone Encounter (Signed)
Received & submitted to [employer] for auth 03/19/20  Thanks, Vilinda Blanks.

## 2020-03-27 DIAGNOSIS — H5310 Unspecified subjective visual disturbances: Secondary | ICD-10-CM | POA: Diagnosis not present

## 2020-03-27 DIAGNOSIS — H53413 Scotoma involving central area, bilateral: Secondary | ICD-10-CM | POA: Diagnosis not present

## 2020-04-01 ENCOUNTER — Other Ambulatory Visit: Payer: Self-pay | Admitting: Internal Medicine

## 2020-04-08 ENCOUNTER — Ambulatory Visit (INDEPENDENT_AMBULATORY_CARE_PROVIDER_SITE_OTHER): Payer: Medicare Other | Admitting: *Deleted

## 2020-04-08 DIAGNOSIS — I441 Atrioventricular block, second degree: Secondary | ICD-10-CM

## 2020-04-08 LAB — CUP PACEART REMOTE DEVICE CHECK
Battery Remaining Longevity: 75 mo
Battery Voltage: 3.01 V
Brady Statistic AP VP Percent: 6.03 %
Brady Statistic AP VS Percent: 7.65 %
Brady Statistic AS VP Percent: 51.56 %
Brady Statistic AS VS Percent: 34.76 %
Brady Statistic RA Percent Paced: 13.56 %
Brady Statistic RV Percent Paced: 57.22 %
Date Time Interrogation Session: 20210811110644
Implantable Lead Implant Date: 20171219
Implantable Lead Implant Date: 20171219
Implantable Lead Location: 753859
Implantable Lead Location: 753860
Implantable Lead Model: 5076
Implantable Lead Model: 5076
Implantable Pulse Generator Implant Date: 20171219
Lead Channel Impedance Value: 285 Ohm
Lead Channel Impedance Value: 323 Ohm
Lead Channel Impedance Value: 342 Ohm
Lead Channel Impedance Value: 418 Ohm
Lead Channel Pacing Threshold Amplitude: 0.625 V
Lead Channel Pacing Threshold Amplitude: 1.375 V
Lead Channel Pacing Threshold Pulse Width: 0.4 ms
Lead Channel Pacing Threshold Pulse Width: 0.4 ms
Lead Channel Sensing Intrinsic Amplitude: 2.5 mV
Lead Channel Sensing Intrinsic Amplitude: 2.5 mV
Lead Channel Sensing Intrinsic Amplitude: 7 mV
Lead Channel Sensing Intrinsic Amplitude: 7 mV
Lead Channel Setting Pacing Amplitude: 2 V
Lead Channel Setting Pacing Amplitude: 2.75 V
Lead Channel Setting Pacing Pulse Width: 0.4 ms
Lead Channel Setting Sensing Sensitivity: 2.8 mV

## 2020-04-13 NOTE — Progress Notes (Signed)
Remote pacemaker transmission.   

## 2020-04-14 ENCOUNTER — Other Ambulatory Visit: Payer: Self-pay | Admitting: Internal Medicine

## 2020-04-16 ENCOUNTER — Encounter: Payer: Self-pay | Admitting: Internal Medicine

## 2020-04-16 DIAGNOSIS — N390 Urinary tract infection, site not specified: Secondary | ICD-10-CM | POA: Diagnosis not present

## 2020-04-16 DIAGNOSIS — Z79899 Other long term (current) drug therapy: Secondary | ICD-10-CM | POA: Diagnosis not present

## 2020-04-16 DIAGNOSIS — R300 Dysuria: Secondary | ICD-10-CM | POA: Diagnosis not present

## 2020-04-17 DIAGNOSIS — N390 Urinary tract infection, site not specified: Secondary | ICD-10-CM | POA: Diagnosis not present

## 2020-04-22 ENCOUNTER — Encounter: Payer: Self-pay | Admitting: Internal Medicine

## 2020-04-22 ENCOUNTER — Telehealth: Payer: Self-pay

## 2020-04-22 NOTE — Telephone Encounter (Signed)
Taking bactrim from urgent care. Need final CX report

## 2020-04-27 ENCOUNTER — Other Ambulatory Visit: Payer: Self-pay | Admitting: Internal Medicine

## 2020-04-30 DIAGNOSIS — Z961 Presence of intraocular lens: Secondary | ICD-10-CM | POA: Diagnosis not present

## 2020-04-30 DIAGNOSIS — H401122 Primary open-angle glaucoma, left eye, moderate stage: Secondary | ICD-10-CM | POA: Diagnosis not present

## 2020-04-30 DIAGNOSIS — H401113 Primary open-angle glaucoma, right eye, severe stage: Secondary | ICD-10-CM | POA: Diagnosis not present

## 2020-05-27 DIAGNOSIS — M25561 Pain in right knee: Secondary | ICD-10-CM | POA: Diagnosis not present

## 2020-06-02 ENCOUNTER — Other Ambulatory Visit: Payer: Self-pay

## 2020-06-02 ENCOUNTER — Encounter: Payer: Self-pay | Admitting: Family

## 2020-06-02 ENCOUNTER — Ambulatory Visit (INDEPENDENT_AMBULATORY_CARE_PROVIDER_SITE_OTHER): Payer: Medicare Other | Admitting: Family

## 2020-06-02 VITALS — BP 132/70 | HR 72 | Temp 96.6°F | Resp 16 | Ht 63.0 in | Wt 133.8 lb

## 2020-06-02 DIAGNOSIS — J3489 Other specified disorders of nose and nasal sinuses: Secondary | ICD-10-CM

## 2020-06-02 DIAGNOSIS — J0101 Acute recurrent maxillary sinusitis: Secondary | ICD-10-CM

## 2020-06-02 DIAGNOSIS — H9209 Otalgia, unspecified ear: Secondary | ICD-10-CM | POA: Diagnosis not present

## 2020-06-02 MED ORDER — LORATADINE 10 MG PO TABS: 10.0000 mg | ORAL_TABLET | Freq: Every day | ORAL | 11 refills | Status: DC

## 2020-06-02 MED ORDER — AZITHROMYCIN 250 MG PO TABS: ORAL_TABLET | ORAL | 0 refills | Status: DC

## 2020-06-02 NOTE — Patient Instructions (Signed)
Continue on Hemeopathic ear drops  2 drops twice daily

## 2020-06-02 NOTE — Progress Notes (Signed)
Provider: Marijane Trower FNP-C  Gayland Curry, DO  Patient Care Team: Gayland Curry, DO as PCP - General (Geriatric Medicine) Thompson Grayer, MD as PCP - Electrophysiology (Cardiology) Deland Pretty, MD (Internal Medicine) Ronald Lobo, MD as Consulting Physician (Gastroenterology) Jari Pigg, MD as Consulting Physician (Dermatology) Shon Hough, MD as Consulting Physician (Ophthalmology) Kristeen Miss, MD as Consulting Physician (Neurosurgery) Justice Britain, MD as Consulting Physician (Orthopedic Surgery) Gaynelle Arabian, MD as Consulting Physician (Orthopedic Surgery) Suella Broad, MD as Consulting Physician (Physical Medicine and Rehabilitation)  Extended Emergency Contact Information Primary Emergency Contact: Marcie Mowers, [place] Phone: [phone] Mobile Phone: [phone] Relation: Daughter Secondary Emergency Contact: Ollie, [place] [address] Johnnette Litter of [place] Phone: [phone] Relation: Son  Code Status:  DNR Goals of care: Advanced Directive information Advanced Directives 06/02/2020  Does Patient Have a Medical Advance Directive? Yes  Type of Advance Directive [place]  Does patient want to make changes to medical advance directive? No - Patient declined  Copy of [place] in Chart? Yes - validated most recent copy scanned in chart (See row information)  Pre-existing out of facility DNR order (yellow form or pink MOST form) -     Chief Complaint  Patient presents with   Acute Visit    Right Earache x 1 week.     HPI:  Pt is a 86 y.o. female seen today for an acute visit for evaluation of right ear ache x 1 week.Has had sinus drainage worst in the morning.has used nasal spray.she denies any fever,chills or cough.   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 Cristina Gong   High cholesterol    HTN (hypertension) 07/17/2015   Echo 08/2019: EF 70-01, grade 1 diastolic dysfunction, mild apical HK, normal RV SF, trivial pericardial effusion, moderate MAC, mild MR, mild TR   Hypothyroidism    Insomnia    Lactose intolerance    Lumbar degenerative disc disease    Myoview    Myoview 06/2019: EF 48, normal perfusion; low risk (EF normal by echo 10/2018)   Onychomycosis    Osteoarthritis    Osteoporosis    Second degree Mobitz II AV block    s/p PPM   Spinal stenosis    Thyroid disease    Varicose veins    Past Surgical History:  Procedure Laterality Date   ABDOMINAL HYSTERECTOMY     BLADDER REPAIR     blephoplasty bilateral  03/10/2014   CATARACT EXTRACTION W/ INTRAOCULAR LENS  IMPLANT, BILATERAL  7 & 8 2014   CHOLECYSTECTOMY     COLONOSCOPY  05/18/2012   EP IMPLANTABLE DEVICE N/A 08/16/2016   MDT [employer] MRI conditional PPM implanted by Dr Rayann Heman for mobitz II second degree AV block   EYE SURGERY Right 05/02/2018   laser eye surgery   herniated disc repair     INCONTINENCE SURGERY     JOINT REPLACEMENT  LAMINECTOMY  02/2011   L3-L5  Dr. Ellene Route   LUMBAR LAMINECTOMY/DECOMPRESSION MICRODISCECTOMY Right 09/21/2015   Procedure: Right Lumbar Three-FourMicrodiskectomy;  Surgeon: Kristeen Miss, MD;  Location: [facility];  Service: Neurosurgery;  Laterality: Right;  Right L3-4 Microdiskectomy   SHOULDER ARTHROSCOPY WITH ROTATOR CUFF REPAIR Right 08/2003   TONSILLECTOMY     TOTAL HIP ARTHROPLASTY     right 1999    Allergies  Allergen Reactions   Codeine Nausea Only   Macrobid [Nitrofurantoin] Diarrhea   Miralax [Polyethylene Glycol] Other (See  Comments)    cramping   Pain Relief [Acetaminophen] Nausea And Vomiting   Paxil [Paroxetine Hcl] Nausea Only   Percodan [Oxycodone-Aspirin] Itching   Oxycodone Nausea Only   Oxycodone-Acetaminophen Nausea Only   Penicillins Rash    Has patient had a PCN reaction causing immediate rash, facial/tongue/throat swelling, SOB or lightheadedness with hypotension: YES Has patient had a PCN reaction causing severe rash involving mucus membranes or skin necrosis: YES Has patient had a PCN reaction that required hospitalization NO Has patient had a PCN reaction occurring within the last 10 years: NO If all of the above answers are "NO", then may proceed with Cephalosporin use.    Outpatient Encounter Medications as of 06/02/2020  Medication Sig   Calcium-Phosphorus-Vitamin D (CITRACAL +D3) 250-107-500 MG-MG-UNIT CHEW Chew 2 tablets by mouth daily.    Cyanocobalamin (VITAMIN B 12 PO) Take 1 tablet by mouth daily.   denosumab (PROLIA) 60 MG/ML SOSY injection Inject 60 mg into the skin every 6 (six) months.   diltiazem (CARDIZEM CD) 240 MG 24 hr capsule TAKE 1 CAPSULE EVERY DAY.   dorzolamide-timolol (COSOPT) 22.3-6.8 MG/ML ophthalmic solution Place 1 drop into both eyes 2 (two) times daily.   furosemide (LASIX) 20 MG tablet TAKE 1 TABLET IN THE MORNING.   ipratropium (ATROVENT) 0.06 % nasal spray USE 2 SPRAYS IN EACH NOSTRIL 3 TIMES A DAY AS NEEDED.   irbesartan (AVAPRO) 300 MG tablet TAKE 1 TABLET BY MOUTH DAILY.   LACTASE PO Take 1 tablet by mouth as needed (when eating dairy products). Take as directed   LUMIGAN 0.01 % SOLN Place 1 drop into both eyes 2 (two) times daily.    Multiple Vitamins-Minerals (MULTIVITAMIN WITH MINERALS) tablet Take 1 tablet by mouth daily.   Netarsudil Dimesylate (RHOPRESSA) 0.02 % SOLN Apply 1 drop to eye at bedtime. Right eye   Probiotic Product (ALIGN) 4 MG CAPS Take by mouth daily.   rosuvastatin (CRESTOR) 5 MG tablet TAKE 1 TABLET ONCE DAILY FOR  CHOLESTEROL.   SYNTHROID 125 MCG tablet TAKE 1 TABLET ONCE DAILY.   UNABLE TO FIND Take by mouth. Med Name: OTC purified peppermint oil for IBS - 3 caps before lunch and dinner   No facility-administered encounter medications on file as of 06/02/2020.    Review of Systems  Constitutional: Negative for appetite change, chills, fatigue and fever.  HENT: Positive for ear pain, rhinorrhea, sinus pressure, sinus pain and sneezing. Negative for congestion, ear discharge, hearing loss, postnasal drip, sore throat and trouble swallowing.   Respiratory: Negative for cough, chest tightness, shortness of breath and wheezing.   Skin: Negative for color change, pallor and rash.  Neurological: Negative for dizziness, speech difficulty, light-headedness and headaches.    Immunization History  Administered Date(s) Administered   DTaP 06/19/2012   Influenza,inj,Quad PF,6+ Mos 05/12/2016, 06/22/2018, 05/07/2019   Influenza-Unspecified 08/29/2014, 06/19/2017   Moderna SARS-COVID-2 Vaccination 09/07/2019, 10/10/2019   Pneumococcal Polysaccharide-23 12/11/1999   Pneumococcal-Unspecified 08/29/2013  Zoster 06/24/2006   Zoster Recombinat (Shingrix) 03/30/2017, 06/16/2017   Pertinent  Health Maintenance Due  Topic Date Due   INFLUENZA VACCINE  03/29/2020   PNA vac Low Risk Adult (2 of 2 - PCV13) 06/25/2020 (Originally 08/29/2014)   DEXA SCAN  Completed   Fall Risk  06/02/2020 03/18/2020 10/30/2019 06/26/2019 05/22/2019  Falls in the past year? 0 0 0 0 0  Number falls in past yr: 0 0 0 - 0  Injury with Fall? 0 0 0 - 0   Functional Status Survey:    Vitals:   06/02/20 1528  BP: 132/70  Pulse: 72  Resp: 16  Temp: (!) 96.6 F (35.9 C)  SpO2: 97%  Weight: 133 lb 12.8 oz (60.7 kg)  Height: _0  (1.6 m)   Body mass index is 23.7 kg/m. Physical Exam Vitals reviewed.  Constitutional:      General: She is not in acute distress.    Appearance: She is normal weight. She is not  ill-appearing.  HENT:     Head: Normocephalic.     Right Ear: Tympanic membrane, ear canal and external ear normal. There is no impacted cerumen.     Left Ear: Tympanic membrane, ear canal and external ear normal. There is no impacted cerumen.     Nose: Rhinorrhea present. No congestion.     Right Turbinates: Not enlarged, swollen or pale.     Left Turbinates: Not enlarged, swollen or pale.     Right Sinus: Maxillary sinus tenderness present. No frontal sinus tenderness.     Left Sinus: Maxillary sinus tenderness present. No frontal sinus tenderness.     Mouth/Throat:     Mouth: Mucous membranes are moist.     Pharynx: Oropharynx is clear. No oropharyngeal exudate or posterior oropharyngeal erythema.  Neck:     Vascular: No carotid bruit.  Cardiovascular:     Rate and Rhythm: Normal rate and regular rhythm.     Pulses: Normal pulses.     Heart sounds: Normal heart sounds. No murmur heard.  No friction rub. No gallop.   Pulmonary:     Effort: Pulmonary effort is normal. No respiratory distress.     Breath sounds: Normal breath sounds. No wheezing, rhonchi or rales.  Chest:     Chest wall: No tenderness.  Abdominal:     General: Bowel sounds are normal. There is no distension.     Palpations: Abdomen is soft. There is no mass.     Tenderness: There is no abdominal tenderness. There is no right CVA tenderness, left CVA tenderness, guarding or rebound.  Musculoskeletal:     Cervical back: Normal range of motion. No tenderness.  Lymphadenopathy:     Cervical: No cervical adenopathy.  Skin:    General: Skin is warm.     Coloration: Skin is not pale.     Findings: No bruising, erythema or rash.  Neurological:     Mental Status: She is alert and oriented to person, place, and time.     Cranial Nerves: No cranial nerve deficit.     Sensory: No sensory deficit.     Motor: No weakness.     Coordination: Coordination normal.     Gait: Gait normal.  Psychiatric:        Mood and  Affect: Mood normal.        Behavior: Behavior normal.        Thought Content: Thought content normal.        Judgment: Judgment normal.  Labs reviewed: Recent Labs    07/12/19 1303  NA 144  K 4.4  CL 105  CO2 26  GLUCOSE 104*  BUN 20  CREATININE 0.59  CALCIUM 10.5*    Recent Labs    07/12/19 1303  WBC 7.9  NEUTROABS 5.0  HGB 12.8  HCT 40.0  MCV 89  PLT 241   Lab Results  Component Value Date   TSH 2.30 05/14/2019   Lab Results  Component Value Date   HGBA1C 5.6 05/14/2019   Lab Results  Component Value Date   CHOL 143 05/14/2019   HDL 65 05/14/2019   LDLCALC 53 05/14/2019   TRIG 127 05/14/2019   CHOLHDL 2.3 01/19/2017    Significant Diagnostic Results in last 30 days:  No results found.  Assessment/Plan 1. Acute recurrent maxillary sinusitis Maxillary tenderness sinusitis. -will start on Azithromycin as below. - azithromycin (ZITHROMAX) 250 MG tablet; Take 2 Tablets by mouth x 1 today then take one by mouth daily x 4 days.  Dispense: 6 tablet; Refill: 0  2. Ear ache Has improved today.TM clear bilateral.  3. Rhinorrhea Advised to take OTC loratadine as below. - loratadine (CLARITIN) 10 MG tablet; Take 1 tablet (10 mg total) by mouth daily.  Dispense: 30 tablet; Refill: 11  Family/ staff Communication: Reviewed plan of care with patient  Labs/tests ordered: None   Next Appointment: As needed if symptoms worsen or fail to improve.  Sandrea Hughs, NP

## 2020-06-23 DIAGNOSIS — M25561 Pain in right knee: Secondary | ICD-10-CM | POA: Diagnosis not present

## 2020-06-24 DIAGNOSIS — Z85828 Personal history of other malignant neoplasm of skin: Secondary | ICD-10-CM | POA: Diagnosis not present

## 2020-06-24 DIAGNOSIS — L821 Other seborrheic keratosis: Secondary | ICD-10-CM | POA: Diagnosis not present

## 2020-06-24 DIAGNOSIS — D225 Melanocytic nevi of trunk: Secondary | ICD-10-CM | POA: Diagnosis not present

## 2020-06-24 DIAGNOSIS — L84 Corns and callosities: Secondary | ICD-10-CM | POA: Diagnosis not present

## 2020-06-24 DIAGNOSIS — L570 Actinic keratosis: Secondary | ICD-10-CM | POA: Diagnosis not present

## 2020-06-24 DIAGNOSIS — D2230 Melanocytic nevi of unspecified part of face: Secondary | ICD-10-CM | POA: Diagnosis not present

## 2020-06-24 DIAGNOSIS — L578 Other skin changes due to chronic exposure to nonionizing radiation: Secondary | ICD-10-CM | POA: Diagnosis not present

## 2020-06-25 DIAGNOSIS — F419 Anxiety disorder, unspecified: Secondary | ICD-10-CM | POA: Diagnosis not present

## 2020-06-25 DIAGNOSIS — E099 Drug or chemical induced diabetes mellitus without complications: Secondary | ICD-10-CM | POA: Diagnosis not present

## 2020-06-25 DIAGNOSIS — E079 Disorder of thyroid, unspecified: Secondary | ICD-10-CM | POA: Diagnosis not present

## 2020-06-25 DIAGNOSIS — E7800 Pure hypercholesterolemia, unspecified: Secondary | ICD-10-CM | POA: Diagnosis not present

## 2020-06-25 DIAGNOSIS — K5730 Diverticulosis of large intestine without perforation or abscess without bleeding: Secondary | ICD-10-CM | POA: Diagnosis not present

## 2020-06-25 LAB — CBC AND DIFFERENTIAL
HCT: 41 (ref 36–46)
Hemoglobin: 13.6 (ref 12.0–16.0)
Platelets: 214 (ref 150–399)
WBC: 6.1

## 2020-06-25 LAB — HEPATIC FUNCTION PANEL
ALT: 19 (ref 7–35)
AST: 21 (ref 13–35)

## 2020-06-25 LAB — COMPREHENSIVE METABOLIC PANEL
Albumin: 4.3 (ref 3.5–5.0)
Calcium: 9.7 (ref 8.7–10.7)

## 2020-06-25 LAB — LIPID PANEL
Cholesterol: 163 (ref 0–200)
HDL: 76 — AB (ref 35–70)
Triglycerides: 108 (ref 40–160)

## 2020-06-25 LAB — CBC: RBC: 4.45 (ref 3.87–5.11)

## 2020-06-25 LAB — HEMOGLOBIN A1C: Hemoglobin A1C: 5.6

## 2020-06-25 LAB — BASIC METABOLIC PANEL
BUN: 15 (ref 4–21)
CO2: 25 — AB (ref 13–22)
Chloride: 106 (ref 99–108)
Creatinine: 0.5 (ref 0.5–1.1)
Glucose: 95
Potassium: 4.2 (ref 3.4–5.3)
Sodium: 141 (ref 137–147)

## 2020-06-25 LAB — TSH: TSH: 0.93 (ref 0.41–5.90)

## 2020-06-29 ENCOUNTER — Encounter: Payer: Medicare Other | Admitting: Family

## 2020-06-29 DIAGNOSIS — M79671 Pain in right foot: Secondary | ICD-10-CM | POA: Diagnosis not present

## 2020-06-29 DIAGNOSIS — M67961 Unspecified disorder of synovium and tendon, right lower leg: Secondary | ICD-10-CM | POA: Diagnosis not present

## 2020-06-29 DIAGNOSIS — M79672 Pain in left foot: Secondary | ICD-10-CM | POA: Diagnosis not present

## 2020-06-29 DIAGNOSIS — M67962 Unspecified disorder of synovium and tendon, left lower leg: Secondary | ICD-10-CM | POA: Diagnosis not present

## 2020-06-30 ENCOUNTER — Ambulatory Visit (INDEPENDENT_AMBULATORY_CARE_PROVIDER_SITE_OTHER): Payer: Medicare Other | Admitting: Family

## 2020-06-30 ENCOUNTER — Other Ambulatory Visit: Payer: Self-pay

## 2020-06-30 ENCOUNTER — Telehealth: Payer: Self-pay

## 2020-06-30 ENCOUNTER — Encounter: Payer: Self-pay | Admitting: Family

## 2020-06-30 DIAGNOSIS — Z0000 Encounter for general adult medical examination without abnormal findings: Secondary | ICD-10-CM | POA: Diagnosis not present

## 2020-06-30 NOTE — Patient Instructions (Signed)
[name] , Thank you for taking time to come for your Medicare Wellness Visit. I appreciate your ongoing commitment to your health goals. Please review the following plan we discussed and let me know if I can assist you in the future.   Screening recommendations/referrals: Colonoscopy ; N/a  Mammogram: Up to date  Bone Density : Up to date  Recommended yearly ophthalmology/optometry visit for glaucoma screening and checkup Recommended yearly dental visit for hygiene and checkup  Vaccinations: Influenza vaccine: Up to date  Pneumococcal vaccine : Up to date  Tdap vaccine : Up to date  Shingles vaccine: Up to date    Advanced directives: Yes   Conditions/risks identified: Advance Age female > 84 yrs,Hypertension   Next appointment: 1 year    Preventive Care 84 Years and Older, Female Preventive care refers to lifestyle choices and visits with your health care provider that can promote health and wellness. What does preventive care include?  A yearly physical exam. This is also called an annual well check.  Dental exams once or twice a year.  Routine eye exams. Ask your health care provider how often you should have your eyes checked.  Personal lifestyle choices, including:  Daily care of your teeth and gums.  Regular physical activity.  Eating a healthy diet.  Avoiding tobacco and drug use.  Limiting alcohol use.  Practicing safe sex.  Taking low-dose aspirin every day.  Taking vitamin and mineral supplements as recommended by your health care provider. What happens during an annual well check? The services and screenings done by your health care provider during your annual well check will depend on your age, overall health, lifestyle risk factors, and family history of disease. Counseling  Your health care provider may ask you questions about your:  Alcohol use.  Tobacco use.  Drug use.  Emotional well-being.  Home and relationship well-being.  Sexual  activity.  Eating habits.  History of falls.  Memory and ability to understand (cognition).  Work and work Statistician.  Reproductive health. Screening  You may have the following tests or measurements:  Height, weight, and BMI.  Blood pressure.  Lipid and cholesterol levels. These may be checked every 5 years, or more frequently if you are over 42 years old.  Skin check.  Lung cancer screening. You may have this screening every year starting at age 72 if you have a 30-pack-year history of smoking and currently smoke or have quit within the past 15 years.  Fecal occult blood test (FOBT) of the stool. You may have this test every year starting at age 21.  Flexible sigmoidoscopy or colonoscopy. You may have a sigmoidoscopy every 5 years or a colonoscopy every 10 years starting at age 49.  Hepatitis C blood test.  Hepatitis B blood test.  Sexually transmitted disease (STD) testing.  Diabetes screening. This is done by checking your blood sugar (glucose) after you have not eaten for a while (fasting). You may have this done every 1-3 years.  Bone density scan. This is done to screen for osteoporosis. You may have this done starting at age 62.  Mammogram. This may be done every 1-2 years. Talk to your health care provider about how often you should have regular mammograms. Talk with your health care provider about your test results, treatment options, and if necessary, the need for more tests. Vaccines  Your health care provider may recommend certain vaccines, such as:  Influenza vaccine. This is recommended every year.  Tetanus, diphtheria, and  acellular pertussis (Tdap, Td) vaccine. You may need a Td booster every 10 years.  Zoster vaccine. You may need this after age 75.  Pneumococcal 13-valent conjugate (PCV13) vaccine. One dose is recommended after age 69.  Pneumococcal polysaccharide (PPSV23) vaccine. One dose is recommended after age 61. Talk to your health care  provider about which screenings and vaccines you need and how often you need them. This information is not intended to replace advice given to you by your health care provider. Make sure you discuss any questions you have with your health care provider. Document Released: 09/11/2015 Document Revised: 05/04/2016 Document Reviewed: 06/16/2015 Elsevier Interactive Patient Education  2017 [employer] Prevention in the Home Falls can cause injuries. They can happen to people of all ages. There are many things you can do to make your home safe and to help prevent falls. What can I do on the outside of my home?  Regularly fix the edges of walkways and driveways and fix any cracks.  Remove anything that might make you trip as you walk through a door, such as a raised step or threshold.  Trim any bushes or trees on the path to your home.  Use bright outdoor lighting.  Clear any walking paths of anything that might make someone trip, such as rocks or tools.  Regularly check to see if handrails are loose or broken. Make sure that both sides of any steps have handrails.  Any raised decks and porches should have guardrails on the edges.  Have any leaves, snow, or ice cleared regularly.  Use sand or salt on walking paths during winter.  Clean up any spills in your garage right away. This includes oil or grease spills. What can I do in the bathroom?  Use night lights.  Install grab bars by the toilet and in the tub and shower. Do not use towel bars as grab bars.  Use non-skid mats or decals in the tub or shower.  If you need to sit down in the shower, use a plastic, non-slip stool.  Keep the floor dry. Clean up any water that spills on the floor as soon as it happens.  Remove soap buildup in the tub or shower regularly.  Attach bath mats securely with double-sided non-slip rug tape.  Do not have throw rugs and other things on the floor that can make you trip. What can I do in  the bedroom?  Use night lights.  Make sure that you have a light by your bed that is easy to reach.  Do not use any sheets or blankets that are too big for your bed. They should not hang down onto the floor.  Have a firm chair that has side arms. You can use this for support while you get dressed.  Do not have throw rugs and other things on the floor that can make you trip. What can I do in the kitchen?  Clean up any spills right away.  Avoid walking on wet floors.  Keep items that you use a lot in easy-to-reach places.  If you need to reach something above you, use a strong step stool that has a grab bar.  Keep electrical cords out of the way.  Do not use floor polish or wax that makes floors slippery. If you must use wax, use non-skid floor wax.  Do not have throw rugs and other things on the floor that can make you trip. What can I do with my stairs?  Do not leave any items on the stairs.  Make sure that there are handrails on both sides of the stairs and use them. Fix handrails that are broken or loose. Make sure that handrails are as long as the stairways.  Check any carpeting to make sure that it is firmly attached to the stairs. Fix any carpet that is loose or worn.  Avoid having throw rugs at the top or bottom of the stairs. If you do have throw rugs, attach them to the floor with carpet tape.  Make sure that you have a light switch at the top of the stairs and the bottom of the stairs. If you do not have them, ask someone to add them for you. What else can I do to help prevent falls?  Wear shoes that:  Do not have high heels.  Have rubber bottoms.  Are comfortable and fit you well.  Are closed at the toe. Do not wear sandals.  If you use a stepladder:  Make sure that it is fully opened. Do not climb a closed stepladder.  Make sure that both sides of the stepladder are locked into place.  Ask someone to hold it for you, if possible.  Clearly mark and  make sure that you can see:  Any grab bars or handrails.  First and last steps.  Where the edge of each step is.  Use tools that help you move around (mobility aids) if they are needed. These include:  Canes.  Walkers.  Scooters.  Crutches.  Turn on the lights when you go into a dark area. Replace any light bulbs as soon as they burn out.  Set up your furniture so you have a clear path. Avoid moving your furniture around.  If any of your floors are uneven, fix them.  If there are any pets around you, be aware of where they are.  Review your medicines with your doctor. Some medicines can make you feel dizzy. This can increase your chance of falling. Ask your doctor what other things that you can do to help prevent falls. This information is not intended to replace advice given to you by your health care provider. Make sure you discuss any questions you have with your health care provider. Document Released: 06/11/2009 Document Revised: 01/21/2016 Document Reviewed: 09/19/2014 Elsevier Interactive Patient Education  2017 [employer].

## 2020-06-30 NOTE — Progress Notes (Signed)
This service is provided via telemedicine  No vital signs collected/recorded due to the encounter was a telemedicine visit.   Location of patient (ex: home, work): Home.  Patient consents to a telephone visit: Yes.   Location of the provider (ex: office, home):  [facility].   Name of any referring provider: Gayland Curry, DO   Names of all persons participating in the telemedicine service and their role in the encounter: Patient, Destiny [name], [place], [place], Webb Silversmith, NP.    Time spent on call: 8 minutes spent on the phone with Medical Assistant.      Subjective:   Destiny [name] is a 86 y.o. female who presents for Medicare Annual (Subsequent) preventive examination.  Review of Systems     Cardiac Risk Factors include: advanced age (>18mn, >>50women);hypertension     Objective:    Today's Vitals   06/30/20 1513  PainSc: 4    There is no height or weight on file to calculate BMI.  Advanced Directives 06/30/2020 06/02/2020 03/18/2020 10/30/2019 06/26/2019 05/22/2019 05/08/2018  Does Patient Have a Medical Advance Directive? _0  Yes Yes  Type of AIndustrial/product designerof ATiftOut of facility DNR (pink MOST or yellow form) Out of facility DNR (pink MOST or yellow form) Out of facility DNR (pink MOST or yellow form) Healthcare Power of A[place]amo Does patient want to make changes to medical advance directive? No - Patient declined No - Patient declined No - Patient declined No - Patient declined No - Patient declined No - Patient declined No - Patient declined  Copy of [place]in Chart? Yes - validated most recent copy scanned in chart (See row information) Yes - validated most recent copy scanned in chart (See row information) Yes - validated most recent copy scanned in chart (See row information) - No - copy requested Yes - validated  most recent copy scanned in chart (See row information) Yes  Pre-existing out of facility DNR order (yellow form or pink MOST form) - - Pink MOST/Yellow Form most recent copy in chart - Physician notified to receive inpatient order - Yellow form placed in chart (order not valid for inpatient use) - -    Current Medications (verified) Outpatient Encounter Medications as of 06/30/2020  Medication Sig  . Calcium-Phosphorus-Vitamin D (CITRACAL +D3) 250-107-500 MG-MG-UNIT CHEW Chew 2 tablets by mouth daily.   . Cyanocobalamin (VITAMIN B 12 PO) Take 1 tablet by mouth daily.  .Marland Kitchendenosumab (PROLIA) 60 MG/ML SOSY injection Inject 60 mg into the skin every 6 (six) months.  . diltiazem (CARDIZEM CD) 240 MG 24 hr capsule TAKE 1 CAPSULE EVERY DAY.  .Marland Kitchendorzolamide-timolol (COSOPT) 22.3-6.8 MG/ML ophthalmic solution Place 1 drop into both eyes 2 (two) times daily.  . furosemide (LASIX) 20 MG tablet TAKE 1 TABLET IN THE MORNING.  .Marland Kitchenipratropium (ATROVENT) 0.06 % nasal spray USE 2 SPRAYS IN EACH NOSTRIL 3 TIMES A DAY AS NEEDED.  .Marland Kitchenirbesartan (AVAPRO) 300 MG tablet TAKE 1 TABLET BY MOUTH DAILY.  .Marland KitchenLACTASE PO Take 1 tablet by mouth as needed (when eating dairy products). Take as directed  . loratadine (CLARITIN) 10 MG tablet Take 1 tablet (10 mg total) by mouth daily.  .Marland KitchenLUMIGAN 0.01 % SOLN Place 1 drop into both eyes 2 (two) times daily.   . Multiple Vitamins-Minerals (MULTIVITAMIN WITH MINERALS) tablet Take 1 tablet  by mouth daily.  Mckinley Jewel Dimesylate (RHOPRESSA) 0.02 % SOLN Apply 1 drop to eye at bedtime. Right eye  . Probiotic Product (ALIGN) 4 MG CAPS Take by mouth daily.  . rosuvastatin (CRESTOR) 5 MG tablet TAKE 1 TABLET ONCE DAILY FOR CHOLESTEROL.  . SYNTHROID 125 MCG tablet TAKE 1 TABLET ONCE DAILY.  Marland Kitchen UNABLE TO FIND Take by mouth as needed. Med Name: OTC purified peppermint oil for IBS - 3 caps before lunch and dinner   . UNABLE TO FIND Med Name:Hemeopathic ear drops  2 drops twice daily  .  [DISCONTINUED] azithromycin (ZITHROMAX) 250 MG tablet Take 2 Tablets by mouth x 1 today then take one by mouth daily x 4 days.   No facility-administered encounter medications on file as of 06/30/2020.    Allergies (verified) Codeine, Macrobid [nitrofurantoin], Miralax [polyethylene glycol], Pain relief [acetaminophen], Paxil [paroxetine hcl], Percodan [oxycodone-aspirin], Oxycodone, Oxycodone-acetaminophen, and Penicillins   History: Past Medical History:  Diagnosis Date  . Allergy   . Arthritis   . 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 Cataract   . Chronic anxiety   . Constipation   . Diverticulosis of sigmoid colon   . Emphysema of lung ([place])   . GERD (gastroesophageal reflux disease)   . Glaucoma   . H/O adenomatous polyp of colon    Dr. Cristina Gong  . High cholesterol   . HTN (hypertension) 07/17/2015   Echo 08/2019: EF 62-13, grade 1 diastolic dysfunction, mild apical HK, normal RV SF, trivial pericardial effusion, moderate MAC, mild MR, mild TR  . Hypothyroidism   . Insomnia   . Lactose intolerance   . Lumbar degenerative disc disease   . Myoview    Myoview 06/2019: EF 48, normal perfusion; low risk (EF normal by echo 10/2018)  . Neuromuscular disorder (HCC)    Pronated ankles /Feet   . Onychomycosis   . Osteoarthritis   . Osteoporosis   . Second degree Mobitz II AV block    s/p PPM  . Spinal stenosis   . Thyroid disease   . Varicose veins    Past Surgical History:  Procedure Laterality Date  . ABDOMINAL HYSTERECTOMY    . BLADDER REPAIR    . blephoplasty bilateral  03/10/2014  . CATARACT EXTRACTION W/ INTRAOCULAR LENS  IMPLANT, BILATERAL  7 & 8 2014  . CHOLECYSTECTOMY    . COLONOSCOPY  05/18/2012  . EP IMPLANTABLE DEVICE N/A 08/16/2016   MDT Advisa MRI conditional PPM implanted by Dr Rayann Heman for mobitz II second degree AV block  . EYE SURGERY Right 05/02/2018   laser eye surgery  . herniated disc repair    .  INCONTINENCE SURGERY    . JOINT REPLACEMENT    . LAMINECTOMY  02/2011   L3-L5  Dr. Ellene Route  . LUMBAR LAMINECTOMY/DECOMPRESSION MICRODISCECTOMY Right 09/21/2015   Procedure: Right Lumbar Three-FourMicrodiskectomy;  Surgeon: Kristeen Miss, MD;  Location: [facility];  Service: Neurosurgery;  Laterality: Right;  Right L3-4 Microdiskectomy  . SHOULDER ARTHROSCOPY WITH ROTATOR CUFF REPAIR Right 08/2003  . TONSILLECTOMY    . TOTAL HIP ARTHROPLASTY     right 1999   Family History  Problem Relation Age of Onset  . Arthritis Mother   . Osteoporosis Mother   . Heart attack Father    Social History   Socioeconomic History  . Marital status: Widowed    Spouse name: Not on file  . Number of children: Not on file  .  Years of education: Not on file  . Highest education level: Not on file  Occupational History  . Not on file  Tobacco Use  . Smoking status: Never Smoker  . Smokeless tobacco: Never Used  Vaping Use  . Vaping Use: Never used  Substance and Sexual Activity  . Alcohol use: No  . Drug use: No  . Sexual activity: Never  Other Topics Concern  . Not on file  Social History Narrative   Lives at [facility]   Caffeine 1-2 cups daily   Exercise water aerobics, walking   Never smoked   POA               Do you drink/ eat things with caffeine? Yes      Marital status:    Widow                           What year were you married ? 1957      Do you live in a house, apartment,assistred living, condo, trailer, etc.)?[place] [facility]      Is it one or more stories?       How many persons live in your home ? 1      Do you have any pets in your home ?(please list)0       Current or past profession: Social Worker      Do you exercise?  Yes                            Type & how often:  Water Aerobic, 3-4 hours per week       Do you have a living will? Yes      Do you have a DNR form? Yes                       If not, do you want to discuss one?        Do you have signed POA?HPOA forms?                 If so, please bring to your        appointment         Social Determinants of Health   Financial Resource Strain:   . Difficulty of Paying Living Expenses: Not on file  Food Insecurity:   . Worried About Charity fundraiser in the Last Year: Not on file  . Ran Out of Food in the Last Year: Not on file  Transportation Needs:   . Lack of Transportation (Medical): Not on file  . Lack of Transportation (Non-Medical): Not on file  Physical Activity:   . Days of Exercise per Week: Not on file  . Minutes of Exercise per Session: Not on file  Stress:   . Feeling of Stress : Not on file  Social Connections:   . Frequency of Communication with Friends and Family: Not on file  . Frequency of Social Gatherings with Friends and Family: Not on file  . Attends Religious Services: Not on file  . Active Member of Clubs or Organizations: Not on file  . Attends Archivist Meetings: Not on file  . Marital Status: Not on file    Tobacco Counseling Counseling given: Not Answered   Clinical Intake:  Pre-visit preparation completed: No  Pain : 0-10 Pain Score: 4  Pain  Type: Chronic pain Pain Location: Back (foot) Pain Orientation: Lower Pain Radiating Towards: no Pain Descriptors / Indicators: Sharp Pain Onset:  (november,2020 and Feb,2020) Pain Frequency: Intermittent (whole day) Pain Relieving Factors: Sitting, Aleve Effect of Pain on Daily Activities: slows down  Pain Relieving Factors: Sitting, Aleve  BMI - recorded: 23.7 Nutritional Status: BMI of 19-24  Normal Nutritional Risks: None Diabetes: No  How often do you need to have someone help you when you read instructions, pamphlets, or other written materials from your doctor or pharmacy?: 1 - Never What is the last grade level you completed in school?: Graduate school  Diabetic?No   Interpreter Needed?: No  Information entered by :: Destiny Ellinwood  FNP-C   Activities of Daily Living In your present state of health, do you have any difficulty performing the following activities: 06/30/2020  Hearing? N  Vision? N  Difficulty concentrating or making decisions? N  Walking or climbing stairs? Y  Comment Walks with walker  Dressing or bathing? N  Doing errands, shopping? N  Preparing Food and eating ? N  Using the Toilet? Y  Comment occasional constipation  In the past six months, have you accidently leaked urine? Y  Do you have problems with loss of bowel control? N  Managing your Medications? N  Managing your Finances? N  Housekeeping or managing your Housekeeping? Y  Comment has Chartered certified accountant  Some recent data might be hidden    Patient Care Team: Gayland Curry, DO as PCP - General (Geriatric Medicine) Thompson Grayer, MD as PCP - Electrophysiology (Cardiology) Deland Pretty, MD (Internal Medicine) Ronald Lobo, MD as Consulting Physician (Gastroenterology) Jari Pigg, MD as Consulting Physician (Dermatology) Shon Hough, MD as Consulting Physician (Ophthalmology) Kristeen Miss, MD as Consulting Physician (Neurosurgery) Justice Britain, MD as Consulting Physician (Orthopedic Surgery) Gaynelle Arabian, MD as Consulting Physician (Orthopedic Surgery) Suella Broad, MD as Consulting Physician (Physical Medicine and Rehabilitation)  Indicate any recent Medical Services you may have received from other than [facility] providers in the past year (date may be approximate).     Assessment:   This is a routine wellness examination for Destiny [name].  Hearing/Vision screen  Hearing Screening   _0  _1  _2  _3  _4  _5  _6  _7  _8   Right ear:           Left ear:           Comments: No Hearing Concerns.   Vision Screening Comments: No Vision Concerns. Last Eye Exam was 05/29/2020. Patient wears prescription glasses.  Dietary issues and exercise activities discussed: Current Exercise Habits: Home exercise  routine, Type of exercise: walking;stretching, Time (Minutes): 30, Frequency (Times/Week): 4, Weekly Exercise (Minutes/Week): 120, Intensity: Mild, Exercise limited by: Other - see comments (pain)  Goals    . Increase water intake     Patient will try to increase water intake    . Patient Stated     I would like my health to be better       Depression Screen PHQ 2/9 Scores 06/30/2020 03/18/2020 10/30/2019 06/26/2019 05/22/2019 10/31/2018 09/26/2018  PHQ - 2 Score 0 0 0 0 0 0 0    Fall Risk Fall Risk  06/30/2020 06/02/2020 03/18/2020 10/30/2019 06/26/2019  Falls in the past year? 0 0 0 0 0  Number falls in past yr: 0 0 0 0 -  Injury with Fall? 0 0 0 0 -    Any stairs in or around the home? No  If so, are there any without handrails? No  Home free of loose throw rugs in walkways, pet beds, electrical cords, etc? No  Adequate lighting in your home to reduce risk of falls? Yes   ASSISTIVE DEVICES UTILIZED TO PREVENT FALLS:  Life alert? No  Use of a cane, walker or w/c? Yes  Grab bars in the bathroom? Yes  Shower chair or bench in shower? Yes  Elevated toilet seat or a handicapped toilet? Yes   TIMED UP AND GO:  Was the test performed? No .  Length of time to ambulate 10 feet: N/A  sec.   Gait steady and fast with assistive device  Cognitive Function: MMSE - Mini Mental State Exam 05/08/2018 03/28/2017  Orientation to time 5 5  Orientation to Place 5 5  Registration 3 3  Attention/ Calculation 5 5  Recall 3 3  Language- name 2 objects 2 2  Language- repeat 1 1  Language- follow 3 step command 3 3  Language- read & follow direction 1 1  Write a sentence 1 1  Copy design 1 1  Total score 30 30     6CIT Screen 06/30/2020 06/26/2019  What Year? 0 points 0 points  What month? 0 points 0 points  What time? 0 points 0 points  Count back from 20 0 points 0 points  Months in reverse 0 points 0 points  Repeat phrase 0 points 0 points  Total Score 0 0     Immunizations Immunization History  Administered Date(s) Administered  . DTaP 06/19/2012  . Influenza,inj,Quad PF,6+ Mos 05/12/2016, 06/22/2018, 05/07/2019  . Influenza-Unspecified 08/29/2014, 06/19/2017, 06/26/2020  . Moderna SARS-COVID-2 Vaccination 09/07/2019, 10/10/2019  . Pneumococcal Polysaccharide-23 12/11/1999  . Pneumococcal-Unspecified 08/29/2013  . Zoster 06/24/2006  . Zoster Recombinat (Shingrix) 03/30/2017, 06/16/2017    TDAP status: Up to date Flu Vaccine status: Up to date Pneumococcal vaccine status: Up to date Covid-19 vaccine status: Completed vaccines  Qualifies for Shingles Vaccine? Yes   Zostavax completed Yes   Shingrix Completed?: Yes  Screening Tests Health Maintenance  Topic Date Due  . PNA vac Low Risk Adult (2 of 2 - PCV13) 08/29/2014  . TETANUS/TDAP  06/19/2022  . INFLUENZA VACCINE  Completed  . DEXA SCAN  Completed  . COVID-19 Vaccine  Completed    Health Maintenance  Health Maintenance Due  Topic Date Due  . PNA vac Low Risk Adult (2 of 2 - PCV13) 08/29/2014    Colorectal cancer screening: No longer required.  Mammogram status: No longer required.  Bone Density status: Completed 05/07/2018. Results reflect: Bone density results: OSTEOPENIA. Repeat every 2 years.  Lung Cancer Screening: (Low Dose CT Chest recommended if Age 25-80 years, 30 pack-year currently smoking OR have quit w/in 15years.) does not qualify.   Lung Cancer Screening Referral: No   Additional Screening:  Hepatitis C Screening: does not qualify; Completed does not  Vision Screening: Recommended annual ophthalmology exams for early detection of glaucoma and other disorders of the eye. Is the patient up to date with their annual eye exam?  Yes  Who is the provider or what is the name of the office in which the patient attends annual eye exams? Dr.Stoneburner  If pt is not established with a provider, would they like to be referred to a provider to establish care?  No .   Dental Screening: Recommended annual dental exams for proper oral hygiene  Community Resource Referral / Chronic Care Management: CRR required this visit?  No   CCM required this visit?  No  Plan:   - Bone density   I have personally reviewed and noted the following in the patient's chart:   . Medical and social history . Use of alcohol, tobacco or illicit drugs  . Current medications and supplements . Functional ability and status . Nutritional status . Physical activity . Advanced directives . List of other physicians . Hospitalizations, surgeries, and ER visits in previous 12 months . Vitals . Screenings to include cognitive, depression, and falls . Referrals and appointments  In addition, I have reviewed and discussed with patient certain preventive protocols, quality metrics, and best practice recommendations. A written personalized care plan for preventive services as well as general preventive health recommendations were provided to patient.     Sandrea Hughs, NP   06/30/2020   Nurse Notes:Bone density

## 2020-06-30 NOTE — Telephone Encounter (Signed)
Destiny [name], Destiny [name] are scheduled for a virtual visit with your provider today.    Just as we do with appointments in the office, we must obtain your consent to participate.  Your consent will be active for this visit and any virtual visit you may have with one of our providers in the next 365 days.    If you have a MyChart account, I can also send a copy of this consent to you electronically.  All virtual visits are billed to your insurance company just like a traditional visit in the office.  As this is a virtual visit, video technology does not allow for your provider to perform a traditional examination.  This may limit your provider's ability to fully assess your condition.  If your provider identifies any concerns that need to be evaluated in person or the need to arrange testing such as labs, EKG, etc, we will make arrangements to do so.    Although advances in technology are sophisticated, we cannot ensure that it will always work on either your end or our end.  If the connection with a video visit is poor, we may have to switch to a telephone visit.  With either a video or telephone visit, we are not always able to ensure that we have a secure connection.   I need to obtain your verbal consent now.   Are you willing to proceed with your visit today?   Destiny [name] has provided verbal consent on 06/30/2020 for a virtual visit (video or telephone).   [place], [place] 06/30/2020  2:44 PM

## 2020-07-02 DIAGNOSIS — R3915 Urgency of urination: Secondary | ICD-10-CM | POA: Diagnosis not present

## 2020-07-02 DIAGNOSIS — R32 Unspecified urinary incontinence: Secondary | ICD-10-CM | POA: Diagnosis not present

## 2020-07-08 ENCOUNTER — Ambulatory Visit (INDEPENDENT_AMBULATORY_CARE_PROVIDER_SITE_OTHER): Payer: Medicare Other

## 2020-07-08 DIAGNOSIS — I441 Atrioventricular block, second degree: Secondary | ICD-10-CM

## 2020-07-08 DIAGNOSIS — M76822 Posterior tibial tendinitis, left leg: Secondary | ICD-10-CM | POA: Diagnosis not present

## 2020-07-08 DIAGNOSIS — M79672 Pain in left foot: Secondary | ICD-10-CM | POA: Diagnosis not present

## 2020-07-08 DIAGNOSIS — M76821 Posterior tibial tendinitis, right leg: Secondary | ICD-10-CM | POA: Diagnosis not present

## 2020-07-08 DIAGNOSIS — M4187 Other forms of scoliosis, lumbosacral region: Secondary | ICD-10-CM | POA: Diagnosis not present

## 2020-07-08 DIAGNOSIS — M79671 Pain in right foot: Secondary | ICD-10-CM | POA: Diagnosis not present

## 2020-07-08 DIAGNOSIS — M2141 Flat foot [pes planus] (acquired), right foot: Secondary | ICD-10-CM | POA: Diagnosis not present

## 2020-07-08 DIAGNOSIS — M5459 Other low back pain: Secondary | ICD-10-CM | POA: Diagnosis not present

## 2020-07-08 LAB — CUP PACEART REMOTE DEVICE CHECK
Battery Remaining Longevity: 74 mo
Battery Voltage: 3.01 V
Brady Statistic AP VP Percent: 12.73 %
Brady Statistic AP VS Percent: 0 %
Brady Statistic AS VP Percent: 86.97 %
Brady Statistic AS VS Percent: 0.29 %
Brady Statistic RA Percent Paced: 12.68 %
Brady Statistic RV Percent Paced: 99.46 %
Date Time Interrogation Session: 20211110103032
Implantable Lead Implant Date: 20171219
Implantable Lead Implant Date: 20171219
Implantable Lead Location: 753859
Implantable Lead Location: 753860
Implantable Lead Model: 5076
Implantable Lead Model: 5076
Implantable Pulse Generator Implant Date: 20171219
Lead Channel Impedance Value: 285 Ohm
Lead Channel Impedance Value: 323 Ohm
Lead Channel Impedance Value: 437 Ohm
Lead Channel Impedance Value: 513 Ohm
Lead Channel Pacing Threshold Amplitude: 0.625 V
Lead Channel Pacing Threshold Amplitude: 0.75 V
Lead Channel Pacing Threshold Pulse Width: 0.4 ms
Lead Channel Pacing Threshold Pulse Width: 0.4 ms
Lead Channel Sensing Intrinsic Amplitude: 13.875 mV
Lead Channel Sensing Intrinsic Amplitude: 13.875 mV
Lead Channel Sensing Intrinsic Amplitude: 3 mV
Lead Channel Sensing Intrinsic Amplitude: 3 mV
Lead Channel Setting Pacing Amplitude: 2 V
Lead Channel Setting Pacing Amplitude: 2.5 V
Lead Channel Setting Pacing Pulse Width: 0.4 ms
Lead Channel Setting Sensing Sensitivity: 2.8 mV

## 2020-07-09 NOTE — Progress Notes (Signed)
Remote pacemaker transmission.   

## 2020-07-13 ENCOUNTER — Encounter: Payer: Self-pay | Admitting: Internal Medicine

## 2020-07-13 ENCOUNTER — Other Ambulatory Visit: Payer: Self-pay | Admitting: Internal Medicine

## 2020-07-13 DIAGNOSIS — M76822 Posterior tibial tendinitis, left leg: Secondary | ICD-10-CM | POA: Diagnosis not present

## 2020-07-13 DIAGNOSIS — M2141 Flat foot [pes planus] (acquired), right foot: Secondary | ICD-10-CM | POA: Diagnosis not present

## 2020-07-13 DIAGNOSIS — M76821 Posterior tibial tendinitis, right leg: Secondary | ICD-10-CM | POA: Diagnosis not present

## 2020-07-13 DIAGNOSIS — M5459 Other low back pain: Secondary | ICD-10-CM | POA: Diagnosis not present

## 2020-07-13 DIAGNOSIS — M79672 Pain in left foot: Secondary | ICD-10-CM | POA: Diagnosis not present

## 2020-07-13 DIAGNOSIS — M79671 Pain in right foot: Secondary | ICD-10-CM | POA: Diagnosis not present

## 2020-07-14 DIAGNOSIS — Z23 Encounter for immunization: Secondary | ICD-10-CM | POA: Diagnosis not present

## 2020-07-15 ENCOUNTER — Other Ambulatory Visit: Payer: Self-pay

## 2020-07-15 ENCOUNTER — Non-Acute Institutional Stay: Payer: Medicare Other | Admitting: Internal Medicine

## 2020-07-15 ENCOUNTER — Encounter: Payer: Self-pay | Admitting: Internal Medicine

## 2020-07-15 VITALS — BP 118/72 | HR 82 | Temp 97.5°F | Ht 63.0 in | Wt 132.0 lb

## 2020-07-15 DIAGNOSIS — H9201 Otalgia, right ear: Secondary | ICD-10-CM

## 2020-07-15 DIAGNOSIS — N3941 Urge incontinence: Secondary | ICD-10-CM

## 2020-07-15 DIAGNOSIS — M810 Age-related osteoporosis without current pathological fracture: Secondary | ICD-10-CM

## 2020-07-15 DIAGNOSIS — S32030S Wedge compression fracture of third lumbar vertebra, sequela: Secondary | ICD-10-CM

## 2020-07-15 DIAGNOSIS — I872 Venous insufficiency (chronic) (peripheral): Secondary | ICD-10-CM

## 2020-07-15 DIAGNOSIS — M419 Scoliosis, unspecified: Secondary | ICD-10-CM

## 2020-07-15 DIAGNOSIS — R739 Hyperglycemia, unspecified: Secondary | ICD-10-CM | POA: Diagnosis not present

## 2020-07-15 NOTE — Progress Notes (Signed)
Location:  Occupational psychologist of Service:  Clinic (12)  Provider: Gretta Samons L. Mariea Clonts, D.O., C.M.D.  Code Status: DNR Goals of Care:  Advanced Directives 07/15/2020  Does Patient Have a Medical Advance Directive? Yes  Type of Paramedic of [place];Out of facility DNR (pink MOST or yellow form)  Does patient want to make changes to medical advance directive? No - Patient declined  Copy of [place] in Chart? Yes - validated most recent copy scanned in chart (See row information)  Pre-existing out of facility DNR order (yellow form or pink MOST form) -     Chief Complaint  Patient presents with  . Medical Management of Chronic Issues    4 month follow up   . Acute Visit    foot pain in both feet, knees hurt and ear ache    HPI: Patient is a 86 y.o. female seen today for medical management of chronic diseases.    Says she is wearing out--both feet, knees hurt.  She's seen PA for ortho and he gave her some therapy exercises to do.  She has orthotics.  Pain in bottoms of feet was actually waking her.  Her feet have pronated.  Uses cane and has rollator for longer distance.  Only right ear has been aching.  Saw Dinah 10/5 for it.  Felt to be her sinus--started early in the am, gets better when her nose drains.  She brought OTC ear drops for wax and for pain.  Doesn't use unless hurts.    She has not made up her mind about whether she wants to do evenity.  She says she's going to go ahead after discussion today.    Past Medical History:  Diagnosis Date  . Allergy   . Arthritis   . 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 Cataract   . Chronic anxiety   . Constipation   . Diverticulosis of sigmoid colon   . Emphysema of lung ([place])   . GERD (gastroesophageal reflux disease)   . Glaucoma   . H/O adenomatous polyp of colon    Dr. Cristina Gong  . High cholesterol   . HTN  (hypertension) 07/17/2015   Echo 08/2019: EF 67-89, grade 1 diastolic dysfunction, mild apical HK, normal RV SF, trivial pericardial effusion, moderate MAC, mild MR, mild TR  . Hypothyroidism   . Insomnia   . Lactose intolerance   . Lumbar degenerative disc disease   . Myoview    Myoview 06/2019: EF 48, normal perfusion; low risk (EF normal by echo 10/2018)  . Neuromuscular disorder (HCC)    Pronated ankles /Feet   . Onychomycosis   . Osteoarthritis   . Osteoporosis   . Second degree Mobitz II AV block    s/p PPM  . Spinal stenosis   . Thyroid disease   . Varicose veins     Past Surgical History:  Procedure Laterality Date  . ABDOMINAL HYSTERECTOMY    . BLADDER REPAIR    . blephoplasty bilateral  03/10/2014  . CATARACT EXTRACTION W/ INTRAOCULAR LENS  IMPLANT, BILATERAL  7 & 8 2014  . CHOLECYSTECTOMY    . COLONOSCOPY  05/18/2012  . EP IMPLANTABLE DEVICE N/A 08/16/2016   MDT Advisa MRI conditional PPM implanted by Dr Rayann Heman for mobitz II second degree AV block  . EYE SURGERY Right 05/02/2018   laser eye surgery  . herniated disc repair    .  INCONTINENCE SURGERY    . JOINT REPLACEMENT    . LAMINECTOMY  02/2011   L3-L5  Dr. Ellene Route  . LUMBAR LAMINECTOMY/DECOMPRESSION MICRODISCECTOMY Right 09/21/2015   Procedure: Right Lumbar Three-FourMicrodiskectomy;  Surgeon: Kristeen Miss, MD;  Location: [facility];  Service: Neurosurgery;  Laterality: Right;  Right L3-4 Microdiskectomy  . SHOULDER ARTHROSCOPY WITH ROTATOR CUFF REPAIR Right 08/2003  . TONSILLECTOMY    . TOTAL HIP ARTHROPLASTY     right 1999    Allergies  Allergen Reactions  . Codeine Nausea Only  . Macrobid [Nitrofurantoin] Diarrhea  . Miralax [Polyethylene Glycol] Other (See Comments)    cramping  . Pain Relief [Acetaminophen] Nausea And Vomiting    Patient states she is not allergic   . Paxil [Paroxetine Hcl] Nausea Only  . Percodan [Oxycodone-Aspirin] Itching  . Oxycodone Nausea Only  . Oxycodone-Acetaminophen  Nausea Only  . Penicillins Rash    Has patient had a PCN reaction causing immediate rash, facial/tongue/throat swelling, SOB or lightheadedness with hypotension: YES Has patient had a PCN reaction causing severe rash involving mucus membranes or skin necrosis: YES Has patient had a PCN reaction that required hospitalization NO Has patient had a PCN reaction occurring within the last 10 years: NO If all of the above answers are "NO", then may proceed with Cephalosporin use.    Outpatient Encounter Medications as of 07/15/2020  Medication Sig  . Calcium-Phosphorus-Vitamin D (CITRACAL +D3) 250-107-500 MG-MG-UNIT CHEW Chew 2 tablets by mouth daily.   . Cyanocobalamin (VITAMIN B 12 PO) Take 1 tablet by mouth daily.  Marland Kitchen denosumab (PROLIA) 60 MG/ML SOSY injection Inject 60 mg into the skin every 6 (six) months.  . diltiazem (CARDIZEM CD) 240 MG 24 hr capsule TAKE 1 CAPSULE EVERY DAY.  Marland Kitchen dorzolamide-timolol (COSOPT) 22.3-6.8 MG/ML ophthalmic solution Place 1 drop into both eyes 2 (two) times daily.  . furosemide (LASIX) 20 MG tablet TAKE 1 TABLET IN THE MORNING.  Marland Kitchen ipratropium (ATROVENT) 0.06 % nasal spray USE 2 SPRAYS IN EACH NOSTRIL 3 TIMES A DAY AS NEEDED.  Marland Kitchen irbesartan (AVAPRO) 300 MG tablet TAKE 1 TABLET BY MOUTH DAILY.  Marland Kitchen LACTASE PO Take 1 tablet by mouth as needed (when eating dairy products). Take as directed  . loratadine (CLARITIN) 10 MG tablet Take 1 tablet (10 mg total) by mouth daily.  Marland Kitchen LUMIGAN 0.01 % SOLN Place 1 drop into both eyes 2 (two) times daily.   . Multiple Vitamins-Minerals (MULTIVITAMIN WITH MINERALS) tablet Take 1 tablet by mouth daily.  Mckinley Jewel Dimesylate (RHOPRESSA) 0.02 % SOLN Apply 1 drop to eye at bedtime. Right eye  . Probiotic Product (ALIGN) 4 MG CAPS Take by mouth daily.  . rosuvastatin (CRESTOR) 5 MG tablet TAKE 1 TABLET ONCE DAILY FOR CHOLESTEROL.  . SYNTHROID 125 MCG tablet TAKE 1 TABLET ONCE DAILY.  Marland Kitchen UNABLE TO FIND Take by mouth as needed. Med Name: OTC  purified peppermint oil for IBS - 3 caps before lunch and dinner   . UNABLE TO FIND Med Name:Hemeopathic ear drops  2 drops twice daily   No facility-administered encounter medications on file as of 07/15/2020.    Review of Systems:  Review of Systems  Constitutional: Positive for malaise/fatigue. Negative for chills and fever.  HENT: Positive for ear pain and hearing loss. Negative for tinnitus.   Eyes: Negative for blurred vision.       Glaucoma  Respiratory: Negative for cough and shortness of breath.   Cardiovascular: Negative for chest pain, palpitations and  leg swelling.       Compression socks are working well   Gastrointestinal: Negative for abdominal pain, blood in stool, constipation, diarrhea and melena.       Constipation controlled  Genitourinary: Positive for frequency and urgency. Negative for dysuria.       Improved after UTIs treated but has some leakage if waits more than 2 hrs b/w urinating  Musculoskeletal: Positive for back pain and joint pain. Negative for falls.       Knees, feet, back, scoliosis  Neurological: Negative for dizziness and loss of consciousness.       Pronation of feet  Endo/Heme/Allergies: Bruises/bleeds easily.  Psychiatric/Behavioral: Negative for depression and memory loss. The patient is not nervous/anxious and does not have insomnia.     Health Maintenance  Topic Date Due  . PNA vac Low Risk Adult (2 of 2 - PCV13) 08/29/2014  . TETANUS/TDAP  06/19/2022  . INFLUENZA VACCINE  Completed  . DEXA SCAN  Completed  . COVID-19 Vaccine  Completed    Physical Exam: Vitals:   07/15/20 1350  BP: 118/72  Pulse: 82  Temp: (!) 97.5 F (36.4 C)  TempSrc: Temporal  SpO2: 95%  Weight: 132 lb (59.9 kg)  Height: _0  (1.6 m)   Body mass index is 23.38 kg/m. Physical Exam Vitals reviewed.  Constitutional:      General: She is not in acute distress.    Appearance: Normal appearance. She is not toxic-appearing.  HENT:     Head:  Normocephalic and atraumatic.     Right Ear: Tympanic membrane, ear canal and external ear normal.     Left Ear: Tympanic membrane, ear canal and external ear normal.     Ears:     Comments: No erythema, excoriation of canal, great light reflex bilaterally, some hearing loss Eyes:     Extraocular Movements: Extraocular movements intact.     Pupils: Pupils are equal, round, and reactive to light.  Neck:     Vascular: No carotid bruit.  Cardiovascular:     Rate and Rhythm: Normal rate and regular rhythm.     Pulses: Normal pulses.     Heart sounds: Normal heart sounds.  Pulmonary:     Effort: Pulmonary effort is normal.     Breath sounds: Normal breath sounds. No wheezing, rhonchi or rales.  Abdominal:     General: Bowel sounds are normal.     Tenderness: There is no abdominal tenderness. There is no guarding or rebound.  Musculoskeletal:        General: Normal range of motion.     Right lower leg: No edema.     Left lower leg: No edema.  Skin:    General: Skin is warm and dry.  Neurological:     General: No focal deficit present.     Mental Status: She is alert and oriented to person, place, and time.     Gait: Gait abnormal.     Comments: Using quad cane today, wearing brace for her kyphoscoliosis  Psychiatric:        Mood and Affect: Mood normal.        Behavior: Behavior normal.     Labs reviewed: Basic Metabolic Panel: Recent Labs    06/25/20 0000  NA 141  K 4.2  CL 106  CO2 25*  BUN 15  CREATININE 0.5  CALCIUM 9.7  TSH 0.93   Liver Function Tests: Recent Labs    06/25/20 0000  AST 21  ALT  19  ALBUMIN 4.3   No results for input(s): LIPASE, AMYLASE in the last 8760 hours. No results for input(s): AMMONIA in the last 8760 hours. CBC: Recent Labs    06/25/20 0000  WBC 6.1  HGB 13.6  HCT 41  PLT 214   Lipid Panel: Recent Labs    06/25/20 0000  CHOL 163  HDL 76*  TRIG 108   Lab Results  Component Value Date   HGBA1C 5.6 06/25/2020     Procedures since last visit: [place]  Result Date: 07/08/2020 Scheduled remote reviewed. Normal device function.  Next remote 91 days. Kathy Breach, RN, CCDS, [employer]   Assessment/Plan 1. Right ear pain -not at present, just during ams until congestion in sinuses ceases, cannot use steroid nasal sprays with her glaucoma  2. Senile osteoporosis -she has decided she will start on evenity in Jan when next due for treatment -notified Lattie Haw at our office via staff message  3. Closed compression fracture of L3 lumbar vertebra, sequela -continues cane and rollator for longer distances, pain controlled at this time  4. Kyphoscoliosis -cont brace and rollator  5.  Venous insufficiency -cont compression socks  6.  Hyperglycemia -hba1c normal this last check  7.  Urge incontinence -had prior bladder lift procedure but had severe constipation after back surgery and incontinence returned -doing ok though at present after infections treated -plans to hold off on urogyn referral and does not intend on surgery regardless at her age and with her other medical concerns (as she puts it)  Labs/tests ordered:  No new Next appt:  09/21/2020  Eleuterio Dollar L. Jamion Carter, D.O. [facility] [address], [place] [address] Cell Phone (Mon-Fri 8am-5pm):  [phone] On Call:  [phone] & follow prompts after 5pm & weekends Office Phone:  [phone] Office Fax:  [phone]

## 2020-07-16 DIAGNOSIS — M79671 Pain in right foot: Secondary | ICD-10-CM | POA: Diagnosis not present

## 2020-07-16 DIAGNOSIS — M76821 Posterior tibial tendinitis, right leg: Secondary | ICD-10-CM | POA: Diagnosis not present

## 2020-07-16 DIAGNOSIS — M76822 Posterior tibial tendinitis, left leg: Secondary | ICD-10-CM | POA: Diagnosis not present

## 2020-07-16 DIAGNOSIS — M2141 Flat foot [pes planus] (acquired), right foot: Secondary | ICD-10-CM | POA: Diagnosis not present

## 2020-07-16 DIAGNOSIS — M5459 Other low back pain: Secondary | ICD-10-CM | POA: Diagnosis not present

## 2020-07-16 DIAGNOSIS — M79672 Pain in left foot: Secondary | ICD-10-CM | POA: Diagnosis not present

## 2020-07-20 DIAGNOSIS — M79671 Pain in right foot: Secondary | ICD-10-CM | POA: Diagnosis not present

## 2020-07-20 DIAGNOSIS — M2141 Flat foot [pes planus] (acquired), right foot: Secondary | ICD-10-CM | POA: Diagnosis not present

## 2020-07-20 DIAGNOSIS — M5459 Other low back pain: Secondary | ICD-10-CM | POA: Diagnosis not present

## 2020-07-20 DIAGNOSIS — M76821 Posterior tibial tendinitis, right leg: Secondary | ICD-10-CM | POA: Diagnosis not present

## 2020-07-20 DIAGNOSIS — M79672 Pain in left foot: Secondary | ICD-10-CM | POA: Diagnosis not present

## 2020-07-20 DIAGNOSIS — M76822 Posterior tibial tendinitis, left leg: Secondary | ICD-10-CM | POA: Diagnosis not present

## 2020-07-27 DIAGNOSIS — M76822 Posterior tibial tendinitis, left leg: Secondary | ICD-10-CM | POA: Diagnosis not present

## 2020-07-27 DIAGNOSIS — M79671 Pain in right foot: Secondary | ICD-10-CM | POA: Diagnosis not present

## 2020-07-27 DIAGNOSIS — M2141 Flat foot [pes planus] (acquired), right foot: Secondary | ICD-10-CM | POA: Diagnosis not present

## 2020-07-27 DIAGNOSIS — M5459 Other low back pain: Secondary | ICD-10-CM | POA: Diagnosis not present

## 2020-07-27 DIAGNOSIS — M79672 Pain in left foot: Secondary | ICD-10-CM | POA: Diagnosis not present

## 2020-07-27 DIAGNOSIS — M76821 Posterior tibial tendinitis, right leg: Secondary | ICD-10-CM | POA: Diagnosis not present

## 2020-07-30 DIAGNOSIS — M76821 Posterior tibial tendinitis, right leg: Secondary | ICD-10-CM | POA: Diagnosis not present

## 2020-07-30 DIAGNOSIS — M76822 Posterior tibial tendinitis, left leg: Secondary | ICD-10-CM | POA: Diagnosis not present

## 2020-07-30 DIAGNOSIS — M79672 Pain in left foot: Secondary | ICD-10-CM | POA: Diagnosis not present

## 2020-07-30 DIAGNOSIS — M79671 Pain in right foot: Secondary | ICD-10-CM | POA: Diagnosis not present

## 2020-07-30 DIAGNOSIS — M5459 Other low back pain: Secondary | ICD-10-CM | POA: Diagnosis not present

## 2020-07-30 DIAGNOSIS — M4187 Other forms of scoliosis, lumbosacral region: Secondary | ICD-10-CM | POA: Diagnosis not present

## 2020-07-30 DIAGNOSIS — M2141 Flat foot [pes planus] (acquired), right foot: Secondary | ICD-10-CM | POA: Diagnosis not present

## 2020-09-21 ENCOUNTER — Other Ambulatory Visit: Payer: Self-pay

## 2020-09-21 ENCOUNTER — Ambulatory Visit (INDEPENDENT_AMBULATORY_CARE_PROVIDER_SITE_OTHER): Payer: Medicare Other

## 2020-09-21 DIAGNOSIS — M810 Age-related osteoporosis without current pathological fracture: Secondary | ICD-10-CM

## 2020-09-21 MED ORDER — ROMOSOZUMAB-AQQG 105 MG/1.17ML ~~LOC~~ SOSY
210.0000 mg | PREFILLED_SYRINGE | Freq: Once | SUBCUTANEOUS | Status: AC
  Administered 2020-09-21: 210 mg via SUBCUTANEOUS

## 2020-10-07 ENCOUNTER — Ambulatory Visit (INDEPENDENT_AMBULATORY_CARE_PROVIDER_SITE_OTHER): Payer: Medicare Other

## 2020-10-07 DIAGNOSIS — I441 Atrioventricular block, second degree: Secondary | ICD-10-CM | POA: Diagnosis not present

## 2020-10-08 LAB — CUP PACEART REMOTE DEVICE CHECK
Battery Remaining Longevity: 73 mo
Battery Voltage: 3.01 V
Brady Statistic AP VP Percent: 10.6 %
Brady Statistic AP VS Percent: 0 %
Brady Statistic AS VP Percent: 89.23 %
Brady Statistic AS VS Percent: 0.17 %
Brady Statistic RA Percent Paced: 10.59 %
Brady Statistic RV Percent Paced: 99.76 %
Date Time Interrogation Session: 20220209121946
Implantable Lead Implant Date: 20171219
Implantable Lead Implant Date: 20171219
Implantable Lead Location: 753859
Implantable Lead Location: 753860
Implantable Lead Model: 5076
Implantable Lead Model: 5076
Implantable Pulse Generator Implant Date: 20171219
Lead Channel Impedance Value: 304 Ohm
Lead Channel Impedance Value: 342 Ohm
Lead Channel Impedance Value: 665 Ohm
Lead Channel Impedance Value: 741 Ohm
Lead Channel Pacing Threshold Amplitude: 0.625 V
Lead Channel Pacing Threshold Amplitude: 1.375 V
Lead Channel Pacing Threshold Pulse Width: 0.4 ms
Lead Channel Pacing Threshold Pulse Width: 0.4 ms
Lead Channel Sensing Intrinsic Amplitude: 3.125 mV
Lead Channel Sensing Intrinsic Amplitude: 3.125 mV
Lead Channel Sensing Intrinsic Amplitude: 9.625 mV
Lead Channel Sensing Intrinsic Amplitude: 9.625 mV
Lead Channel Setting Pacing Amplitude: 2 V
Lead Channel Setting Pacing Amplitude: 2.75 V
Lead Channel Setting Pacing Pulse Width: 0.4 ms
Lead Channel Setting Sensing Sensitivity: 2.8 mV

## 2020-10-12 NOTE — Progress Notes (Signed)
Remote pacemaker transmission.   

## 2020-10-13 ENCOUNTER — Other Ambulatory Visit: Payer: Self-pay | Admitting: Internal Medicine

## 2020-10-19 ENCOUNTER — Encounter: Payer: Self-pay | Admitting: Internal Medicine

## 2020-10-21 DIAGNOSIS — H401113 Primary open-angle glaucoma, right eye, severe stage: Secondary | ICD-10-CM | POA: Diagnosis not present

## 2020-10-22 ENCOUNTER — Other Ambulatory Visit: Payer: Self-pay | Admitting: Internal Medicine

## 2020-10-22 ENCOUNTER — Ambulatory Visit: Payer: Medicare Other

## 2020-10-23 DIAGNOSIS — H43813 Vitreous degeneration, bilateral: Secondary | ICD-10-CM | POA: Diagnosis not present

## 2020-10-23 DIAGNOSIS — H52203 Unspecified astigmatism, bilateral: Secondary | ICD-10-CM | POA: Diagnosis not present

## 2020-10-23 DIAGNOSIS — Z961 Presence of intraocular lens: Secondary | ICD-10-CM | POA: Diagnosis not present

## 2020-10-23 DIAGNOSIS — H401132 Primary open-angle glaucoma, bilateral, moderate stage: Secondary | ICD-10-CM | POA: Diagnosis not present

## 2020-10-26 ENCOUNTER — Ambulatory Visit (INDEPENDENT_AMBULATORY_CARE_PROVIDER_SITE_OTHER): Payer: Medicare Other | Admitting: *Deleted

## 2020-10-26 ENCOUNTER — Other Ambulatory Visit: Payer: Self-pay

## 2020-10-26 ENCOUNTER — Telehealth: Payer: Self-pay | Admitting: *Deleted

## 2020-10-26 DIAGNOSIS — M810 Age-related osteoporosis without current pathological fracture: Secondary | ICD-10-CM | POA: Diagnosis not present

## 2020-10-26 MED ORDER — ROMOSOZUMAB-AQQG 105 MG/1.17ML ~~LOC~~ SOSY
210.0000 mg | PREFILLED_SYRINGE | Freq: Once | SUBCUTANEOUS | Status: AC
  Administered 2020-10-26: 210 mg via SUBCUTANEOUS

## 2020-10-26 NOTE — Telephone Encounter (Signed)
Patient called and stated that she received the Evenity injection this morning. Stated that she felt fine and ate lunch and Around 2pm she became extremely tired so she laid down and took a nap. Now she is  Feeling alittle bit wobbly but not as tired after taking a nap.  Patient has called the [facility] nurse and she is going to come by and evaluate.   FYI

## 2020-11-03 ENCOUNTER — Other Ambulatory Visit: Payer: Self-pay | Admitting: Internal Medicine

## 2020-11-11 ENCOUNTER — Non-Acute Institutional Stay: Payer: Medicare Other | Admitting: Internal Medicine

## 2020-11-11 ENCOUNTER — Encounter: Payer: Self-pay | Admitting: Internal Medicine

## 2020-11-11 ENCOUNTER — Other Ambulatory Visit: Payer: Self-pay

## 2020-11-11 VITALS — BP 148/78 | HR 85 | Temp 98.6°F | Ht 63.0 in | Wt 143.0 lb

## 2020-11-11 DIAGNOSIS — S32030S Wedge compression fracture of third lumbar vertebra, sequela: Secondary | ICD-10-CM | POA: Diagnosis not present

## 2020-11-11 DIAGNOSIS — M419 Scoliosis, unspecified: Secondary | ICD-10-CM | POA: Diagnosis not present

## 2020-11-11 DIAGNOSIS — I872 Venous insufficiency (chronic) (peripheral): Secondary | ICD-10-CM

## 2020-11-11 DIAGNOSIS — N3941 Urge incontinence: Secondary | ICD-10-CM

## 2020-11-11 DIAGNOSIS — M159 Polyosteoarthritis, unspecified: Secondary | ICD-10-CM

## 2020-11-11 DIAGNOSIS — M810 Age-related osteoporosis without current pathological fracture: Secondary | ICD-10-CM

## 2020-11-11 DIAGNOSIS — M8949 Other hypertrophic osteoarthropathy, multiple sites: Secondary | ICD-10-CM

## 2020-11-11 NOTE — Progress Notes (Signed)
Location:  Occupational psychologist of Service:  Clinic (12)  Provider: Devion Chriscoe L. Mariea Clonts, D.O., C.M.D.  Code Status: DNR Goals of Care:  Advanced Directives 07/15/2020  Does Patient Have a Medical Advance Directive? Yes  Type of Paramedic of [place];Out of facility DNR (pink MOST or yellow form)  Does patient want to make changes to medical advance directive? No - Patient declined  Copy of [place] in Chart? Yes - validated most recent copy scanned in chart (See row information)  Pre-existing out of facility DNR order (yellow form or pink MOST form) -     Chief Complaint  Patient presents with  . Medical Management of Chronic Issues    Medical Management of Chronic Issues. 4 Month Follow up    HPI: Patient is a 87 y.o. female seen today for medical management of chronic diseases.    Has same concerns.    Says she has gained weight.  Up 11 lbs from Nov.  Has been getting muffins in the dining room.  She's been very sedentary.  She wants to try to go back to the pool.  She has a lot of eczema on her back and fearful that will get worse in the pool.    Back still hurts.    She's had elbow, joint, hips and feet pain at night.  She also has had pain in her butt on one side.  She went back to ortho before Christmas.  Went to Dr. Nona Dell PA re: her foot pain.  Her arches have fallen as we discussed at a prior visit.  Posterior tibial tendon left leg dysfunction.  Was referred for PT and had a couple sessions but then she relocated.  Has orthotics in her shoes.  Right foot goes over to the floor w/o orthotics.    The day of her last evenity, she was eating lunch and felt like she was getting the flu.  Her thermometer did not work.  Temp 98, then 99 when nurse took. The next day she was ok.  That was only her second one.  She was not having problems taking prolia.  She's had reactions to prevnar 13 and penicillin with rash and  swelling.  Last prolia was in July.  Still having bladder control problems, too.  Requests urogynecology referral.  Does not want surgery at her age and with her comorbidities.    Past Medical History:  Diagnosis Date  . Allergy   . Arthritis   . 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 Cataract   . Chronic anxiety   . Constipation   . Diverticulosis of sigmoid colon   . Emphysema of lung ([place])   . GERD (gastroesophageal reflux disease)   . Glaucoma   . H/O adenomatous polyp of colon    Dr. Cristina Gong  . High cholesterol   . HTN (hypertension) 07/17/2015   Echo 08/2019: EF 63-14, grade 1 diastolic dysfunction, mild apical HK, normal RV SF, trivial pericardial effusion, moderate MAC, mild MR, mild TR  . Hypothyroidism   . Insomnia   . Lactose intolerance   . Lumbar degenerative disc disease   . Myoview    Myoview 06/2019: EF 48, normal perfusion; low risk (EF normal by echo 10/2018)  . Neuromuscular disorder (HCC)    Pronated ankles /Feet   . Onychomycosis   . Osteoarthritis   . Osteoporosis   .  Second degree Mobitz II AV block    s/p PPM  . Spinal stenosis   . Thyroid disease   . Varicose veins    Past Surgical History:  Procedure Laterality Date  . ABDOMINAL HYSTERECTOMY    . BLADDER REPAIR    . blephoplasty bilateral  03/10/2014  . CATARACT EXTRACTION W/ INTRAOCULAR LENS  IMPLANT, BILATERAL  7 & 8 2014  . CHOLECYSTECTOMY    . COLONOSCOPY  05/18/2012  . EP IMPLANTABLE DEVICE N/A 08/16/2016   MDT Advisa MRI conditional PPM implanted by Dr Rayann Heman for mobitz II second degree AV block  . EYE SURGERY Right 05/02/2018   laser eye surgery  . herniated disc repair    . INCONTINENCE SURGERY    . JOINT REPLACEMENT    . LAMINECTOMY  02/2011   L3-L5  Dr. Ellene Route  . LUMBAR LAMINECTOMY/DECOMPRESSION MICRODISCECTOMY Right 09/21/2015   Procedure: Right Lumbar Three-FourMicrodiskectomy;  Surgeon: Kristeen Miss, MD;  Location: [facility];  Service: Neurosurgery;  Laterality: Right;  Right L3-4 Microdiskectomy  . SHOULDER ARTHROSCOPY WITH ROTATOR CUFF REPAIR Right 08/2003  . TONSILLECTOMY    . TOTAL HIP ARTHROPLASTY     right 1999    Allergies  Allergen Reactions  . Codeine Nausea Only  . Macrobid [Nitrofurantoin] Diarrhea  . Miralax [Polyethylene Glycol] Other (See Comments)    cramping  . Pain Relief [Acetaminophen] Nausea And Vomiting    Patient states she is not allergic   . Paxil [Paroxetine Hcl] Nausea Only  . Percodan [Oxycodone-Aspirin] Itching  . Oxycodone Nausea Only  . Oxycodone-Acetaminophen Nausea Only  . Penicillins Rash    Has patient had a PCN reaction causing immediate rash, facial/tongue/throat swelling, SOB or lightheadedness with hypotension: YES Has patient had a PCN reaction causing severe rash involving mucus membranes or skin necrosis: YES Has patient had a PCN reaction that required hospitalization NO Has patient had a PCN reaction occurring within the last 10 years: NO If all of the above answers are "NO", then may proceed with Cephalosporin use.    Outpatient Encounter Medications as of 11/11/2020  Medication Sig  . Calcium-Phosphorus-Vitamin D (CITRACAL +D3) 250-107-500 MG-MG-UNIT CHEW Chew 2 tablets by mouth daily.   . Cyanocobalamin (VITAMIN B 12 PO) Take 1 tablet by mouth daily.  Marland Kitchen diltiazem (CARDIZEM CD) 240 MG 24 hr capsule TAKE 1 CAPSULE EVERY DAY.  Marland Kitchen dorzolamide-timolol (COSOPT) 22.3-6.8 MG/ML ophthalmic solution Place 1 drop into both eyes 2 (two) times daily.  . furosemide (LASIX) 20 MG tablet TAKE 1 TABLET IN THE MORNING.  Marland Kitchen ipratropium (ATROVENT) 0.06 % nasal spray USE 2 SPRAYS IN EACH NOSTRIL 3 TIMES A DAY AS NEEDED.  Marland Kitchen irbesartan (AVAPRO) 300 MG tablet TAKE 1 TABLET BY MOUTH DAILY.  Marland Kitchen LACTASE PO Take 1 tablet by mouth as needed (when eating dairy products). Take as directed  . loratadine (CLARITIN) 10 MG tablet Take 1 tablet (10 mg total) by mouth daily.  Marland Kitchen LUMIGAN 0.01  % SOLN Place 1 drop into both eyes 2 (two) times daily.   . Multiple Vitamins-Minerals (MULTIVITAMIN WITH MINERALS) tablet Take 1 tablet by mouth daily.  Mckinley Jewel Dimesylate (RHOPRESSA) 0.02 % SOLN Apply 1 drop to eye at bedtime. Right eye  . Probiotic Product (ALIGN) 4 MG CAPS Take by mouth daily.  . Romosozumab-aqqg (EVENITY [place]) Inject 210 mg once monthly  . rosuvastatin (CRESTOR) 5 MG tablet TAKE 1 TABLET ONCE DAILY FOR CHOLESTEROL.  . SYNTHROID 125 MCG tablet TAKE 1 TABLET ONCE DAILY.  Marland Kitchen  UNABLE TO FIND Take by mouth as needed. Med Name: OTC purified peppermint oil for IBS - 3 caps before lunch and dinner   . UNABLE TO FIND Med Name:Hemeopathic ear drops  2 drops twice daily   No facility-administered encounter medications on file as of 11/11/2020.    Review of Systems:  Review of Systems  Constitutional: Negative for chills, fever and malaise/fatigue.  HENT: Negative for congestion and sore throat.   Eyes: Negative for blurred vision.  Respiratory: Negative for cough and shortness of breath.   Cardiovascular: Positive for leg swelling. Negative for chest pain and palpitations.  Gastrointestinal: Negative for abdominal pain.  Genitourinary: Positive for frequency and urgency. Negative for dysuria.       Leakage  Musculoskeletal: Positive for back pain and joint pain. Negative for falls.  Skin: Negative for itching and rash.       Eczema of back  Neurological: Negative for dizziness and loss of consciousness.  Endo/Heme/Allergies: Bruises/bleeds easily.  Psychiatric/Behavioral: Negative for depression and memory loss. The patient is not nervous/anxious and does not have insomnia.     Health Maintenance  Topic Date Due  . PNA vac Low Risk Adult (2 of 2 - PCV13) 08/29/2014  . COVID-19 Vaccine (4 - Booster for Moderna series) 01/08/2021  . TETANUS/TDAP  06/19/2022  . INFLUENZA VACCINE  Completed  . DEXA SCAN  Completed  . HPV VACCINES  Aged Out    Physical Exam: Vitals:    11/11/20 1114  BP: (!) 148/78  Pulse: 85  Temp: 98.6 F (37 C)  TempSrc: Skin  SpO2: 96%  Weight: 143 lb (64.9 kg)  Height: _0  (1.6 m)   Body mass index is 25.33 kg/m. Physical Exam Vitals reviewed.  Constitutional:      Appearance: Normal appearance.  HENT:     Head: Normocephalic and atraumatic.  Cardiovascular:     Rate and Rhythm: Normal rate and regular rhythm.     Pulses: Normal pulses.     Heart sounds: Normal heart sounds.  Pulmonary:     Effort: Pulmonary effort is normal.     Breath sounds: Normal breath sounds. No wheezing, rhonchi or rales.  Abdominal:     General: Bowel sounds are normal.     Palpations: Abdomen is soft.  Musculoskeletal:        General: Normal range of motion.     Right lower leg: Edema present.     Left lower leg: Edema present.  Neurological:     General: No focal deficit present.     Mental Status: She is alert and oriented to person, place, and time.     Gait: Gait abnormal.     Comments: Walks with rolling walker with skis; kyphoscoliosis, wears back brace  Psychiatric:        Mood and Affect: Mood normal.     Labs reviewed: Basic Metabolic Panel: Recent Labs    06/25/20 0000  NA 141  K 4.2  CL 106  CO2 25*  BUN 15  CREATININE 0.5  CALCIUM 9.7  TSH 0.93   Liver Function Tests: Recent Labs    06/25/20 0000  AST 21  ALT 19  ALBUMIN 4.3   No results for input(s): LIPASE, AMYLASE in the last 8760 hours. No results for input(s): AMMONIA in the last 8760 hours. CBC: Recent Labs    06/25/20 0000  WBC 6.1  HGB 13.6  HCT 41  PLT 214   Lipid Panel: Recent Labs  06/25/20 0000  CHOL 163  HDL 76*  TRIG 108   Lab Results  Component Value Date   HGBA1C 5.6 06/25/2020    Procedures since last visit: No results found.  Assessment/Plan 1. Urge incontinence - more bothersome as of late and requests urogyn referral--not just urology, not just gyn, but urogyn - Ambulatory referral to Urogynecology  2.  Senile osteoporosis -d/c evenity due to reaction last time -restart prolia to try to maintain bone density at least -next appt will be for prolia instead of evenity end of this month  3. Closed compression fracture of L3 lumbar vertebra, sequela -continues to have chronic back pain, cont brace, tylenol, conservative measures  4. Kyphoscoliosis -ongoing, encouraged more exercise as her pain allows b/c that does help her other joints to feel better  5. Venous insufficiency of both lower extremities -elevate feet at rest, continue compression stockings  6. Primary osteoarthritis involving multiple joints -cont tylenol, may use topicals, heat, ice, and exercise  Labs/tests ordered:  Cbc with diff, hba1c, bmp before next visit Next appt:  11/26/2020 for prolia instead of evenity; 4 mos in [place]. Kaeli Nichelson, D.O. [facility] [address], [place] [address] Cell Phone (Mon-Fri 8am-5pm):  [phone] On Call:  [phone] & follow prompts after 5pm & weekends Office Phone:  [phone] Office Fax:  [phone]

## 2020-11-23 ENCOUNTER — Other Ambulatory Visit: Payer: Self-pay

## 2020-11-23 ENCOUNTER — Non-Acute Institutional Stay: Payer: Medicare Other | Admitting: Adult Health

## 2020-11-23 ENCOUNTER — Encounter: Payer: Self-pay | Admitting: Adult Health

## 2020-11-23 VITALS — BP 156/78 | HR 70 | Temp 97.8°F | Ht 63.0 in | Wt 141.8 lb

## 2020-11-23 DIAGNOSIS — I5032 Chronic diastolic (congestive) heart failure: Secondary | ICD-10-CM | POA: Diagnosis not present

## 2020-11-23 DIAGNOSIS — R42 Dizziness and giddiness: Secondary | ICD-10-CM | POA: Diagnosis not present

## 2020-11-23 DIAGNOSIS — I10 Essential (primary) hypertension: Secondary | ICD-10-CM | POA: Diagnosis not present

## 2020-11-23 MED ORDER — FUROSEMIDE 20 MG PO TABS: 20.0000 mg | ORAL_TABLET | ORAL | 1 refills | Status: DC

## 2020-11-23 NOTE — Progress Notes (Addendum)
Location: [facility] [phone])  Provider:  Cindi Carbon, [place] [phone]  Code Status: DNR Goals of Care:  Advanced Directives 11/11/2020  Does Patient Have a Medical Advance Directive? Yes  Type of Paramedic of [place];Out of facility DNR (pink MOST or yellow form)  Does patient want to make changes to medical advance directive? No - Patient declined  Copy of [place] in Chart? -  Pre-existing out of facility DNR order (yellow form or pink MOST form) -     Chief Complaint  Patient presents with  . Acute Visit    Complains of Increase blood pressure and dizziness since last night    HPI: Patient is a 87 y.o. female seen today for an acute visit for feeling dizzy and high hp. She reports her bp was in the 150's last evening and she felt dizziness. This was not associated with standing. No other symptoms such as ringing in the ears, chest pain, sob, palpitations. She felt better with rest after a couple of hrs.   Felt like she was going to pass out a couple weeks ago and her bp was in the 150's then. She wandered if it was due to taking evenity, which has been discontinued.   She has not been taking lasix and is gaining weight with more fluid in her legs. She was avoiding it due to urinary frequency. She has an apt with urology about this issue next month.   Wt Readings from Last 3 Encounters:  11/23/20 141 lb 12.8 oz (64.3 kg)  11/11/20 143 lb (64.9 kg)  07/15/20 132 lb (59.9 kg)   She reports she is more sedentary due to back pain associated with her compression fx.   Past Medical History:  Diagnosis Date  . Allergy   . Arthritis   . 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 Cataract   . Chronic anxiety   . Constipation   . Diverticulosis of sigmoid colon   . Emphysema of lung ([place])   . GERD (gastroesophageal reflux  disease)   . Glaucoma   . H/O adenomatous polyp of colon    Dr. Cristina Gong  . High cholesterol   . HTN (hypertension) 07/17/2015   Echo 08/2019: EF 09-98, grade 1 diastolic dysfunction, mild apical HK, normal RV SF, trivial pericardial effusion, moderate MAC, mild MR, mild TR  . Hypothyroidism   . Insomnia   . Lactose intolerance   . Lumbar degenerative disc disease   . Myoview    Myoview 06/2019: EF 48, normal perfusion; low risk (EF normal by echo 10/2018)  . Neuromuscular disorder (HCC)    Pronated ankles /Feet   . Onychomycosis   . Osteoarthritis   . Osteoporosis   . Second degree Mobitz II AV block    s/p PPM  . Spinal stenosis   . Thyroid disease   . Varicose veins     Past Surgical History:  Procedure Laterality Date  . ABDOMINAL HYSTERECTOMY    . BLADDER REPAIR    . blephoplasty bilateral  03/10/2014  . CATARACT EXTRACTION W/ INTRAOCULAR LENS  IMPLANT, BILATERAL  7 & 8 2014  . CHOLECYSTECTOMY    . COLONOSCOPY  05/18/2012  . EP IMPLANTABLE DEVICE N/A 08/16/2016   MDT Advisa MRI conditional PPM implanted by Dr Rayann Heman for mobitz II second degree AV block  . EYE SURGERY Right 05/02/2018  laser eye surgery  . herniated disc repair    . INCONTINENCE SURGERY    . JOINT REPLACEMENT    . LAMINECTOMY  02/2011   L3-L5  Dr. Ellene Route  . LUMBAR LAMINECTOMY/DECOMPRESSION MICRODISCECTOMY Right 09/21/2015   Procedure: Right Lumbar Three-FourMicrodiskectomy;  Surgeon: Kristeen Miss, MD;  Location: [facility];  Service: Neurosurgery;  Laterality: Right;  Right L3-4 Microdiskectomy  . SHOULDER ARTHROSCOPY WITH ROTATOR CUFF REPAIR Right 08/2003  . TONSILLECTOMY    . TOTAL HIP ARTHROPLASTY     right 1999    Allergies  Allergen Reactions  . Codeine Nausea Only  . Macrobid [Nitrofurantoin] Diarrhea  . Miralax [Polyethylene Glycol] Other (See Comments)    cramping  . Other Nausea And Vomiting and Other (See Comments)  . Pain Relief [Acetaminophen] Nausea And Vomiting    Patient states  she is not allergic   . Paxil [Paroxetine Hcl] Nausea Only  . Percodan [Oxycodone-Aspirin] Itching  . Oxycodone Nausea Only  . Oxycodone-Acetaminophen Nausea Only  . Penicillins Rash    Has patient had a PCN reaction causing immediate rash, facial/tongue/throat swelling, SOB or lightheadedness with hypotension: YES Has patient had a PCN reaction causing severe rash involving mucus membranes or skin necrosis: YES Has patient had a PCN reaction that required hospitalization NO Has patient had a PCN reaction occurring within the last 10 years: NO If all of the above answers are "NO", then may proceed with Cephalosporin use.    Outpatient Encounter Medications as of 11/23/2020  Medication Sig  . Calcium-Phosphorus-Vitamin D [phone] MG-MG-UNIT CHEW Chew 2 tablets by mouth daily.   . Cyanocobalamin (VITAMIN B 12 PO) Take 1 tablet by mouth daily.  Marland Kitchen diltiazem (CARDIZEM CD) 240 MG 24 hr capsule TAKE 1 CAPSULE EVERY DAY.  Marland Kitchen dorzolamide-timolol (COSOPT) 22.3-6.8 MG/ML ophthalmic solution Place 1 drop into both eyes 2 (two) times daily.  . furosemide (LASIX) 20 MG tablet TAKE 1 TABLET IN THE MORNING.  Marland Kitchen ipratropium (ATROVENT) 0.06 % nasal spray USE 2 SPRAYS IN EACH NOSTRIL 3 TIMES A DAY AS NEEDED.  Marland Kitchen irbesartan (AVAPRO) 300 MG tablet TAKE 1 TABLET BY MOUTH DAILY.  Marland Kitchen LACTASE PO Take 1 tablet by mouth as needed (when eating dairy products). Take as directed  . loratadine (CLARITIN) 10 MG tablet Take 1 tablet (10 mg total) by mouth daily.  Marland Kitchen LUMIGAN 0.01 % SOLN Place 1 drop into both eyes 2 (two) times daily.   . Multiple Vitamins-Minerals (MULTIVITAMIN WITH MINERALS) tablet Take 1 tablet by mouth daily.  . Netarsudil Dimesylate 0.02 % SOLN Apply 1 drop to eye at bedtime. Right eye  . Probiotic Product (ALIGN) 4 MG CAPS Take by mouth daily.  . rosuvastatin (CRESTOR) 5 MG tablet TAKE 1 TABLET ONCE DAILY FOR CHOLESTEROL.  . SYNTHROID 125 MCG tablet TAKE 1 TABLET ONCE DAILY.  Marland Kitchen UNABLE TO FIND Take by  mouth as needed. Med Name: OTC purified peppermint oil for IBS - 3 caps before lunch and dinner  . UNABLE TO FIND Med Name:Hemeopathic ear drops  2 drops twice daily   No facility-administered encounter medications on file as of 11/23/2020.    Review of Systems:  Review of Systems  Constitutional: Negative for activity change, appetite change, chills, diaphoresis, fatigue, fever and unexpected weight change.  HENT: Negative for congestion.   Respiratory: Negative for cough, shortness of breath and wheezing.   Cardiovascular: Positive for leg swelling. Negative for chest pain and palpitations.  Gastrointestinal: Negative for abdominal distention, abdominal pain, constipation  and diarrhea.  Genitourinary: Positive for frequency. Negative for difficulty urinating, dysuria and flank pain.  Musculoskeletal: Positive for gait problem. Negative for arthralgias, back pain, joint swelling and myalgias.  Neurological: Positive for dizziness. Negative for tremors, seizures, syncope, facial asymmetry, speech difficulty, weakness, light-headedness, numbness and headaches.  Psychiatric/Behavioral: Negative for agitation, behavioral problems and confusion.    Health Maintenance  Topic Date Due  . PNA vac Low Risk Adult (2 of 2 - PCV13) 08/29/2014  . COVID-19 Vaccine (4 - Booster for Moderna series) 01/08/2021  . TETANUS/TDAP  06/19/2022  . INFLUENZA VACCINE  Completed  . DEXA SCAN  Completed  . HPV VACCINES  Aged Out    Physical Exam: Vitals:   11/23/20 1345  BP: (!) 156/78  Pulse: 70  Temp: 97.8 F (36.6 C)  TempSrc: Skin  SpO2: 97%  Weight: 141 lb 12.8 oz (64.3 kg)  Height: _0  (1.6 m)   Body mass index is 25.12 kg/m.  Wt Readings from Last 3 Encounters:  11/23/20 141 lb 12.8 oz (64.3 kg)  11/11/20 143 lb (64.9 kg)  07/15/20 132 lb (59.9 kg)    Physical Exam Vitals and nursing note reviewed.  Constitutional:      General: She is not in acute distress.    Appearance: She is  not diaphoretic.  HENT:     Head: Normocephalic and atraumatic.     Mouth/Throat:     Mouth: Mucous membranes are moist.     Pharynx: Oropharynx is clear. No oropharyngeal exudate.  Neck:     Vascular: No JVD.  Cardiovascular:     Rate and Rhythm: Normal rate and regular rhythm.     Heart sounds: No murmur heard.   Pulmonary:     Effort: Pulmonary effort is normal. No respiratory distress.     Breath sounds: Normal breath sounds. No wheezing.  Abdominal:     General: Abdomen is flat. Bowel sounds are normal. There is no distension.     Palpations: Abdomen is soft.     Tenderness: There is no abdominal tenderness.  Musculoskeletal:     Cervical back: No rigidity or tenderness.     Right lower leg: Edema (+1) present.     Left lower leg: Edema (+1) present.  Lymphadenopathy:     Cervical: No cervical adenopathy.  Skin:    General: Skin is warm and dry.  Neurological:     Mental Status: She is alert and oriented to person, place, and time.  Psychiatric:        Mood and Affect: Mood normal.     Labs reviewed: Basic Metabolic Panel: Recent Labs    06/25/20 0000  NA 141  K 4.2  CL 106  CO2 25*  BUN 15  CREATININE 0.5  CALCIUM 9.7  TSH 0.93   Liver Function Tests: Recent Labs    06/25/20 0000  AST 21  ALT 19  ALBUMIN 4.3   No results for input(s): LIPASE, AMYLASE in the last 8760 hours. No results for input(s): AMMONIA in the last 8760 hours. CBC: Recent Labs    06/25/20 0000  WBC 6.1  HGB 13.6  HCT 41  PLT 214   Lipid Panel: Recent Labs    06/25/20 0000  CHOL 163  HDL 76*  TRIG 108   Lab Results  Component Value Date   HGBA1C 5.6 06/25/2020    Procedures since last visit: No results found.  Assessment/Plan  1. Essential hypertension Elevated as she has not been taking  lasix which has led to increased edema and weight gain Recommend trying the lasix 20 mg qod (did not want to take daily due to frequency)and f/u if she continues to have  increased leg edema and/or dizziness   2. Chronic diastolic congestive heart failure (HCC) Mild on echo in 2021 Discussed findings She also has venous insuff and needs to continue the lasix and compression hose along with elevation   3. Dizziness This resolved on its own and was not present on exam today. I let her know if this comes up again to let us and as we may need to order additional labs or imaging.    Labs/tests ordered:  NA Next appt:  11/26/2020

## 2020-11-26 ENCOUNTER — Ambulatory Visit: Payer: Medicare Other

## 2020-11-26 ENCOUNTER — Other Ambulatory Visit: Payer: Self-pay

## 2020-11-26 DIAGNOSIS — M810 Age-related osteoporosis without current pathological fracture: Secondary | ICD-10-CM | POA: Diagnosis not present

## 2020-11-26 MED ORDER — DENOSUMAB 60 MG/ML ~~LOC~~ SOSY
60.0000 mg | PREFILLED_SYRINGE | Freq: Once | SUBCUTANEOUS | Status: AC
  Administered 2020-11-26: 60 mg via SUBCUTANEOUS

## 2020-12-03 ENCOUNTER — Other Ambulatory Visit: Payer: Self-pay | Admitting: Internal Medicine

## 2020-12-24 NOTE — Progress Notes (Signed)
[facility] New Patient Evaluation and Consultation  Referring Provider: Gayland Curry, DO PCP: Virgie Dad, MD Date of Service: 12/25/2020  SUBJECTIVE Chief Complaint: New Patient (Initial Visit) (incontinence)  History of Present Illness: Destiny [name] is a 87 y.o. White or Caucasian female seen in consultation at the request of Dr. Mariea Clonts for evaluation of urge incontinence.    Review of records significant for: More bothersome urgency and leakage. Not interested in surgery.   Urinary Symptoms: Leaks urine with cough/ sneeze, laughing, exercise, lifting, going from sitting to standing and with urgency Leaks 8-11 time(s) per day.  Pad use: several pads per day.   She is bothered by her UI symptoms.  Has been going on since last summer. Tried AZO but does not help.  Had a sling in 2003 with Dr Lawrence Santiago. In 2011, had an anterior and posterior repair with cystoscopy at [facility] with Dr Orinda Kenner.   Day time voids 12.  Nocturia: 1 times per night to void. Voiding dysfunction: she empties her bladder well.  does not use a catheter to empty bladder.  When urinating, she feels the need to urinate multiple times in a row Drinks: 1/2 glass tea (watered down), 1 cup coffee, 4 cups water per day  UTIs: 0 UTI's in the last year.   Denies history of blood in urine and kidney or bladder stones  Pelvic Organ Prolapse Symptoms:                  She Denies a feeling of a bulge the vaginal area.   Bowel Symptom: Bowel movements: 1 time(s) per day Stool consistency: hard Straining: yes.  Splinting: yes.  Incomplete evacuation: yes.  She Denies accidental bowel leakage / fecal incontinence Bowel regimen: fiber Last colonoscopy: Date- at age 76, Results normal  Sexual Function Sexually active: no.   Pelvic Pain Denies pelvic pain   Past Medical History:  Past Medical History:  Diagnosis Date  . Allergy   . Arthritis   . 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 Cataract   . Chronic anxiety   . Constipation   . Diverticulosis of sigmoid colon   . Emphysema of lung ([place])   . GERD (gastroesophageal reflux disease)   . Glaucoma   . H/O adenomatous polyp of colon    Dr. Cristina Gong  . High cholesterol   . HTN (hypertension) 07/17/2015   Echo 08/2019: EF 45-80, grade 1 diastolic dysfunction, mild apical HK, normal RV SF, trivial pericardial effusion, moderate MAC, mild MR, mild TR  . Hypothyroidism   . Insomnia   . Lactose intolerance   . Lumbar degenerative disc disease   . Myoview    Myoview 06/2019: EF 48, normal perfusion; low risk (EF normal by echo 10/2018)  . Neuromuscular disorder (HCC)    Pronated ankles /Feet   . Onychomycosis   . Osteoarthritis   . Osteoporosis   . Second degree Mobitz II AV block    s/p PPM  . Spinal stenosis   . Thyroid disease   . Varicose veins      Past Surgical History:   Past Surgical History:  Procedure Laterality Date  . ABDOMINAL HYSTERECTOMY    . BLADDER REPAIR    . blephoplasty bilateral  03/10/2014  . CATARACT EXTRACTION W/ INTRAOCULAR LENS  IMPLANT, BILATERAL  7 & 8 2014  . CHOLECYSTECTOMY    . COLONOSCOPY  05/18/2012  .  EP IMPLANTABLE DEVICE N/A 08/16/2016   MDT Advisa MRI conditional PPM implanted by Dr Rayann Heman for mobitz II second degree AV block  . EYE SURGERY Right 05/02/2018   laser eye surgery  . herniated disc repair    . INCONTINENCE SURGERY    . JOINT REPLACEMENT    . LAMINECTOMY  02/2011   L3-L5  Dr. Ellene Route  . LUMBAR LAMINECTOMY/DECOMPRESSION MICRODISCECTOMY Right 09/21/2015   Procedure: Right Lumbar Three-FourMicrodiskectomy;  Surgeon: Kristeen Miss, MD;  Location: [facility];  Service: Neurosurgery;  Laterality: Right;  Right L3-4 Microdiskectomy  . SHOULDER ARTHROSCOPY WITH ROTATOR CUFF REPAIR Right 08/2003  . TONSILLECTOMY    . TOTAL HIP ARTHROPLASTY     right 1999     Past OB/GYN History: G3 P3 Vaginal deliveries: 3,   Forceps/ Vacuum deliveries: 0, Cesarean section: 0 S/p hysterectomy   Medications: She has a current medication list which includes the following prescription(s): calcium-phosphorus-vitamin d, cyanocobalamin, diltiazem, dorzolamide-timolol, furosemide, ipratropium, irbesartan, lactase, loratadine, lumigan, multivitamin with minerals, align, synthroid, UNABLE TO FIND, UNABLE TO FIND, vibegron, netarsudil dimesylate, and rosuvastatin.   Allergies: Patient is allergic to codeine, macrobid [nitrofurantoin], miralax [polyethylene glycol], other, pain relief [acetaminophen], paxil [paroxetine hcl], percodan [oxycodone-aspirin], oxycodone, oxycodone-acetaminophen, and penicillins.   Social History:  Social History   Tobacco Use  . Smoking status: Never Smoker  . Smokeless tobacco: Never Used  Vaping Use  . Vaping Use: Never used  Substance Use Topics  . Alcohol use: No  . Drug use: No    She lives at [place].     Family History:   Family History  Problem Relation Age of Onset  . Arthritis Mother   . Osteoporosis Mother   . Heart attack Father      Review of Systems: Review of Systems  Constitutional: Negative for fever, malaise/fatigue and weight loss.  Respiratory: Negative for cough, shortness of breath and wheezing.   Cardiovascular: Negative for chest pain, palpitations and leg swelling.  Gastrointestinal: Negative for abdominal pain and blood in stool.  Genitourinary: Negative for dysuria.  Musculoskeletal: Negative for myalgias.  Skin: Negative for rash.  Neurological: Negative for dizziness and headaches.  Endo/Heme/Allergies: Does not bruise/bleed easily.  Psychiatric/Behavioral: Negative for depression. The patient is not nervous/anxious.      OBJECTIVE Physical Exam: Vitals:   12/25/20 1345  BP: (!) 169/75  Pulse: 78  Weight: 141 lb (64 kg)  Height: _0  (1.6 m)    Physical Exam Constitutional:      General: She is not in acute  distress. Pulmonary:     Effort: Pulmonary effort is normal.  Abdominal:     General: There is no distension.     Palpations: Abdomen is soft.     Tenderness: There is no abdominal tenderness. There is no rebound.  Musculoskeletal:        General: No swelling. Normal range of motion.  Skin:    General: Skin is warm and dry.     Findings: No rash.  Neurological:     Mental Status: She is alert and oriented to person, place, and time.  Psychiatric:        Mood and Affect: Mood normal.        Behavior: Behavior normal.      GU / Detailed Urogynecologic Evaluation:  Pelvic Exam: Normal external female genitalia; Bartholin's and Skene's glands normal in appearance; urethral meatus normal in appearance, no urethral masses or discharge.   CST: negative  s/p hysterectomy: Speculum  exam reveals normal vaginal mucosa with  atrophy and normal vaginal cuff.  Adnexa no mass, fullness, tenderness.  No evidence of mesh erosion.   Pelvic floor strength I/V  Pelvic floor musculature: Right levator tender, Right obturator tender, Left levator tender, Left obturator tender  POP-Q:   POP-Q  -2                                            Aa   -2                                           Ba  -7                                              C   3                                            Gh  4                                            Pb  8                                            tvl   -0.5                                            Ap  -0.5                                            Bp                                                 D     Rectal Exam:  Normal external rectum  Post-Void Residual (PVR) by Bladder Scan: In order to evaluate bladder emptying, we discussed obtaining a postvoid residual and she agreed to this procedure.  Procedure: The ultrasound unit was placed on the patient's abdomen in the suprapubic region after the patient had voided. A PVR of 24 ml was  obtained by bladder scan.  Laboratory Results: Unable to provide urine sample- voided prior to visit  ASSESSMENT AND PLAN Ms. Berwanger is a 87 y.o. with:  1. Overactive bladder   2. Urinary frequency   3. SUI (stress urinary incontinence, female)   4. Prolapse of posterior vaginal wall    1. OAB We discussed the symptoms of overactive bladder (OAB), which  include urinary urgency, urinary frequency, nocturia, with or without urge incontinence.  While we do not know the exact etiology of OAB, several treatment options exist. We discussed management including behavioral therapy (decreasing bladder irritants, urge suppression strategies, timed voids, bladder retraining), physical therapy, medication; for refractory cases posterior tibial nerve stimulation, sacral neuromodulation, and intravesical botulinum toxin injection.  - She is not a candidate for anticholinergic medications due to her age. Myrbetriq is also not an option due to her elevated blood pressure.  - Prescribed Gemtasa 16m daily. This does not appear to be covered by her insurance but we can try for prior authorization.  - She is also interested in a referral to pelvic PT.  - We discussed the option of tibial nerve stimulation if these do not improve her symptoms or if medication cannot get approved. This would require a weekly visit for 12 weeks and then monthly maintenance visits.  - she was unable to provide a urine sample today (voided prior to visit), but does not have any active symptoms of infection.   2. SUI For treatment of stress urinary incontinence,  non-surgical options include expectant management, physical therapy, as well as a pessary. We did not review surgical options.  - Will start with pelvic physical therapy, referral placed  3. Stage I anterior, Stage II posterior, Stage I apical prolapse - Has some recurrence of her posterior vaginal prolapse, however this is not bothersome to her at this time so we can  proceed with expectant management. We discussed that this is not likely causing her bladder symptoms.    MJaquita Folds MD   Medical Decision Making:  - Review and summation of prior records

## 2020-12-25 ENCOUNTER — Other Ambulatory Visit: Payer: Self-pay

## 2020-12-25 ENCOUNTER — Ambulatory Visit (INDEPENDENT_AMBULATORY_CARE_PROVIDER_SITE_OTHER): Payer: Medicare Other | Admitting: Obstetrics and Gynecology

## 2020-12-25 ENCOUNTER — Encounter: Payer: Self-pay | Admitting: Obstetrics and Gynecology

## 2020-12-25 VITALS — BP 169/75 | HR 78 | Ht 63.0 in | Wt 141.0 lb

## 2020-12-25 DIAGNOSIS — N816 Rectocele: Secondary | ICD-10-CM | POA: Diagnosis not present

## 2020-12-25 DIAGNOSIS — R350 Frequency of micturition: Secondary | ICD-10-CM | POA: Diagnosis not present

## 2020-12-25 DIAGNOSIS — N3281 Overactive bladder: Secondary | ICD-10-CM

## 2020-12-25 DIAGNOSIS — N393 Stress incontinence (female) (male): Secondary | ICD-10-CM | POA: Diagnosis not present

## 2020-12-25 MED ORDER — VIBEGRON 75 MG PO TABS: 1.0000 | ORAL_TABLET | Freq: Every day | ORAL | 5 refills | Status: DC

## 2020-12-25 NOTE — Patient Instructions (Signed)
We discussed the symptoms of overactive bladder (OAB), which include urinary urgency, urinary frequency, night-time urination, with or without urge incontinence.  We discussed management including behavioral therapy (decreasing bladder irritants by following a bladder diet, urge suppression strategies, timed voids, bladder retraining), physical therapy, medication; and for refractory cases posterior tibial nerve stimulation.  We have prescribed Gemtasa 75mg  daily for bladder symptoms. A referral was placed for physical therapy.

## 2021-01-06 ENCOUNTER — Ambulatory Visit (INDEPENDENT_AMBULATORY_CARE_PROVIDER_SITE_OTHER): Payer: Medicare Other

## 2021-01-06 DIAGNOSIS — I441 Atrioventricular block, second degree: Secondary | ICD-10-CM

## 2021-01-07 LAB — CUP PACEART REMOTE DEVICE CHECK
Battery Remaining Longevity: 64 mo
Battery Voltage: 3 V
Brady Statistic AP VP Percent: 13.64 %
Brady Statistic AP VS Percent: 0.01 %
Brady Statistic AS VP Percent: 86.11 %
Brady Statistic AS VS Percent: 0.25 %
Brady Statistic RA Percent Paced: 13.58 %
Brady Statistic RV Percent Paced: 99.49 %
Date Time Interrogation Session: 20220511104225
Implantable Lead Implant Date: 20171219
Implantable Lead Implant Date: 20171219
Implantable Lead Location: 753859
Implantable Lead Location: 753860
Implantable Lead Model: 5076
Implantable Lead Model: 5076
Implantable Pulse Generator Implant Date: 20171219
Lead Channel Impedance Value: 285 Ohm
Lead Channel Impedance Value: 323 Ohm
Lead Channel Impedance Value: 779 Ohm
Lead Channel Impedance Value: 855 Ohm
Lead Channel Pacing Threshold Amplitude: 0.625 V
Lead Channel Pacing Threshold Amplitude: 1.75 V
Lead Channel Pacing Threshold Pulse Width: 0.4 ms
Lead Channel Pacing Threshold Pulse Width: 0.4 ms
Lead Channel Sensing Intrinsic Amplitude: 3 mV
Lead Channel Sensing Intrinsic Amplitude: 3 mV
Lead Channel Sensing Intrinsic Amplitude: 6.875 mV
Lead Channel Sensing Intrinsic Amplitude: 6.875 mV
Lead Channel Setting Pacing Amplitude: 2 V
Lead Channel Setting Pacing Amplitude: 3.5 V
Lead Channel Setting Pacing Pulse Width: 0.4 ms
Lead Channel Setting Sensing Sensitivity: 2.8 mV

## 2021-01-21 ENCOUNTER — Other Ambulatory Visit: Payer: Self-pay | Admitting: *Deleted

## 2021-01-21 MED ORDER — IRBESARTAN 300 MG PO TABS: 300.0000 mg | ORAL_TABLET | Freq: Every day | ORAL | 0 refills | Status: DC

## 2021-01-21 NOTE — Telephone Encounter (Signed)
[facility] refill.

## 2021-01-28 NOTE — Progress Notes (Signed)
Remote pacemaker transmission.   

## 2021-02-23 ENCOUNTER — Other Ambulatory Visit: Payer: Self-pay | Admitting: Internal Medicine

## 2021-03-03 ENCOUNTER — Ambulatory Visit: Payer: Medicare Other | Attending: Obstetrics and Gynecology | Admitting: Physical Therapy

## 2021-03-03 ENCOUNTER — Other Ambulatory Visit: Payer: Self-pay

## 2021-03-03 DIAGNOSIS — M6281 Muscle weakness (generalized): Secondary | ICD-10-CM | POA: Diagnosis not present

## 2021-03-03 DIAGNOSIS — R293 Abnormal posture: Secondary | ICD-10-CM | POA: Insufficient documentation

## 2021-03-03 NOTE — Therapy (Signed)
[facility] [address], [place], [place], [address] Phone: [phone]   Fax:  [phone]  Physical Therapy Evaluation  Patient Details  Name: Destiny [name] MRN: [id number] Date of Birth: [birth year] Referring Provider (PT): Jaquita Folds, MD   Encounter Date: 03/03/2021   PT End of Session - 03/03/21 1554     Visit Number 1    Date for PT Re-Evaluation 05/26/21    Authorization Type medicare    Progress Note Due on Visit 10    PT Start Time 4917    PT Stop Time 9150    PT Time Calculation (min) 40 min    Activity Tolerance Patient tolerated treatment well    Behavior During Therapy [facility] for tasks assessed/performed             Past Medical History:  Diagnosis Date   Allergy    Arthritis    Bilateral carotid bruits 03/2003   [place] 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 Cristina Gong   High cholesterol    HTN (hypertension) 07/17/2015   Echo 08/2019: EF 56-97, grade 1 diastolic dysfunction, mild apical HK, normal RV SF, trivial pericardial effusion, moderate MAC, mild MR, mild TR   Hypothyroidism    Insomnia    Lactose intolerance    Lumbar degenerative disc disease    Myoview    Myoview 06/2019: EF 48, normal perfusion; low risk (EF normal by echo 10/2018)   Neuromuscular disorder (HCC)    Pronated ankles /Feet    Onychomycosis    Osteoarthritis    Osteoporosis    Second degree Mobitz II AV block    s/p PPM   Spinal stenosis    Thyroid disease    Varicose veins     Past Surgical History:  Procedure Laterality Date   ABDOMINAL HYSTERECTOMY     BLADDER REPAIR     blephoplasty bilateral  03/10/2014   CATARACT EXTRACTION W/ INTRAOCULAR LENS  IMPLANT, BILATERAL  7 & 8 2014    CHOLECYSTECTOMY     COLONOSCOPY  05/18/2012   EP IMPLANTABLE DEVICE N/A 08/16/2016   MDT Advisa MRI conditional PPM implanted by Dr Rayann Heman for mobitz II second degree AV block   EYE SURGERY Right 05/02/2018   laser eye surgery   herniated disc repair     INCONTINENCE SURGERY     JOINT REPLACEMENT     LAMINECTOMY  02/2011   L3-L5  Dr. Ellene Route   LUMBAR LAMINECTOMY/DECOMPRESSION MICRODISCECTOMY Right 09/21/2015   Procedure: Right Lumbar Three-FourMicrodiskectomy;  Surgeon: Kristeen Miss, MD;  Location: [facility];  Service: Neurosurgery;  Laterality: Right;  Right L3-4 Microdiskectomy   SHOULDER ARTHROSCOPY WITH ROTATOR CUFF REPAIR Right 08/2003   TONSILLECTOMY     TOTAL HIP ARTHROPLASTY     right 1999    There were no vitals filed for this visit.    Subjective Assessment - 03/03/21 1401     Subjective Per chart review pt Leaks urine with cough/ sneeze, laughing, exercise, lifting, going from sitting to standing and with urgency  Leaks 8-11 time(s) per day.  Pad use: several pads per day.    She is bothered by her UI symptoms.  Has  been going on since last summer.  The new medicine that was given is helping and now only leaks when trying to get to the bathroom.  I usually leak if I drink water and sit 2 hours then I might leak on the way to the bathroom.    Pertinent History abdominal hysterectomy, bladder repair, lumbar DDD, compression fracture    Patient Stated Goals if she can go longer than going to the bathroom    Currently in Pain? No/denies                [facility] PT Assessment - 03/04/21 0001       Assessment   Medical Diagnosis N39.3 (ICD-10-CM) - SUI (stress urinary incontinence, female);N32.81 (ICD-10-CM) - Overactive bladder    Referring Provider (PT) Jaquita Folds, MD    Onset Date/Surgical Date --   last year early August   Prior Therapy No      Precautions   Precautions None      Restrictions   Weight Bearing Restrictions No      Balance Screen   Has  the patient fallen in the past 6 months No      [place] residence    Living Arrangements Alone      Prior Function   Level of Independence Independent    Leisure reading      Cognition   Overall Cognitive Status Within Functional Limits for tasks assessed      Posture/Postural Control   Posture/Postural Control Postural limitations    Postural Limitations Forward head;Increased thoracic kyphosis;Flexed trunk      ROM / Strength   AROM / PROM / Strength AROM;Strength      AROM   Overall AROM Comments Rt hip 80%      Strength   Overall Strength Comments LE grossly 4+/5      Flexibility   Soft Tissue Assessment /Muscle Length yes    Hamstrings 80%      Ambulation/Gait   Ambulation/Gait Yes    Assistive device 4-wheeled walker    Gait Pattern Step-through pattern;Decreased stride length;Trunk flexed                        Objective measurements completed on examination: See above findings.     Pelvic Floor Special Questions - 03/04/21 0001     Prior Pelvic/Prostate Exam Yes    Prior Pregnancies Yes    Number of Vaginal Deliveries 3    Urinary Leakage Yes    Pad use 1/day    Activities that cause leaking With strong urge;Walking   walking to the bathroom   Urinary frequency every 2 hours    Fecal incontinence --   straining   Fluid intake 30 oz    Falling out feeling (prolapse) No    External Palpation 3 sec hold kegel    Exam Type Deferred                      PT Education - 03/03/21 1551     Education Details gave handout with back support brace info    Person(s) Educated Patient    Methods Explanation;Handout;Demonstration;Verbal cues    Comprehension Verbalized understanding              PT Short Term Goals - 03/03/21 1604       PT SHORT TERM GOAL #1   Title ind with initial HEP  Time 4    Period Weeks    Status New    Target Date 03/31/21      PT SHORT TERM GOAL #2    Title Pt will demonstrate 5/5 hip strength for impoved gait    Time 4    Period Weeks    Status New    Target Date 03/31/21               PT Long Term Goals - 03/03/21 1414       PT LONG TERM GOAL #1   Title pt will notice she is having less shortness of breath when walking around the community    Baseline SOB walking from front office to back room    Time 12    Period Weeks    Status New    Target Date 05/26/21      PT LONG TERM GOAL #2   Title Being able to get to the bathroom without leakage even after sitting for 2 hours    Time 12    Period Weeks    Status New    Target Date 05/26/21      PT LONG TERM GOAL #3   Title Pt will be able to demonstrate improved upright posture due to improved core and back strength and mobility    Time 12    Period Weeks    Status New    Target Date 05/26/21      PT LONG TERM GOAL #4   Title Pt will be ind with HEP and able to return to aquatics classes that she was previously attending    Time 12    Period Weeks    Status New    Target Date 05/26/21                    Plan - 03/03/21 1607     Clinical Impression Statement Pt arrives to clinic today due to urinary leakage that seems to have begun around the time that she fractured L3 vertebrea.  Most noticeably pt ambulates with rollator and very flexed posture and back supporting brace on.  Pt has excess thoracic kyphosis.  pt has LE grossly 4+/5.  Pt reports no falls and is able to walk without rollator but has more back pain when she does.  Pt was able to perform kegel for 3 second hold and was assessed externally so no MMT was established.  Pt will benefit from skilled PT to address posture and hip strength for improved overall funciton and to reduce pressure on bladder and other pelvic organs.  Pt would benefit from aquatic therapy to be a part of treatment as she would like to get back to the pool to do exercises in the pool where she lives.    Personal Factors and  Comorbidities Comorbidity 3+    Comorbidities abdominal hysterectomy, bladder repair, lumbar DDD    Examination-Activity Limitations Toileting;Continence;Sit    Examination-Participation Restrictions Community Activity    Stability/Clinical Decision Making Evolving/Moderate complexity    Clinical Decision Making Moderate    Rehab Potential Excellent    PT Frequency 2x / week    PT Duration 12 weeks    PT Treatment/Interventions ADLs/Self Care Home Management;Aquatic Therapy;Biofeedback;Cryotherapy;Electrical Stimulation;Moist Heat;Neuromuscular re-education;Therapeutic exercise;Therapeutic activities;Gait training;Manual techniques;Taping;Patient/family education    PT Next Visit Plan pt would like to get back to aquatic therapy - training in pool would be beneficial; pelvic floor strength; posture; hip strength; gait    Consulted and Agree  with Plan of Care Patient             Patient will benefit from skilled therapeutic intervention in order to improve the following deficits and impairments:  Abnormal gait, Postural dysfunction, Decreased strength, Impaired flexibility  Visit Diagnosis: Abnormal posture  Muscle weakness (generalized)     Problem List Patient Active Problem List   Diagnosis Date Noted   Hyperglycemia 03/18/2020   Flat foot 03/18/2020   Kyphoscoliosis 10/30/2019   Closed compression fracture of L3 lumbar vertebra, initial encounter ([place]) 10/30/2019   Venous insufficiency of both lower extremities 10/30/2019   Balance problem 10/30/2019   Aortic atherosclerosis ([place]) 11/07/2018   Tongue lesion 05/24/2017   S/P placement of cardiac pacemaker 01/11/2017   Second degree Mobitz II AV block 08/16/2016   General weakness 08/15/2016   Bradycardia 08/15/2016   Gastroesophageal reflux disease without esophagitis 02/03/2016   Hiatal hernia 02/03/2016   Allergic sinusitis 02/03/2016   Senile osteoporosis 02/03/2016   DDD (degenerative disc disease), lumbar  02/03/2016   Hypothyroidism due to acquired atrophy of thyroid 02/03/2016   Frequent loose stools 02/03/2016   Basal cell carcinoma of lower back 02/03/2016   Lactose intolerance in adult 02/03/2016   Glaucoma 02/03/2016   Herniated nucleus pulposus, L3-4 right 09/21/2015   Piriformis syndrome 07/17/2015   Constipation 07/17/2015   HLD (hyperlipidemia) 07/17/2015   Anxiety state 07/17/2015   HTN (hypertension) 07/17/2015    Camillo Flaming Aliannah Holstrom, PT 03/04/2021, 10:14 AM  [facility] [address] [place], [place], [address] Phone: [phone]   Fax:  [phone]  Name: FRANCIE KEELING MRN: [id number] Date of Birth: 12/24/1927

## 2021-03-04 ENCOUNTER — Encounter: Payer: Self-pay | Admitting: Physical Therapy

## 2021-03-10 ENCOUNTER — Other Ambulatory Visit: Payer: Self-pay

## 2021-03-10 ENCOUNTER — Encounter: Payer: Self-pay | Admitting: Physical Therapy

## 2021-03-10 ENCOUNTER — Ambulatory Visit: Payer: Medicare Other | Admitting: Physical Therapy

## 2021-03-10 DIAGNOSIS — M6281 Muscle weakness (generalized): Secondary | ICD-10-CM

## 2021-03-10 DIAGNOSIS — R293 Abnormal posture: Secondary | ICD-10-CM | POA: Diagnosis not present

## 2021-03-10 NOTE — Therapy (Signed)
[facility] [address], [place], [place], [address] Phone: [phone]   Fax:  [phone]  Physical Therapy Treatment  Patient Details  Name: Destiny [name] MRN: [id number] Date of Birth: February 21, 1928 Referring Provider (PT): Jaquita Folds, MD   Encounter Date: 03/10/2021   PT End of Session - 03/10/21 1055     Visit Number 2    Date for PT Re-Evaluation 05/26/21    Authorization Type medicare    Progress Note Due on Visit 10    PT Start Time 1056    PT Stop Time 1140    PT Time Calculation (min) 44 min    Activity Tolerance Patient tolerated treatment well    Behavior During Therapy [facility] for tasks assessed/performed             Past Medical History:  Diagnosis Date   Allergy    Arthritis    Bilateral carotid bruits 03/2003   [place] 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 Cristina Gong   High cholesterol    HTN (hypertension) 07/17/2015   Echo 08/2019: EF 67-54, grade 1 diastolic dysfunction, mild apical HK, normal RV SF, trivial pericardial effusion, moderate MAC, mild MR, mild TR   Hypothyroidism    Insomnia    Lactose intolerance    Lumbar degenerative disc disease    Myoview    Myoview 06/2019: EF 48, normal perfusion; low risk (EF normal by echo 10/2018)   Neuromuscular disorder (HCC)    Pronated ankles /Feet    Onychomycosis    Osteoarthritis    Osteoporosis    Second degree Mobitz II AV block    s/p PPM   Spinal stenosis    Thyroid disease    Varicose veins     Past Surgical History:  Procedure Laterality Date   ABDOMINAL HYSTERECTOMY     BLADDER REPAIR     blephoplasty bilateral  03/10/2014   CATARACT EXTRACTION W/ INTRAOCULAR LENS  IMPLANT, BILATERAL  7 & 8 2014    CHOLECYSTECTOMY     COLONOSCOPY  05/18/2012   EP IMPLANTABLE DEVICE N/A 08/16/2016   MDT Advisa MRI conditional PPM implanted by Dr Rayann Heman for mobitz II second degree AV block   EYE SURGERY Right 05/02/2018   laser eye surgery   herniated disc repair     INCONTINENCE SURGERY     JOINT REPLACEMENT     LAMINECTOMY  02/2011   L3-L5  Dr. Ellene Route   LUMBAR LAMINECTOMY/DECOMPRESSION MICRODISCECTOMY Right 09/21/2015   Procedure: Right Lumbar Three-FourMicrodiskectomy;  Surgeon: Kristeen Miss, MD;  Location: [facility];  Service: Neurosurgery;  Laterality: Right;  Right L3-4 Microdiskectomy   SHOULDER ARTHROSCOPY WITH ROTATOR CUFF REPAIR Right 08/2003   TONSILLECTOMY     TOTAL HIP ARTHROPLASTY     right 1999    There were no vitals filed for this visit.   Subjective Assessment - 03/10/21 1242     Subjective I'm already tired just getting here to the pool. I want to get back to the pool at [facility]    Pertinent History abdominal hysterectomy, bladder repair, lumbar DDD, compression fracture    Patient Stated Goals if she can go longer than going to  the bathroom    Currently in Pain? No/denies    Multiple Pain Sites No            Treatment: Aquatics: Patient seen for aquatic therapy today.  Treatment took place in water 2.5-4 feet deep depending upon activity.  Pt entered and exited the pool via stairs, step to step with moderate use of the rails.  Seated water bench with 75% submersion Pt performed seated LE AROM exercises 20x in all planes, concurrent education on water principles and review of her goal to get back to [facility] pool.  Water walking with medium noodle for postural support. 4x short distance of the pool all 4 directions. Wall exercises including: heel raises 2x10, gastroc stretch Bil 2x 20 sec, hip circumduction 10x Bil , and mini squats 10x. UE weights for shoulder add/abd 10x standing against wall for support  Hamstring stretch at 2nd step Bil with ankle DF/PF 2x10  Bil                           PT Education - 03/10/21 1243     Education Details Water principles, what depth is best for her to exercise in    Person(s) Educated Patient    Methods Explanation;Demonstration    Comprehension Verbalized understanding;Returned demonstration              PT Short Term Goals - 03/03/21 1604       PT SHORT TERM GOAL #1   Title ind with initial HEP    Time 4    Period Weeks    Status New    Target Date 03/31/21      PT SHORT TERM GOAL #2   Title Pt will demonstrate 5/5 hip strength for impoved gait    Time 4    Period Weeks    Status New    Target Date 03/31/21               PT Long Term Goals - 03/03/21 1414       PT LONG TERM GOAL #1   Title pt will notice she is having less shortness of breath when walking around the community    Baseline SOB walking from front office to back room    Time 12    Period Weeks    Status New    Target Date 05/26/21      PT LONG TERM GOAL #2   Title Being able to get to the bathroom without leakage even after sitting for 2 hours    Time 12    Period Weeks    Status New    Target Date 05/26/21      PT LONG TERM GOAL #3   Title Pt will be able to demonstrate improved upright posture due to improved core and back strength and mobility    Time 12    Period Weeks    Status New    Target Date 05/26/21      PT LONG TERM GOAL #4   Title Pt will be ind with HEP and able to return to aquatics classes that she was previously attending    Time 12    Period Weeks    Status New    Target Date 05/26/21                   Plan - 03/10/21 1245     Clinical Impression Statement Pt arrives for her first aquatic  PT session with no pain. She does report being fatigued from the "process" of getting from the parking lot to the pool. Pt required WC assistance to and from the pool. Pt was educated in water principles and appropriate depth in order to facilitate her return back  to water exercise at [facility]. Pt requires medium noodle to facilitate better posture/back support. Pt is highly motivated to get back to her pool. Pt could return demonstrate all basic beginner aquatic exercises. Pt will require handouts to help her remember.    Personal Factors and Comorbidities Comorbidity 3+    Comorbidities abdominal hysterectomy, bladder repair, lumbar DDD    Examination-Activity Limitations Toileting;Continence;Sit    Examination-Participation Restrictions Community Activity    Stability/Clinical Decision Making Evolving/Moderate complexity    Rehab Potential Excellent    PT Frequency 2x / week    PT Duration 12 weeks    PT Treatment/Interventions ADLs/Self Care Home Management;Aquatic Therapy;Biofeedback;Cryotherapy;Electrical Stimulation;Moist Heat;Neuromuscular re-education;Therapeutic exercise;Therapeutic activities;Gait training;Manual techniques;Taping;Patient/family education    PT Next Visit Plan pt would like to get back to aquatic therapy - training in pool would be beneficial; pelvic floor strength; posture; hip strength; gait    Consulted and Agree with Plan of Care Patient             Patient will benefit from skilled therapeutic intervention in order to improve the following deficits and impairments:  Abnormal gait, Postural dysfunction, Decreased strength, Impaired flexibility  Visit Diagnosis: Abnormal posture  Muscle weakness (generalized)     Problem List Patient Active Problem List   Diagnosis Date Noted   Hyperglycemia 03/18/2020   Flat foot 03/18/2020   Kyphoscoliosis 10/30/2019   Closed compression fracture of L3 lumbar vertebra, initial encounter ([place]) 10/30/2019   Venous insufficiency of both lower extremities 10/30/2019   Balance problem 10/30/2019   Aortic atherosclerosis ([place]) 11/07/2018   Tongue lesion 05/24/2017   S/P placement of cardiac pacemaker 01/11/2017   Second degree Mobitz II AV block 08/16/2016   General  weakness 08/15/2016   Bradycardia 08/15/2016   Gastroesophageal reflux disease without esophagitis 02/03/2016   Hiatal hernia 02/03/2016   Allergic sinusitis 02/03/2016   Senile osteoporosis 02/03/2016   DDD (degenerative disc disease), lumbar 02/03/2016   Hypothyroidism due to acquired atrophy of thyroid 02/03/2016   Frequent loose stools 02/03/2016   Basal cell carcinoma of lower back 02/03/2016   Lactose intolerance in adult 02/03/2016   Glaucoma 02/03/2016   Herniated nucleus pulposus, L3-4 right 09/21/2015   Piriformis syndrome 07/17/2015   Constipation 07/17/2015   HLD (hyperlipidemia) 07/17/2015   Anxiety state 07/17/2015   HTN (hypertension) 07/17/2015    Challen Spainhour, PTA 03/10/2021, 12:49 PM  [facility] [address], [place], [place], [address] Phone: [phone]   Fax:  [phone]  Name: Destiny [name] MRN: [id number] Date of Birth: [birth year]

## 2021-03-11 ENCOUNTER — Encounter: Payer: Self-pay | Admitting: Physical Therapy

## 2021-03-11 ENCOUNTER — Ambulatory Visit: Payer: Medicare Other | Admitting: Physical Therapy

## 2021-03-11 DIAGNOSIS — M6281 Muscle weakness (generalized): Secondary | ICD-10-CM | POA: Diagnosis not present

## 2021-03-11 DIAGNOSIS — E034 Atrophy of thyroid (acquired): Secondary | ICD-10-CM | POA: Diagnosis not present

## 2021-03-11 DIAGNOSIS — R293 Abnormal posture: Secondary | ICD-10-CM

## 2021-03-11 DIAGNOSIS — I872 Venous insufficiency (chronic) (peripheral): Secondary | ICD-10-CM | POA: Diagnosis not present

## 2021-03-11 DIAGNOSIS — R739 Hyperglycemia, unspecified: Secondary | ICD-10-CM | POA: Diagnosis not present

## 2021-03-11 LAB — BASIC METABOLIC PANEL
BUN: 12 (ref 4–21)
CO2: 23 — AB (ref 13–22)
Chloride: 105 (ref 99–108)
Creatinine: 0.5 (ref 0.5–1.1)
Glucose: 104
Potassium: 4.1 (ref 3.4–5.3)
Sodium: 141 (ref 137–147)

## 2021-03-11 LAB — CBC: RBC: 4.39 (ref 3.87–5.11)

## 2021-03-11 LAB — COMPREHENSIVE METABOLIC PANEL: Calcium: 10 (ref 8.7–10.7)

## 2021-03-11 LAB — HEMOGLOBIN A1C: Hemoglobin A1C: 5.8

## 2021-03-11 LAB — CBC AND DIFFERENTIAL
HCT: 39 (ref 36–46)
Hemoglobin: 13 (ref 12.0–16.0)
Platelets: 211 (ref 150–399)
WBC: 5.5

## 2021-03-11 NOTE — Therapy (Signed)
[facility] [address], [place], [place], [address] Phone: [phone]   Fax:  [phone]  Physical Therapy Treatment  Patient Details  Name: Destiny [name] MRN: [id number] Date of Birth: 1928-03-10 Referring Provider (PT): Jaquita Folds, MD   Encounter Date: 03/11/2021   PT End of Session - 03/11/21 1626     Visit Number 3    Date for PT Re-Evaluation 05/26/21    Authorization Type medicare    Progress Note Due on Visit 10    PT Start Time 4627    PT Stop Time 1648    PT Time Calculation (min) 33 min    Activity Tolerance Patient tolerated treatment well    Behavior During Therapy [facility] for tasks assessed/performed             Past Medical History:  Diagnosis Date   Allergy    Arthritis    Bilateral carotid bruits 03/2003   [place] 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 Cristina Gong   High cholesterol    HTN (hypertension) 07/17/2015   Echo 08/2019: EF 03-50, grade 1 diastolic dysfunction, mild apical HK, normal RV SF, trivial pericardial effusion, moderate MAC, mild MR, mild TR   Hypothyroidism    Insomnia    Lactose intolerance    Lumbar degenerative disc disease    Myoview    Myoview 06/2019: EF 48, normal perfusion; low risk (EF normal by echo 10/2018)   Neuromuscular disorder (HCC)    Pronated ankles /Feet    Onychomycosis    Osteoarthritis    Osteoporosis    Second degree Mobitz II AV block    s/p PPM   Spinal stenosis    Thyroid disease    Varicose veins     Past Surgical History:  Procedure Laterality Date   ABDOMINAL HYSTERECTOMY     BLADDER REPAIR     blephoplasty bilateral  03/10/2014   CATARACT EXTRACTION W/ INTRAOCULAR LENS  IMPLANT, BILATERAL  7 & 8 2014    CHOLECYSTECTOMY     COLONOSCOPY  05/18/2012   EP IMPLANTABLE DEVICE N/A 08/16/2016   MDT Advisa MRI conditional PPM implanted by Dr Rayann Heman for mobitz II second degree AV block   EYE SURGERY Right 05/02/2018   laser eye surgery   herniated disc repair     INCONTINENCE SURGERY     JOINT REPLACEMENT     LAMINECTOMY  02/2011   L3-L5  Dr. Ellene Route   LUMBAR LAMINECTOMY/DECOMPRESSION MICRODISCECTOMY Right 09/21/2015   Procedure: Right Lumbar Three-FourMicrodiskectomy;  Surgeon: Kristeen Miss, MD;  Location: [facility];  Service: Neurosurgery;  Laterality: Right;  Right L3-4 Microdiskectomy   SHOULDER ARTHROSCOPY WITH ROTATOR CUFF REPAIR Right 08/2003   TONSILLECTOMY     TOTAL HIP ARTHROPLASTY     right 1999    There were no vitals filed for this visit.   Subjective Assessment - 03/11/21 1622     Subjective Pt states she is in less pain after moving around, but this morning pain was 5/10.   I was a little sore after the pool yesterday, it was a long walk to get there.    Patient Stated Goals if she can go longer  than going to the bathroom    Currently in Pain? No/denies                               [facility] Adult PT Treatment/Exercise - 03/11/21 0001       Exercises   Exercises Lumbar      Lumbar Exercises: Stretches   Lower Trunk Rotation 5 reps;10 seconds      Lumbar Exercises: Seated   Other Seated Lumbar Exercises scap squeeze with ball behind      Lumbar Exercises: Supine   Bridge with Ball Squeeze Limitations pelvic tilt; kegel with ball squeeze - 20x each TC and VC needed    Other Supine Lumbar Exercises W with yellow band; open book stretch 10x    Other Supine Lumbar Exercises isometric hip extension into mat - 10x                    PT Education - 03/11/21 1703     Education Details Access Code: [email or web address]    Person(s) Educated Patient    Methods Explanation;Demonstration;Tactile cues;Verbal cues;Handout    Comprehension Verbalized  understanding;Returned demonstration              PT Short Term Goals - 03/03/21 1604       PT SHORT TERM GOAL #1   Title ind with initial HEP    Time 4    Period Weeks    Status New    Target Date 03/31/21      PT SHORT TERM GOAL #2   Title Pt will demonstrate 5/5 hip strength for impoved gait    Time 4    Period Weeks    Status New    Target Date 03/31/21               PT Long Term Goals - 03/03/21 1414       PT LONG TERM GOAL #1   Title pt will notice she is having less shortness of breath when walking around the community    Baseline SOB walking from front office to back room    Time 12    Period Weeks    Status New    Target Date 05/26/21      PT LONG TERM GOAL #2   Title Being able to get to the bathroom without leakage even after sitting for 2 hours    Time 12    Period Weeks    Status New    Target Date 05/26/21      PT LONG TERM GOAL #3   Title Pt will be able to demonstrate improved upright posture due to improved core and back strength and mobility    Time 12    Period Weeks    Status New    Target Date 05/26/21      PT LONG TERM GOAL #4   Title Pt will be ind with HEP and able to return to aquatics classes that she was previously attending    Time 12    Period Weeks    Status New    Target Date 05/26/21                   Plan - 03/11/21 1654     Clinical Impression Statement Pt reports no pain currently but did have this morning.  Pt and daughter were educated on posture brace that she can use to help her regain  improved upright posture.  Pt did well with extension exercises.  TC and VC used to assist in her activating the intended target muscles.  Needs cues to relax all the way when doing kegels.  Pt will benefit from skilled PT to continue to progress with posture and strength for reducing risk of fractures and improved bladder control    PT Treatment/Interventions ADLs/Self Care Home Management;Aquatic  Therapy;Biofeedback;Cryotherapy;Electrical Stimulation;Moist Heat;Neuromuscular re-education;Therapeutic exercise;Therapeutic activities;Gait training;Manual techniques;Taping;Patient/family education    PT Next Visit Plan continue to strengthen for improved posutre and reduced risk of stress fractures in spine and core/pelvic floor strength for bladder control; hip strength and pre-gait activities    Consulted and Agree with Plan of Care Patient             Patient will benefit from skilled therapeutic intervention in order to improve the following deficits and impairments:  Abnormal gait, Postural dysfunction, Decreased strength, Impaired flexibility  Visit Diagnosis: Abnormal posture  Muscle weakness (generalized)     Problem List Patient Active Problem List   Diagnosis Date Noted   Hyperglycemia 03/18/2020   Flat foot 03/18/2020   Kyphoscoliosis 10/30/2019   Closed compression fracture of L3 lumbar vertebra, initial encounter ([place]) 10/30/2019   Venous insufficiency of both lower extremities 10/30/2019   Balance problem 10/30/2019   Aortic atherosclerosis ([place]) 11/07/2018   Tongue lesion 05/24/2017   S/P placement of cardiac pacemaker 01/11/2017   Second degree Mobitz II AV block 08/16/2016   General weakness 08/15/2016   Bradycardia 08/15/2016   Gastroesophageal reflux disease without esophagitis 02/03/2016   Hiatal hernia 02/03/2016   Allergic sinusitis 02/03/2016   Senile osteoporosis 02/03/2016   DDD (degenerative disc disease), lumbar 02/03/2016   Hypothyroidism due to acquired atrophy of thyroid 02/03/2016   Frequent loose stools 02/03/2016   Basal cell carcinoma of lower back 02/03/2016   Lactose intolerance in adult 02/03/2016   Glaucoma 02/03/2016   Herniated nucleus pulposus, L3-4 right 09/21/2015   Piriformis syndrome 07/17/2015   Constipation 07/17/2015   HLD (hyperlipidemia) 07/17/2015   Anxiety state 07/17/2015   HTN (hypertension) 07/17/2015     Camillo Flaming Stepheni Cameron, PT 03/11/2021, 5:03 PM  [facility] [address] [place], [place], [address] Phone: [phone]   Fax:  [phone]  Name: ULAH OLMO MRN: [id number] Date of Birth: [birth year]

## 2021-03-11 NOTE — Patient Instructions (Signed)
Access Code: [email or web address] URL: [email or web address] Date: 03/11/2021 Prepared by: Jari Favre  Exercises Supine Hip Adductor Squeeze with Small Ball - 3 x daily - 7 x weekly - 1 sets - 10 reps - 3 sec hold Supine Shoulder External Rotation with Resistance - 1 x daily - 7 x weekly - 2 sets - 10 reps Supine Hip Extension with Towel Roll (BKA) - 1 x daily - 7 x weekly - 2 sets - 10 reps - 5sec hold Supine Lower Trunk Rotation - 1 x daily - 7 x weekly - 1 sets - 10 reps - 5 sec hold Open Book Chest Stretch on Towel Roll - 1 x daily - 7 x weekly - 1 sets - 5 reps - 10 hold Seated Shoulder Setting - 1 x daily - 7 x weekly - 3 sets - 10 reps

## 2021-03-17 ENCOUNTER — Encounter: Payer: Self-pay | Admitting: Internal Medicine

## 2021-03-17 ENCOUNTER — Ambulatory Visit: Payer: Medicare Other | Admitting: Obstetrics and Gynecology

## 2021-03-17 ENCOUNTER — Non-Acute Institutional Stay: Payer: Medicare Other | Admitting: Internal Medicine

## 2021-03-17 ENCOUNTER — Other Ambulatory Visit: Payer: Self-pay

## 2021-03-17 VITALS — BP 162/72 | HR 79 | Temp 97.9°F | Ht 63.0 in | Wt 137.2 lb

## 2021-03-17 DIAGNOSIS — I5032 Chronic diastolic (congestive) heart failure: Secondary | ICD-10-CM

## 2021-03-17 DIAGNOSIS — I10 Essential (primary) hypertension: Secondary | ICD-10-CM

## 2021-03-17 DIAGNOSIS — N3941 Urge incontinence: Secondary | ICD-10-CM | POA: Diagnosis not present

## 2021-03-17 DIAGNOSIS — M159 Polyosteoarthritis, unspecified: Secondary | ICD-10-CM

## 2021-03-17 DIAGNOSIS — M810 Age-related osteoporosis without current pathological fracture: Secondary | ICD-10-CM

## 2021-03-17 DIAGNOSIS — I872 Venous insufficiency (chronic) (peripheral): Secondary | ICD-10-CM | POA: Diagnosis not present

## 2021-03-17 DIAGNOSIS — M8949 Other hypertrophic osteoarthropathy, multiple sites: Secondary | ICD-10-CM | POA: Diagnosis not present

## 2021-03-17 MED ORDER — METOPROLOL SUCCINATE ER 25 MG PO TB24: 12.5000 mg | ORAL_TABLET | Freq: Every day | ORAL | 2 refills | Status: DC

## 2021-03-17 NOTE — Progress Notes (Signed)
Location:  [place] of Service:  Clinic (12)  Provider:   Code Status:  Goals of Care:  Advanced Directives 03/17/2021  Does Patient Have a Medical Advance Directive? Yes  Type of Paramedic of [place];Living will  Does patient want to make changes to medical advance directive? No - Patient declined  Copy of [place] in Chart? (No Data)  Pre-existing out of facility DNR order (yellow form or pink MOST form) -     Chief Complaint  Patient presents with   Medical Management of Chronic Issues    Patient returns to the clinic for follow up.     HPI: Patient is a 87 y.o. female seen today for medical management of chronic diseases.    Her active issues Hypertension Patient states that her blood pressure at home also has been running around 160/90. 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 Cristina Gong   High cholesterol    HTN (hypertension) 07/17/2015   Echo 08/2019: EF 69-48, grade 1 diastolic dysfunction, mild apical HK, normal RV SF, trivial pericardial effusion, moderate MAC, mild MR, mild TR   Hypothyroidism    Insomnia    Lactose intolerance    Lumbar degenerative disc disease    Myoview    Myoview 06/2019: EF 48, normal perfusion;  low risk (EF normal by echo 10/2018)   Neuromuscular disorder (HCC)    Pronated ankles /Feet    Onychomycosis    Osteoarthritis    Osteoporosis    Second degree Mobitz II AV block    s/p PPM   Spinal stenosis    Thyroid disease    Varicose veins     Past Surgical History:  Procedure Laterality Date   ABDOMINAL HYSTERECTOMY     BLADDER REPAIR     blephoplasty bilateral  03/10/2014   CATARACT EXTRACTION W/ INTRAOCULAR LENS  IMPLANT, BILATERAL  7 & 8 2014   CHOLECYSTECTOMY     COLONOSCOPY  05/18/2012   EP IMPLANTABLE DEVICE N/A 08/16/2016   MDT Advisa MRI conditional PPM implanted by Dr Rayann Heman for mobitz II second degree AV block   EYE SURGERY Right 05/02/2018   laser eye surgery   herniated disc repair     INCONTINENCE SURGERY     JOINT REPLACEMENT     LAMINECTOMY  02/2011   L3-L5  Dr. Ellene Route   LUMBAR LAMINECTOMY/DECOMPRESSION MICRODISCECTOMY Right 09/21/2015   Procedure: Right Lumbar Three-FourMicrodiskectomy;  Surgeon: Kristeen Miss, MD;  Location: [facility];  Service: Neurosurgery;  Laterality: Right;  Right L3-4 Microdiskectomy   SHOULDER ARTHROSCOPY WITH ROTATOR CUFF REPAIR Right 08/2003   TONSILLECTOMY     TOTAL HIP ARTHROPLASTY  right 1999    Allergies  Allergen Reactions   Codeine Nausea Only   Macrobid [Nitrofurantoin] Diarrhea   Miralax [Polyethylene Glycol] Other (See Comments)    cramping   Other Nausea And Vomiting and Other (See Comments)   Pain Relief [Acetaminophen] Nausea And Vomiting    Patient states she is not allergic    Paxil [Paroxetine Hcl] Nausea Only   Percodan [Oxycodone-Aspirin] Itching   Oxycodone Nausea Only   Oxycodone-Acetaminophen Nausea Only   Penicillins Rash    Has patient had a PCN reaction causing immediate rash, facial/tongue/throat swelling, SOB or lightheadedness with hypotension: YES Has patient had a PCN reaction causing severe rash involving mucus membranes or skin necrosis: YES Has patient had a PCN reaction that required  hospitalization NO Has patient had a PCN reaction occurring within the last 10 years: NO If all of the above answers are "NO", then may proceed with Cephalosporin use.    Outpatient Encounter Medications as of 03/17/2021  Medication Sig   Calcium-Phosphorus-Vitamin D [phone] MG-MG-UNIT CHEW Chew 2 tablets by mouth daily.    Cyanocobalamin (VITAMIN B 12 PO) Take 1 tablet by mouth daily.   diltiazem (CARDIZEM CD) 240 MG 24 hr capsule TAKE 1 CAPSULE EVERY DAY.   dorzolamide-timolol (COSOPT) 22.3-6.8 MG/ML ophthalmic solution Place 1 drop into both eyes 2 (two) times daily.   furosemide (LASIX) 20 MG tablet Take 1 tablet (20 mg total) by mouth every other day.   ipratropium (ATROVENT) 0.06 % nasal spray USE 2 SPRAYS IN EACH NOSTRIL 3 TIMES A DAY AS NEEDED.   irbesartan (AVAPRO) 300 MG tablet Take 1 tablet (300 mg total) by mouth daily.   LACTASE PO Take 1 tablet by mouth as needed (when eating dairy products). Take as directed   loratadine (CLARITIN) 10 MG tablet Take 1 tablet (10 mg total) by mouth daily.   LUMIGAN 0.01 % SOLN Place 1 drop into both eyes 2 (two) times daily.    metoprolol succinate (TOPROL XL) 25 MG 24 hr tablet Take 0.5 tablets (12.5 mg total) by mouth daily.   Multiple Vitamins-Minerals (MULTIVITAMIN WITH MINERALS) tablet Take 1 tablet by mouth daily.   Netarsudil Dimesylate 0.02 % SOLN Apply 1 drop to eye at bedtime. Right eye   Probiotic Product (ALIGN) 4 MG CAPS Take by mouth daily.   rosuvastatin (CRESTOR) 5 MG tablet TAKE 1 TABLET ONCE DAILY FOR CHOLESTEROL.   SYNTHROID 125 MCG tablet TAKE 1 TABLET ONCE DAILY.   UNABLE TO FIND Take by mouth as needed. Med Name: OTC purified peppermint oil for IBS - 3 caps before lunch and dinner   UNABLE TO FIND Med Name:Hemeopathic ear drops  2 drops twice daily   [DISCONTINUED] Vibegron 75 MG TABS Take 1 tablet by mouth daily.   No facility-administered encounter medications on file as of 03/17/2021.    Review of Systems:   Review of Systems  Constitutional:  Positive for activity change.  HENT: Negative.    Respiratory: Negative.    Cardiovascular:  Positive for leg swelling.  Gastrointestinal: Negative.   Genitourinary:  Positive for frequency and urgency.  Musculoskeletal: Negative.   Skin: Negative.   Neurological:  Negative for dizziness.  Psychiatric/Behavioral: Negative.     Health Maintenance  Topic Date Due   PNA vac Low Risk Adult (2 of 2 - PCV13) 08/29/2014   COVID-19 Vaccine (4 - Booster for Moderna series) 10/11/2020   INFLUENZA VACCINE  03/29/2021   TETANUS/TDAP  06/19/2022   DEXA SCAN  Completed   Zoster Vaccines- Shingrix  Completed   HPV VACCINES  Aged Out    Physical Exam: Vitals:   03/17/21 1106  BP: (!) 162/72  Pulse: 79  Temp: 97.9 F (36.6 C)  SpO2: 97%  Weight: 137 lb 3.2 oz (62.2 kg)  Height: _0  (1.6 m)   Body mass index is 24.3 kg/m. Physical Exam Constitutional: Oriented to person, place, and time. Well-developed and well-nourished.  HENT:  Head: Normocephalic.  Mouth/Throat: Oropharynx is clear and moist.  Ears Mild Wax TM normal Eyes: Pupils are equal, round, and reactive to light.  Neck: Neck supple.  Cardiovascular: Normal rate and normal heart sounds.  No murmur heard. Pulmonary/Chest: Effort normal and breath sounds normal. No respiratory distress. No wheezes. She has no rales.  Abdominal: Soft. Bowel sounds are normal. No distension. There is no tenderness. There is no rebound.  Musculoskeletal: Bilateral Edema  Right more then left Right also has Prominent Varicose Veins Lymphadenopathy: none Neurological: Alert and oriented to person, place, and time.  Wears Back Brace Skin: Skin is warm and dry.  Psychiatric: Normal mood and affect. Behavior is normal. Thought content normal.   Labs reviewed: Basic Metabolic Panel: Recent Labs    06/25/20 0000 03/11/21 0000  NA 141 141  K 4.2 4.1  CL 106 105  CO2 25* 23*  BUN 15 12  CREATININE  0.5 0.5  CALCIUM 9.7 10.0  TSH 0.93  --    Liver Function Tests: Recent Labs    06/25/20 0000  AST 21  ALT 19  ALBUMIN 4.3   No results for input(s): LIPASE, AMYLASE in the last 8760 hours. No results for input(s): AMMONIA in the last 8760 hours. CBC: Recent Labs    06/25/20 0000 03/11/21 0000  WBC 6.1 5.5  HGB 13.6 13.0  HCT 41 39  PLT 214 211   Lipid Panel: Recent Labs    06/25/20 0000  CHOL 163  HDL 76*  TRIG 108   Lab Results  Component Value Date   HGBA1C 5.8 03/11/2021    Procedures since last visit: No results found.  Assessment/Plan Essential hypertension On Avapro and Cardizem Will start on Toprol 12.5 mg QD Follow BP Closely Come for follow up Chronic diastolic congestive heart failure (HCC) Need to restart her Lasix Very reluctant to take   Venous insufficiency of both lower extremities Restart Lasix Cut back Salt Does not like ted hoses Elevate Legs Senile osteoporosis On Prolia now 3/22 Failed Evenity Urge incontinence On Vibegron Symptoms better Primary osteoarthritis involving multiple joints Tylenol PRN HLD On Crestor    Labs/tests ordered:  * No order type specified * Next appt:  04/26/2021         /

## 2021-03-17 NOTE — Progress Notes (Signed)
[facility] Return Visit  SUBJECTIVE  History of Present Illness: GUYNELL KLEIBER is a 87 y.o. female seen in follow-up for overactive bladder. Plan at last visit was to start [facility] 75mg  and referral was placed to physical therapy. Has attended 2 PT sessions so far.   Has noticed a great improvement with the Gemtasa. She has noticed some constipation. Not really having leakage unless she waits a long time to go to the bathroom. The medication is expensive but worth it for her.   Past Medical History: Patient  has a past medical history of Allergy, Arthritis, Bilateral carotid bruits (03/2003), Cataract, Chronic anxiety, Constipation, Diverticulosis of sigmoid colon, Emphysema of lung ([place]), GERD (gastroesophageal reflux disease), Glaucoma, H/O adenomatous polyp of colon, High cholesterol, HTN (hypertension) (07/17/2015), Hypothyroidism, Insomnia, Lactose intolerance, Lumbar degenerative disc disease, Myoview, Neuromuscular disorder ([place]), Onychomycosis, Osteoarthritis, Osteoporosis, Second degree Mobitz II AV block, Spinal stenosis, Thyroid disease, and Varicose veins.   Past Surgical History: She  has a past surgical history that includes Cholecystectomy; Joint replacement; Abdominal hysterectomy; Shoulder arthroscopy with rotator cuff repair (Right, 08/2003); Bladder repair; Laminectomy (02/2011); Incontinence surgery; Total hip arthroplasty; Colonoscopy (05/18/2012); Cataract extraction w/ intraocular lens  implant, bilateral (7 & 8 2014); blephoplasty bilateral (03/10/2014); Lumbar laminectomy/decompression microdiscectomy (Right, 09/21/2015); Tonsillectomy; herniated disc repair; Cardiac catheterization (N/A, 08/16/2016); and Eye surgery (Right, 05/02/2018).   Medications: She has a current medication list which includes the following prescription(s): calcium-phosphorus-vitamin d, cyanocobalamin, diltiazem, dorzolamide-timolol, furosemide, ipratropium, irbesartan, lactase, loratadine,  lumigan, metoprolol succinate, multivitamin with minerals, netarsudil dimesylate, align, rosuvastatin, synthroid, UNABLE TO FIND, UNABLE TO FIND, and vibegron.   Allergies: Patient is allergic to codeine, macrobid [nitrofurantoin], miralax [polyethylene glycol], other, pain relief [acetaminophen], paxil [paroxetine hcl], percodan [oxycodone-aspirin], oxycodone, oxycodone-acetaminophen, and penicillins.   Social History: Patient  reports that she has never smoked. She has never used smokeless tobacco. She reports that she does not drink alcohol and does not use drugs.      OBJECTIVE     Physical Exam: Vitals:   03/18/21 1347  BP: (!) 156/74  Pulse: (!) 101   Gen: No apparent distress, A&O x 3.  Detailed Urogynecologic Evaluation:  Deferred.     ASSESSMENT AND PLAN    Ms. Gilliam is a 87 y.o. with:  1. SUI (stress urinary incontinence, female)   2. Overactive bladder    - has noticed significant improvement with her symptoms. Continue Gemtasa 75mg , refill provided.  - Since the medication is expensive, and she is not a candidate for alternatives such as myrbetriq due to elevated BP, we also discussed tibial nerve stimulation. She will consider this and call and let us know if she wants to pursue it. She does have some ankle swelling which may preclude needle placement, but did not take her lasix today.   Return 6 months or sooner if needed.   Jaquita Folds, MD  Time spent: I spent 20 minutes dedicated to the care of this patient on the date of this encounter to include pre-visit review of records, face-to-face time with the patient and post visit documentation and ordering medication/ testing.

## 2021-03-18 ENCOUNTER — Other Ambulatory Visit: Payer: Self-pay

## 2021-03-18 ENCOUNTER — Ambulatory Visit (INDEPENDENT_AMBULATORY_CARE_PROVIDER_SITE_OTHER): Payer: Medicare Other | Admitting: Obstetrics and Gynecology

## 2021-03-18 ENCOUNTER — Encounter: Payer: Self-pay | Admitting: Obstetrics and Gynecology

## 2021-03-18 DIAGNOSIS — N393 Stress incontinence (female) (male): Secondary | ICD-10-CM

## 2021-03-18 DIAGNOSIS — N3281 Overactive bladder: Secondary | ICD-10-CM

## 2021-03-18 MED ORDER — VIBEGRON 75 MG PO TABS: 1.0000 | ORAL_TABLET | Freq: Every day | ORAL | 5 refills | Status: DC

## 2021-03-18 NOTE — Patient Instructions (Signed)
Continue Gemtasa 75mg  daily.

## 2021-03-22 ENCOUNTER — Ambulatory Visit: Payer: Medicare Other | Admitting: Physician Assistant

## 2021-03-22 NOTE — Progress Notes (Signed)
Electrophysiology Office Note Date: 03/23/2021  ID:  Destiny [name], DOB 10/08/1927, MRN [id number]  PCP: Virgie Dad, MD Primary Cardiologist: None Electrophysiologist: Thompson Grayer, MD   CC: Pacemaker follow-up  Destiny [name] is a 87 y.o. female seen today for Thompson Grayer, MD for routine electrophysiology followup.  Since last being seen in our clinic the patient reports doing overall well. PCP added metoprolol last week. Systolic BP at home running 140-170s. She denies chest pain, palpitations, dyspnea, PND, orthopnea, nausea, vomiting, dizziness, syncope, edema, weight gain, or early satiety.  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 Cristina Gong   High cholesterol    HTN (hypertension) 07/17/2015   Echo 08/2019: EF 50-09, grade 1 diastolic dysfunction, mild apical HK, normal RV SF, trivial pericardial effusion, moderate MAC, mild MR, mild TR   Hypothyroidism    Insomnia    Lactose intolerance    Lumbar degenerative disc disease    Myoview    Myoview 06/2019: EF 48, normal perfusion; low risk (EF normal by echo 10/2018)   Neuromuscular disorder (HCC)    Pronated ankles /Feet    Onychomycosis    Osteoarthritis    Osteoporosis    Second degree Mobitz II AV block    s/p PPM   Spinal stenosis    Thyroid disease    Varicose veins    Past Surgical History:  Procedure Laterality Date   ABDOMINAL HYSTERECTOMY     BLADDER REPAIR     blephoplasty bilateral  03/10/2014   CATARACT EXTRACTION W/ INTRAOCULAR LENS  IMPLANT, BILATERAL  7 & 8 2014    CHOLECYSTECTOMY     COLONOSCOPY  05/18/2012   EP IMPLANTABLE DEVICE N/A 08/16/2016   MDT Advisa MRI conditional PPM implanted by Dr Rayann Heman for mobitz II second degree AV block   EYE SURGERY Right 05/02/2018   laser eye surgery   herniated disc repair     INCONTINENCE SURGERY     JOINT REPLACEMENT     LAMINECTOMY  02/2011   L3-L5  Dr. Ellene Route   LUMBAR LAMINECTOMY/DECOMPRESSION MICRODISCECTOMY Right 09/21/2015   Procedure: Right Lumbar Three-FourMicrodiskectomy;  Surgeon: Kristeen Miss, MD;  Location: [facility];  Service: Neurosurgery;  Laterality: Right;  Right L3-4 Microdiskectomy   SHOULDER ARTHROSCOPY WITH ROTATOR CUFF REPAIR Right 08/2003   TONSILLECTOMY     TOTAL HIP ARTHROPLASTY     right 1999    Current Outpatient Medications  Medication Sig Dispense Refill   Calcium-Phosphorus-Vitamin D [phone] MG-MG-UNIT CHEW Chew 2 tablets by mouth daily.      Cyanocobalamin (VITAMIN B 12 PO) Take 1 tablet by mouth daily.     diltiazem (CARDIZEM CD) 240 MG 24 hr capsule TAKE 1 CAPSULE EVERY DAY. 90 capsule 1   dorzolamide-timolol (COSOPT) 22.3-6.8 MG/ML ophthalmic solution Place 1 drop into both eyes 2 (two) times daily.  5   furosemide (LASIX) 20 MG tablet Take 1 tablet (20 mg total) by mouth every other day. 15 tablet  1   ipratropium (ATROVENT) 0.06 % nasal spray USE 2 SPRAYS IN EACH NOSTRIL 3 TIMES A DAY AS NEEDED. 15 mL 3   irbesartan (AVAPRO) 300 MG tablet Take 1 tablet (300 mg total) by mouth daily. 90 tablet 0   LACTASE PO Take 1 tablet by mouth as needed (when eating dairy products). Take as directed     loratadine (CLARITIN) 10 MG tablet Take 1 tablet (10 mg total) by mouth daily. 30 tablet 11   LUMIGAN 0.01 % SOLN Place 1 drop into both eyes 2 (two) times daily.   98   metoprolol succinate (TOPROL XL) 25 MG 24 hr tablet Take 0.5 tablets (12.5 mg total) by mouth daily. 30 tablet 2   Multiple Vitamins-Minerals (MULTIVITAMIN WITH MINERALS) tablet Take 1 tablet by mouth daily.      Netarsudil Dimesylate 0.02 % SOLN Apply 1 drop to eye at bedtime. Right eye     Probiotic Product (ALIGN) 4 MG CAPS Take by mouth daily.     rosuvastatin (CRESTOR) 5 MG tablet TAKE 1 TABLET ONCE DAILY FOR CHOLESTEROL. 90 tablet 1   SYNTHROID 125 MCG tablet TAKE 1 TABLET ONCE DAILY. 90 tablet 1   UNABLE TO FIND Take by mouth as needed. Med Name: OTC purified peppermint oil for IBS - 3 caps before lunch and dinner     UNABLE TO FIND Med Name:Hemeopathic ear drops  2 drops twice daily     Vibegron 75 MG TABS Take 1 tablet by mouth daily. 30 tablet 5   No current facility-administered medications for this visit.    Allergies:   Codeine, Macrobid [nitrofurantoin], Miralax [polyethylene glycol], Other, Pain relief [acetaminophen], Paxil [paroxetine hcl], Percodan [oxycodone-aspirin], Oxycodone, Oxycodone-acetaminophen, and Penicillins   Social History: Social History   Socioeconomic History   Marital status: Widowed    Spouse name: Not on file   Number of children: Not on file   Years of education: Not on file   Highest education level: Not on file  Occupational History   Not on file  Tobacco Use   Smoking status: Never   Smokeless tobacco: Never  Vaping Use   Vaping Use: Never used  Substance and Sexual Activity   Alcohol use: No   Drug use: No   Sexual activity: Not Currently  Other Topics Concern   Not on file  Social History Narrative   Lives at [facility]   Caffeine 1-2 cups daily   Exercise water aerobics, walking   Never smoked   POA               Do you drink/ eat things with caffeine? Yes      Marital status:    Widow                           What year were you married ? 1957      Do you live in a house, apartment,assistred living, condo, trailer, etc.)?[facility]      Is it one or more stories?       How many persons live in your home ? 1      Do you have any pets in your home ?(please list)0       Current or past profession:  Social Worker      Do you exercise?  Yes  Type & how often:  Water Aerobic, 3-4 hours per week       Do you have a living will? Yes      Do you have a DNR form? Yes                       If not, do you want to discuss one?       Do you have signed POA?HPOA forms?                 If so, please bring to your        appointment         Social Determinants of Health   Financial Resource Strain: Not on file  Food Insecurity: Not on file  Transportation Needs: Not on file  Physical Activity: Not on file  Stress: Not on file  Social Connections: Not on file  Intimate Partner Violence: Not on file    Family History: Family History  Problem Relation Age of Onset   Arthritis Mother    Osteoporosis Mother    Heart attack Father      Review of Systems: All other systems reviewed and are otherwise negative except as noted above.  Physical Exam: Vitals:   03/23/21 1153  BP: (!) 148/62  Pulse: 74  SpO2: 95%  Weight: 135 lb 6.4 oz (61.4 kg)  Height: _0  (1.6 m)     GEN- The patient is well appearing, alert and oriented x 3 today.   HEENT: normocephalic, atraumatic; sclera clear, conjunctiva pink; hearing intact; oropharynx clear; neck supple  Lungs- Clear to ausculation bilaterally, normal work of breathing.  No wheezes, rales, rhonchi Heart- Regular rate and rhythm, no murmurs, rubs or gallops  GI- soft, non-tender, non-distended, bowel sounds present  Extremities- no clubbing or cyanosis. No edema MS- no significant deformity or atrophy Skin- warm and dry, no rash or lesion; PPM pocket well healed Psych- euthymic mood, full affect Neuro- strength and sensation are intact  PPM Interrogation- reviewed in detail today,  See PACEART report  EKG:  EKG is not ordered today.  Recent Labs: 06/25/2020: ALT 19; TSH 0.93 03/11/2021: BUN 12; Creatinine 0.5; Hemoglobin 13.0; Platelets 211; Potassium 4.1; Sodium 141   Wt Readings from Last 3  Encounters:  03/23/21 135 lb 6.4 oz (61.4 kg)  03/17/21 137 lb 3.2 oz (62.2 kg)  12/25/20 141 lb (64 kg)     Other studies Reviewed: Additional studies/ records that were reviewed today include: Previous EP office notes, Previous remote checks, Most recent labwork.   Assessment and Plan:  1. Symptomatic bradycardia due to mobitz ii second degree HB s/p Medtronic PPM  Normal PPM function See Pace Art report Pulse width adjusted to maintain 2x safety margin in RV.   2. HTN Slightly elevated today. Metoprolol added last week by PCP. She will continue to follow BP at home and call PCP to increase if systolic remains > 188 at home.  3. HL Continue on crestor   Current medicines are reviewed at length with the patient today.   The patient does not have concerns regarding her medicines.  The following changes were made today:  none  Labs/ tests ordered today include:  No orders of the defined types were placed in this encounter.   Disposition:   Follow up with Dr. Rayann Heman or EP app in 12 Months   Signed, Annamaria Helling  03/23/2021 12:02 PM  [facility] [address] [place] [address] [phone] (office) [phone] (fax)

## 2021-03-23 ENCOUNTER — Ambulatory Visit (INDEPENDENT_AMBULATORY_CARE_PROVIDER_SITE_OTHER): Payer: Medicare Other | Admitting: Student

## 2021-03-23 ENCOUNTER — Other Ambulatory Visit: Payer: Self-pay

## 2021-03-23 ENCOUNTER — Encounter: Payer: Self-pay | Admitting: Student

## 2021-03-23 VITALS — BP 148/62 | HR 74 | Ht 63.0 in | Wt 135.4 lb

## 2021-03-23 DIAGNOSIS — I441 Atrioventricular block, second degree: Secondary | ICD-10-CM

## 2021-03-23 DIAGNOSIS — I10 Essential (primary) hypertension: Secondary | ICD-10-CM | POA: Diagnosis not present

## 2021-03-23 DIAGNOSIS — E7849 Other hyperlipidemia: Secondary | ICD-10-CM

## 2021-03-23 LAB — CUP PACEART INCLINIC DEVICE CHECK
Battery Remaining Longevity: 75 mo
Battery Voltage: 3.01 V
Brady Statistic AP VP Percent: 10.26 %
Brady Statistic AP VS Percent: 1.93 %
Brady Statistic AS VP Percent: 74.9 %
Brady Statistic AS VS Percent: 12.92 %
Brady Statistic RA Percent Paced: 12.13 %
Brady Statistic RV Percent Paced: 84.91 %
Date Time Interrogation Session: 20220726125056
Implantable Lead Implant Date: 20171219
Implantable Lead Implant Date: 20171219
Implantable Lead Location: 753859
Implantable Lead Location: 753860
Implantable Lead Model: 5076
Implantable Lead Model: 5076
Implantable Pulse Generator Implant Date: 20171219
Lead Channel Impedance Value: 304 Ohm
Lead Channel Impedance Value: 342 Ohm
Lead Channel Impedance Value: 893 Ohm
Lead Channel Impedance Value: 969 Ohm
Lead Channel Pacing Threshold Amplitude: 0.625 V
Lead Channel Pacing Threshold Amplitude: 1.875 V
Lead Channel Pacing Threshold Pulse Width: 0.4 ms
Lead Channel Pacing Threshold Pulse Width: 0.4 ms
Lead Channel Sensing Intrinsic Amplitude: 2.875 mV
Lead Channel Sensing Intrinsic Amplitude: 3.375 mV
Lead Channel Sensing Intrinsic Amplitude: 7.75 mV
Lead Channel Sensing Intrinsic Amplitude: 7.875 mV
Lead Channel Setting Pacing Amplitude: 2 V
Lead Channel Setting Pacing Amplitude: 3.75 V
Lead Channel Setting Pacing Pulse Width: 0.6 ms
Lead Channel Setting Sensing Sensitivity: 2.8 mV

## 2021-03-23 NOTE — Patient Instructions (Signed)
Medication Instructions:  Your physician recommends that you continue on your current medications as directed. Please refer to the Current Medication list given to you today.  *If you need a refill on your cardiac medications before your next appointment, please call your pharmacy*   Lab Work: None If you have labs (blood work) drawn today and your tests are completely normal, you will receive your results only by: [place] (if you have MyChart) OR A paper copy in the mail If you have any lab test that is abnormal or we need to change your treatment, we will call you to review the results.  Follow-Up: At [facility], you and your health needs are our priority.  As part of our continuing mission to provide you with exceptional heart care, we have created designated Provider Care Teams.  These Care Teams include your primary Cardiologist (physician) and Advanced Practice Providers (APPs -  Physician Assistants and Nurse Practitioners) who all work together to provide you with the care you need, when you need it.  We recommend signing up for the patient portal called "MyChart".  Sign up information is provided on this After Visit Summary.  MyChart is used to connect with patients for Virtual Visits (Telemedicine).  Patients are able to view lab/test results, encounter notes, upcoming appointments, etc.  Non-urgent messages can be sent to your provider as well.   To learn more about what you can do with MyChart, go to [email or web address].    Your next appointment:   1 year(s)  The format for your next appointment:   In Person  Provider:   You may see Thompson Grayer, MD or one of the following Advanced Practice Providers on your designated Care Team:    Legrand Como "Jonni Sanger" [place], [place]

## 2021-03-24 ENCOUNTER — Encounter: Payer: Self-pay | Admitting: Physical Therapy

## 2021-03-24 ENCOUNTER — Ambulatory Visit: Payer: Medicare Other | Admitting: Physical Therapy

## 2021-03-24 DIAGNOSIS — M6281 Muscle weakness (generalized): Secondary | ICD-10-CM | POA: Diagnosis not present

## 2021-03-24 DIAGNOSIS — R293 Abnormal posture: Secondary | ICD-10-CM | POA: Diagnosis not present

## 2021-03-24 NOTE — Therapy (Signed)
[facility] [address], [place], [place], [address] Phone: [phone]   Fax:  [phone]  Physical Therapy Treatment  Patient Details  Name: Destiny [name] MRN: [id number] Date of Birth: 1927/09/16 Referring Provider (PT): Jaquita Folds, MD   Encounter Date: 03/24/2021   PT End of Session - 03/24/21 1153     Visit Number 4    Date for PT Re-Evaluation 05/26/21    Authorization Type medicare    Progress Note Due on Visit 10    PT Start Time 1150    PT Stop Time 1230    PT Time Calculation (min) 40 min    Activity Tolerance Patient tolerated treatment well    Behavior During Therapy [facility] for tasks assessed/performed             Past Medical History:  Diagnosis Date   Allergy    Arthritis    Bilateral carotid bruits 03/2003   [place] 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 Cristina Gong   High cholesterol    HTN (hypertension) 07/17/2015   Echo 08/2019: EF 45-03, grade 1 diastolic dysfunction, mild apical HK, normal RV SF, trivial pericardial effusion, moderate MAC, mild MR, mild TR   Hypothyroidism    Insomnia    Lactose intolerance    Lumbar degenerative disc disease    Myoview    Myoview 06/2019: EF 48, normal perfusion; low risk (EF normal by echo 10/2018)   Neuromuscular disorder (HCC)    Pronated ankles /Feet    Onychomycosis    Osteoarthritis    Osteoporosis    Second degree Mobitz II AV block    s/p PPM   Spinal stenosis    Thyroid disease    Varicose veins     Past Surgical History:  Procedure Laterality Date   ABDOMINAL HYSTERECTOMY     BLADDER REPAIR     blephoplasty bilateral  03/10/2014   CATARACT EXTRACTION W/ INTRAOCULAR LENS  IMPLANT, BILATERAL  7 & 8 2014    CHOLECYSTECTOMY     COLONOSCOPY  05/18/2012   EP IMPLANTABLE DEVICE N/A 08/16/2016   MDT Advisa MRI conditional PPM implanted by Dr Rayann Heman for mobitz II second degree AV block   EYE SURGERY Right 05/02/2018   laser eye surgery   herniated disc repair     INCONTINENCE SURGERY     JOINT REPLACEMENT     LAMINECTOMY  02/2011   L3-L5  Dr. Ellene Route   LUMBAR LAMINECTOMY/DECOMPRESSION MICRODISCECTOMY Right 09/21/2015   Procedure: Right Lumbar Three-FourMicrodiskectomy;  Surgeon: Kristeen Miss, MD;  Location: [facility];  Service: Neurosurgery;  Laterality: Right;  Right L3-4 Microdiskectomy   SHOULDER ARTHROSCOPY WITH ROTATOR CUFF REPAIR Right 08/2003   TONSILLECTOMY     TOTAL HIP ARTHROPLASTY     right 1999    There were no vitals filed for this visit.   Subjective Assessment - 03/24/21 1152     Subjective No new complaints today. My back is a little sore.    Pertinent History abdominal hysterectomy, bladder repair, lumbar DDD, compression fracture    Currently in Pain? Yes    Pain Score 2     Pain Location Back  Pain Orientation Lower    Pain Descriptors / Indicators Dull;Sore    Aggravating Factors  Al ot of waking    Pain Relieving Factors Rest    Multiple Pain Sites No           Treatment: Patient seen for aquatic therapy today.  Treatment took place in water 2.5-4 feet deep depending upon activity.  Pt entered the pool via steps, step to step, water temp 92 degrees.   Seated water bench with 75% submersion Pt performed seated LE AROM exercises 20x in all planes, pain assessment concurrent.  Standing in mid waist depth for the following: Water walking forward and sideways 4x each holding onto the noodle. Small steps.  Wall exercises lightly holding onto pool wall; 10x hip abduction, marching, calf raises, mini squats. Rest her back on the wall x 1 min Staying with back support on the wall (REQUESTED) multicolored UE weights for horizontal abd/add 15x, then UE only shoulder  flexion and extension 10x.  Gastroc stretch Bil 2x10 sec                            PT Education - 03/24/21 1427     Education Details Pool HEP: printed for PT to give to pt at her next land appt.    Person(s) Educated Patient    Methods Explanation;Demonstration    Comprehension Verbalized understanding;Returned demonstration              PT Short Term Goals - 03/03/21 1604       PT SHORT TERM GOAL #1   Title ind with initial HEP    Time 4    Period Weeks    Status New    Target Date 03/31/21      PT SHORT TERM GOAL #2   Title Pt will demonstrate 5/5 hip strength for impoved gait    Time 4    Period Weeks    Status New    Target Date 03/31/21               PT Long Term Goals - 03/03/21 1414       PT LONG TERM GOAL #1   Title pt will notice she is having less shortness of breath when walking around the community    Baseline SOB walking from front office to back room    Time 12    Period Weeks    Status New    Target Date 05/26/21      PT LONG TERM GOAL #2   Title Being able to get to the bathroom without leakage even after sitting for 2 hours    Time 12    Period Weeks    Status New    Target Date 05/26/21      PT LONG TERM GOAL #3   Title Pt will be able to demonstrate improved upright posture due to improved core and back strength and mobility    Time 12    Period Weeks    Status New    Target Date 05/26/21      PT LONG TERM GOAL #4   Title Pt will be ind with HEP and able to return to aquatics classes that she was previously attending    Time 12    Period Weeks    Status New    Target Date 05/26/21  Plan - 03/24/21 1421     Clinical Impression Statement Pt arrives with less fatigue than last aquatic session. Pt tolerated walking from car all the way to the pool deck today using her RW. Pt requests visuals to help with her transition to the pool at [facility].Pt demonstrates fatigue  concurrent with her age, no increase in her back pain.    Personal Factors and Comorbidities Comorbidity 3+    Comorbidities abdominal hysterectomy, bladder repair, lumbar DDD    Examination-Activity Limitations Toileting;Continence;Sit    Examination-Participation Restrictions Community Activity    Stability/Clinical Decision Making Evolving/Moderate complexity    Rehab Potential Excellent    PT Frequency 2x / week    PT Duration 12 weeks    PT Treatment/Interventions ADLs/Self Care Home Management;Aquatic Therapy;Biofeedback;Cryotherapy;Electrical Stimulation;Moist Heat;Neuromuscular re-education;Therapeutic exercise;Therapeutic activities;Gait training;Manual techniques;Taping;Patient/family education    PT Next Visit Plan continue to strengthen for improved posutre and reduced risk of stress fractures in spine and core/pelvic floor strength for bladder control; hip strength and pre-gait activities: please give pt the copies of her aquatic exercises.    PT Home Exercise Plan Access Code: [email or web address]    Consulted and Agree with Plan of Care Patient             Patient will benefit from skilled therapeutic intervention in order to improve the following deficits and impairments:  Abnormal gait, Postural dysfunction, Decreased strength, Impaired flexibility  Visit Diagnosis: Abnormal posture  Muscle weakness (generalized)     Problem List Patient Active Problem List   Diagnosis Date Noted   Hyperglycemia 03/18/2020   Flat foot 03/18/2020   Kyphoscoliosis 10/30/2019   Closed compression fracture of L3 lumbar vertebra, initial encounter ([place]) 10/30/2019   Venous insufficiency of both lower extremities 10/30/2019   Balance problem 10/30/2019   Aortic atherosclerosis ([place]) 11/07/2018   Tongue lesion 05/24/2017   S/P placement of cardiac pacemaker 01/11/2017   Second degree Mobitz II AV block 08/16/2016   General weakness 08/15/2016   Bradycardia 08/15/2016   Gastroesophageal  reflux disease without esophagitis 02/03/2016   Hiatal hernia 02/03/2016   Allergic sinusitis 02/03/2016   Senile osteoporosis 02/03/2016   DDD (degenerative disc disease), lumbar 02/03/2016   Hypothyroidism due to acquired atrophy of thyroid 02/03/2016   Frequent loose stools 02/03/2016   Basal cell carcinoma of lower back 02/03/2016   Lactose intolerance in adult 02/03/2016   Glaucoma 02/03/2016   Herniated nucleus pulposus, L3-4 right 09/21/2015   Piriformis syndrome 07/17/2015   Constipation 07/17/2015   HLD (hyperlipidemia) 07/17/2015   Anxiety state 07/17/2015   HTN (hypertension) 07/17/2015    Malli Falotico, PTA 03/24/2021, 2:30 PM   [facility] [address], [place], [place], [address] Phone: [phone]   Fax:  [phone]  Name: SOPHIA SPERRY MRN: [id number] Date of Birth: 27-May-1928

## 2021-03-26 ENCOUNTER — Other Ambulatory Visit: Payer: Self-pay

## 2021-03-26 ENCOUNTER — Ambulatory Visit: Payer: Medicare Other | Admitting: Physical Therapy

## 2021-03-26 DIAGNOSIS — M6281 Muscle weakness (generalized): Secondary | ICD-10-CM | POA: Diagnosis not present

## 2021-03-26 DIAGNOSIS — R293 Abnormal posture: Secondary | ICD-10-CM

## 2021-03-26 NOTE — Therapy (Signed)
[facility] [address], [place], [place], [address] Phone: [phone]   Fax:  [phone]  Physical Therapy Treatment  Patient Details  Name: Destiny [name] MRN: [id number] Date of Birth: Apr 02, 1928 Referring Provider (PT): Jaquita Folds, MD   Encounter Date: 03/26/2021   PT End of Session - 03/26/21 1155     Visit Number 5    Date for PT Re-Evaluation 05/26/21    Authorization Type medicare    PT Start Time 1100    PT Stop Time 1139    PT Time Calculation (min) 39 min    Activity Tolerance Patient tolerated treatment well    Behavior During Therapy [facility] for tasks assessed/performed             Past Medical History:  Diagnosis Date   Allergy    Arthritis    Bilateral carotid bruits 03/2003   [place] 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 Cristina Gong   High cholesterol    HTN (hypertension) 07/17/2015   Echo 08/2019: EF 73-71, grade 1 diastolic dysfunction, mild apical HK, normal RV SF, trivial pericardial effusion, moderate MAC, mild MR, mild TR   Hypothyroidism    Insomnia    Lactose intolerance    Lumbar degenerative disc disease    Myoview    Myoview 06/2019: EF 48, normal perfusion; low risk (EF normal by echo 10/2018)   Neuromuscular disorder (HCC)    Pronated ankles /Feet    Onychomycosis    Osteoarthritis    Osteoporosis    Second degree Mobitz II AV block    s/p PPM   Spinal stenosis    Thyroid disease    Varicose veins     Past Surgical History:  Procedure Laterality Date   ABDOMINAL HYSTERECTOMY     BLADDER REPAIR     blephoplasty bilateral  03/10/2014   CATARACT EXTRACTION W/ INTRAOCULAR LENS  IMPLANT, BILATERAL  7 & 8 2014   CHOLECYSTECTOMY     COLONOSCOPY   05/18/2012   EP IMPLANTABLE DEVICE N/A 08/16/2016   MDT Advisa MRI conditional PPM implanted by Dr Rayann Heman for mobitz II second degree AV block   EYE SURGERY Right 05/02/2018   laser eye surgery   herniated disc repair     INCONTINENCE SURGERY     JOINT REPLACEMENT     LAMINECTOMY  02/2011   L3-L5  Dr. Ellene Route   LUMBAR LAMINECTOMY/DECOMPRESSION MICRODISCECTOMY Right 09/21/2015   Procedure: Right Lumbar Three-FourMicrodiskectomy;  Surgeon: Kristeen Miss, MD;  Location: [facility];  Service: Neurosurgery;  Laterality: Right;  Right L3-4 Microdiskectomy   SHOULDER ARTHROSCOPY WITH ROTATOR CUFF REPAIR Right 08/2003   TONSILLECTOMY     TOTAL HIP ARTHROPLASTY     right 1999    There were no vitals filed for this visit.   Subjective Assessment - 03/26/21 1157     Subjective I can wait 2 hours before going to the bathroom and no leakage.  If I wait a really long time like 3 hours I might have a little leakage.    Pertinent History abdominal hysterectomy, bladder repair, lumbar DDD, compression fracture    Patient Stated Goals if she can go longer than  going to the bathroom    Currently in Pain? No/denies                               [facility] Adult PT Treatment/Exercise - 03/26/21 0001       Ambulation/Gait   Gait Comments walker - around gym x3; cues to stand up tall , stopped due to fatigue and SOB      Self-Care   Self-Care Other Self-Care Comments    Other Self-Care Comments  using brace donning and doffing      Lumbar Exercises: Standing   Row 20 reps;Strengthening    Theraband Level (Row) Level 2 (Red)    Shoulder Extension Strengthening;20 reps;Theraband    Theraband Level (Shoulder Extension) Level 2 (Red)      Lumbar Exercises: Seated   Long Arc Quad on Chair Strengthening;Both;20 reps    LAQ on Chair Limitations ball squeeze    Other Seated Lumbar Exercises clam green loop; march, ball squeezes - 10x each                      PT Short Term  Goals - 03/26/21 1133       PT SHORT TERM GOAL #1   Title ind with initial HEP    Status Achieved      PT SHORT TERM GOAL #2   Title Pt will demonstrate 5/5 hip strength for impoved gait    Status On-going               PT Long Term Goals - 03/26/21 1134       PT LONG TERM GOAL #1   Title pt will notice she is having less shortness of breath when walking around the community    Baseline SOB walking from front office to back room initially; today was not as bad    Status On-going      PT LONG TERM GOAL #2   Title Being able to get to the bathroom without leakage even after sitting for 2 hours    Baseline getting to the bathroom unless I wait 3 hours    Status Achieved      PT LONG TERM GOAL #3   Title Pt will be able to demonstrate improved upright posture due to improved core and back strength and mobility    Baseline got brace and feels good with the new posture support    Status On-going      PT LONG TERM GOAL #4   Title Pt will be ind with HEP and able to return to aquatics classes that she was previously attending    Status On-going                   Plan - 03/26/21 1143     Clinical Impression Statement Pt has met her goal for reduction of leakage and can get to the bathroom unless she waits 3 hours and then she might have a little leakage.  Pt was able to walk 3 laps in the gym today with several cues to stand up tall.  The brace was helping in general and she was self correcting most of the time when walking and standing.  Pt continues to get SOB with walking and uses UE support anytime she is not holding onto her walker.  Pt will benefit from skilled PT to continue to work towards remaining functional goals    PT  Treatment/Interventions ADLs/Self Care Home Management;Aquatic Therapy;Biofeedback;Cryotherapy;Electrical Stimulation;Moist Heat;Neuromuscular re-education;Therapeutic exercise;Therapeutic activities;Gait training;Manual  techniques;Taping;Patient/family education    PT Next Visit Plan working on hip strength - re-assess MMT next visit; posture strength in standing as tolerated; walking and standing hip series    PT Home Exercise Plan Access Code: [email or web address]    Consulted and Agree with Plan of Care Patient             Patient will benefit from skilled therapeutic intervention in order to improve the following deficits and impairments:  Abnormal gait, Postural dysfunction, Decreased strength, Impaired flexibility  Visit Diagnosis: Abnormal posture  Muscle weakness (generalized)     Problem List Patient Active Problem List   Diagnosis Date Noted   Hyperglycemia 03/18/2020   Flat foot 03/18/2020   Kyphoscoliosis 10/30/2019   Closed compression fracture of L3 lumbar vertebra, initial encounter ([place]) 10/30/2019   Venous insufficiency of both lower extremities 10/30/2019   Balance problem 10/30/2019   Aortic atherosclerosis ([place]) 11/07/2018   Tongue lesion 05/24/2017   S/P placement of cardiac pacemaker 01/11/2017   Second degree Mobitz II AV block 08/16/2016   General weakness 08/15/2016   Bradycardia 08/15/2016   Gastroesophageal reflux disease without esophagitis 02/03/2016   Hiatal hernia 02/03/2016   Allergic sinusitis 02/03/2016   Senile osteoporosis 02/03/2016   DDD (degenerative disc disease), lumbar 02/03/2016   Hypothyroidism due to acquired atrophy of thyroid 02/03/2016   Frequent loose stools 02/03/2016   Basal cell carcinoma of lower back 02/03/2016   Lactose intolerance in adult 02/03/2016   Glaucoma 02/03/2016   Herniated nucleus pulposus, L3-4 right 09/21/2015   Piriformis syndrome 07/17/2015   Constipation 07/17/2015   HLD (hyperlipidemia) 07/17/2015   Anxiety state 07/17/2015   HTN (hypertension) 07/17/2015    Camillo Flaming Alyssia Heese, PT 03/26/2021, 11:58 AM  [facility] [address] [place], [place],  [address] Phone: [phone]   Fax:  [phone]  Name: SAFIRE GORDIN MRN: [id number] Date of Birth: [birth year]

## 2021-03-29 DIAGNOSIS — H401132 Primary open-angle glaucoma, bilateral, moderate stage: Secondary | ICD-10-CM | POA: Diagnosis not present

## 2021-03-31 ENCOUNTER — Ambulatory Visit (HOSPITAL_BASED_OUTPATIENT_CLINIC_OR_DEPARTMENT_OTHER): Payer: Medicare Other | Admitting: Physical Therapy

## 2021-04-01 ENCOUNTER — Ambulatory Visit: Payer: Medicare Other | Admitting: Physical Therapy

## 2021-04-07 ENCOUNTER — Ambulatory Visit (INDEPENDENT_AMBULATORY_CARE_PROVIDER_SITE_OTHER): Payer: Medicare Other

## 2021-04-07 DIAGNOSIS — I441 Atrioventricular block, second degree: Secondary | ICD-10-CM

## 2021-04-07 LAB — CUP PACEART REMOTE DEVICE CHECK
Battery Remaining Longevity: 71 mo
Battery Voltage: 3 V
Brady Statistic AP VP Percent: 5.03 %
Brady Statistic AP VS Percent: 6.07 %
Brady Statistic AS VP Percent: 48.06 %
Brady Statistic AS VS Percent: 40.84 %
Brady Statistic RA Percent Paced: 11.08 %
Brady Statistic RV Percent Paced: 53.02 %
Date Time Interrogation Session: 20220810094741
Implantable Lead Implant Date: 20171219
Implantable Lead Implant Date: 20171219
Implantable Lead Location: 753859
Implantable Lead Location: 753860
Implantable Lead Model: 5076
Implantable Lead Model: 5076
Implantable Pulse Generator Implant Date: 20171219
Lead Channel Impedance Value: 285 Ohm
Lead Channel Impedance Value: 323 Ohm
Lead Channel Impedance Value: 874 Ohm
Lead Channel Impedance Value: 950 Ohm
Lead Channel Pacing Threshold Amplitude: 0.625 V
Lead Channel Pacing Threshold Amplitude: 1.875 V
Lead Channel Pacing Threshold Pulse Width: 0.4 ms
Lead Channel Pacing Threshold Pulse Width: 0.4 ms
Lead Channel Sensing Intrinsic Amplitude: 3 mV
Lead Channel Sensing Intrinsic Amplitude: 3 mV
Lead Channel Sensing Intrinsic Amplitude: 6.75 mV
Lead Channel Sensing Intrinsic Amplitude: 6.75 mV
Lead Channel Setting Pacing Amplitude: 2 V
Lead Channel Setting Pacing Amplitude: 3.75 V
Lead Channel Setting Pacing Pulse Width: 0.4 ms
Lead Channel Setting Sensing Sensitivity: 2.8 mV

## 2021-04-08 ENCOUNTER — Telehealth: Payer: Self-pay

## 2021-04-08 NOTE — Telephone Encounter (Signed)
[facility] scheduled remote received and noted RV impedance and threshold have risen. Although it has risen, trend now appears stable. V Pacing 54%. Any recommendations?

## 2021-04-09 ENCOUNTER — Ambulatory Visit: Payer: Medicare Other | Attending: Obstetrics and Gynecology | Admitting: Physical Therapy

## 2021-04-09 ENCOUNTER — Encounter: Payer: Self-pay | Admitting: Physical Therapy

## 2021-04-09 ENCOUNTER — Other Ambulatory Visit: Payer: Self-pay

## 2021-04-09 DIAGNOSIS — R293 Abnormal posture: Secondary | ICD-10-CM | POA: Insufficient documentation

## 2021-04-09 DIAGNOSIS — M6281 Muscle weakness (generalized): Secondary | ICD-10-CM | POA: Diagnosis present

## 2021-04-09 NOTE — Therapy (Signed)
[facility] [address], [place], [place], [address] Phone: [phone]   Fax:  [phone]  Physical Therapy Treatment  Patient Details  Name: Destiny [name] MRN: [id number] Date of Birth: Feb 17, 1928 Referring Provider (PT): Jaquita Folds, MD   Encounter Date: 04/09/2021   PT End of Session - 04/09/21 1633     Visit Number 6    Date for PT Re-Evaluation 05/26/21    Authorization Type medicare    Progress Note Due on Visit 10    PT Start Time 1435    PT Stop Time 1510    PT Time Calculation (min) 35 min    Activity Tolerance Patient tolerated treatment well    Behavior During Therapy [facility] for tasks assessed/performed             Past Medical History:  Diagnosis Date   Allergy    Arthritis    Bilateral carotid bruits 03/2003   [place] 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 Cristina Gong   High cholesterol    HTN (hypertension) 07/17/2015   Echo 08/2019: EF 32-44, grade 1 diastolic dysfunction, mild apical HK, normal RV SF, trivial pericardial effusion, moderate MAC, mild MR, mild TR   Hypothyroidism    Insomnia    Lactose intolerance    Lumbar degenerative disc disease    Myoview    Myoview 06/2019: EF 48, normal perfusion; low risk (EF normal by echo 10/2018)   Neuromuscular disorder (HCC)    Pronated ankles /Feet    Onychomycosis    Osteoarthritis    Osteoporosis    Second degree Mobitz II AV block    s/p PPM   Spinal stenosis    Thyroid disease    Varicose veins     Past Surgical History:  Procedure Laterality Date   ABDOMINAL HYSTERECTOMY     BLADDER REPAIR     blephoplasty bilateral  03/10/2014   CATARACT EXTRACTION W/ INTRAOCULAR LENS  IMPLANT, BILATERAL  7 & 8 2014    CHOLECYSTECTOMY     COLONOSCOPY  05/18/2012   EP IMPLANTABLE DEVICE N/A 08/16/2016   MDT Advisa MRI conditional PPM implanted by Dr Rayann Heman for mobitz II second degree AV block   EYE SURGERY Right 05/02/2018   laser eye surgery   herniated disc repair     INCONTINENCE SURGERY     JOINT REPLACEMENT     LAMINECTOMY  02/2011   L3-L5  Dr. Ellene Route   LUMBAR LAMINECTOMY/DECOMPRESSION MICRODISCECTOMY Right 09/21/2015   Procedure: Right Lumbar Three-FourMicrodiskectomy;  Surgeon: Kristeen Miss, MD;  Location: [facility];  Service: Neurosurgery;  Laterality: Right;  Right L3-4 Microdiskectomy   SHOULDER ARTHROSCOPY WITH ROTATOR CUFF REPAIR Right 08/2003   TONSILLECTOMY     TOTAL HIP ARTHROPLASTY     right 1999    There were no vitals filed for this visit.   Subjective Assessment - 04/09/21 1631     Subjective I have not tried going to the pool at [facility] yet.    Pertinent History abdominal hysterectomy, bladder repair, lumbar DDD, compression fracture    Currently in Pain? Yes    Pain Score 2     Pain Location Back  Pain Orientation Lower    Pain Descriptors / Indicators Aching    Aggravating Factors  If I stand and walk too long    Pain Relieving Factors rest    Multiple Pain Sites No            Patient seen for aquatic therapy today.  Treatment took place in water 2.5-4 feet deep depending upon activity.  Pt entered the pool via stars,extra time, heavy use of rails. Water temp 92 degrees F.  Seated water bench with 75% submersion Pt performed seated LE AROM exercises 20x in all planes, pain assessment, discussion of getting in the water at her residence. Pt agreed to try at least 1x before her next PT session.  Standing in mid chest depth: Walking in all 4 directions with yellow noodle for support 6x each way. Side stepping touching the side of the pool 2x, then no UE 2x. Wall exercises: increased reps to 15x Bil: hip 3 ways, heel raises, minin squats, single leg stance Bil 10 sec  2x 2 finger support. Standing marching with yellow noodle 10x for balance                             PT Short Term Goals - 03/26/21 1133       PT SHORT TERM GOAL #1   Title ind with initial HEP    Status Achieved      PT SHORT TERM GOAL #2   Title Pt will demonstrate 5/5 hip strength for impoved gait    Status On-going               PT Long Term Goals - 03/26/21 1134       PT LONG TERM GOAL #1   Title pt will notice she is having less shortness of breath when walking around the community    Baseline SOB walking from front office to back room initially; today was not as bad    Status On-going      PT LONG TERM GOAL #2   Title Being able to get to the bathroom without leakage even after sitting for 2 hours    Baseline getting to the bathroom unless I wait 3 hours    Status Achieved      PT LONG TERM GOAL #3   Title Pt will be able to demonstrate improved upright posture due to improved core and back strength and mobility    Baseline got brace and feels good with the new posture support    Status On-going      PT LONG TERM GOAL #4   Title Pt will be ind with HEP and able to return to aquatics classes that she was previously attending    Status On-going                   Plan - 04/09/21 1634     Clinical Impression Statement Pt arrives to aquatic PT with mild back pain. Pt needed extra time getting her pool shoes on today. Pt has not gotten in the pool at [place] yet. PTA suggested trying to get in the pool 1x to see how comfortable she is before her next PT session. Pt agreed to try. Pt required no rest breaks today, in fact pt seemed to roll right through her exercises quickly today. Balance was challenged in single leg stance and marching. No pain reported.    Personal Factors and Comorbidities Comorbidity 3+  Comorbidities abdominal hysterectomy, bladder repair, lumbar DDD    Examination-Activity Limitations  Toileting;Continence;Sit    Examination-Participation Restrictions Community Activity    Stability/Clinical Decision Making Evolving/Moderate complexity    Rehab Potential Excellent    PT Frequency 2x / week    PT Duration 12 weeks    PT Treatment/Interventions ADLs/Self Care Home Management;Aquatic Therapy;Biofeedback;Cryotherapy;Electrical Stimulation;Moist Heat;Neuromuscular re-education;Therapeutic exercise;Therapeutic activities;Gait training;Manual techniques;Taping;Patient/family education    PT Next Visit Plan working on hip strength - re-assess MMT next visit; posture strength in standing as tolerated; walking and standing hip series    PT Home Exercise Plan Access Code: [email or web address]    Consulted and Agree with Plan of Care Patient             Patient will benefit from skilled therapeutic intervention in order to improve the following deficits and impairments:  Abnormal gait, Postural dysfunction, Decreased strength, Impaired flexibility  Visit Diagnosis: Abnormal posture  Muscle weakness (generalized)     Problem List Patient Active Problem List   Diagnosis Date Noted   Hyperglycemia 03/18/2020   Flat foot 03/18/2020   Kyphoscoliosis 10/30/2019   Closed compression fracture of L3 lumbar vertebra, initial encounter ([place]) 10/30/2019   Venous insufficiency of both lower extremities 10/30/2019   Balance problem 10/30/2019   Aortic atherosclerosis ([place]) 11/07/2018   Tongue lesion 05/24/2017   S/P placement of cardiac pacemaker 01/11/2017   Second degree Mobitz II AV block 08/16/2016   General weakness 08/15/2016   Bradycardia 08/15/2016   Gastroesophageal reflux disease without esophagitis 02/03/2016   Hiatal hernia 02/03/2016   Allergic sinusitis 02/03/2016   Senile osteoporosis 02/03/2016   DDD (degenerative disc disease), lumbar 02/03/2016   Hypothyroidism due to acquired atrophy of thyroid 02/03/2016   Frequent loose stools 02/03/2016   Basal cell carcinoma of  lower back 02/03/2016   Lactose intolerance in adult 02/03/2016   Glaucoma 02/03/2016   Herniated nucleus pulposus, L3-4 right 09/21/2015   Piriformis syndrome 07/17/2015   Constipation 07/17/2015   HLD (hyperlipidemia) 07/17/2015   Anxiety state 07/17/2015   HTN (hypertension) 07/17/2015    Daouda Lonzo, PTA 04/09/2021, 4:39 PM  [facility] [address] [place], [place], [address] Phone: [phone]   Fax:  [phone]  Name: JALEEAH SLIGHT MRN: [id number] Date of Birth: August 22, 1928

## 2021-04-13 ENCOUNTER — Encounter: Payer: Self-pay | Admitting: Physical Therapy

## 2021-04-13 ENCOUNTER — Ambulatory Visit: Payer: Medicare Other | Admitting: Physical Therapy

## 2021-04-13 ENCOUNTER — Other Ambulatory Visit: Payer: Self-pay

## 2021-04-13 DIAGNOSIS — M6281 Muscle weakness (generalized): Secondary | ICD-10-CM

## 2021-04-13 DIAGNOSIS — R293 Abnormal posture: Secondary | ICD-10-CM

## 2021-04-13 NOTE — Therapy (Addendum)
[facility] [address], [place], [place], [address] Phone: [phone]   Fax:  [phone]  Physical Therapy Treatment  Patient Details  Name: Destiny [name] MRN: [id number] Date of Birth: 07-03-1928 Referring Provider (PT): Jaquita Folds, MD   Encounter Date: 04/13/2021   PT End of Session - 04/13/21 1500     Visit Number 7    Date for PT Re-Evaluation 05/26/21    Authorization Type medicare    PT Start Time 1444    PT Stop Time 1528    PT Time Calculation (min) 44 min    Activity Tolerance Patient tolerated treatment well    Behavior During Therapy [facility] for tasks assessed/performed             Past Medical History:  Diagnosis Date   Allergy    Arthritis    Bilateral carotid bruits 03/2003   [place] 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 Cristina Gong   High cholesterol    HTN (hypertension) 07/17/2015   Echo 08/2019: EF 16-96, grade 1 diastolic dysfunction, mild apical HK, normal RV SF, trivial pericardial effusion, moderate MAC, mild MR, mild TR   Hypothyroidism    Insomnia    Lactose intolerance    Lumbar degenerative disc disease    Myoview    Myoview 06/2019: EF 48, normal perfusion; low risk (EF normal by echo 10/2018)   Neuromuscular disorder (HCC)    Pronated ankles /Feet    Onychomycosis    Osteoarthritis    Osteoporosis    Second degree Mobitz II AV block    s/p PPM   Spinal stenosis    Thyroid disease    Varicose veins     Past Surgical History:  Procedure Laterality Date   ABDOMINAL HYSTERECTOMY     BLADDER REPAIR     blephoplasty bilateral  03/10/2014   CATARACT EXTRACTION W/ INTRAOCULAR LENS  IMPLANT, BILATERAL  7 & 8 2014   CHOLECYSTECTOMY     COLONOSCOPY   05/18/2012   EP IMPLANTABLE DEVICE N/A 08/16/2016   MDT Advisa MRI conditional PPM implanted by Dr Rayann Heman for mobitz II second degree AV block   EYE SURGERY Right 05/02/2018   laser eye surgery   herniated disc repair     INCONTINENCE SURGERY     JOINT REPLACEMENT     LAMINECTOMY  02/2011   L3-L5  Dr. Ellene Route   LUMBAR LAMINECTOMY/DECOMPRESSION MICRODISCECTOMY Right 09/21/2015   Procedure: Right Lumbar Three-FourMicrodiskectomy;  Surgeon: Kristeen Miss, MD;  Location: [facility];  Service: Neurosurgery;  Laterality: Right;  Right L3-4 Microdiskectomy   SHOULDER ARTHROSCOPY WITH ROTATOR CUFF REPAIR Right 08/2003   TONSILLECTOMY     TOTAL HIP ARTHROPLASTY     right 1999    There were no vitals filed for this visit.   Subjective Assessment - 04/13/21 1451     Subjective I haven't been to the pool yet, but I think I am doing a little more at home.  I need to be better at doing my homework. I have been to [place] to visit daughter who broke pelvis and collar bone due to injury when riding a horse.    Pertinent History abdominal  hysterectomy, bladder repair, lumbar DDD, compression fracture    Patient Stated Goals if she can go longer than going to the bathroom    Currently in Pain? Yes    Pain Score 3     Pain Location Back    Pain Orientation Lower    Pain Descriptors / Indicators Aching    Pain Type Chronic pain    Pain Onset More than a month ago    Pain Frequency Intermittent    Aggravating Factors  later in the day hurts more    Pain Relieving Factors tylenol in the afternoon which I didn't take today    Multiple Pain Sites No                               [facility] Adult PT Treatment/Exercise - 04/13/21 0001       Lumbar Exercises: Stretches   Active Hamstring Stretch Right;Left;1 rep;30 seconds      Lumbar Exercises: Aerobic   Nustep L1 x 8 min  UE/LE      Lumbar Exercises: Standing   Other Standing Lumbar Exercises hip abduction and flexion - abduction  1.5 lb weight - 10x each side      Lumbar Exercises: Seated   Long Arc Quad on Chair Strengthening;Both;20 reps    LAQ on Chair Limitations ball squeeze    Sit to Stand 10 reps   2 sets - 1 set not using hands upon standing   Other Seated Lumbar Exercises clam blue loop; march add 1.5lb, ball squeezes - 10x each    Other Seated Lumbar Exercises unsupported back sitting on mat for above ex's                    PT Education - 04/13/21 1531     Education Details Access Code: [email or web address]    Person(s) Educated Patient    Methods Explanation;Demonstration;Tactile cues;Verbal cues;Handout    Comprehension Verbalized understanding;Returned demonstration              PT Short Term Goals - 03/26/21 1133       PT SHORT TERM GOAL #1   Title ind with initial HEP    Status Achieved      PT SHORT TERM GOAL #2   Title Pt will demonstrate 5/5 hip strength for impoved gait    Status On-going               PT Long Term Goals - 04/13/21 1507       PT LONG TERM GOAL #1   Title pt will notice she is having less shortness of breath when walking around the community    Baseline improved    Status Achieved      PT LONG TERM GOAL #2   Title Being able to get to the bathroom without leakage even after sitting for 2 hours    Status Achieved      PT LONG TERM GOAL #3   Title Pt will be able to demonstrate improved upright posture due to improved core and back strength and mobility    Baseline was able to sit without back support and do standing exercises with maximum upright posture today    Status Achieved      PT LONG TERM GOAL #4   Title Pt will be ind with HEP and able to return to aquatics classes that she was previously attending    Baseline I haven't  been to the pool yet    Status On-going                   Plan - 04/13/21 1531     Clinical Impression Statement Pt is doing well with exercises and met goal for reduced SOB.  She has met goal for leakage.   She has remaining goal for return to pool .  Pt will benefit from skilled PT to work on exercises that she can do to feel confident in return to her exercises in the pool.    PT Treatment/Interventions ADLs/Self Care Home Management;Aquatic Therapy;Biofeedback;Cryotherapy;Electrical Stimulation;Moist Heat;Neuromuscular re-education;Therapeutic exercise;Therapeutic activities;Gait training;Manual techniques;Taping;Patient/family education    PT Next Visit Plan d/c next visit after review and education on return to pool exercises    PT Home Exercise Plan Access Code: [email or web address]    Consulted and Agree with Plan of Care Patient             Patient will benefit from skilled therapeutic intervention in order to improve the following deficits and impairments:  Abnormal gait, Postural dysfunction, Decreased strength, Impaired flexibility  Visit Diagnosis: Abnormal posture  Muscle weakness (generalized)     Problem List Patient Active Problem List   Diagnosis Date Noted   Hyperglycemia 03/18/2020   Flat foot 03/18/2020   Kyphoscoliosis 10/30/2019   Closed compression fracture of L3 lumbar vertebra, initial encounter ([place]) 10/30/2019   Venous insufficiency of both lower extremities 10/30/2019   Balance problem 10/30/2019   Aortic atherosclerosis ([place]) 11/07/2018   Tongue lesion 05/24/2017   S/P placement of cardiac pacemaker 01/11/2017   Second degree Mobitz II AV block 08/16/2016   General weakness 08/15/2016   Bradycardia 08/15/2016   Gastroesophageal reflux disease without esophagitis 02/03/2016   Hiatal hernia 02/03/2016   Allergic sinusitis 02/03/2016   Senile osteoporosis 02/03/2016   DDD (degenerative disc disease), lumbar 02/03/2016   Hypothyroidism due to acquired atrophy of thyroid 02/03/2016   Frequent loose stools 02/03/2016   Basal cell carcinoma of lower back 02/03/2016   Lactose intolerance in adult 02/03/2016   Glaucoma 02/03/2016   Herniated nucleus pulposus, L3-4  right 09/21/2015   Piriformis syndrome 07/17/2015   Constipation 07/17/2015   HLD (hyperlipidemia) 07/17/2015   Anxiety state 07/17/2015   HTN (hypertension) 07/17/2015    Jule Ser, PT 04/13/2021, 5:20 PM  [facility] [address], [place], [place], [address] Phone: [phone]   Fax:  [phone]  Name: Destiny [name] MRN: [id number] Date of Birth: 1927-09-26   PHYSICAL THERAPY DISCHARGE SUMMARY  Visits from Start of Care: 7  Current functional level related to goals / functional outcomes: See above goals   Remaining deficits: See above   Education / Equipment: HEP   Patient agrees to discharge. Patient goals were mostly met. Patient is being discharged due to the patient's request.  Gustavus Bryant, PT 05/04/21 3:21 PM

## 2021-04-13 NOTE — Patient Instructions (Signed)
Access Code: [email or web address] URL: [email or web address] Date: 04/13/2021 Prepared by: Jari Favre  Exercises Supine Hip Adductor Squeeze with Small Ball - 3 x daily - 7 x weekly - 1 sets - 10 reps - 3 sec hold Supine Shoulder External Rotation with Resistance - 1 x daily - 7 x weekly - 2 sets - 10 reps Supine Hip Extension with Towel Roll (BKA) - 1 x daily - 7 x weekly - 2 sets - 10 reps - 5sec hold Supine Lower Trunk Rotation - 1 x daily - 7 x weekly - 1 sets - 10 reps - 5 sec hold Open Book Chest Stretch on Towel Roll - 1 x daily - 7 x weekly - 1 sets - 5 reps - 10 hold Seated Shoulder Setting - 1 x daily - 7 x weekly - 3 sets - 10 reps Standing March with Counter Support - 1 x daily - 7 x weekly - 3 sets - 10 reps Standing Hip Abduction with Counter Support - 1 x daily - 7 x weekly - 3 sets - 10 reps

## 2021-04-16 ENCOUNTER — Ambulatory Visit: Payer: Medicare Other | Admitting: Physical Therapy

## 2021-04-19 ENCOUNTER — Ambulatory Visit: Payer: Medicare Other | Admitting: Physical Therapy

## 2021-04-19 ENCOUNTER — Other Ambulatory Visit: Payer: Self-pay | Admitting: Internal Medicine

## 2021-04-26 ENCOUNTER — Other Ambulatory Visit: Payer: Self-pay | Admitting: Internal Medicine

## 2021-04-26 ENCOUNTER — Non-Acute Institutional Stay: Payer: Medicare Other | Admitting: Adult Health

## 2021-04-26 ENCOUNTER — Other Ambulatory Visit: Payer: Self-pay

## 2021-04-26 ENCOUNTER — Encounter: Payer: Self-pay | Admitting: Adult Health

## 2021-04-26 VITALS — BP 146/82 | HR 84 | Temp 98.2°F | Ht 63.0 in | Wt 136.2 lb

## 2021-04-26 DIAGNOSIS — I10 Essential (primary) hypertension: Secondary | ICD-10-CM | POA: Diagnosis not present

## 2021-04-26 DIAGNOSIS — I872 Venous insufficiency (chronic) (peripheral): Secondary | ICD-10-CM | POA: Diagnosis not present

## 2021-04-26 MED ORDER — METOPROLOL SUCCINATE ER 25 MG PO TB24: 12.5000 mg | ORAL_TABLET | Freq: Every day | ORAL | 0 refills | Status: DC

## 2021-04-26 NOTE — Patient Instructions (Signed)
Recommend trying the Toprol in the evening and see if this helps with you feeling sluggish Let us know if you do not feel better

## 2021-04-26 NOTE — Progress Notes (Signed)
Location: [facility]  POS: clinic  Provider:  Cindi Carbon, [place] [phone]   Code Status: DNR Goals of Care:  Advanced Directives 03/17/2021  Does Patient Have a Medical Advance Directive? Yes  Type of Paramedic of [place];Living will  Does patient want to make changes to medical advance directive? No - Patient declined  Copy of [place] in Chart? (No Data)  Pre-existing out of facility DNR order (yellow form or pink MOST form) -     Chief Complaint  Patient presents with   Medical Management of Chronic Issues    Medical Management of Chronic Issues. BP Follow up    HPI: Patient is a 87 y.o. female seen today for medical management of chronic diseases.  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   Due to elevated bp and edema she was started on Toprol and lasix 03/17/21, here for follow up Feels listless lately and wonders if it is due to her age, more noticeable in the past.  She did not take lasix today because she did not want to have to go to the BR but normally she takes it every day. Her edema is better. Her weight is down 1 lb.  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 Cristina Gong   High cholesterol    HTN (hypertension) 07/17/2015   Echo 08/2019: EF 63-89, grade 1 diastolic dysfunction, mild apical HK, normal RV SF, trivial pericardial effusion, moderate MAC, mild MR, mild TR   Hypothyroidism    Insomnia    Lactose intolerance     Lumbar degenerative disc disease    Myoview    Myoview 06/2019: EF 48, normal perfusion; low risk (EF normal by echo 10/2018)   Neuromuscular disorder (HCC)    Pronated ankles /Feet    Onychomycosis    Osteoarthritis    Osteoporosis    Second degree Mobitz II AV block    s/p PPM   Spinal stenosis    Thyroid disease    Varicose veins     Past Surgical History:  Procedure Laterality Date   ABDOMINAL HYSTERECTOMY     BLADDER REPAIR     blephoplasty bilateral  03/10/2014   CATARACT EXTRACTION W/ INTRAOCULAR LENS  IMPLANT, BILATERAL  7 & 8 2014   CHOLECYSTECTOMY     COLONOSCOPY  05/18/2012   EP IMPLANTABLE DEVICE N/A 08/16/2016   MDT Advisa MRI conditional PPM implanted by Dr Rayann Heman for mobitz II second degree AV block   EYE SURGERY Right 05/02/2018   laser eye surgery   herniated disc repair     INCONTINENCE SURGERY     JOINT REPLACEMENT     LAMINECTOMY  02/2011   L3-L5  Dr. Ellene Route   LUMBAR LAMINECTOMY/DECOMPRESSION MICRODISCECTOMY Right 09/21/2015   Procedure: Right Lumbar Three-FourMicrodiskectomy;  Surgeon: Kristeen Miss, MD;  Location: [facility];  Service: Neurosurgery;  Laterality: Right;  Right L3-4 Microdiskectomy   SHOULDER ARTHROSCOPY WITH ROTATOR CUFF REPAIR Right 08/2003   TONSILLECTOMY     TOTAL HIP ARTHROPLASTY     right 1999    Allergies  Allergen Reactions   Codeine Nausea Only   Macrobid [Nitrofurantoin] Diarrhea   Miralax [Polyethylene Glycol] Other (See Comments)    cramping   Other Nausea And Vomiting and Other (See Comments)   Pain Relief [Acetaminophen] Nausea And Vomiting    Patient states she is not allergic    Paxil [Paroxetine Hcl] Nausea Only   Percodan [Oxycodone-Aspirin] Itching   Oxycodone Nausea Only   Oxycodone-Acetaminophen Nausea Only   Penicillins Rash    Has patient had a PCN reaction causing immediate rash, facial/tongue/throat swelling, SOB or lightheadedness with hypotension: YES Has patient had a PCN reaction causing severe rash  involving mucus membranes or skin necrosis: YES Has patient had a PCN reaction that required hospitalization NO Has patient had a PCN reaction occurring within the last 10 years: NO If all of the above answers are "NO", then may proceed with Cephalosporin use.    Outpatient Encounter Medications as of 04/26/2021  Medication Sig   Calcium-Phosphorus-Vitamin D [id number] MG-MG-UNIT CHEW Chew 2 tablets by mouth daily.    Cyanocobalamin (VITAMIN B 12 PO) Take 1 tablet by mouth daily.   diltiazem (CARDIZEM CD) 240 MG 24 hr capsule TAKE ONE CAPSULE EVERY DAY   dorzolamide-timolol (COSOPT) 22.3-6.8 MG/ML ophthalmic solution Place 1 drop into both eyes 2 (two) times daily.   furosemide (LASIX) 20 MG tablet Take 1 tablet (20 mg total) by mouth every other day.   ipratropium (ATROVENT) 0.06 % nasal spray USE 2 SPRAYS IN EACH NOSTRIL 3 TIMES A DAY AS NEEDED.   irbesartan (AVAPRO) 300 MG tablet Take 1 tablet (300 mg total) by mouth daily.   LACTASE PO Take 1 tablet by mouth as needed (when eating dairy products). Take as directed   loratadine (CLARITIN) 10 MG tablet Take 1 tablet (10 mg total) by mouth daily.   LUMIGAN 0.01 % SOLN Place 1 drop into both eyes 2 (two) times daily.    metoprolol succinate (TOPROL XL) 25 MG 24 hr tablet Take 0.5 tablets (12.5 mg total) by mouth daily.   Multiple Vitamins-Minerals (MULTIVITAMIN WITH MINERALS) tablet Take 1 tablet by mouth daily.   Netarsudil Dimesylate 0.02 % SOLN Apply 1 drop to eye at bedtime. Right eye   Probiotic Product (ALIGN) 4 MG CAPS Take by mouth daily.   rosuvastatin (CRESTOR) 5 MG tablet TAKE 1 TABLET ONCE DAILY FOR CHOLESTEROL.   SYNTHROID 125 MCG tablet TAKE 1 TABLET ONCE DAILY.   UNABLE TO FIND Take by mouth as needed. Med Name: OTC purified peppermint oil for IBS - 3 caps before lunch and dinner   UNABLE TO FIND Med Name:Hemeopathic ear drops  2 drops twice daily   Vibegron (GEMTESA) 75 MG TABS Take 1 tablet by mouth daily.   [DISCONTINUED]  diltiazem (CARDIZEM CD) 240 MG 24 hr capsule TAKE 1 CAPSULE EVERY DAY.   [DISCONTINUED] Vibegron 75 MG TABS Take 1 tablet by mouth daily.   No facility-administered encounter medications on file as of 04/26/2021.    Review of Systems:  Review of Systems  Constitutional:  Positive for fatigue. Negative for activity change, appetite change, chills, diaphoresis, fever and unexpected weight change.  HENT:  Negative for congestion.   Respiratory:  Negative for cough, shortness of breath and wheezing.   Cardiovascular:  Positive for leg swelling. Negative for  chest pain and palpitations.  Gastrointestinal:  Negative for abdominal distention, abdominal pain, constipation and diarrhea.  Genitourinary:  Negative for difficulty urinating and dysuria.  Musculoskeletal:  Negative for arthralgias, back pain, gait problem, joint swelling and myalgias.  Neurological:  Negative for dizziness, tremors, seizures, syncope, facial asymmetry, speech difficulty, weakness, light-headedness, numbness and headaches.  Psychiatric/Behavioral:  Negative for agitation, behavioral problems and confusion.    Health Maintenance  Topic Date Due   PNA vac Low Risk Adult (2 of 2 - PCV13) 07/15/2015   COVID-19 Vaccine (4 - Booster for Moderna series) 10/11/2020   INFLUENZA VACCINE  03/29/2021   TETANUS/TDAP  06/19/2022   DEXA SCAN  Completed   Zoster Vaccines- Shingrix  Completed   HPV VACCINES  Aged Out    Physical Exam: Vitals:   04/26/21 1338  BP: (!) 146/82  Pulse: 84  Temp: 98.2 F (36.8 C)  TempSrc: Skin  SpO2: 96%  Weight: 136 lb 3.2 oz (61.8 kg)  Height: _0  (1.6 m)   Body mass index is 24.13 kg/m. Physical Exam Vitals and nursing note reviewed.  Constitutional:      General: She is not in acute distress.    Appearance: She is not diaphoretic.  HENT:     Head: Normocephalic and atraumatic.  Neck:     Vascular: No JVD.  Cardiovascular:     Rate and Rhythm: Normal rate and regular rhythm.      Heart sounds: No murmur heard. Pulmonary:     Effort: Pulmonary effort is normal. No respiratory distress.     Breath sounds: Normal breath sounds. No wheezing.  Musculoskeletal:     Comments: BLE edema +1 with varicose veins noted to both legs.   Skin:    General: Skin is warm and dry.  Neurological:     Mental Status: She is alert and oriented to person, place, and time.    Labs reviewed: Basic Metabolic Panel: Recent Labs    06/25/20 0000 03/11/21 0000  NA 141 141  K 4.2 4.1  CL 106 105  CO2 25* 23*  BUN 15 12  CREATININE 0.5 0.5  CALCIUM 9.7 10.0  TSH 0.93  --    Liver Function Tests: Recent Labs    06/25/20 0000  AST 21  ALT 19  ALBUMIN 4.3   No results for input(s): LIPASE, AMYLASE in the last 8760 hours. No results for input(s): AMMONIA in the last 8760 hours. CBC: Recent Labs    06/25/20 0000 03/11/21 0000  WBC 6.1 5.5  HGB 13.6 13.0  HCT 41 39  PLT 214 211   Lipid Panel: Recent Labs    06/25/20 0000  CHOL 163  HDL 76*  TRIG 108   Lab Results  Component Value Date   HGBA1C 5.8 03/11/2021    Procedures since last visit: [place]  Result Date: 04/07/2021 Scheduled remote reviewed. Normal device function.  RV lead impedance and threshold have risen recently, sent to triage Next remote 91 days. Kathy Breach, RN, CCDS, [employer]   Assessment/Plan  1. Primary hypertension Improved Continue Toprol 12.5 but change admin time to the evening to improve feelings of fatigue. If there is still no improvement please call our office.   2. Venous insufficiency of both lower extremities She has less edema in both legs Continue lasix 40 m qd and encourage low salt diet and elevation   Recommend 4th booster of Covid Recommend flu vaccine    Labs/tests  ordered:  * No order type specified * Next appt:  05/31/2021

## 2021-04-30 NOTE — Progress Notes (Signed)
Remote pacemaker transmission.   

## 2021-05-04 ENCOUNTER — Other Ambulatory Visit: Payer: Self-pay | Admitting: Adult Health

## 2021-05-09 ENCOUNTER — Other Ambulatory Visit: Payer: Self-pay

## 2021-05-09 ENCOUNTER — Emergency Department (HOSPITAL_BASED_OUTPATIENT_CLINIC_OR_DEPARTMENT_OTHER): Payer: Medicare Other

## 2021-05-09 ENCOUNTER — Emergency Department (HOSPITAL_BASED_OUTPATIENT_CLINIC_OR_DEPARTMENT_OTHER): Payer: Medicare Other | Admitting: Radiology

## 2021-05-09 ENCOUNTER — Emergency Department (HOSPITAL_BASED_OUTPATIENT_CLINIC_OR_DEPARTMENT_OTHER)
Admission: EM | Admit: 2021-05-09 | Discharge: 2021-05-09 | Disposition: A | Discharge status: Home / Self Care | Payer: Medicare Other | Attending: Emergency Medicine | Admitting: Emergency Medicine

## 2021-05-09 ENCOUNTER — Encounter (HOSPITAL_BASED_OUTPATIENT_CLINIC_OR_DEPARTMENT_OTHER): Payer: Self-pay | Admitting: Emergency Medicine

## 2021-05-09 DIAGNOSIS — S0083XA Contusion of other part of head, initial encounter: Secondary | ICD-10-CM | POA: Diagnosis not present

## 2021-05-09 DIAGNOSIS — S0081XA Abrasion of other part of head, initial encounter: Secondary | ICD-10-CM | POA: Diagnosis not present

## 2021-05-09 DIAGNOSIS — M79641 Pain in right hand: Secondary | ICD-10-CM | POA: Diagnosis not present

## 2021-05-09 DIAGNOSIS — Z950 Presence of cardiac pacemaker: Secondary | ICD-10-CM | POA: Diagnosis not present

## 2021-05-09 DIAGNOSIS — Z79899 Other long term (current) drug therapy: Secondary | ICD-10-CM | POA: Diagnosis not present

## 2021-05-09 DIAGNOSIS — Z23 Encounter for immunization: Secondary | ICD-10-CM | POA: Insufficient documentation

## 2021-05-09 DIAGNOSIS — Y9301 Activity, walking, marching and hiking: Secondary | ICD-10-CM | POA: Insufficient documentation

## 2021-05-09 DIAGNOSIS — W19XXXA Unspecified fall, initial encounter: Secondary | ICD-10-CM | POA: Diagnosis not present

## 2021-05-09 DIAGNOSIS — T07XXXA Unspecified multiple injuries, initial encounter: Secondary | ICD-10-CM

## 2021-05-09 DIAGNOSIS — I10 Essential (primary) hypertension: Secondary | ICD-10-CM | POA: Diagnosis not present

## 2021-05-09 DIAGNOSIS — E039 Hypothyroidism, unspecified: Secondary | ICD-10-CM | POA: Insufficient documentation

## 2021-05-09 DIAGNOSIS — G4489 Other headache syndrome: Secondary | ICD-10-CM | POA: Diagnosis not present

## 2021-05-09 DIAGNOSIS — W01198A Fall on same level from slipping, tripping and stumbling with subsequent striking against other object, initial encounter: Secondary | ICD-10-CM | POA: Insufficient documentation

## 2021-05-09 DIAGNOSIS — R0902 Hypoxemia: Secondary | ICD-10-CM | POA: Diagnosis not present

## 2021-05-09 DIAGNOSIS — S0990XA Unspecified injury of head, initial encounter: Secondary | ICD-10-CM | POA: Diagnosis not present

## 2021-05-09 DIAGNOSIS — S0003XA Contusion of scalp, initial encounter: Secondary | ICD-10-CM | POA: Diagnosis not present

## 2021-05-09 DIAGNOSIS — M47812 Spondylosis without myelopathy or radiculopathy, cervical region: Secondary | ICD-10-CM | POA: Diagnosis not present

## 2021-05-09 DIAGNOSIS — M19041 Primary osteoarthritis, right hand: Secondary | ICD-10-CM | POA: Diagnosis not present

## 2021-05-09 DIAGNOSIS — S0181XA Laceration without foreign body of other part of head, initial encounter: Secondary | ICD-10-CM | POA: Diagnosis not present

## 2021-05-09 DIAGNOSIS — Z96641 Presence of right artificial hip joint: Secondary | ICD-10-CM | POA: Diagnosis not present

## 2021-05-09 DIAGNOSIS — Z85828 Personal history of other malignant neoplasm of skin: Secondary | ICD-10-CM | POA: Insufficient documentation

## 2021-05-09 DIAGNOSIS — Y92512 Supermarket, store or market as the place of occurrence of the external cause: Secondary | ICD-10-CM | POA: Insufficient documentation

## 2021-05-09 DIAGNOSIS — T148XXA Other injury of unspecified body region, initial encounter: Secondary | ICD-10-CM

## 2021-05-09 DIAGNOSIS — M4802 Spinal stenosis, cervical region: Secondary | ICD-10-CM | POA: Diagnosis not present

## 2021-05-09 MED ORDER — TETANUS-DIPHTH-ACELL PERTUSSIS 5-2.5-18.5 LF-MCG/0.5 IM SUSY
0.5000 mL | PREFILLED_SYRINGE | Freq: Once | INTRAMUSCULAR | Status: AC
  Administered 2021-05-09: 0.5 mL via INTRAMUSCULAR
  Filled 2021-05-09: qty 0.5

## 2021-05-09 NOTE — ED Provider Notes (Signed)
[facility] EMERGENCY DEPT Provider Note   CSN: [id number] Arrival date & time: 05/09/21  1553     History Chief Complaint  Patient presents with   Dakiya Puopolo is a 87 y.o. female.  The history is provided by the patient and medical records. No language interpreter was used.  Fall This is a new problem. The current episode started less than 1 hour ago. The problem occurs constantly. The problem has not changed since onset.Associated symptoms include headaches. Pertinent negatives include no chest pain, no abdominal pain and no shortness of breath. Nothing aggravates the symptoms. Nothing relieves the symptoms. She has tried nothing for the symptoms. The treatment provided no relief.      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 Cristina Gong   High cholesterol    HTN (hypertension) 07/17/2015   Echo 08/2019: EF 40-98, grade 1 diastolic dysfunction, mild apical HK, normal RV SF, trivial pericardial effusion, moderate MAC, mild MR, mild TR   Hypothyroidism    Insomnia    Lactose intolerance    Lumbar degenerative disc disease    Myoview    Myoview 06/2019: EF 48, normal perfusion; low risk (EF normal by echo 10/2018)   Neuromuscular disorder (HCC)    Pronated ankles /Feet    Onychomycosis    Osteoarthritis    Osteoporosis    Second degree Mobitz II AV block    s/p PPM   Spinal stenosis    Thyroid disease    Varicose veins     Patient Active Problem List   Diagnosis Date Noted   Hyperglycemia 03/18/2020   Flat foot 03/18/2020   Kyphoscoliosis 10/30/2019   Closed compression fracture of L3 lumbar vertebra, initial encounter ([place])  10/30/2019   Venous insufficiency of both lower extremities 10/30/2019   Balance problem 10/30/2019   Aortic atherosclerosis ([place]) 11/07/2018   Tongue lesion 05/24/2017   S/P placement of cardiac pacemaker 01/11/2017   Second degree Mobitz II AV block 08/16/2016   General weakness 08/15/2016   Bradycardia 08/15/2016   Gastroesophageal reflux disease without esophagitis 02/03/2016   Hiatal hernia 02/03/2016   Allergic sinusitis 02/03/2016   Senile osteoporosis 02/03/2016   DDD (degenerative disc disease), lumbar 02/03/2016   Hypothyroidism due to acquired atrophy of thyroid 02/03/2016   Frequent loose stools 02/03/2016   Basal cell carcinoma of lower back 02/03/2016   Lactose intolerance in adult 02/03/2016   Glaucoma 02/03/2016   Herniated nucleus pulposus, L3-4 right 09/21/2015   Piriformis syndrome 07/17/2015   Constipation 07/17/2015   HLD (hyperlipidemia) 07/17/2015   Anxiety state 07/17/2015   HTN (hypertension) 07/17/2015    Past Surgical History:  Procedure Laterality Date   ABDOMINAL HYSTERECTOMY     BLADDER REPAIR     blephoplasty bilateral  03/10/2014   CATARACT EXTRACTION W/ INTRAOCULAR LENS  IMPLANT, BILATERAL  7 & 8 2014   CHOLECYSTECTOMY     COLONOSCOPY  05/18/2012   EP IMPLANTABLE DEVICE N/A 08/16/2016   MDT [employer] MRI conditional PPM implanted by Dr Rayann Heman for mobitz II second degree AV  block   EYE SURGERY Right 05/02/2018   laser eye surgery   herniated disc repair     INCONTINENCE SURGERY     JOINT REPLACEMENT     LAMINECTOMY  02/2011   L3-L5  Dr. Ellene Route   LUMBAR LAMINECTOMY/DECOMPRESSION MICRODISCECTOMY Right 09/21/2015   Procedure: Right Lumbar Three-FourMicrodiskectomy;  Surgeon: Kristeen Miss, MD;  Location: [facility];  Service: Neurosurgery;  Laterality: Right;  Right L3-4 Microdiskectomy   SHOULDER ARTHROSCOPY WITH ROTATOR CUFF REPAIR Right 08/2003   TONSILLECTOMY     TOTAL HIP ARTHROPLASTY     right 1999     OB History     Gravida  3    Para  3   Term      Preterm      AB      Living  3      SAB      IAB      Ectopic      Multiple      Live Births              Family History  Problem Relation Age of Onset   Arthritis Mother    Osteoporosis Mother    Heart attack Father     Social History   Tobacco Use   Smoking status: Never   Smokeless tobacco: Never  Vaping Use   Vaping Use: Never used  Substance Use Topics   Alcohol use: No   Drug use: No    Home Medications Prior to Admission medications   Medication Sig Start Date End Date Taking? Authorizing Provider  Calcium-Phosphorus-Vitamin D [phone] MG-MG-UNIT CHEW Chew 2 tablets by mouth daily.     [provider]  Cyanocobalamin (VITAMIN B 12 PO) Take 1 tablet by mouth daily.    [provider]  diltiazem (CARDIZEM CD) 240 MG 24 hr capsule TAKE ONE CAPSULE EVERY DAY 04/26/21   Virgie Dad, MD  dorzolamide-timolol (COSOPT) 22.3-6.8 MG/ML ophthalmic solution Place 1 drop into both eyes 2 (two) times daily. 07/07/15   [provider]  furosemide (LASIX) 20 MG tablet TAKE ONE TABLET BY MOUTH EVERY OTHER DAY. 05/04/21   Royal Hawthorn, NP  ipratropium (ATROVENT) 0.06 % nasal spray USE 2 SPRAYS IN EACH NOSTRIL 3 TIMES A DAY AS NEEDED. 12/03/20   Royal Hawthorn, NP  irbesartan (AVAPRO) 300 MG tablet Take 1 tablet (300 mg total) by mouth daily. 04/19/21   Virgie Dad, MD  LACTASE PO Take 1 tablet by mouth as needed (when eating dairy products). Take as directed    [provider]  loratadine (CLARITIN) 10 MG tablet Take 1 tablet (10 mg total) by mouth daily. 06/02/20   Ngetich, Dinah C, NP  LUMIGAN 0.01 % SOLN Place 1 drop into both eyes 2 (two) times daily.  07/16/17   [provider]  metoprolol succinate (TOPROL XL) 25 MG 24 hr tablet Take 0.5 tablets (12.5 mg total) by mouth at bedtime. 04/26/21   Royal Hawthorn, NP  Multiple Vitamins-Minerals (MULTIVITAMIN WITH MINERALS) tablet Take 1 tablet by  mouth daily.    [provider]  Netarsudil Dimesylate 0.02 % SOLN Apply 1 drop to eye at bedtime. Right eye    [provider]  Probiotic Product (ALIGN) 4 MG CAPS Take by mouth daily.    [provider]  rosuvastatin (CRESTOR) 5 MG tablet TAKE 1 TABLET ONCE DAILY FOR CHOLESTEROL. 04/20/21   Virgie Dad, MD  SYNTHROID 125 MCG tablet TAKE 1 TABLET  ONCE DAILY. 02/23/21   Virgie Dad, MD  UNABLE TO FIND Take by mouth as needed. Med Name: OTC purified peppermint oil for IBS - 3 caps before lunch and dinner    [provider]  UNABLE TO FIND Med Name:Hemeopathic ear drops  2 drops twice daily    [provider]  Vibegron (GEMTESA) 75 MG TABS Take 1 tablet by mouth daily.    [provider]    Allergies    Codeine, Macrobid [nitrofurantoin], Miralax [polyethylene glycol], Other, Pain relief [acetaminophen], Paxil [paroxetine hcl], Percodan [oxycodone-aspirin], Oxycodone, Oxycodone-acetaminophen, and Penicillins  Review of Systems   Review of Systems  Constitutional:  Negative for chills, fatigue and fever.  HENT:  Negative for congestion.   Respiratory:  Negative for cough, chest tightness and shortness of breath.   Cardiovascular:  Negative for chest pain.  Gastrointestinal:  Negative for abdominal pain, constipation, diarrhea and nausea.  Genitourinary:  Negative for dysuria and frequency.  Musculoskeletal:  Negative for back pain, neck pain and neck stiffness.  Skin:  Positive for wound. Negative for rash.  Neurological:  Positive for headaches. Negative for dizziness, weakness, light-headedness and numbness.  Psychiatric/Behavioral:  Negative for agitation and confusion.   All other systems reviewed and are negative.  Physical Exam Updated Vital Signs BP (!) 171/70 (BP Location: Right Arm)   Pulse 86   Temp 98.3 F (36.8 C) (Oral)   Resp 18   SpO2 95%   Physical Exam Vitals and nursing note reviewed.  Constitutional:       General: She is not in acute distress.    Appearance: She is well-developed. She is not ill-appearing, toxic-appearing or diaphoretic.  HENT:     Head: Abrasion and contusion present. No laceration.     Jaw: No trismus or tenderness.     Comments: Multiple abrasions to the face with a underlying hematoma on the right forehead, a linear vertical abrasion to the area below the nose, and a abrasion to the right cheek.  Some mild erythema on the anterior neck with no tenderness and patient denies any symptoms there.  No laceration seen that would require repair.    Mouth/Throat:     Mouth: Mucous membranes are moist.     Pharynx: No oropharyngeal exudate or posterior oropharyngeal erythema.  Eyes:     Extraocular Movements: Extraocular movements intact.     Conjunctiva/sclera: Conjunctivae normal.     Pupils: Pupils are equal, round, and reactive to light.  Cardiovascular:     Rate and Rhythm: Normal rate and regular rhythm.     Heart sounds: No murmur heard. Pulmonary:     Effort: Pulmonary effort is normal. No respiratory distress.     Breath sounds: Normal breath sounds. No wheezing, rhonchi or rales.  Chest:     Chest wall: No tenderness.  Abdominal:     General: Abdomen is flat.     Palpations: Abdomen is soft.     Tenderness: There is no abdominal tenderness. There is no right CVA tenderness, left CVA tenderness, guarding or rebound.  Musculoskeletal:        General: Tenderness and signs of injury present.     Right hand: Tenderness present.     Cervical back: Neck supple. No tenderness.     Comments: Tenderness on the right hand in the anatomic snuffbox.  No wrist tenderness.  Intact sensation, strength, and pulses.  No laceration seen.  Intact grip strength.   Skin:    General: Skin  is warm and dry.     Findings: Bruising present. No erythema.  Neurological:     General: No focal deficit present.     Mental Status: She is alert.     Sensory: No sensory deficit.     Motor:  No weakness.  Psychiatric:        Mood and Affect: Mood normal.    ED Results / Procedures / Treatments   Labs (all labs ordered are listed, but only abnormal results are displayed) Labs Reviewed - No data to display  EKG None  Radiology CT HEAD WO CONTRAST (5MM)  Result Date: 05/09/2021 CLINICAL DATA:  Head trauma laceration EXAM: CT HEAD WITHOUT CONTRAST TECHNIQUE: Contiguous axial images were obtained from the base of the skull through the vertex without intravenous contrast. COMPARISON:  None. FINDINGS: Brain: No acute territorial infarction, hemorrhage, or intracranial. Mild atrophy. Patchy white matter hypodensity consistent with chronic small vessel ischemic change. Nonenlarged ventricles. Vascular: No hyperdense vessels.  Carotid vascular calcification Skull: Normal. Negative for fracture or focal lesion. Sinuses/Orbits: No acute finding. Other: None IMPRESSION: 1. No CT evidence for acute intracranial abnormality. 2. Atrophy and chronic small vessel ischemic changes of white matter Electronically Signed   By: Donavan Foil M.D.   On: 05/09/2021 16:56   CT Cervical Spine Wo Contrast  Result Date: 05/09/2021 CLINICAL DATA:  Fall hit right cheek EXAM: CT CERVICAL SPINE WITHOUT CONTRAST TECHNIQUE: Multidetector CT imaging of the cervical spine was performed without intravenous contrast. Multiplanar CT image reconstructions were also generated. COMPARISON:  None. FINDINGS: Alignment: 4 mm anterolisthesis C4 on C5. Trace retrolisthesis C5 on C6. Trace anterolisthesis C7 on T1. Facet alignment is maintained Skull base and vertebrae: No acute fracture. No primary bone lesion or focal pathologic process. Soft tissues and spinal canal: No prevertebral fluid or swelling. No visible canal hematoma. Disc levels: Multilevel degenerative change. Moderate disc space narrowing C4-C5, advanced degenerative changes C5-C6 and C6-C7. Facet degenerative changes at multiple levels with foraminal stenosis Upper  chest: Negative. Other: None IMPRESSION: 1. 4 mm anterolisthesis C4 on C5, likely degenerative in nature. No definitive fracture seen. 2. Multilevel degenerative changes Electronically Signed   By: Donavan Foil M.D.   On: 05/09/2021 17:02   DG Hand Complete Right  Result Date: 05/09/2021 CLINICAL DATA:  Fall EXAM: RIGHT HAND - COMPLETE 3+ VIEW COMPARISON:  Wrist radiograph 02/16/2016 FINDINGS: No fracture or malalignment. Advanced degenerative changes at the first [facility] joint. Advanced arthritis at the IP joints with swelling. IMPRESSION: Arthritis.  No acute osseous abnormality Electronically Signed   By: Donavan Foil M.D.   On: 05/09/2021 17:07   CT Maxillofacial Wo Contrast  Result Date: 05/09/2021 CLINICAL DATA:  Facial trauma EXAM: CT MAXILLOFACIAL WITHOUT CONTRAST TECHNIQUE: Multidetector CT imaging of the maxillofacial structures was performed. Multiplanar CT image reconstructions were also generated. COMPARISON:  None. FINDINGS: Osseous: Mastoid air cells are clear. Mandibular heads are normally position. No mandibular fracture. Pterygoid plates and zygomatic arches are intact. No nasal bone fracture Orbits: Negative. No traumatic or inflammatory finding. Sinuses: Clear. Soft tissues: Moderate right forehead scalp hematoma Limited intracranial: No significant or unexpected finding. IMPRESSION: 1. No acute facial bone fracture identified 2. Moderate right forehead scalp hematoma Electronically Signed   By: Donavan Foil M.D.   On: 05/09/2021 17:06    Procedures Procedures   Medications Ordered in ED Medications  Tdap (BOOSTRIX) injection 0.5 mL (has no administration in time range)    ED Course  I have reviewed  the triage vital signs and the nursing notes.  Pertinent labs & imaging results that were available during my care of the patient were reviewed by me and considered in my medical decision making (see chart for details).    MDM Rules/Calculators/A&P                            ELYNA PANGILINAN is a 87 y.o. female with a past medical history significant for hypertension, hyperlipidemia, GERD, pacemaker, previous lumbar injury, and hypothyroidism who presents with a mechanical fall and head injury.  Patient reports that she was leaving the grocery store and got tripped up and had a fall hitting her forehead, face, and right hand on the ground.  She did not lose consciousness and denied any preceding symptoms.  She reports is been feeling very well before this fall.  She is reporting some mild to moderate headache and reports some bleeding.  She is unsure of her last tetanus shot.  She denies any pain in her chest, abdomen, pelvis, or lower extremities.  She is right-handed.  On exam, patient does have a abrasion overlying a hematoma on her right upper forehead.  She has a large abrasion to her right cheek and she has an abrasion to her face just below the nose.  No laceration seen.  Pupils are symmetric and reactive normal extraocular movements.  No stridor.  Symmetric smile.  Normal neuro exam with normal sensation and strength in all extremities.  No tenderness in the chest.  She does have a small area of redness on her anterior lower neck which she reports is not new and she is having no pain there.  No tenderness in the chest or back.  Lungs clear.  Abdomen nontender.  Patient does have some tenderness near the right anatomic snuffbox but now she is reporting it is not hurting and there is no overlying erythema.  No laceration seen in the hands.  Normal grip strength, sensation, strength, and pulses.  Patient otherwise resting comfortably.  Pupils are symmetric and reactive normal extraocular movements.  Will get CT of the head, neck, and face to look for injuries as well as an x-ray of the right hand.  Given her lack of preceding symptoms and the mechanical nature of this fall, we agreed to hold on other work-up at this time.  Anticipate cleaning and dressing the wounds  but as there do not appear to be any wounds needing repair, do not feel he needs stitches.  Anticipate reassessment and discharge if work-up is reassuring.  5:54 PM CT imaging shows no acute fracture or dislocation.  She does have anterolisthesis that is likely old and degenerative and C4 on C5.  Patient reports he thinks that has been there for a long time.  She is denying any new complaints.  On reassessment she does not have any snuffbox tenderness on the right hand and thinks that is more generalized soreness.  We discussed using a wrist splint but as she is feeling better she did not want it.  Patient will have her abrasions and wounds dressed with bacitracin and bandages and she will be stable for discharge home.  She understands return precautions and to follow-up with her PCP.  She no other questions or concerns and was discharged in good condition.    Final Clinical Impression(s) / ED Diagnoses Final diagnoses:  Fall, initial encounter  Hematoma and contusion  Abrasions of multiple sites  Rx / DC Orders ED Discharge Orders     None       Clinical Impression: 1. Fall, initial encounter   2. Hematoma and contusion   3. Abrasions of multiple sites     Disposition: Discharge  Condition: Good  I have discussed the results, Dx and Tx plan with the pt(& family if present). He/she/they expressed understanding and agree(s) with the plan. Discharge instructions discussed at great length. Strict return precautions discussed and pt &/or family have verbalized understanding of the instructions. No further questions at time of discharge.    New Prescriptions   No medications on file    Follow Up: Virgie Dad, MD [place] [id number] [phone]     [facility] [place] [id number] [phone]       Shamona Wirtz, Gwenyth Allegra, MD 05/09/21 1759

## 2021-05-09 NOTE — ED Triage Notes (Addendum)
Pt presents to ED BIB [facility]. Pt c/o lac and pain to head. Also c/o pain to L thumb. Pt reports that she was walking w/ cane and lost balance. Swelling to R collar bone. No blood thinners, no LOC. A&O x4

## 2021-05-09 NOTE — ED Notes (Signed)
Wounds cleaned, bacitracin applied and dressing to forehead.

## 2021-05-09 NOTE — ED Notes (Signed)
Patient transported to CT 

## 2021-05-09 NOTE — ED Notes (Addendum)
Pt ambulated to bathroom with 1 assist.

## 2021-05-09 NOTE — ED Notes (Signed)
Wounds to face cleaned with NS, Hematoma with superficial abrasions to rt forehead. Abrasion under rt eye surrounded with superficial abrasions. One small lac above rt side of lip, edges intact, no bleeding.

## 2021-05-09 NOTE — Discharge Instructions (Signed)
Your history, exam, work-up today are consistent with abrasions, contusions, and hematoma related to the fall but no evidence of acute bony injury, fractures, or internal bleeding.  The CT imaging was overall reassuring and just showed some of the chronic changes with degenerative disease we discussed.  The x-ray of the hand not show any evidence of bony injury and on reassessment your discomfort has improved.  Please follow-up with your primary doctor and watch for signs and symptoms of infection on the scrapes.  We updated your tetanus and there did not appear to be any wounds that required stitches at this time.  Please rest and stay hydrated.  If any symptoms change acutely, please return to the nearest emergency department.

## 2021-05-31 ENCOUNTER — Ambulatory Visit: Payer: Medicare Other

## 2021-06-02 ENCOUNTER — Ambulatory Visit (INDEPENDENT_AMBULATORY_CARE_PROVIDER_SITE_OTHER): Payer: Medicare Other | Admitting: *Deleted

## 2021-06-02 ENCOUNTER — Other Ambulatory Visit: Payer: Self-pay

## 2021-06-02 DIAGNOSIS — M810 Age-related osteoporosis without current pathological fracture: Secondary | ICD-10-CM | POA: Diagnosis not present

## 2021-06-02 MED ORDER — DENOSUMAB 60 MG/ML ~~LOC~~ SOSY
60.0000 mg | PREFILLED_SYRINGE | Freq: Once | SUBCUTANEOUS | Status: AC
  Administered 2021-06-02: 60 mg via SUBCUTANEOUS

## 2021-06-30 DIAGNOSIS — D225 Melanocytic nevi of trunk: Secondary | ICD-10-CM | POA: Diagnosis not present

## 2021-06-30 DIAGNOSIS — D2230 Melanocytic nevi of unspecified part of face: Secondary | ICD-10-CM | POA: Diagnosis not present

## 2021-06-30 DIAGNOSIS — L309 Dermatitis, unspecified: Secondary | ICD-10-CM | POA: Diagnosis not present

## 2021-06-30 DIAGNOSIS — Z23 Encounter for immunization: Secondary | ICD-10-CM | POA: Diagnosis not present

## 2021-06-30 DIAGNOSIS — L570 Actinic keratosis: Secondary | ICD-10-CM | POA: Diagnosis not present

## 2021-06-30 DIAGNOSIS — L578 Other skin changes due to chronic exposure to nonionizing radiation: Secondary | ICD-10-CM | POA: Diagnosis not present

## 2021-06-30 DIAGNOSIS — Z85828 Personal history of other malignant neoplasm of skin: Secondary | ICD-10-CM | POA: Diagnosis not present

## 2021-06-30 DIAGNOSIS — L821 Other seborrheic keratosis: Secondary | ICD-10-CM | POA: Diagnosis not present

## 2021-07-06 ENCOUNTER — Other Ambulatory Visit: Payer: Self-pay | Admitting: Adult Health

## 2021-07-07 ENCOUNTER — Ambulatory Visit (INDEPENDENT_AMBULATORY_CARE_PROVIDER_SITE_OTHER): Payer: Medicare Other

## 2021-07-07 DIAGNOSIS — I441 Atrioventricular block, second degree: Secondary | ICD-10-CM

## 2021-07-07 LAB — CUP PACEART REMOTE DEVICE CHECK
Battery Remaining Longevity: 65 mo
Battery Voltage: 2.99 V
Brady Statistic AP VP Percent: 9.17 %
Brady Statistic AP VS Percent: 7.64 %
Brady Statistic AS VP Percent: 64.12 %
Brady Statistic AS VS Percent: 19.07 %
Brady Statistic RA Percent Paced: 16.7 %
Brady Statistic RV Percent Paced: 73.13 %
Date Time Interrogation Session: 20221109090905
Implantable Lead Implant Date: 20171219
Implantable Lead Implant Date: 20171219
Implantable Lead Location: 753859
Implantable Lead Location: 753860
Implantable Lead Model: 5076
Implantable Lead Model: 5076
Implantable Pulse Generator Implant Date: 20171219
Lead Channel Impedance Value: 1045 Ohm
Lead Channel Impedance Value: 304 Ohm
Lead Channel Impedance Value: 342 Ohm
Lead Channel Impedance Value: 969 Ohm
Lead Channel Pacing Threshold Amplitude: 0.625 V
Lead Channel Pacing Threshold Amplitude: 1.875 V
Lead Channel Pacing Threshold Pulse Width: 0.4 ms
Lead Channel Pacing Threshold Pulse Width: 0.4 ms
Lead Channel Sensing Intrinsic Amplitude: 3 mV
Lead Channel Sensing Intrinsic Amplitude: 3 mV
Lead Channel Sensing Intrinsic Amplitude: 6.625 mV
Lead Channel Sensing Intrinsic Amplitude: 6.625 mV
Lead Channel Setting Pacing Amplitude: 2 V
Lead Channel Setting Pacing Amplitude: 3.75 V
Lead Channel Setting Pacing Pulse Width: 0.4 ms
Lead Channel Setting Sensing Sensitivity: 2.8 mV

## 2021-07-15 NOTE — Progress Notes (Signed)
Remote pacemaker transmission.   

## 2021-07-16 ENCOUNTER — Encounter: Payer: Medicare Other | Admitting: Family

## 2021-07-19 ENCOUNTER — Other Ambulatory Visit: Payer: Self-pay | Admitting: Internal Medicine

## 2021-07-20 DIAGNOSIS — I10 Essential (primary) hypertension: Secondary | ICD-10-CM | POA: Diagnosis not present

## 2021-07-20 LAB — BASIC METABOLIC PANEL
BUN: 13 (ref 4–21)
CO2: 25 — AB (ref 13–22)
Chloride: 109 — AB (ref 99–108)
Creatinine: 0.5 (ref 0.5–1.1)
Glucose: 107
Potassium: 3.8 (ref 3.4–5.3)
Sodium: 143 (ref 137–147)

## 2021-07-20 LAB — COMPREHENSIVE METABOLIC PANEL: Calcium: 10 (ref 8.7–10.7)

## 2021-07-26 ENCOUNTER — Encounter: Payer: Self-pay | Admitting: Adult Health

## 2021-07-26 ENCOUNTER — Non-Acute Institutional Stay: Payer: Medicare Other | Admitting: Adult Health

## 2021-07-26 ENCOUNTER — Other Ambulatory Visit: Payer: Self-pay

## 2021-07-26 VITALS — BP 146/74 | HR 76 | Temp 97.9°F | Ht 63.0 in | Wt 131.6 lb

## 2021-07-26 DIAGNOSIS — I10 Essential (primary) hypertension: Secondary | ICD-10-CM | POA: Diagnosis not present

## 2021-07-26 DIAGNOSIS — E034 Atrophy of thyroid (acquired): Secondary | ICD-10-CM | POA: Diagnosis not present

## 2021-07-26 DIAGNOSIS — M810 Age-related osteoporosis without current pathological fracture: Secondary | ICD-10-CM | POA: Diagnosis not present

## 2021-07-26 DIAGNOSIS — R739 Hyperglycemia, unspecified: Secondary | ICD-10-CM

## 2021-07-26 DIAGNOSIS — I872 Venous insufficiency (chronic) (peripheral): Secondary | ICD-10-CM

## 2021-07-26 DIAGNOSIS — K219 Gastro-esophageal reflux disease without esophagitis: Secondary | ICD-10-CM | POA: Diagnosis not present

## 2021-07-26 DIAGNOSIS — R001 Bradycardia, unspecified: Secondary | ICD-10-CM

## 2021-07-26 NOTE — Progress Notes (Signed)
Location: [facility]    POS: clinic  Provider:  Cindi Carbon, ANP [facility] [phone]   Goals of Care:  Advanced Directives 05/09/2021  Does Patient Have a Medical Advance Directive? Yes  Type of Advance Directive Living will  Does patient want to make changes to medical advance directive? -  Copy of [place] in Chart? -  Pre-existing out of facility DNR order (yellow form or pink MOST form) -     Chief Complaint  Patient presents with   Medical Management of Chronic Issues    Patient returns to the clinic for follow up. CC: Veins in right leg painful at night.    HPI: Patient is a 87 y.o. female seen today for medical management of chronic diseases.  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   [place] (tripped at the store) in Sept and hit her head and suffered a hematoma, went to the ED. CT was reassuring. No falls since hen.   Gets constipated sometimes. Tries to eat a lot of fruit.   She takes gemtasa in the morning and it helps with OAB  BP 146/74  Has edema in her right leg and wears compression socks from the drug store. She says Dr Lyndel Safe wanted to refer to her to a vein specialist . Reports she has pain in her leg at night where the large veins are located. No sob, chest pain, calf pain.   No longer going to exercise class due to prior compression fx and scoliosis.   Continues to drive but very little.   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 Cristina Gong   High cholesterol    HTN (hypertension) 07/17/2015   Echo 08/2019: EF 50-55,  grade 1 diastolic dysfunction, mild apical HK, normal RV SF, trivial pericardial effusion, moderate MAC, mild MR, mild TR   Hypothyroidism    Insomnia    Lactose intolerance    Lumbar degenerative disc disease    Myoview    Myoview 06/2019: EF 48, normal perfusion; low risk (EF normal by echo 10/2018)   Neuromuscular disorder (HCC)    Pronated ankles /Feet    Onychomycosis    Osteoarthritis    Osteoporosis    Second degree Mobitz II AV block    s/p PPM   Spinal stenosis    Thyroid disease    Varicose veins     Past Surgical History:  Procedure Laterality Date   ABDOMINAL HYSTERECTOMY     BLADDER REPAIR     blephoplasty bilateral  03/10/2014   CATARACT EXTRACTION W/ INTRAOCULAR LENS  IMPLANT, BILATERAL  7 & 8 2014   CHOLECYSTECTOMY     COLONOSCOPY  05/18/2012   EP IMPLANTABLE DEVICE N/A 08/16/2016   MDT [employer] MRI conditional PPM implanted by Dr Rayann Heman for mobitz II second degree AV block   EYE SURGERY Right 05/02/2018   laser eye surgery   herniated disc repair     INCONTINENCE SURGERY     JOINT REPLACEMENT  LAMINECTOMY  02/2011   L3-L5  Dr. Ellene Route   LUMBAR LAMINECTOMY/DECOMPRESSION MICRODISCECTOMY Right 09/21/2015   Procedure: Right Lumbar Three-FourMicrodiskectomy;  Surgeon: Kristeen Miss, MD;  Location: [facility];  Service: Neurosurgery;  Laterality: Right;  Right L3-4 Microdiskectomy   SHOULDER ARTHROSCOPY WITH ROTATOR CUFF REPAIR Right 08/2003   TONSILLECTOMY     TOTAL HIP ARTHROPLASTY     right 1999    Allergies  Allergen Reactions   Codeine Nausea Only   Macrobid [Nitrofurantoin] Diarrhea   Miralax [Polyethylene Glycol] Other (See Comments)    cramping   Other Nausea And Vomiting and Other (See Comments)   Pain Relief [Acetaminophen] Nausea And Vomiting    Patient states she is not allergic    Paxil [Paroxetine Hcl] Nausea Only   Percodan [Oxycodone-Aspirin] Itching   Oxycodone Nausea Only   Oxycodone-Acetaminophen Nausea Only   Penicillins Rash    Has  patient had a PCN reaction causing immediate rash, facial/tongue/throat swelling, SOB or lightheadedness with hypotension: YES Has patient had a PCN reaction causing severe rash involving mucus membranes or skin necrosis: YES Has patient had a PCN reaction that required hospitalization NO Has patient had a PCN reaction occurring within the last 10 years: NO If all of the above answers are "NO", then may proceed with Cephalosporin use.    Outpatient Encounter Medications as of 07/26/2021  Medication Sig   Calcium-Phosphorus-Vitamin D [phone] MG-MG-UNIT CHEW Chew 2 tablets by mouth daily.    Cyanocobalamin (VITAMIN B 12 PO) Take 1 tablet by mouth daily.   diltiazem (CARDIZEM CD) 240 MG 24 hr capsule TAKE ONE CAPSULE EVERY DAY   dorzolamide-timolol (COSOPT) 22.3-6.8 MG/ML ophthalmic solution Place 1 drop into both eyes 2 (two) times daily.   furosemide (LASIX) 20 MG tablet TAKE ONE TABLET BY MOUTH EVERY OTHER DAY.   ipratropium (ATROVENT) 0.06 % nasal spray USE 2 SPRAYS IN EACH NOSTRIL 3 TIMES A DAY AS NEEDED.   irbesartan (AVAPRO) 300 MG tablet Take 1 tablet (300 mg total) by mouth daily.   LACTASE PO Take 1 tablet by mouth as needed (when eating dairy products). Take as directed   loratadine (CLARITIN) 10 MG tablet Take 1 tablet (10 mg total) by mouth daily.   LUMIGAN 0.01 % SOLN Place 1 drop into both eyes 2 (two) times daily.    metoprolol succinate (TOPROL XL) 25 MG 24 hr tablet Take 0.5 tablets (12.5 mg total) by mouth at bedtime.   Multiple Vitamins-Minerals (MULTIVITAMIN WITH MINERALS) tablet Take 1 tablet by mouth daily.   Netarsudil Dimesylate 0.02 % SOLN Apply 1 drop to eye at bedtime. Right eye   Probiotic Product (ALIGN) 4 MG CAPS Take by mouth daily.   rosuvastatin (CRESTOR) 5 MG tablet TAKE 1 TABLET ONCE DAILY FOR CHOLESTEROL.   SYNTHROID 125 MCG tablet TAKE 1 TABLET ONCE DAILY.   UNABLE TO FIND Take by mouth as needed. Med Name: OTC purified peppermint oil for IBS - 3 caps  before lunch and dinner   UNABLE TO FIND Med Name:Hemeopathic ear drops  2 drops twice daily   Vibegron (GEMTESA) 75 MG TABS Take 1 tablet by mouth daily.   No facility-administered encounter medications on file as of 07/26/2021.    Review of Systems:  Review of Systems  Constitutional:  Negative for activity change, appetite change, chills, diaphoresis, fatigue, fever and unexpected weight change.  HENT:  Negative for congestion.   Respiratory:  Negative for cough, shortness of breath and wheezing.   Cardiovascular:  Positive for leg swelling. Negative for chest pain and palpitations.  Gastrointestinal:  Negative for abdominal distention, abdominal pain, constipation and diarrhea.  Genitourinary:  Negative for difficulty urinating and dysuria.  Musculoskeletal:  Positive for gait problem. Negative for arthralgias, back pain, joint swelling and myalgias.  Neurological:  Negative for dizziness, tremors, seizures, syncope, facial asymmetry, speech difficulty, weakness, light-headedness, numbness and headaches.  Psychiatric/Behavioral:  Negative for agitation, behavioral problems and confusion.    Health Maintenance  Topic Date Due   Pneumonia Vaccine 81+ Years old (2 - PCV) 07/15/2015   COVID-19 Vaccine (4 - Booster for Moderna series) 09/05/2020   INFLUENZA VACCINE  03/29/2021   TETANUS/TDAP  05/10/2031   DEXA SCAN  Completed   Zoster Vaccines- Shingrix  Completed   HPV VACCINES  Aged Out    Physical Exam: Vitals:   07/26/21 1335  BP: (!) 146/74  Pulse: 76  Temp: 97.9 F (36.6 C)  SpO2: 96%  Weight: 131 lb 9.6 oz (59.7 kg)  Height: _0  (1.6 m)   Body mass index is 23.31 kg/m. Wt Readings from Last 3 Encounters:  07/26/21 131 lb 9.6 oz (59.7 kg)  04/26/21 136 lb 3.2 oz (61.8 kg)  03/23/21 135 lb 6.4 oz (61.4 kg)    Physical Exam Vitals and nursing note reviewed.  Constitutional:      General: She is not in acute distress.    Appearance: She is not diaphoretic.   HENT:     Head: Normocephalic and atraumatic.     Right Ear: Tympanic membrane normal.     Left Ear: Tympanic membrane normal.     Nose: Nose normal. No congestion.     Mouth/Throat:     Mouth: Mucous membranes are moist.     Pharynx: Oropharynx is clear.  Eyes:     Conjunctiva/sclera: Conjunctivae normal.     Pupils: Pupils are equal, round, and reactive to light.  Neck:     Vascular: No JVD.  Cardiovascular:     Rate and Rhythm: Normal rate and regular rhythm.     Heart sounds: No murmur heard. Pulmonary:     Effort: Pulmonary effort is normal. No respiratory distress.     Breath sounds: Normal breath sounds. No wheezing.  Abdominal:     General: Bowel sounds are normal. There is no distension.     Palpations: Abdomen is soft.     Tenderness: There is no abdominal tenderness.  Musculoskeletal:     Right lower leg: Edema (+1 chronic) present.     Left lower leg: No edema.     Comments: No erythema or tenderness to the RLE Varicose veins noted to RLE  Skin:    General: Skin is warm and dry.  Neurological:     Mental Status: She is alert and oriented to person, place, and time.    Labs reviewed: Basic Metabolic Panel: Recent Labs    03/11/21 0000  NA 141  K 4.1  CL 105  CO2 23*  BUN 12  CREATININE 0.5  CALCIUM 10.0   Liver Function Tests: No results for input(s): AST, ALT, ALKPHOS, BILITOT, PROT, ALBUMIN in the last 8760 hours. No results for input(s): LIPASE, AMYLASE in the last 8760 hours. No results for input(s): AMMONIA in the last 8760 hours. CBC: Recent Labs    03/11/21 0000  WBC 5.5  HGB 13.0  HCT 39  PLT 211   Lipid Panel: No results for input(s): CHOL, HDL, LDLCALC, TRIG, CHOLHDL, LDLDIRECT in the last  8760 hours. Lab Results  Component Value Date   HGBA1C 5.8 03/11/2021    Procedures since last visit: CUP Dexter  Result Date: 07/07/2021 Scheduled remote reviewed. Normal device function.  RV lead impedance and RV  threshold have been rising the past 9  months.  NSVT arrhythmia greater than 20 beats, sent to the triage.  Next remote 91 days. Kathy Breach, RN, CCDS, [employer]   Assessment/Plan  1. Venous insufficiency of both lower extremities Recommend compression hose, elevation Continue lasix 20 mg qod   - Ambulatory referral to Vascular Surgery due to varicose veins with discomfort  2. Primary hypertension Controlled Goal <150/90 Continue Cardizem, Avapro, toprol  3. Hypothyroidism due to acquired atrophy of thyroid Lab Results  Component Value Date   TSH 0.93 06/25/2020   Continue Synthroid  4. Senile osteoporosis Continues on prolia T score -1.0 in 2019 Prior hx of compression fracture and continues to wear brace   5. Bradycardia S/p pacemaker Remote transmission complete. 07/07/21  6. Gastroesophageal reflux disease without esophagitis No current symptoms Hx of hiatal hernia  7. Hyperglycemia Fasting glucose mildly elevated 107 Continue to monitor    Labs/tests ordered:  * No order type specified * CBC BMP Lipid TSH A1C Next appt:  08/09/2021  Total time 50mn:  time greater than 50% of total time spent doing pt counseling and coordination of care

## 2021-08-09 ENCOUNTER — Ambulatory Visit (INDEPENDENT_AMBULATORY_CARE_PROVIDER_SITE_OTHER): Payer: Medicare Other | Admitting: Nurse Practitioner

## 2021-08-09 ENCOUNTER — Other Ambulatory Visit: Payer: Self-pay

## 2021-08-09 ENCOUNTER — Encounter: Payer: Self-pay | Admitting: Nurse Practitioner

## 2021-08-09 DIAGNOSIS — Z66 Do not resuscitate: Secondary | ICD-10-CM

## 2021-08-09 DIAGNOSIS — Z0000 Encounter for general adult medical examination without abnormal findings: Secondary | ICD-10-CM | POA: Diagnosis not present

## 2021-08-09 NOTE — Patient Instructions (Signed)
Destiny [name] , Thank you for taking time to come for your Medicare Wellness Visit. I appreciate your ongoing commitment to your health goals. Please review the following plan we discussed and let me know if I can assist you in the future.   Screening recommendations/referrals: Colonoscopy aged out Mammogram aged out Bone Density declined  Recommended yearly ophthalmology/optometry visit for glaucoma screening and checkup Recommended yearly dental visit for hygiene and checkup  Vaccinations: Influenza vaccine recommended- to make appt with local pharmacy Pneumococcal vaccine up to date Tdap vaccine up to date Shingles vaccine up to date   COVID- recommended- to make appt with local pharmacy   Advanced directives: on file   Conditions/risks identified: advance age, fall risk  Next appointment: yearly    Preventive Care 85 Years and Older, Female Preventive care refers to lifestyle choices and visits with your health care provider that can promote health and wellness. What does preventive care include? A yearly physical exam. This is also called an annual well check. Dental exams once or twice a year. Routine eye exams. Ask your health care provider how often you should have your eyes checked. Personal lifestyle choices, including: Daily care of your teeth and gums. Regular physical activity. Eating a healthy diet. Avoiding tobacco and drug use. Limiting alcohol use. Practicing safe sex. Taking low-dose aspirin every day. Taking vitamin and mineral supplements as recommended by your health care provider. What happens during an annual well check? The services and screenings done by your health care provider during your annual well check will depend on your age, overall health, lifestyle risk factors, and family history of disease. Counseling  Your health care provider may ask you questions about your: Alcohol use. Tobacco use. Drug use. Emotional well-being. Home and  relationship well-being. Sexual activity. Eating habits. History of falls. Memory and ability to understand (cognition). Work and work Statistician. Reproductive health. Screening  You may have the following tests or measurements: Height, weight, and BMI. Blood pressure. Lipid and cholesterol levels. These may be checked every 5 years, or more frequently if you are over 40 years old. Skin check. Lung cancer screening. You may have this screening every year starting at age 85 if you have a 30-pack-year history of smoking and currently smoke or have quit within the past 15 years. Fecal occult blood test (FOBT) of the stool. You may have this test every year starting at age 85. Flexible sigmoidoscopy or colonoscopy. You may have a sigmoidoscopy every 5 years or a colonoscopy every 10 years starting at age 85. Hepatitis C blood test. Hepatitis B blood test. Sexually transmitted disease (STD) testing. Diabetes screening. This is done by checking your blood sugar (glucose) after you have not eaten for a while (fasting). You may have this done every 1-3 years. Bone density scan. This is done to screen for osteoporosis. You may have this done starting at age 45. Mammogram. This may be done every 1-2 years. Talk to your health care provider about how often you should have regular mammograms. Talk with your health care provider about your test results, treatment options, and if necessary, the need for more tests. Vaccines  Your health care provider may recommend certain vaccines, such as: Influenza vaccine. This is recommended every year. Tetanus, diphtheria, and acellular pertussis (Tdap, Td) vaccine. You may need a Td booster every 10 years. Zoster vaccine. You may need this after age 31. Pneumococcal 13-valent conjugate (PCV13) vaccine. One dose is recommended after age 37. Pneumococcal polysaccharide (PPSV23) vaccine.  One dose is recommended after age 88. Talk to your health care provider  about which screenings and vaccines you need and how often you need them. This information is not intended to replace advice given to you by your health care provider. Make sure you discuss any questions you have with your health care provider. Document Released: 09/11/2015 Document Revised: 05/04/2016 Document Reviewed: 06/16/2015 Elsevier Interactive Patient Education  2017 [place] Prevention in the Home Falls can cause injuries. They can happen to people of all ages. There are many things you can do to make your home safe and to help prevent falls. What can I do on the outside of my home? Regularly fix the edges of walkways and driveways and fix any cracks. Remove anything that might make you trip as you walk through a door, such as a raised step or threshold. Trim any bushes or trees on the path to your home. Use bright outdoor lighting. Clear any walking paths of anything that might make someone trip, such as rocks or tools. Regularly check to see if handrails are loose or broken. Make sure that both sides of any steps have handrails. Any raised decks and porches should have guardrails on the edges. Have any leaves, snow, or ice cleared regularly. Use sand or salt on walking paths during winter. Clean up any spills in your garage right away. This includes oil or grease spills. What can I do in the bathroom? Use night lights. Install grab bars by the toilet and in the tub and shower. Do not use towel bars as grab bars. Use non-skid mats or decals in the tub or shower. If you need to sit down in the shower, use a plastic, non-slip stool. Keep the floor dry. Clean up any water that spills on the floor as soon as it happens. Remove soap buildup in the tub or shower regularly. Attach bath mats securely with double-sided non-slip rug tape. Do not have throw rugs and other things on the floor that can make you trip. What can I do in the bedroom? Use night lights. Make sure  that you have a light by your bed that is easy to reach. Do not use any sheets or blankets that are too big for your bed. They should not hang down onto the floor. Have a firm chair that has side arms. You can use this for support while you get dressed. Do not have throw rugs and other things on the floor that can make you trip. What can I do in the kitchen? Clean up any spills right away. Avoid walking on wet floors. Keep items that you use a lot in easy-to-reach places. If you need to reach something above you, use a strong step stool that has a grab bar. Keep electrical cords out of the way. Do not use floor polish or wax that makes floors slippery. If you must use wax, use non-skid floor wax. Do not have throw rugs and other things on the floor that can make you trip. What can I do with my stairs? Do not leave any items on the stairs. Make sure that there are handrails on both sides of the stairs and use them. Fix handrails that are broken or loose. Make sure that handrails are as long as the stairways. Check any carpeting to make sure that it is firmly attached to the stairs. Fix any carpet that is loose or worn. Avoid having throw rugs at the top or bottom of  the stairs. If you do have throw rugs, attach them to the floor with carpet tape. Make sure that you have a light switch at the top of the stairs and the bottom of the stairs. If you do not have them, ask someone to add them for you. What else can I do to help prevent falls? Wear shoes that: Do not have high heels. Have rubber bottoms. Are comfortable and fit you well. Are closed at the toe. Do not wear sandals. If you use a stepladder: Make sure that it is fully opened. Do not climb a closed stepladder. Make sure that both sides of the stepladder are locked into place. Ask someone to hold it for you, if possible. Clearly mark and make sure that you can see: Any grab bars or handrails. First and last steps. Where the edge of  each step is. Use tools that help you move around (mobility aids) if they are needed. These include: Canes. Walkers. Scooters. Crutches. Turn on the lights when you go into a dark area. Replace any light bulbs as soon as they burn out. Set up your furniture so you have a clear path. Avoid moving your furniture around. If any of your floors are uneven, fix them. If there are any pets around you, be aware of where they are. Review your medicines with your doctor. Some medicines can make you feel dizzy. This can increase your chance of falling. Ask your doctor what other things that you can do to help prevent falls. This information is not intended to replace advice given to you by your health care provider. Make sure you discuss any questions you have with your health care provider. Document Released: 06/11/2009 Document Revised: 01/21/2016 Document Reviewed: 09/19/2014 Elsevier Interactive Patient Education  2017 [employer].

## 2021-08-09 NOTE — Progress Notes (Signed)
   This service is provided via telemedicine  No vital signs collected/recorded due to the encounter was a telemedicine visit.   Location of patient (ex: home, work):  Home  Patient consents to a telephone visit: Yes, see telephone visit dated 08/09/21  Location of the provider (ex: office, home):  [facility], Office   Name of any referring provider:  N/A  Names of all persons participating in the telemedicine service and their role in the encounter:  S.Chrae B/CMA, Sherrie Mustache, NP, and Patient   Time spent on call:  11 min with medical assistant

## 2021-08-09 NOTE — Progress Notes (Signed)
Subjective:   Destiny [name] is a 87 y.o. female who presents for Medicare Annual (Subsequent) preventive examination.  Review of Systems     Cardiac Risk Factors include: advanced age (>64mn, >>43women);dyslipidemia;hypertension;sedentary lifestyle     Objective:    There were no vitals filed for this visit. There is no height or weight on file to calculate BMI.  Advanced Directives 08/09/2021 05/09/2021 03/17/2021 03/03/2021 11/23/2020 11/11/2020 07/15/2020  Does Patient Have a Medical Advance Directive? Yes Yes Yes Yes No Yes Yes  Type of AParamedicof AVirgin[place] BeachLiving will;Out of facility DNR (pink MOST or yellow form) Living will HFruitdaleLiving will HRio PinarLiving will HDoniphanOut of facility DNR (pink MOST or yellow form) H[place] of facility DNR (pink MOST or yellow form) HPaisano ParkOut of facility DNR (pink MOST or yellow form)  Does patient want to make changes to medical advance directive? No - Patient declined - No - Patient declined - No - Patient declined No - Patient declined No - Patient declined  Copy of HBenton Cityin Chart? Yes - validated most recent copy scanned in chart (See row information) - (No Data) (No Data) - - Yes - validated most recent copy scanned in chart (See row information)  Pre-existing out of facility DNR order (yellow form or pink MOST form) - - - - - - -    Current Medications (verified) Outpatient Encounter Medications as of 08/09/2021  Medication Sig   Calcium-Phosphorus-Vitamin D 250-107-500 MG-MG-UNIT CHEW Chew 1 tablet by mouth daily.   Cyanocobalamin (VITAMIN B 12 PO) Take 1 tablet by mouth daily.   diltiazem (CARDIZEM CD) 240 MG 24 hr capsule TAKE ONE CAPSULE EVERY DAY   dorzolamide-timolol (COSOPT) 22.3-6.8 MG/ML ophthalmic solution Place 1 drop into both eyes 2 (two) times daily.   furosemide  (LASIX) 20 MG tablet TAKE ONE TABLET BY MOUTH EVERY OTHER DAY.   ipratropium (ATROVENT) 0.06 % nasal spray USE 2 SPRAYS IN EACH NOSTRIL 3 TIMES A DAY AS NEEDED.   irbesartan (AVAPRO) 300 MG tablet Take 1 tablet (300 mg total) by mouth daily.   LACTASE PO Take 1 tablet by mouth as needed (when eating dairy products). Take as directed   Loratadine 10 MG CAPS Take 1 capsule by mouth as needed.   LUMIGAN 0.01 % SOLN Place 1 drop into both eyes at bedtime.   metoprolol succinate (TOPROL XL) 25 MG 24 hr tablet Take 0.5 tablets (12.5 mg total) by mouth at bedtime.   Multiple Vitamins-Minerals (MULTIVITAMIN WITH MINERALS) tablet Take 1 tablet by mouth daily.   Netarsudil Dimesylate 0.02 % SOLN Apply 1 drop to eye at bedtime. Right eye   Probiotic Product (ALIGN) 4 MG CAPS Take by mouth daily.   rosuvastatin (CRESTOR) 5 MG tablet TAKE 1 TABLET ONCE DAILY FOR CHOLESTEROL.   SYNTHROID 125 MCG tablet TAKE 1 TABLET ONCE DAILY.   UNABLE TO FIND Take by mouth as needed. Med Name: OTC purified peppermint oil for IBS - 3 caps before lunch and dinner   UNABLE TO FIND Med Name:Hemeopathic ear drops  2 drops as needed   Vibegron (GEMTESA) 75 MG TABS Take 1 tablet by mouth daily.   [DISCONTINUED] loratadine (CLARITIN) 10 MG tablet Take 1 tablet (10 mg total) by mouth daily. (Patient not taking: Reported on 08/09/2021)   No facility-administered encounter medications on file as of 08/09/2021.    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 Cristina Gong   High cholesterol    HTN (hypertension) 07/17/2015   Echo 08/2019: EF 82-95, grade 1 diastolic dysfunction, mild apical HK, normal RV SF, trivial pericardial effusion, moderate MAC, mild MR, mild TR   Hypothyroidism    Insomnia    Lactose intolerance    Lumbar degenerative disc disease    Myoview    Myoview 06/2019: EF 48, normal perfusion; low risk (EF normal by echo 10/2018)   Neuromuscular disorder (HCC)    Pronated ankles /Feet    Onychomycosis    Osteoarthritis    Osteoporosis    Second degree Mobitz II AV block    s/p PPM   Spinal stenosis    Thyroid disease    Varicose veins    Past Surgical History:  Procedure Laterality Date   ABDOMINAL HYSTERECTOMY     BLADDER REPAIR     blephoplasty bilateral  03/10/2014   CATARACT EXTRACTION W/ INTRAOCULAR LENS  IMPLANT, BILATERAL  7 & 8 2014   CHOLECYSTECTOMY     COLONOSCOPY  05/18/2012   EP IMPLANTABLE DEVICE N/A 08/16/2016   MDT [employer]vis[employer] MRI conditional PPM implanted by Dr Rayann Heman for mobitz II second degree AV block   EYE SURGERY Right 05/02/2018   laser eye surgery   herniated disc repair     INCONTINENCE SURGERY     JOINT REPLACEMENT     LAMINECTOMY  02/2011   L3-L5  Dr. Ellene Route   LUMBAR LAMINECTOMY/DECOMPRESSION MICRODISCECTOMY Right 09/21/2015   Procedure: Right Lumbar Three-FourMicrodiskectomy;  Surgeon: Kristeen Miss, MD;  Location: [facility];  Service: Neurosurgery;  Laterality: Right;  Right L3-4 Microdiskectomy   SHOULDER ARTHROSCOPY WITH ROTATOR CUFF REPAIR Right 08/2003   TONSILLECTOMY     TOTAL HIP ARTHROPLASTY     right 1999   Family History  Problem Relation Age of Onset   Arthritis Mother    Osteoporosis Mother    Heart attack Father    Social History   Socioeconomic History   Marital status: Widowed    Spouse name: Not on file   Number of children: Not on file   Years of education: Not on file   Highest  education level: Not on file  Occupational History   Not on file  Tobacco Use   Smoking status: Never   Smokeless tobacco: Never  Vaping Use   Vaping Use: Never used  Substance and Sexual Activity   Alcohol use: No   Drug use: No   Sexual activity: Not Currently  Other Topics Concern   Not on file  Social History Narrative   Lives at [facility]   Caffeine 1-2 cups daily   Exercise water aerobics, walking   Never smoked   POA               Do you drink/ eat things with caffeine? Yes      Marital status:  Widow                           What year were you married ? 1957      Do you live in a house, apartment,assistred living, condo, trailer, etc.)?[place] [facility]      Is it one or more stories?       How many persons live in your home ? 1      Do you have any pets in your home ?(please list)0       Current or past profession: Social Worker      Do you exercise?  Yes                            Type & how often:  Water Aerobic, 3-4 hours per week       Do you have a living will? Yes      Do you have a DNR form? Yes                       If not, do you want to discuss one?       Do you have signed POA?HPOA forms?                 If so, please bring to your        appointment         Social Determinants of Health   Financial Resource Strain: Not on file  Food Insecurity: Not on file  Transportation Needs: Not on file  Physical Activity: Not on file  Stress: Not on file  Social Connections: Not on file    Tobacco Counseling Counseling given: Not Answered   Clinical Intake:  Pre-visit preparation completed: Yes  Pain : No/denies pain     BMI - recorded: 23 Nutritional Status: BMI of 19-24  Normal Nutritional Risks: Unintentional weight loss Diabetes: No     Diabetic?no    n     Activities of Daily Living In your present state of health, do you have any difficulty performing the following activities: 08/09/2021   Hearing? N  Vision? N  Difficulty concentrating or making decisions? N  Walking or climbing stairs? Y  Comment uses walker  Dressing or bathing? N  Doing errands, shopping? N  Preparing Food and eating ? N  Using the Toilet? N  In the past six months, have you accidently leaked urine? N  Do you have problems with loss of bowel control? N  Managing your Medications? N  Managing your Finances? N  Housekeeping or managing your Housekeeping? N  Some recent data might be hidden    Patient Care Team: Virgie Dad, MD as PCP - General (Internal Medicine) Thompson Grayer, MD as PCP - Electrophysiology (Cardiology) Deland Pretty, MD (Internal Medicine) Ronald Lobo, MD as Consulting Physician (Gastroenterology) Jari Pigg, MD as Consulting Physician (Dermatology) Shon Hough, MD as Consulting Physician (Ophthalmology) Kristeen Miss, MD as Consulting Physician (Neurosurgery) Justice Britain, MD as Consulting Physician (Orthopedic Surgery) Gaynelle Arabian, MD as Consulting Physician (Orthopedic Surgery) Suella Broad, MD as Consulting Physician (Physical Medicine and Rehabilitation)  Indicate any recent Medical Services you may have received from other than [facility] providers in the past year (date may be approximate).     Assessment:   This is a routine wellness examination for Drisana.  Hearing/Vision screen Hearing Screening - Comments:: No  hearing issues  Vision Screening - Comments:: Last eye exam, Dr[facility])   Dietary issues and exercise activities discussed: Current Exercise Habits: The patient does not participate in regular exercise at present   Goals Addressed   None    Depression Screen PHQ 2/9 Scores 08/09/2021 07/15/2020 06/30/2020 03/18/2020 10/30/2019 06/26/2019 05/22/2019  PHQ - 2 Score 0 0 0 0 0 0 0    Fall Risk Fall Risk  08/09/2021 07/26/2021 03/17/2021 07/15/2020 06/30/2020  Falls in the past year? 1 1 0 0 0  Number falls in past  yr: 0 0 0 0 0  Injury with Fall? 0 0 - 0 0  Risk for fall due to : No Fall Risks Impaired mobility - - -  Follow up Falls evaluation completed Falls evaluation completed Falls evaluation completed - -    FALL RISK PREVENTION PERTAINING TO THE HOME:  Any stairs in or around the home? No  If so, are there any without handrails? No  Home free of loose throw rugs in walkways, pet beds, electrical cords, etc? Yes  Adequate lighting in your home to reduce risk of falls? Yes   ASSISTIVE DEVICES UTILIZED TO PREVENT FALLS:  Life alert? No  Use of a cane, walker or w/c? Yes  Grab bars in the bathroom? Yes  Shower chair or bench in shower? Yes  Elevated toilet seat or a handicapped toilet? Yes   TIMED UP AND GO:  Was the test performed? No .    Cognitive Function: MMSE - Mini Mental State Exam 05/08/2018 03/28/2017  Orientation to time 5 5  Orientation to Place 5 5  Registration 3 3  Attention/ Calculation 5 5  Recall 3 3  Language- name 2 objects 2 2  Language- repeat 1 1  Language- follow 3 step command 3 3  Language- read & follow direction 1 1  Write a sentence 1 1  Copy design 1 1  Total score 30 30     6CIT Screen 08/09/2021 06/30/2020 06/26/2019  What Year? 0 points 0 points 0 points  What month? 0 points 0 points 0 points  What time? 0 points 0 points 0 points  Count back from 20 0 points 0 points 0 points  Months in reverse 0 points 0 points 0 points  Repeat phrase 0 points 0 points 0 points  Total Score 0 0 0    Immunizations Immunization History  Administered Date(s) Administered   DTaP 06/19/2012   Influenza, High Dose Seasonal PF 06/26/2020   Influenza,inj,Quad PF,6+ Mos 05/12/2016, 06/22/2018, 05/07/2019   Influenza-Unspecified 08/29/2014, 06/19/2017, 06/26/2020   Moderna Sars-Covid-2 Vaccination 09/07/2019, 10/10/2019, 07/11/2020   Pneumococcal Polysaccharide-23 12/11/1999   Pneumococcal-Unspecified 08/29/2013, 07/14/2014   Td 04/07/2010, 06/19/2012    Tdap 05/09/2021   Zoster Recombinat (Shingrix) 03/30/2017, 06/16/2017   Zoster, Live 06/24/2006    TDAP status: Up to date  Flu Vaccine status: Due, Education has been provided regarding the importance of this vaccine. Advised may receive this vaccine at local pharmacy or Health Dept. Aware to provide a copy of the vaccination record if obtained from local pharmacy or Health Dept. Verbalized acceptance and understanding.  Pneumococcal vaccine status: Up to date  Covid-19 vaccine status: Information provided on how to obtain vaccines.   Qualifies for Shingles Vaccine? Yes   Zostavax completed Yes   Shingrix Completed?: Yes  Screening Tests Health Maintenance  Topic Date Due   Pneumonia Vaccine 18+ Years old (2 - PCV) 07/15/2015   COVID-19 Vaccine (4 -  Booster for Moderna series) 09/05/2020   INFLUENZA VACCINE  03/29/2021   TETANUS/TDAP  05/10/2031   DEXA SCAN  Completed   Zoster Vaccines- Shingrix  Completed   HPV VACCINES  Aged Out    Health Maintenance  Health Maintenance Due  Topic Date Due   Pneumonia Vaccine 53+ Years old (2 - PCV) 07/15/2015   COVID-19 Vaccine (4 - Booster for Moderna series) 09/05/2020   INFLUENZA VACCINE  03/29/2021    Colorectal cancer screening: No longer required.   Mammogram status: No longer required due to age.  Decline bone density   Lung Cancer Screening: (Low Dose CT Chest recommended if Age 49-80 years, 30 pack-year currently smoking OR have quit w/in 15years.) does not qualify.   Lung Cancer Screening Referral: na  Additional Screening:  Hepatitis C Screening: does not qualify; Completed na  Vision Screening: Recommended annual ophthalmology exams for early detection of glaucoma and other disorders of the eye. Is the patient up to date with their annual eye exam?  Yes  Who is the provider or what is the name of the office in which the patient attends annual eye exams? [facility]  If pt is not established with a provider, would they  like to be referred to a provider to establish care? No .   Dental Screening: Recommended annual dental exams for proper oral hygiene  Community Resource Referral / Chronic Care Management: CRR required this visit?  No   CCM required this visit?  No      Plan:     I have personally reviewed and noted the following in the patient's chart:   Medical and social history Use of alcohol, tobacco or illicit drugs  Current medications and supplements including opioid prescriptions.  Functional ability and status Nutritional status Physical activity Advanced directives List of other physicians Hospitalizations, surgeries, and ER visits in previous 12 months Vitals Screenings to include cognitive, depression, and falls Referrals and appointments  In addition, I have reviewed and discussed with patient certain preventive protocols, quality metrics, and best practice recommendations. A written personalized care plan for preventive services as well as general preventive health recommendations were provided to patient.     Lauree Chandler, NP   08/09/2021    Virtual Visit via Telephone Note  I connected withNAME@ on 08/09/21 at  4:15 PM EST by telephone and verified that I am speaking with the correct person using two identifiers.  Location: Patient: home Provider: [facility]   I discussed the limitations, risks, security and privacy concerns of performing an evaluation and management service by telephone and the availability of in person appointments. I also discussed with the patient that there may be a patient responsible charge related to this service. The patient expressed understanding and agreed to proceed.   I discussed the assessment and treatment plan with the patient. The patient was provided an opportunity to ask questions and all were answered. The patient agreed with the plan and demonstrated an understanding of the instructions.   The patient was advised to call back or  seek an in-person evaluation if the symptoms worsen or if the condition fails to improve as anticipated.  I provided 16 minutes of non-face-to-face time during this encounter.  Carlos American. Harle Battiest Avs printed and mailed

## 2021-08-12 ENCOUNTER — Other Ambulatory Visit (HOSPITAL_BASED_OUTPATIENT_CLINIC_OR_DEPARTMENT_OTHER): Payer: Self-pay

## 2021-08-12 ENCOUNTER — Other Ambulatory Visit: Payer: Self-pay | Admitting: *Deleted

## 2021-08-12 DIAGNOSIS — Z23 Encounter for immunization: Secondary | ICD-10-CM | POA: Diagnosis not present

## 2021-08-12 MED ORDER — INFLUENZA VAC A&B SA ADJ QUAD 0.5 ML IM PRSY
PREFILLED_SYRINGE | INTRAMUSCULAR | 0 refills | Status: DC
  Filled 2021-08-12: qty 0.5, 1d supply, fill #0

## 2021-08-12 MED ORDER — METOPROLOL SUCCINATE ER 25 MG PO TB24: 12.5000 mg | ORAL_TABLET | Freq: Every day | ORAL | 5 refills | Status: DC

## 2021-08-13 ENCOUNTER — Other Ambulatory Visit (HOSPITAL_BASED_OUTPATIENT_CLINIC_OR_DEPARTMENT_OTHER): Payer: Self-pay

## 2021-08-16 IMAGING — CT CT L SPINE W/O CM
1 of 6 series · 6 of 14 positions shown, 8 images · non-contrast
Comparison: Radiographs dated 08/23/2018 and lumbar MRI dated
08/13/2015 and CT scan dated 01/19/2011

CLINICAL DATA: Low back pain and left leg pain since July 2019.

EXAM:
CT LUMBAR SPINE WITHOUT CONTRAST
TECHNIQUE: Multidetector CT imaging of the lumbar spine was performed without
intravenous contrast administration. Multiplanar CT image
reconstructions were also generated.

[Series 3: l spine soft · axial · 0.30mm/px · z∈[-276,-120]mm · 6 of 74 slices shown, 8 images]
[im 11/74  soft-tissue]
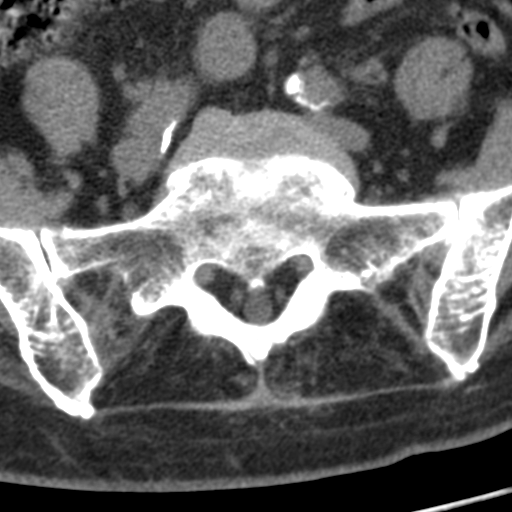
[im 11/74  bone]
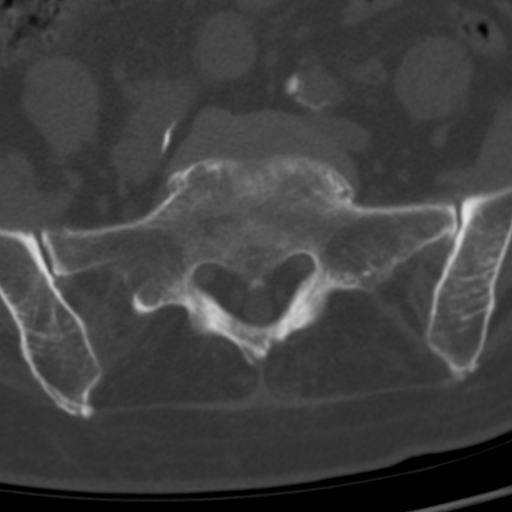
[im 21/74  bone]
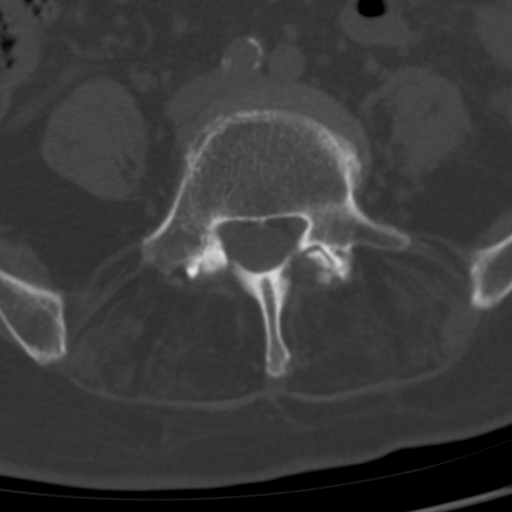
[im 32/74  bone]
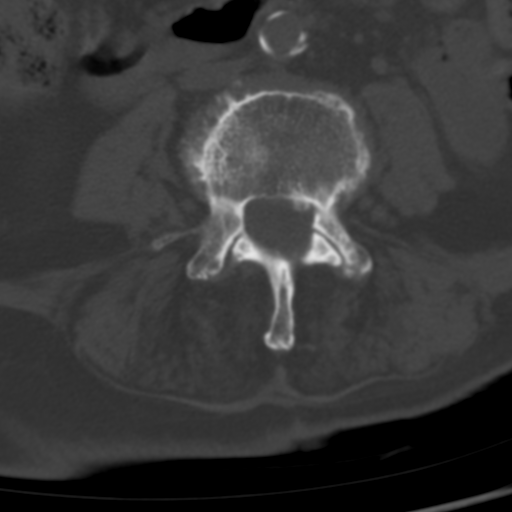
[im 42/74  bone]
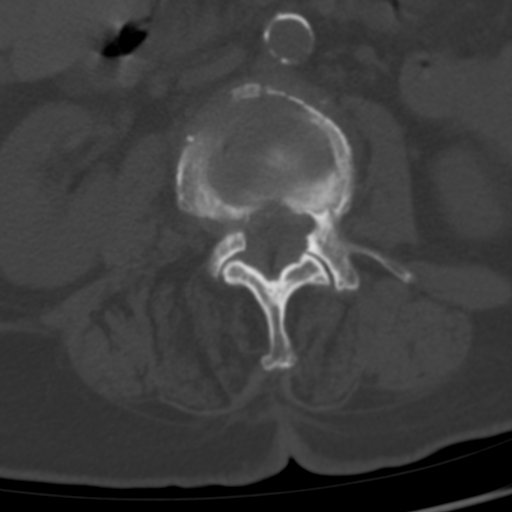
[im 53/74  soft-tissue]
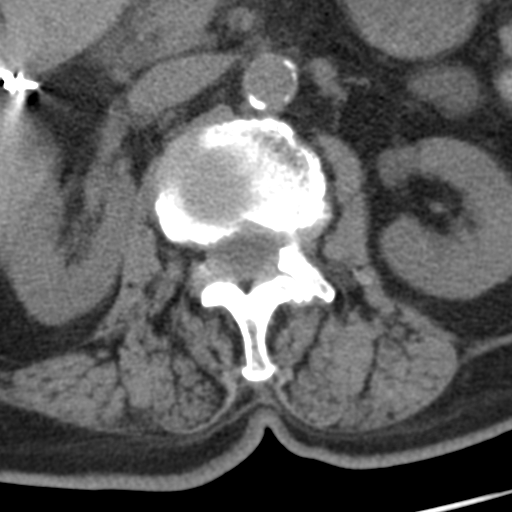
[im 53/74  bone]
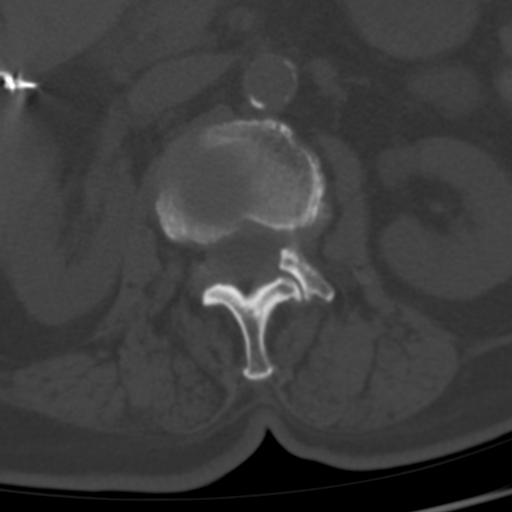
[im 63/74  bone]
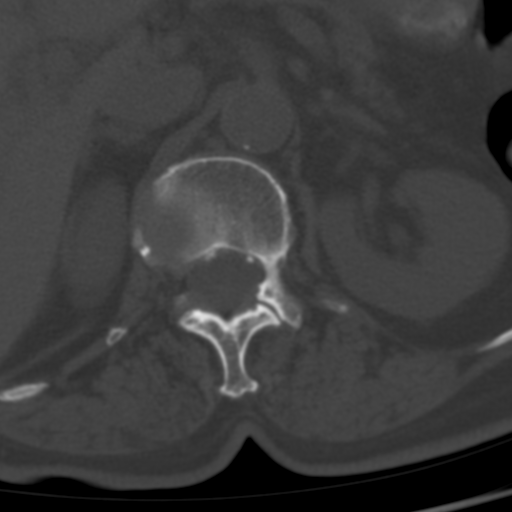

[6 of 14 positions shown; findings below may reference images not displayed]

FINDINGS: Segmentation: 5 lumbar type vertebrae.

Vertebrae: There is a new benign-appearing mild compression fracture
of the superior endplate of L3. No other acute abnormalities.

Paraspinal and other soft tissues: Aortic atherosclerosis. Otherwise
negative.

Disc levels: T12-L1: Negative.

L1-2: Small disc bulges into both neural foramina without neural
impingement. Otherwise negative.

L2-3: New benign-appearing mild compression fracture of the superior
aspect of L3. Small broad-based disc protrusion extending into both
neural foramina. Marked hypertrophy of the ligamentum flavum and
congenitally short pedicles combine to create severe spinal
stenosis, best seen on image 33 of series 3. No epidural hemorrhage.
No foraminal stenosis.

L3-4: 3 mm spondylolisthesis. Severe degenerative disc disease with
a vacuum phenomenon. Calcified disc extrusion into the right neural
foramen and right lateral recess which should affect the right L3
nerve. Calcified disc extrusion to the left of midline extending
inferiorly behind the body of L4 which could affect the left L4
nerve. Congenitally short pedicles and slight hypertrophy of the
ligamentum flavum and facet joints combine to create severe spinal
stenosis, best seen on image 40 of series 3.

L4-5: 6 mm spondylolisthesis. Broad-based protrusion of the
uncovered disc extending into both neural foramina. The right L4
nerve appears slightly compressed under the right pedicle by the
disc protrusion. Severe bilateral facet arthritis. Severe spinal
stenosis, best seen on image 49 of series 3.

L5-S1: Small broad-based disc bulge without neural impingement.
Moderate bilateral facet arthritis. No foraminal stenosis.
IMPRESSION: 1. Severe spinal stenosis at L2-3, L3-4, and L4-5.
2. Slight compression of the right L4 nerve under the right pedicle
by the disc protrusion at L4-5.
3. New benign-appearing mild compression fracture of the superior
endplate of L3.
4. Calcified disc extrusions at L3-4 could affect the right L3 nerve
in the left L4 nerve as described above.

## 2021-08-25 ENCOUNTER — Other Ambulatory Visit: Payer: Self-pay | Admitting: Internal Medicine

## 2021-08-25 ENCOUNTER — Other Ambulatory Visit: Payer: Self-pay

## 2021-08-25 DIAGNOSIS — I872 Venous insufficiency (chronic) (peripheral): Secondary | ICD-10-CM

## 2021-09-07 ENCOUNTER — Ambulatory Visit (INDEPENDENT_AMBULATORY_CARE_PROVIDER_SITE_OTHER): Payer: Medicare Other | Admitting: Vascular Surgery

## 2021-09-07 ENCOUNTER — Encounter: Payer: Self-pay | Admitting: Vascular Surgery

## 2021-09-07 ENCOUNTER — Ambulatory Visit (HOSPITAL_COMMUNITY)
Admission: RE | Admit: 2021-09-07 | Discharge: 2021-09-07 | Disposition: A | Discharge status: Home / Self Care | Payer: Medicare Other | Source: Ambulatory Visit | Attending: Vascular Surgery | Admitting: Vascular Surgery

## 2021-09-07 ENCOUNTER — Other Ambulatory Visit: Payer: Self-pay

## 2021-09-07 VITALS — BP 138/71 | HR 87 | Temp 98.0°F | Resp 16 | Ht 63.0 in | Wt 124.0 lb

## 2021-09-07 DIAGNOSIS — I872 Venous insufficiency (chronic) (peripheral): Secondary | ICD-10-CM | POA: Diagnosis not present

## 2021-09-07 DIAGNOSIS — M7989 Other specified soft tissue disorders: Secondary | ICD-10-CM

## 2021-09-07 NOTE — Progress Notes (Signed)
Patient name: Destiny [name] MRN: [id number] DOB: February 08, 1928 Sex: female  REASON FOR CONSULT: Evaluate venous insufficiency in both lower extremities  HPI: Destiny [name] is a 88 y.o. female, with history of hypertension and hyperlipidemia who presents for evaluation of venous insufficiency in both lower extremities.  Patient states she has had years of lower extremity swelling worse in the right leg.  She recently did get some knee-high compression stockings from the drugstore that have helped.  She also has multiple prominent veins more on the right leg anteriorly.  She complains of pretibial pain where she bumped her right leg earlier this year.  No hx of venous ulcerations.  No hx of DVT.   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 Cristina Gong   High cholesterol    HTN (hypertension) 07/17/2015   Echo 08/2019: EF 18-84, grade 1 diastolic dysfunction, mild apical HK, normal RV SF, trivial pericardial effusion, moderate MAC, mild MR, mild TR   Hypothyroidism    Insomnia    Lactose intolerance    Lumbar degenerative disc disease    Myoview    Myoview 06/2019: EF 48, normal perfusion; low risk (EF normal by echo 10/2018)   Neuromuscular disorder (HCC)    Pronated ankles /Feet    Onychomycosis    Osteoarthritis    Osteoporosis    Second degree Mobitz II AV block    s/p PPM   Spinal stenosis    Thyroid disease    Varicose veins     Past Surgical History:  Procedure Laterality Date   ABDOMINAL HYSTERECTOMY     BLADDER REPAIR     blephoplasty bilateral  03/10/2014   CATARACT EXTRACTION W/ INTRAOCULAR LENS  IMPLANT, BILATERAL  7 & 8 2014   CHOLECYSTECTOMY      COLONOSCOPY  05/18/2012   EP IMPLANTABLE DEVICE N/A 08/16/2016   MDT [facility] MRI conditional PPM implanted by Dr Rayann Heman for mobitz II second degree AV block   EYE SURGERY Right 05/02/2018   laser eye surgery   herniated disc repair     INCONTINENCE SURGERY     JOINT REPLACEMENT     LAMINECTOMY  02/2011   L3-L5  Dr. Ellene Route   LUMBAR LAMINECTOMY/DECOMPRESSION MICRODISCECTOMY Right 09/21/2015   Procedure: Right Lumbar Three-FourMicrodiskectomy;  Surgeon: Kristeen Miss, MD;  Location: [facility];  Service: Neurosurgery;  Laterality: Right;  Right L3-4 Microdiskectomy   SHOULDER ARTHROSCOPY WITH ROTATOR CUFF REPAIR Right 08/2003   TONSILLECTOMY     TOTAL HIP ARTHROPLASTY     right 1999    Family History  Problem Relation Age of Onset   Arthritis Mother    Osteoporosis Mother    Heart attack Father     SOCIAL HISTORY: Social History   Socioeconomic History   Marital status: Widowed    Spouse name: Not on file   Number of children: Not on file   Years of education: Not on file   Highest education level: Not on file  Occupational History   Not on file  Tobacco Use   Smoking status: Never  Smokeless tobacco: Never  Vaping Use   Vaping Use: Never used  Substance and Sexual Activity   Alcohol use: No   Drug use: No   Sexual activity: Not Currently  Other Topics Concern   Not on file  Social History Narrative   Lives at [facility]   Caffeine 1-2 cups daily   Exercise water aerobics, walking   Never smoked   POA               Do you drink/ eat things with caffeine? Yes      Marital status:    Widow                           What year were you married ? 1957      Do you live in a house, apartment,assistred living, condo, trailer, etc.)?[facility]      Is it one or more stories?       How many persons live in your home ? 1      Do you have any pets in your home ?(please list)0       Current or past profession: Social Worker      Do you  exercise?  Yes                            Type & how often:  Water Aerobic, 3-4 hours per week       Do you have a living will? Yes      Do you have a DNR form? Yes                       If not, do you want to discuss one?       Do you have signed POA?HPOA forms?                 If so, please bring to your        appointment         Social Determinants of Health   Financial Resource Strain: Not on file  Food Insecurity: Not on file  Transportation Needs: Not on file  Physical Activity: Not on file  Stress: Not on file  Social Connections: Not on file  Intimate Partner Violence: Not on file    Allergies  Allergen Reactions   Codeine Nausea Only   Macrobid [Nitrofurantoin] Diarrhea   Miralax [Polyethylene Glycol] Other (See Comments)    cramping   Other Nausea And Vomiting and Other (See Comments)   Pain Relief [Acetaminophen] Nausea And Vomiting    Patient states she is not allergic    Paxil [Paroxetine Hcl] Nausea Only   Percodan [Oxycodone-Aspirin] Itching   Oxycodone Nausea Only   Oxycodone-Acetaminophen Nausea Only   Penicillins Rash    Has patient had a PCN reaction causing immediate rash, facial/tongue/throat swelling, SOB or lightheadedness with hypotension: YES Has patient had a PCN reaction causing severe rash involving mucus membranes or skin necrosis: YES Has patient had a PCN reaction that required hospitalization NO Has patient had a PCN reaction occurring within the last 10 years: NO If all of the above answers are "NO", then may proceed with Cephalosporin use.    Current Outpatient Medications  Medication Sig Dispense Refill   Calcium-Phosphorus-Vitamin D [phone] MG-MG-UNIT CHEW Chew 1 tablet by mouth daily.     Cyanocobalamin (VITAMIN B  12 PO) Take 1 tablet by mouth daily.     diltiazem (CARDIZEM CD) 240 MG 24 hr capsule TAKE ONE CAPSULE EVERY DAY 90 capsule 1   dorzolamide-timolol (COSOPT) 22.3-6.8 MG/ML ophthalmic solution Place 1 drop into both  eyes 2 (two) times daily.  5   furosemide (LASIX) 20 MG tablet TAKE ONE TABLET BY MOUTH EVERY OTHER DAY. 15 tablet 1   influenza vaccine adjuvanted (FLUAD) 0.5 ML injection Inject into the muscle. 0.5 mL 0   ipratropium (ATROVENT) 0.06 % nasal spray USE 2 SPRAYS IN EACH NOSTRIL 3 TIMES A DAY AS NEEDED. 15 mL 3   irbesartan (AVAPRO) 300 MG tablet Take 1 tablet (300 mg total) by mouth daily. 90 tablet 0   Loratadine 10 MG CAPS Take 1 capsule by mouth as needed.     LUMIGAN 0.01 % SOLN Place 1 drop into both eyes at bedtime.  98   metoprolol succinate (TOPROL XL) 25 MG 24 hr tablet Take 0.5 tablets (12.5 mg total) by mouth at bedtime. 15 tablet 5   Multiple Vitamins-Minerals (MULTIVITAMIN WITH MINERALS) tablet Take 1 tablet by mouth daily.     Netarsudil Dimesylate 0.02 % SOLN Apply 1 drop to eye at bedtime. Right eye     Probiotic Product (ALIGN) 4 MG CAPS Take by mouth daily.     rosuvastatin (CRESTOR) 5 MG tablet TAKE 1 TABLET ONCE DAILY FOR CHOLESTEROL. 90 tablet 1   SYNTHROID 125 MCG tablet TAKE ONE TABLET BY MOUTH ONCE DAILY 90 tablet 3   UNABLE TO FIND Take by mouth as needed. Med Name: OTC purified peppermint oil for IBS - 3 caps before lunch and dinner     UNABLE TO FIND Med Name:Hemeopathic ear drops  2 drops as needed     Vibegron (GEMTESA) 75 MG TABS Take 1 tablet by mouth daily.     LACTASE PO Take 1 tablet by mouth as needed (when eating dairy products). Take as directed (Patient not taking: Reported on 09/07/2021)     No current facility-administered medications for this visit.    REVIEW OF SYSTEMS:  _0  denotes positive finding, _1  denotes negative finding Cardiac  Comments:  Chest pain or chest pressure:    Shortness of breath upon exertion:    Short of breath when lying flat:    Irregular heart rhythm:        Vascular    Pain in calf, thigh, or hip brought on by ambulation:    Pain in feet at night that wakes you up from your sleep:     Blood clot in your veins:    Leg  swelling:         Pulmonary    Oxygen at home:    Productive cough:     Wheezing:         Neurologic    Sudden weakness in arms or legs:     Sudden numbness in arms or legs:     Sudden onset of difficulty speaking or slurred speech:    Temporary loss of vision in one eye:     Problems with dizziness:         Gastrointestinal    Blood in stool:     Vomited blood:         Genitourinary    Burning when urinating:     Blood in urine:        Psychiatric    Major depression:         Hematologic  Bleeding problems:    Problems with blood clotting too easily:        Skin    Rashes or ulcers:        Constitutional    Fever or chills:      PHYSICAL EXAM: Vitals:   09/07/21 1406  BP: 138/71  Pulse: 87  Resp: 16  Temp: 98 F (36.7 C)  TempSrc: Temporal  SpO2: 92%  Weight: 124 lb (56.2 kg)  Height: _0  (1.6 m)    GENERAL: The patient is a well-nourished female, in no acute distress. The vital signs are documented above. CARDIAC: There is a regular rate and rhythm.  VASCULAR:  Palpable femoral pulses bilaterally Palpable PT pulses bilaterally Bilateral lower extremity spider veins PULMONARY: No respiratory distress ABDOMEN: Soft and non-tender. MUSCULOSKELETAL: There are no major deformities or cyanosis. NEUROLOGIC: No focal weakness or paresthesias are detected. SKIN: There are no ulcers or rashes noted. PSYCHIATRIC: The patient has a normal affect.  DATA:    Lower Venous Reflux Study   Patient Name:  Destiny [name]  Date of Exam:   09/07/2021  Medical Rec #: [id number]        Accession #:    [id number]  Date of Birth: 20-Nov-1927       Patient Gender: F  Patient Age:   16 years  Exam Location:  [facility]  Procedure:      VAS [place] LOWER EXTREMITY VENOUS REFLUX  Referring Phys: Monica Martinez    ---------------------------------------------------------------------------  -----     Indications: Edema.     Performing  Technologist: Alvia Grove RVT      Examination Guidelines: A complete evaluation includes B-mode imaging,  spectral  Doppler, color Doppler, and power Doppler as needed of all accessible  portions  of each vessel. Bilateral testing is considered an integral part of a  complete  examination. Limited examinations for reoccurring indications may be  performed  as noted. The reflux portion of the exam is performed with the patient in  reverse Trendelenburg.  Significant venous reflux is defined as >500 ms in the superficial venous  system, and >1 second in the deep venous system.      Venous Reflux Times  +--------------+---------+------+-----------+------------+--------+   RIGHT          Reflux No Reflux Reflux Time Diameter cms Comments                              Yes                                       +--------------+---------+------+-----------+------------+--------+   CFV            no                                                   +--------------+---------+------+-----------+------------+--------+   FV mid         no                                                   +--------------+---------+------+-----------+------------+--------+  Popliteal      no                                                   +--------------+---------+------+-----------+------------+--------+   GSV at [facility]                yes     >500 ms       0.52                +--------------+---------+------+-----------+------------+--------+   GSV prox thigh            yes     >500 ms       0.52                +--------------+---------+------+-----------+------------+--------+   GSV mid thigh  no                               0.19                +--------------+---------+------+-----------+------------+--------+   GSV dist thigh            yes     >500 ms       0.23                +--------------+---------+------+-----------+------------+--------+   GSV at knee    no                               0.18                 +--------------+---------+------+-----------+------------+--------+   GSV prox calf  no                               0.13                +--------------+---------+------+-----------+------------+--------+   GSV mid calf   no                               0.28                +--------------+---------+------+-----------+------------+--------+   SSV Pop Fossa  no                               0.21                +--------------+---------+------+-----------+------------+--------+   SSV prox calf  no                               0.21                +--------------+---------+------+-----------+------------+--------+   SSV mid calf   no                               0.25                +--------------+---------+------+-----------+------------+--------+   AASV p         no  0.35                +--------------+---------+------+-----------+------------+--------+           Summary:  Right:  - No evidence of deep vein thrombosis seen in the right lower extremity,  from the common femoral through the popliteal veins.  - No evidence of superficial venous reflux seen in the right short  saphenous vein.  - Venous reflux is noted in the right sapheno-femoral junction.  - Venous reflux is noted in the right greater saphenous vein in the thigh.     *See table(s) above for measurements and observations.   Electronically signed by Monica Martinez MD on 09/07/2021 at 2:07:05 PM.  Assessment/Plan:  88 year old female presents for evaluation of venous insufficiency in the bilateral lower extremities.  Her predominant complaint is swelling worse in the right leg consistent with CEAP classification C3.  As noted above, she does have evidence of venous insufficiency in the right GSV both at the junction and proximal thigh and a second segment in the distal thigh.  Ultimately the vein is quite small except for a short segment proximally and I think she would best be served  with conservative management.  I recommended elevation with compression.  We did size her for knee-high medical compression stockings today 15 to 20 mmHg.  Discussed she call with any worsening symptoms.   Marty Heck, MD [facility] Office: [phone]

## 2021-09-20 NOTE — Progress Notes (Signed)
[facility] Return Visit  SUBJECTIVE  History of Present Illness: Destiny [name] is a 88 y.o. female seen in follow-up for overactive bladder. She is currently on Gemtesa 75mg . She does not feel she had any benefit from physical therapy.   Has noticed some constipation with the medication. Sometimes she will take miralax or a stool softener but not regularly. She is able to get to the bathroom without leaking (occasionally will leak if she wait several hours). Overall very happy with the medication and would like to continue it despite it being expensive for her.   Past Medical History: Patient  has a past medical history of Allergy, Arthritis, Bilateral carotid bruits (03/2003), Cataract, Chronic anxiety, Constipation, Diverticulosis of sigmoid colon, Emphysema of lung ([facility]), GERD (gastroesophageal reflux disease), Glaucoma, H/O adenomatous polyp of colon, High cholesterol, HTN (hypertension) (07/17/2015), Hypothyroidism, Insomnia, Lactose intolerance, Lumbar degenerative disc disease, Myoview, Neuromuscular disorder ([place]), Onychomycosis, Osteoarthritis, Osteoporosis, Second degree Mobitz II AV block, Spinal stenosis, Thyroid disease, and Varicose veins.   Past Surgical History: She  has a past surgical history that includes Cholecystectomy; Joint replacement; Abdominal hysterectomy; Shoulder arthroscopy with rotator cuff repair (Right, 08/2003); Bladder repair; Laminectomy (02/2011); Incontinence surgery; Total hip arthroplasty; Colonoscopy (05/18/2012); Cataract extraction w/ intraocular lens  implant, bilateral (7 & 8 2014); blephoplasty bilateral (03/10/2014); Lumbar laminectomy/decompression microdiscectomy (Right, 09/21/2015); Tonsillectomy; herniated disc repair; Cardiac catheterization (N/A, 08/16/2016); and Eye surgery (Right, 05/02/2018).   Medications: She has a current medication list which includes the following prescription(s): calcium-phosphorus-vitamin d, cyanocobalamin,  diltiazem, dorzolamide-timolol, furosemide, influenza vaccine adjuvanted, ipratropium, irbesartan, lactase, loratadine, lumigan, metoprolol succinate, multivitamin with minerals, netarsudil dimesylate, align, rosuvastatin, synthroid, UNABLE TO FIND, UNABLE TO FIND, and gemtesa.   Allergies: Patient is allergic to codeine, macrobid [nitrofurantoin], miralax [polyethylene glycol], other, pain relief [acetaminophen], paxil [paroxetine hcl], percodan [oxycodone-aspirin], oxycodone, oxycodone-acetaminophen, and penicillins.   Social History: Patient  reports that she has never smoked. She has never used smokeless tobacco. She reports that she does not drink alcohol and does not use drugs.      OBJECTIVE     Physical Exam: Vitals:   09/21/21 1302  BP: (!) 144/67  Pulse: 82    Gen: No apparent distress, A&O x 3.  Detailed Urogynecologic Evaluation:  Deferred.     ASSESSMENT AND PLAN    Destiny [name] is a 88 y.o. with:  1. Overactive bladder    - Continue with Gemtesa 75mg  daily, refill provided - Discussed PTNS but she prefers to stay with the medication rather than come for several treatment sessions.  - For constipation, she can take stool softener daily and miralax as needed.   Return 1 year or sooner if needed.   Destiny Folds, MD  Time spent: I spent 20 minutes dedicated to the care of this patient on the date of this encounter to include pre-visit review of records, face-to-face time with the patient and post visit documentation and ordering medication/ testing.

## 2021-09-21 ENCOUNTER — Encounter: Payer: Self-pay | Admitting: Obstetrics and Gynecology

## 2021-09-21 ENCOUNTER — Ambulatory Visit (INDEPENDENT_AMBULATORY_CARE_PROVIDER_SITE_OTHER): Payer: Medicare Other | Admitting: Obstetrics and Gynecology

## 2021-09-21 ENCOUNTER — Other Ambulatory Visit: Payer: Self-pay

## 2021-09-21 VITALS — BP 144/67 | HR 82

## 2021-09-21 DIAGNOSIS — N3281 Overactive bladder: Secondary | ICD-10-CM | POA: Diagnosis not present

## 2021-09-21 MED ORDER — GEMTESA 75 MG PO TABS: 1.0000 | ORAL_TABLET | Freq: Every day | ORAL | 11 refills | Status: DC

## 2021-10-04 DIAGNOSIS — H401131 Primary open-angle glaucoma, bilateral, mild stage: Secondary | ICD-10-CM | POA: Diagnosis not present

## 2021-10-04 DIAGNOSIS — Z961 Presence of intraocular lens: Secondary | ICD-10-CM | POA: Diagnosis not present

## 2021-10-06 ENCOUNTER — Ambulatory Visit (INDEPENDENT_AMBULATORY_CARE_PROVIDER_SITE_OTHER): Payer: Medicare Other

## 2021-10-06 DIAGNOSIS — I441 Atrioventricular block, second degree: Secondary | ICD-10-CM | POA: Diagnosis not present

## 2021-10-06 LAB — CUP PACEART REMOTE DEVICE CHECK
Battery Remaining Longevity: 57 mo
Battery Voltage: 2.99 V
Brady Statistic AP VP Percent: 6.44 %
Brady Statistic AP VS Percent: 2.06 %
Brady Statistic AS VP Percent: 76.87 %
Brady Statistic AS VS Percent: 14.63 %
Brady Statistic RA Percent Paced: 8.48 %
Brady Statistic RV Percent Paced: 83.11 %
Date Time Interrogation Session: 20230208111728
Implantable Lead Implant Date: 20171219
Implantable Lead Implant Date: 20171219
Implantable Lead Location: 753859
Implantable Lead Location: 753860
Implantable Lead Model: 5076
Implantable Lead Model: 5076
Implantable Pulse Generator Implant Date: 20171219
Lead Channel Impedance Value: 1007 Ohm
Lead Channel Impedance Value: 1083 Ohm
Lead Channel Impedance Value: 304 Ohm
Lead Channel Impedance Value: 342 Ohm
Lead Channel Pacing Threshold Amplitude: 0.625 V
Lead Channel Pacing Threshold Amplitude: 1.625 V
Lead Channel Pacing Threshold Pulse Width: 0.4 ms
Lead Channel Pacing Threshold Pulse Width: 0.4 ms
Lead Channel Sensing Intrinsic Amplitude: 3.5 mV
Lead Channel Sensing Intrinsic Amplitude: 3.5 mV
Lead Channel Sensing Intrinsic Amplitude: 6.125 mV
Lead Channel Sensing Intrinsic Amplitude: 6.125 mV
Lead Channel Setting Pacing Amplitude: 2 V
Lead Channel Setting Pacing Amplitude: 3.5 V
Lead Channel Setting Pacing Pulse Width: 0.4 ms
Lead Channel Setting Sensing Sensitivity: 2.8 mV

## 2021-10-08 ENCOUNTER — Ambulatory Visit: Payer: Medicare Other | Attending: Internal Medicine

## 2021-10-08 ENCOUNTER — Other Ambulatory Visit (HOSPITAL_BASED_OUTPATIENT_CLINIC_OR_DEPARTMENT_OTHER): Payer: Self-pay

## 2021-10-08 DIAGNOSIS — Z23 Encounter for immunization: Secondary | ICD-10-CM | POA: Diagnosis not present

## 2021-10-08 MED ORDER — MODERNA COVID-19 BIVAL BOOSTER 50 MCG/0.5ML IM SUSP
INTRAMUSCULAR | 0 refills | Status: DC
  Filled 2021-10-08: qty 0.5, 1d supply, fill #0

## 2021-10-08 NOTE — Progress Notes (Signed)
° °  Covid-19 Vaccination Clinic  Name:  Destiny [name]    MRN: [id number] DOB: May 11, 1928  10/08/2021  [name] was observed post Covid-19 immunization for 15 minutes without incident. She was provided with Vaccine Information Sheet and instruction to access the V-Safe system.   [name] was instructed to call [phone] with any severe reactions post vaccine: Difficulty breathing  Swelling of face and throat  A fast heartbeat  A bad rash all over body  Dizziness and weakness   Immunizations Administered     Name Date Dose VIS Date Route   Moderna Covid-19 vaccine Bivalent Booster 10/08/2021  2:31 PM 0.5 mL 04/10/2021 Intramuscular   Manufacturer: Moderna   Lot: [id number]   [place]: [id number]

## 2021-10-11 ENCOUNTER — Other Ambulatory Visit: Payer: Self-pay | Admitting: Internal Medicine

## 2021-10-11 NOTE — Progress Notes (Signed)
Remote pacemaker transmission.   

## 2021-10-12 DIAGNOSIS — E039 Hypothyroidism, unspecified: Secondary | ICD-10-CM | POA: Diagnosis not present

## 2021-10-12 DIAGNOSIS — R739 Hyperglycemia, unspecified: Secondary | ICD-10-CM | POA: Diagnosis not present

## 2021-10-12 DIAGNOSIS — I10 Essential (primary) hypertension: Secondary | ICD-10-CM | POA: Diagnosis not present

## 2021-10-12 LAB — BASIC METABOLIC PANEL
BUN: 17 (ref 4–21)
CO2: 24 — AB (ref 13–22)
Chloride: 111 — AB (ref 99–108)
Creatinine: 0.7 (ref 0.5–1.1)
Glucose: 103
Potassium: 4.1 (ref 3.4–5.3)
Sodium: 145 (ref 137–147)

## 2021-10-12 LAB — LIPID PANEL
Cholesterol: 145 (ref 0–200)
HDL: 70 (ref 35–70)
LDL Cholesterol: 53
LDl/HDL Ratio: 2.1
Triglycerides: 113 (ref 40–160)

## 2021-10-12 LAB — COMPREHENSIVE METABOLIC PANEL: Calcium: 9.9 (ref 8.7–10.7)

## 2021-10-12 LAB — CBC AND DIFFERENTIAL
HCT: 40 (ref 36–46)
Hemoglobin: 12.7 (ref 12.0–16.0)
Platelets: 230 (ref 150–399)
WBC: 6.9

## 2021-10-12 LAB — CBC: RBC: 4.49 (ref 3.87–5.11)

## 2021-10-12 LAB — TSH: TSH: 0.79 (ref 0.41–5.90)

## 2021-10-12 LAB — HEMOGLOBIN A1C: Hemoglobin A1C: 5.8

## 2021-10-19 ENCOUNTER — Other Ambulatory Visit: Payer: Self-pay | Admitting: Internal Medicine

## 2021-10-20 ENCOUNTER — Non-Acute Institutional Stay: Payer: Medicare Other | Admitting: Internal Medicine

## 2021-10-20 ENCOUNTER — Encounter: Payer: Self-pay | Admitting: Internal Medicine

## 2021-10-20 ENCOUNTER — Other Ambulatory Visit: Payer: Self-pay

## 2021-10-20 VITALS — BP 148/74 | HR 85 | Temp 99.3°F | Ht 63.0 in | Wt 129.0 lb

## 2021-10-20 DIAGNOSIS — M810 Age-related osteoporosis without current pathological fracture: Secondary | ICD-10-CM | POA: Diagnosis not present

## 2021-10-20 DIAGNOSIS — N3941 Urge incontinence: Secondary | ICD-10-CM | POA: Diagnosis not present

## 2021-10-20 DIAGNOSIS — I872 Venous insufficiency (chronic) (peripheral): Secondary | ICD-10-CM | POA: Diagnosis not present

## 2021-10-20 DIAGNOSIS — I10 Essential (primary) hypertension: Secondary | ICD-10-CM

## 2021-10-20 DIAGNOSIS — I5032 Chronic diastolic (congestive) heart failure: Secondary | ICD-10-CM | POA: Diagnosis not present

## 2021-10-20 DIAGNOSIS — E034 Atrophy of thyroid (acquired): Secondary | ICD-10-CM

## 2021-10-20 DIAGNOSIS — R739 Hyperglycemia, unspecified: Secondary | ICD-10-CM

## 2021-10-20 DIAGNOSIS — R140 Abdominal distension (gaseous): Secondary | ICD-10-CM

## 2021-10-20 DIAGNOSIS — R109 Unspecified abdominal pain: Secondary | ICD-10-CM | POA: Diagnosis not present

## 2021-10-22 NOTE — Progress Notes (Signed)
Location:  [place] of Service:  Clinic (12)  Provider:   Code Status:  Goals of Care:  Advanced Directives 10/20/2021  Does Patient Have a Medical Advance Directive? Yes  Type of Advance Directive Living will;Healthcare Power of Attorney  Does patient want to make changes to medical advance directive? No - Patient declined  Copy of [place] in Chart? Yes - validated most recent copy scanned in chart (See row information)  Pre-existing out of facility DNR order (yellow form or pink MOST form) -     Chief Complaint  Patient presents with   Medical Management of Chronic Issues    Patient returns to the clinic for 3 month follow, IBS concerns and knee pain (left worse than right).     HPI: Patient is a 88 y.o. female seen today for medical management of chronic diseases.   Hypertension Better on Toprol now Also on Avapro and Cardizem Lower LE edema  Takes Lasix as needed due to her Incontinence Saw Vascular for Varicose Veins Can do surgery but would be cosmetic reason  Diarrhea and Abdominal cramps Patient said that she has been diagnosed with IBS by Dr. Cristina Gong.  Diarrhea during Christmas time.  It is better now but she continues to have some abdominal cramps.  She started taking IBgard OTC.  She thinks that is helping Urinary incontinence On [place] which is helping her.  It cost her between $200 and $300 a month History of osteoporosis and L3 lumbar fracture s/p kyphoplasty by Dr Ellene Route  Uses belt which helps with the pain.  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 Cristina Gong   High cholesterol    HTN (hypertension) 07/17/2015   Echo 08/2019: EF 28-78, grade 1 diastolic dysfunction, mild apical HK, normal RV SF, trivial pericardial effusion, moderate MAC, mild MR, mild TR   Hypothyroidism    Insomnia    Lactose intolerance    Lumbar degenerative disc disease    Myoview    Myoview 06/2019: EF 48, normal perfusion; low risk (EF normal by echo 10/2018)   Neuromuscular disorder (HCC)    Pronated ankles /Feet    Onychomycosis    Osteoarthritis    Osteoporosis    Second degree Mobitz II AV block    s/p PPM   Spinal stenosis    Thyroid disease    Varicose veins     Past Surgical History:  Procedure Laterality Date   ABDOMINAL HYSTERECTOMY     BLADDER REPAIR     blephoplasty bilateral  03/10/2014   CATARACT EXTRACTION W/ INTRAOCULAR LENS  IMPLANT, BILATERAL  7 & 8 2014   CHOLECYSTECTOMY     COLONOSCOPY  05/18/2012   EP IMPLANTABLE DEVICE N/A 08/16/2016   MDT Advisa MRI conditional PPM implanted by Dr Rayann Heman for mobitz II second degree AV block   EYE SURGERY Right 05/02/2018   laser eye  surgery   herniated disc repair     INCONTINENCE SURGERY     JOINT REPLACEMENT     LAMINECTOMY  02/2011   L3-L5  Dr. Ellene Route   LUMBAR LAMINECTOMY/DECOMPRESSION MICRODISCECTOMY Right 09/21/2015   Procedure: Right Lumbar Three-FourMicrodiskectomy;  Surgeon: Kristeen Miss, MD;  Location: [facility];  Service: Neurosurgery;  Laterality: Right;  Right L3-4 Microdiskectomy   SHOULDER ARTHROSCOPY WITH ROTATOR CUFF REPAIR Right 08/2003   TONSILLECTOMY     TOTAL HIP ARTHROPLASTY     right 1999    Allergies  Allergen Reactions   Codeine Nausea Only   Macrobid [Nitrofurantoin] Diarrhea   Miralax [Polyethylene Glycol] Other (See Comments)    cramping   Other Nausea And Vomiting and Other (See Comments)   Pain Relief [Acetaminophen] Nausea And Vomiting    Patient  states she is not allergic    Paxil [Paroxetine Hcl] Nausea Only   Percodan [Oxycodone-Aspirin] Itching   Oxycodone Nausea Only   Oxycodone-Acetaminophen Nausea Only   Penicillins Rash    Has patient had a PCN reaction causing immediate rash, facial/tongue/throat swelling, SOB or lightheadedness with hypotension: YES Has patient had a PCN reaction causing severe rash involving mucus membranes or skin necrosis: YES Has patient had a PCN reaction that required hospitalization NO Has patient had a PCN reaction occurring within the last 10 years: NO If all of the above answers are "NO", then may proceed with Cephalosporin use.    Outpatient Encounter Medications as of 10/20/2021  Medication Sig   Calcium-Phosphorus-Vitamin D [phone] MG-MG-UNIT CHEW Chew 1 tablet by mouth daily.   COVID-19 mRNA bivalent vaccine, Moderna, (MODERNA COVID-19 BIVAL BOOSTER) 50 MCG/0.5ML injection Inject into the muscle.   diltiazem (CARDIZEM CD) 240 MG 24 hr capsule TAKE ONE CAPSULE EVERY DAY   dorzolamide-timolol (COSOPT) 22.3-6.8 MG/ML ophthalmic solution Place 1 drop into both eyes 2 (two) times daily.   furosemide (LASIX) 20 MG tablet TAKE ONE TABLET BY MOUTH EVERY OTHER DAY.   influenza vaccine adjuvanted (FLUAD) 0.5 ML injection Inject into the muscle.   ipratropium (ATROVENT) 0.06 % nasal spray USE 2 SPRAYS IN EACH NOSTRIL 3 TIMES A DAY AS NEEDED.   irbesartan (AVAPRO) 300 MG tablet Take 1 tablet (300 mg total) by mouth daily.   LACTASE PO Take 1 tablet by mouth as needed (when eating dairy products). Take as directed   Loratadine 10 MG CAPS Take 1 capsule by mouth as needed.   LUMIGAN 0.01 % SOLN Place 1 drop into both eyes at bedtime.   metoprolol succinate (TOPROL XL) 25 MG 24 hr tablet Take 0.5 tablets (12.5 mg total) by mouth at bedtime.   Multiple Vitamins-Minerals (MULTIVITAMIN WITH MINERALS) tablet Take 1 tablet by mouth daily.   Netarsudil Dimesylate 0.02 % SOLN Apply 1 drop to eye at bedtime.  Right eye   Probiotic Product (ALIGN) 4 MG CAPS Take by mouth daily.   rosuvastatin (CRESTOR) 5 MG tablet TAKE ONE TABLET BY MOUTH ONCE DAILY CHOLESTEROL   SYNTHROID 125 MCG tablet TAKE ONE TABLET BY MOUTH ONCE DAILY   UNABLE TO FIND Take by mouth as needed. Med Name: OTC purified peppermint oil for IBS - 3 caps before lunch and dinner   UNABLE TO FIND Med Name:Hemeopathic ear drops  2 drops as needed   Vibegron (GEMTESA) 75 MG TABS Take 1 tablet by mouth daily.   Cyanocobalamin (VITAMIN B 12 PO) Take 1 tablet by mouth daily. (Patient not taking: Reported on 10/20/2021)   No facility-administered encounter  medications on file as of 10/20/2021.    Review of Systems:  Review of Systems  Constitutional:  Negative for activity change and appetite change.  HENT: Negative.    Respiratory:  Negative for cough and shortness of breath.   Cardiovascular:  Positive for leg swelling.  Gastrointestinal:  Positive for abdominal distention and diarrhea. Negative for constipation.  Genitourinary:  Positive for frequency.  Musculoskeletal:  Positive for back pain and gait problem. Negative for arthralgias and myalgias.  Skin: Negative.   Neurological:  Negative for dizziness and weakness.  Psychiatric/Behavioral:  Negative for confusion, dysphoric mood and sleep disturbance.    Health Maintenance  Topic Date Due   Pneumonia Vaccine 29+ Years old (2 - PCV) 07/27/2023 (Originally 07/15/2015)   TETANUS/TDAP  05/10/2031   INFLUENZA VACCINE  Completed   DEXA SCAN  Completed   COVID-19 Vaccine  Completed   Zoster Vaccines- Shingrix  Completed   HPV VACCINES  Aged Out    Physical Exam: Vitals:   10/20/21 1104  BP: (!) 148/74  Pulse: 85  Temp: 99.3 F (37.4 C)  SpO2: 95%  Weight: 129 lb (58.5 kg)  Height: _0  (1.6 m)   Body mass index is 22.85 kg/m. Physical Exam Vitals reviewed.  Constitutional:      Appearance: Normal appearance.  HENT:     Head: Normocephalic.     Nose: Nose normal.      Mouth/Throat:     Mouth: Mucous membranes are moist.     Pharynx: Oropharynx is clear.  Eyes:     Pupils: Pupils are equal, round, and reactive to light.  Cardiovascular:     Rate and Rhythm: Normal rate and regular rhythm.     Pulses: Normal pulses.     Heart sounds: Normal heart sounds. No murmur heard. Pulmonary:     Effort: Pulmonary effort is normal.     Breath sounds: Normal breath sounds.  Abdominal:     General: Abdomen is flat. Bowel sounds are normal.     Palpations: Abdomen is soft.  Musculoskeletal:     Cervical back: Neck supple.     Comments: Mild Edema with Varicose Veins   Skin:    General: Skin is warm.  Neurological:     General: No focal deficit present.     Mental Status: She is alert and oriented to person, place, and time.  Psychiatric:        Mood and Affect: Mood normal.        Thought Content: Thought content normal.    Labs reviewed: Basic Metabolic Panel: Recent Labs    03/11/21 0000 07/20/21 0000 10/12/21 0000  NA 141 143 145  K 4.1 3.8 4.1  CL 105 109* 111*  CO2 23* 25* 24*  BUN _1 CREATININE 0.5 0.5 0.7  CALCIUM 10.0 10.0 9.9  TSH  --   --  0.79   Liver Function Tests: No results for input(s): AST, ALT, ALKPHOS, BILITOT, PROT, ALBUMIN in the last 8760 hours. No results for input(s): LIPASE, AMYLASE in the last 8760 hours. No results for input(s): AMMONIA in the last 8760 hours. CBC: Recent Labs    03/11/21 0000 10/12/21 0000  WBC 5.5 6.9  HGB 13.0 12.7  HCT 39 40  PLT 211 230   Lipid Panel: Recent Labs    10/12/21 0000  CHOL 145  HDL 70  LDLCALC 53  TRIG 113   Lab Results  Component Value Date   HGBA1C 5.8 10/12/2021  Procedures since last visit: CUP [place]  Result Date: 10/06/2021 Scheduled remote reviewed. Normal device function.  Next remote 91 days. LA   Assessment/Plan 1. Abdominal bloating with cramps Is trying IB guard GI Referal if symptoms not better  2. Venous  insufficiency of both lower extremities Saw Vascular No Intervention Continue PRN lasix  3. Primary hypertension BP good for her age  66. Hypothyroidism due to acquired atrophy of thyroid Tsh normal   5. Senile osteoporosis On Prolia  6. Chronic diastolic congestive heart failure (HCC) Lasix QOD  7. Urge incontinence Gemtesa D/w Urology not many other options right now 8 HLD LDL good level with Crestor Hyperglycemia A1C reassuring     Labs/tests ordered:  * No order type specified * Next appt:  12/07/2021

## 2021-10-28 ENCOUNTER — Other Ambulatory Visit: Payer: Self-pay | Admitting: Internal Medicine

## 2021-12-07 ENCOUNTER — Ambulatory Visit (INDEPENDENT_AMBULATORY_CARE_PROVIDER_SITE_OTHER): Payer: Medicare Other | Admitting: *Deleted

## 2021-12-07 DIAGNOSIS — M810 Age-related osteoporosis without current pathological fracture: Secondary | ICD-10-CM

## 2021-12-07 MED ORDER — DENOSUMAB 60 MG/ML ~~LOC~~ SOSY
60.0000 mg | PREFILLED_SYRINGE | Freq: Once | SUBCUTANEOUS | Status: AC
  Administered 2021-12-07: 60 mg via SUBCUTANEOUS

## 2021-12-27 ENCOUNTER — Other Ambulatory Visit: Payer: Self-pay | Admitting: Internal Medicine

## 2022-02-02 NOTE — Progress Notes (Signed)
Submitted PA for Destiny [name] is a 88 y.o. female on Cover my Meds. Key: [id number]  Rx #: [id number]   Status: Approved  Prior Auth: Coverage Start Date:02/02/2022; Coverage End Date:02/03/2023  Pharmacy was notified

## 2022-02-07 ENCOUNTER — Other Ambulatory Visit: Payer: Self-pay | Admitting: Internal Medicine

## 2022-02-11 ENCOUNTER — Encounter: Payer: Self-pay | Admitting: Adult Health

## 2022-02-14 ENCOUNTER — Encounter: Payer: Self-pay | Admitting: Adult Health

## 2022-02-14 ENCOUNTER — Non-Acute Institutional Stay: Payer: Medicare Other | Admitting: Adult Health

## 2022-02-14 VITALS — BP 146/76 | HR 101 | Temp 99.5°F | Ht 63.0 in | Wt 130.8 lb

## 2022-02-14 DIAGNOSIS — M79604 Pain in right leg: Secondary | ICD-10-CM

## 2022-02-14 DIAGNOSIS — J3089 Other allergic rhinitis: Secondary | ICD-10-CM | POA: Diagnosis not present

## 2022-02-14 DIAGNOSIS — M810 Age-related osteoporosis without current pathological fracture: Secondary | ICD-10-CM | POA: Diagnosis not present

## 2022-02-14 DIAGNOSIS — I10 Essential (primary) hypertension: Secondary | ICD-10-CM | POA: Diagnosis not present

## 2022-02-14 DIAGNOSIS — M79605 Pain in left leg: Secondary | ICD-10-CM | POA: Diagnosis not present

## 2022-02-14 DIAGNOSIS — I872 Venous insufficiency (chronic) (peripheral): Secondary | ICD-10-CM | POA: Diagnosis not present

## 2022-02-14 DIAGNOSIS — I441 Atrioventricular block, second degree: Secondary | ICD-10-CM

## 2022-02-14 DIAGNOSIS — R350 Frequency of micturition: Secondary | ICD-10-CM

## 2022-02-14 NOTE — Progress Notes (Signed)
Location:  [facility]  POS: Clinic  Provider: Royal Hawthorn, ANP  Code Status: DNR Goals of Care:     02/11/2022    4:20 PM  Advanced Directives  Does Patient Have a Medical Advance Directive? Yes  Type of Advance Directive [place]  Does patient want to make changes to medical advance directive? No - Patient declined  Copy of [place] in Chart? Yes - validated most recent copy scanned in chart (See row information)     Chief Complaint  Patient presents with   Medical Management of Chronic Issues    Patient returns to the clinic for her 3 month follow up.     HPI: Patient is a 88 y.o. female seen today for medical management of chronic diseases.   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    OP: Continues on prolia T score -1.0 in 2019  Taking gemtesa for OAB and is concerned that it is not working as well. Feels some irritation when voiding. No flank pain. No blood in urine. Urinates one time per night, more frequently during the day.  Feels constipated with gemtesa and needs stool softner which helps.   HLD on Crestor  LDL 53 10/12/21  Has chronic rhinitis with clear and yellow drainage. Going on for months. No sore throat, cough. However, she says both of her legs are achy at night.   Has low grade temp 99.5, rapid covid swab done and negative in apt.  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 Cristina Gong   High cholesterol    HTN (hypertension) 07/17/2015   Echo 08/2019: EF 65-53, grade 1 diastolic dysfunction, mild apical HK, normal RV  SF, trivial pericardial effusion, moderate MAC, mild MR, mild TR   Hypothyroidism    Insomnia    Lactose intolerance    Lumbar degenerative disc disease    Myoview    Myoview 06/2019: EF 48, normal perfusion; low risk (EF normal by echo 10/2018)   Neuromuscular disorder (HCC)    Pronated ankles /Feet    Onychomycosis    Osteoarthritis    Osteoporosis    Second degree Mobitz II AV block    s/p PPM   Spinal stenosis    Thyroid disease    Varicose veins     Past Surgical History:  Procedure Laterality Date   ABDOMINAL HYSTERECTOMY     BLADDER REPAIR     blephoplasty bilateral  03/10/2014   CATARACT EXTRACTION W/ INTRAOCULAR LENS  IMPLANT, BILATERAL  7 & 8 2014   CHOLECYSTECTOMY     COLONOSCOPY  05/18/2012   EP IMPLANTABLE DEVICE N/A 08/16/2016   MDT Advisa MRI conditional PPM implanted by Dr Rayann Heman for mobitz II second degree AV block   EYE SURGERY Right 05/02/2018   laser eye surgery   herniated disc repair     INCONTINENCE SURGERY     JOINT REPLACEMENT     LAMINECTOMY  02/2011   L3-L5  Dr. Ellene Route   LUMBAR  LAMINECTOMY/DECOMPRESSION MICRODISCECTOMY Right 09/21/2015   Procedure: Right Lumbar Three-FourMicrodiskectomy;  Surgeon: Kristeen Miss, MD;  Location: [facility];  Service: Neurosurgery;  Laterality: Right;  Right L3-4 Microdiskectomy   SHOULDER ARTHROSCOPY WITH ROTATOR CUFF REPAIR Right 08/2003   TONSILLECTOMY     TOTAL HIP ARTHROPLASTY     right 1999    Allergies  Allergen Reactions   Codeine Nausea Only   Macrobid [Nitrofurantoin] Diarrhea   Miralax [Polyethylene Glycol] Other (See Comments)    cramping   Other Nausea And Vomiting and Other (See Comments)   Pain Relief [Acetaminophen] Nausea And Vomiting    Patient states she is not allergic    Paxil [Paroxetine Hcl] Nausea Only   Percodan [Oxycodone-Aspirin] Itching   Oxycodone Nausea Only   Oxycodone-Acetaminophen Nausea Only   Penicillins Rash    Has patient had a PCN reaction causing immediate rash,  facial/tongue/throat swelling, SOB or lightheadedness with hypotension: YES Has patient had a PCN reaction causing severe rash involving mucus membranes or skin necrosis: YES Has patient had a PCN reaction that required hospitalization NO Has patient had a PCN reaction occurring within the last 10 years: NO If all of the above answers are "NO", then may proceed with Cephalosporin use.    Outpatient Encounter Medications as of 02/14/2022  Medication Sig   Calcium-Phosphorus-Vitamin D [phone] MG-MG-UNIT CHEW Chew 1 tablet by mouth daily.   COVID-19 mRNA bivalent vaccine, Moderna, (MODERNA COVID-19 BIVAL BOOSTER) 50 MCG/0.5ML injection Inject into the muscle.   Cyanocobalamin (VITAMIN B 12 PO) Take 1 tablet by mouth daily. (Patient not taking: Reported on 10/20/2021)   diltiazem (CARDIZEM CD) 240 MG 24 hr capsule TAKE ONE CAPSULE BY MOUTH EVERY DAY   dorzolamide-timolol (COSOPT) 22.3-6.8 MG/ML ophthalmic solution Place 1 drop into both eyes 2 (two) times daily.   furosemide (LASIX) 20 MG tablet TAKE ONE TABLET BY MOUTH EVERY OTHER DAY.   influenza vaccine adjuvanted (FLUAD) 0.5 ML injection Inject into the muscle.   ipratropium (ATROVENT) 0.06 % nasal spray USE TWO SPRAYS IN EACH NOSTRIL THREE TIMES DAILY AS NEEDED   irbesartan (AVAPRO) 300 MG tablet Take 1 tablet (300 mg total) by mouth daily.   LACTASE PO Take 1 tablet by mouth as needed (when eating dairy products). Take as directed   Loratadine 10 MG CAPS Take 1 capsule by mouth as needed.   LUMIGAN 0.01 % SOLN Place 1 drop into both eyes at bedtime.   metoprolol succinate (TOPROL-XL) 25 MG 24 hr tablet Take 0.5 tablets (12.5 mg total) by mouth at bedtime.   Multiple Vitamins-Minerals (MULTIVITAMIN WITH MINERALS) tablet Take 1 tablet by mouth daily.   Netarsudil Dimesylate 0.02 % SOLN Apply 1 drop to eye at bedtime. Right eye   Probiotic Product (ALIGN) 4 MG CAPS Take by mouth daily.   rosuvastatin (CRESTOR) 5 MG tablet TAKE ONE TABLET BY  MOUTH ONCE DAILY CHOLESTEROL   SYNTHROID 125 MCG tablet TAKE ONE TABLET BY MOUTH ONCE DAILY   UNABLE TO FIND Take by mouth as needed. Med Name: OTC purified peppermint oil for IBS - 3 caps before lunch and dinner   UNABLE TO FIND Med Name:Hemeopathic ear drops  2 drops as needed   Vibegron (GEMTESA) 75 MG TABS Take 1 tablet by mouth daily.   No facility-administered encounter medications on file as of 02/14/2022.    Review of Systems:  Review of Systems  Constitutional:  Positive for fever. Negative for activity change, appetite change, chills, diaphoresis, fatigue and unexpected weight  change.  HENT:  Positive for congestion (chronic) and rhinorrhea. Negative for ear pain, facial swelling, nosebleeds, sinus pressure, sinus pain, sneezing, sore throat, tinnitus and trouble swallowing.   Respiratory:  Negative for cough, shortness of breath and wheezing.   Cardiovascular:  Positive for leg swelling (chronic R>L). Negative for chest pain and palpitations.  Gastrointestinal:  Negative for abdominal distention, abdominal pain, constipation, diarrhea, nausea and vomiting.  Genitourinary:  Positive for frequency. Negative for difficulty urinating, dysuria, flank pain, hematuria, menstrual problem, pelvic pain and urgency.  Musculoskeletal:  Positive for gait problem. Negative for arthralgias, back pain, joint swelling and myalgias.  Skin:  Negative for rash.  Neurological:  Negative for dizziness, tremors, seizures, syncope, facial asymmetry, speech difficulty, weakness, light-headedness, numbness and headaches.  Psychiatric/Behavioral:  Negative for agitation, behavioral problems and confusion.     Health Maintenance  Topic Date Due   Pneumonia Vaccine 39+ Years old (2 - PCV) 07/27/2023 (Originally 07/15/2015)   INFLUENZA VACCINE  03/29/2022   TETANUS/TDAP  05/10/2031   DEXA SCAN  Completed   COVID-19 Vaccine  Completed   Zoster Vaccines- Shingrix  Completed   HPV VACCINES  Aged Out     Physical Exam: Vitals:   02/14/22 1329  BP: (!) 146/76  Pulse: (!) 101  Temp: 99.5 F (37.5 C)  SpO2: 93%  Weight: 130 lb 12.8 oz (59.3 kg)  Height: _0  (1.6 m)   Body mass index is 23.17 kg/m. Wt Readings from Last 3 Encounters:  02/14/22 130 lb 12.8 oz (59.3 kg)  10/20/21 129 lb (58.5 kg)  09/07/21 124 lb (56.2 kg)    Physical Exam Vitals and nursing note reviewed.  Constitutional:      General: She is not in acute distress.    Appearance: She is not diaphoretic.  HENT:     Head: Normocephalic and atraumatic.     Right Ear: Tympanic membrane, ear canal and external ear normal.     Left Ear: Tympanic membrane, ear canal and external ear normal.     Nose: Nose normal. No congestion.     Mouth/Throat:     Mouth: Mucous membranes are moist.     Pharynx: Oropharynx is clear. No oropharyngeal exudate or posterior oropharyngeal erythema.  Eyes:     Conjunctiva/sclera: Conjunctivae normal.     Pupils: Pupils are equal, round, and reactive to light.  Neck:     Thyroid: No thyromegaly.     Vascular: No carotid bruit or JVD.  Cardiovascular:     Rate and Rhythm: Normal rate and regular rhythm.     Heart sounds: Normal heart sounds. No murmur heard. Pulmonary:     Effort: Pulmonary effort is normal. No respiratory distress.     Breath sounds: Normal breath sounds. No stridor.  Abdominal:     General: Bowel sounds are normal.     Palpations: Abdomen is soft.  Musculoskeletal:        General: Normal range of motion.     Cervical back: No rigidity. No muscular tenderness.     Right lower leg: Edema (+1) present.     Left lower leg: Edema (trace) present.  Lymphadenopathy:     Cervical: No cervical adenopathy.  Skin:    General: Skin is warm and dry.  Neurological:     General: No focal deficit present.     Mental Status: She is alert and oriented to person, place, and time. Mental status is at baseline.     Cranial Nerves: No cranial  nerve deficit.  Psychiatric:         Mood and Affect: Mood normal.     Labs reviewed: Basic Metabolic Panel: Recent Labs    03/11/21 0000 07/20/21 0000 10/12/21 0000  NA 141 143 145  K 4.1 3.8 4.1  CL 105 109* 111*  CO2 23* 25* 24*  BUN _0 CREATININE 0.5 0.5 0.7  CALCIUM 10.0 10.0 9.9  TSH  --   --  0.79   Liver Function Tests: No results for input(s): "AST", "ALT", "ALKPHOS", "BILITOT", "PROT", "ALBUMIN" in the last 8760 hours. No results for input(s): "LIPASE", "AMYLASE" in the last 8760 hours. No results for input(s): "AMMONIA" in the last 8760 hours. CBC: Recent Labs    03/11/21 0000 10/12/21 0000  WBC 5.5 6.9  HGB 13.0 12.7  HCT 39 40  PLT 211 230   Lipid Panel: Recent Labs    10/12/21 0000  CHOL 145  HDL 70  LDLCALC 53  TRIG 113   Lab Results  Component Value Date   HGBA1C 5.8 10/12/2021    Procedures since last visit: No results found.  Assessment/Plan  1. Pain in both lower extremities Present at night  No current injury Will check labs ?body aches from UTI, covid swab neg.   2. Urinary frequency She has OAB but feels it is worse.also with fever and some mild dysuria UA C and S Sent stat  3. Essential hypertension Controlled   4. Venous insufficiency of both lower extremities Takes lasix qod Has chronic mild edema, no sob   5. Second degree Mobitz II AV block S/p pacemaker   6. Senile osteoporosis On prolia  7. Non-seasonal allergic rhinitis due to other allergic trigger Chronic and unchanged Continue atrovent and claritin    Labs/tests ordered:  * No order type specified * CBC BMP in am UA C and S state Covid swab  Next appt:  06/10/2022   Total time 56mn:  time greater than 50% of total time spent doing pt counseling and coordination of care

## 2022-02-15 DIAGNOSIS — M79604 Pain in right leg: Secondary | ICD-10-CM | POA: Diagnosis not present

## 2022-02-17 ENCOUNTER — Other Ambulatory Visit: Payer: Self-pay | Admitting: Adult Health

## 2022-04-13 DIAGNOSIS — H401131 Primary open-angle glaucoma, bilateral, mild stage: Secondary | ICD-10-CM | POA: Diagnosis not present

## 2022-04-15 ENCOUNTER — Ambulatory Visit (INDEPENDENT_AMBULATORY_CARE_PROVIDER_SITE_OTHER): Payer: Medicare Other | Admitting: Internal Medicine

## 2022-04-15 VITALS — BP 142/78 | HR 85 | Ht 63.0 in | Wt 125.0 lb

## 2022-04-15 DIAGNOSIS — Z950 Presence of cardiac pacemaker: Secondary | ICD-10-CM | POA: Diagnosis not present

## 2022-04-15 DIAGNOSIS — I441 Atrioventricular block, second degree: Secondary | ICD-10-CM

## 2022-04-15 DIAGNOSIS — I10 Essential (primary) hypertension: Secondary | ICD-10-CM

## 2022-04-15 NOTE — Patient Instructions (Addendum)
Medication Instructions:  Your physician recommends that you continue on your current medications as directed. Please refer to the Current Medication list given to you today.  *If you need a refill on your cardiac medications before your next appointment, please call your pharmacy*  Lab Work: None ordered.  If you have labs (blood work) drawn today and your tests are completely normal, you will receive your results only by: [place] (if you have MyChart) OR A paper copy in the mail If you have any lab test that is abnormal or we need to change your treatment, we will call you to review the results.  Testing/Procedures: None ordered.  Follow-Up:  IN 1 YEAR WITH RENEE URSUY, PA-C.   Important Information About Sugar

## 2022-04-15 NOTE — Progress Notes (Signed)
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 female who presents today for routine electrophysiology followup.  Since last being seen in our clinic, the patient reports doing very well.  Today, she denies symptoms of palpitations, chest pain,  dizziness, presyncope, or syncope.  Edema and SOB are stable.  The patient is otherwise without complaint today.   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 Cristina Gong   High cholesterol    HTN (hypertension) 07/17/2015   Echo 08/2019: EF 27-51, grade 1 diastolic dysfunction, mild apical HK, normal RV SF, trivial pericardial effusion, moderate MAC, mild MR, mild TR   Hypothyroidism    Insomnia    Lactose intolerance    Lumbar degenerative disc disease    Myoview    Myoview 06/2019: EF 48, normal perfusion; low risk (EF normal by echo 10/2018)   Neuromuscular disorder (HCC)    Pronated ankles /Feet    Onychomycosis    Osteoarthritis    Osteoporosis    Second degree Mobitz II AV block    s/p PPM   Spinal stenosis    Thyroid disease    Varicose veins    Past Surgical History:  Procedure Laterality Date   ABDOMINAL HYSTERECTOMY     BLADDER REPAIR     blephoplasty bilateral  03/10/2014   CATARACT EXTRACTION W/ INTRAOCULAR LENS  IMPLANT, BILATERAL  7 & 8 2014   CHOLECYSTECTOMY     COLONOSCOPY  05/18/2012   EP IMPLANTABLE DEVICE N/A 08/16/2016   MDT Advisa MRI conditional PPM implanted by Dr Rayann Heman for mobitz II second degree AV block   EYE SURGERY Right 05/02/2018   laser eye surgery   herniated disc repair     INCONTINENCE SURGERY     JOINT REPLACEMENT     LAMINECTOMY  02/2011   L3-L5   Dr. Ellene Route   LUMBAR LAMINECTOMY/DECOMPRESSION MICRODISCECTOMY Right 09/21/2015   Procedure: Right Lumbar Three-FourMicrodiskectomy;  Surgeon: Kristeen Miss, MD;  Location: [facility];  Service: Neurosurgery;  Laterality: Right;  Right L3-4 Microdiskectomy   SHOULDER ARTHROSCOPY WITH ROTATOR CUFF REPAIR Right 08/2003   TONSILLECTOMY     TOTAL HIP ARTHROPLASTY     right 1999    ROS- all systems are reviewed and negative except as per HPI above  Current Outpatient Medications  Medication Sig Dispense Refill   Calcium-Phosphorus-Vitamin D 250-107-500 MG-MG-UNIT CHEW Chew 1 tablet by mouth daily.     diltiazem (CARDIZEM CD) 240 MG 24 hr capsule TAKE ONE CAPSULE BY MOUTH EVERY DAY 90 capsule 3   dorzolamide-timolol (COSOPT) 22.3-6.8 MG/ML ophthalmic solution Place 1 drop into both eyes 2 (two) times daily.  5   furosemide (LASIX) 20 MG tablet TAKE ONE TABLET BY MOUTH EVERY OTHER DAY. 15 tablet 1   ipratropium (ATROVENT) 0.06 % nasal spray USE TWO SPRAYS IN EACH NOSTRIL THREE TIMES DAILY AS NEEDED 15 mL 3   irbesartan (AVAPRO) 300 MG tablet Take 1 tablet (300 mg total) by mouth daily. 90 tablet 1   LACTASE  PO Take 1 tablet by mouth as needed (when eating dairy products). Take as directed     Loratadine 10 MG CAPS Take 1 capsule by mouth as needed.     LUMIGAN 0.01 % SOLN Place 1 drop into both eyes at bedtime.  98   metoprolol succinate (TOPROL-XL) 25 MG 24 hr tablet Take 0.5 tablets (12.5 mg total) by mouth at bedtime. 15 tablet 5   Multiple Vitamins-Minerals (MULTIVITAMIN WITH MINERALS) tablet Take 1 tablet by mouth daily.     Netarsudil Dimesylate 0.02 % SOLN Apply 1 drop to eye at bedtime. Right eye     Probiotic Product (ALIGN) 4 MG CAPS Take by mouth daily.     rosuvastatin (CRESTOR) 5 MG tablet TAKE ONE TABLET BY MOUTH ONCE DAILY CHOLESTEROL 90 tablet 3   SYNTHROID 125 MCG tablet TAKE ONE TABLET BY MOUTH ONCE DAILY 90 tablet 3   UNABLE TO FIND Take by mouth as needed. Med Name: OTC  purified peppermint oil for IBS - 3 caps before lunch and dinner     UNABLE TO FIND Med Name:Hemeopathic ear drops  2 drops as needed     Vibegron (GEMTESA) 75 MG TABS Take 1 tablet by mouth daily. 30 tablet 11   No current facility-administered medications for this visit.    Physical Exam: Vitals:   04/15/22 1401  BP: (!) 142/78  Pulse: 85  SpO2: 95%  Weight: 125 lb (56.7 kg)  Height: _0  (1.6 m)    GEN- The patient is well appearing, alert and oriented x 3 today.   Head- normocephalic, atraumatic Eyes-  Sclera clear, conjunctiva pink Ears- hearing intact Oropharynx- clear Lungs- Clear to ausculation bilaterally, normal work of breathing Chest- pacemaker pocket is well healed Heart- Regular rate and rhythm, no murmurs, rubs or gallops, PMI not laterally displaced GI- soft, NT, ND, + BS Extremities- no clubbing, cyanosis, or edema  Pacemaker interrogation- reviewed in detail today,  See PACEART report  ekg tracing ordered today is personally reviewed and shows sinus with V pacing  Assessment and Plan:  1. Symptomatic second degree heart block Normal pacemaker function See Pace Art report Gradually increased RV lead impedance is noted.  RV pace/ sense function is reasonably stable.  RV lead threshold is 2 _1 .4 msec today.  We have extended pulse width and is is programmed at 3.5V _2 .7 msec.  I have reduced sensitivity from 2.8 to 5 today. We will follow closely.  She is not current up to date with remotes.  We discussed this today. Changed to DDDR today (MVP turned off) she is device dependant today  2. HTN Stable No change required today  Return to see EP APP in a year  Thompson Grayer MD, [facility] 04/15/2022 2:39 PM

## 2022-04-18 ENCOUNTER — Other Ambulatory Visit: Payer: Self-pay | Admitting: Internal Medicine

## 2022-04-18 ENCOUNTER — Ambulatory Visit (INDEPENDENT_AMBULATORY_CARE_PROVIDER_SITE_OTHER): Payer: Medicare Other

## 2022-04-18 DIAGNOSIS — I441 Atrioventricular block, second degree: Secondary | ICD-10-CM

## 2022-04-20 LAB — CUP PACEART INCLINIC DEVICE CHECK
Battery Remaining Longevity: 49 mo
Battery Voltage: 2.99 V
Brady Statistic AP VP Percent: 9.46 %
Brady Statistic AP VS Percent: 2.51 %
Brady Statistic AS VP Percent: 78.46 %
Brady Statistic AS VS Percent: 9.56 %
Brady Statistic RA Percent Paced: 11.92 %
Brady Statistic RV Percent Paced: 87.66 %
Date Time Interrogation Session: 20230818163026
Implantable Lead Implant Date: 20171219
Implantable Lead Implant Date: 20171219
Implantable Lead Location: 753859
Implantable Lead Location: 753860
Implantable Lead Model: 5076
Implantable Lead Model: 5076
Implantable Pulse Generator Implant Date: 20171219
Lead Channel Impedance Value: 1045 Ohm
Lead Channel Impedance Value: 1140 Ohm
Lead Channel Impedance Value: 304 Ohm
Lead Channel Impedance Value: 361 Ohm
Lead Channel Pacing Threshold Amplitude: 0.5 V
Lead Channel Pacing Threshold Amplitude: 0.625 V
Lead Channel Pacing Threshold Amplitude: 1.625 V
Lead Channel Pacing Threshold Amplitude: 2 V
Lead Channel Pacing Threshold Pulse Width: 0.4 ms
Lead Channel Pacing Threshold Pulse Width: 0.4 ms
Lead Channel Pacing Threshold Pulse Width: 0.4 ms
Lead Channel Pacing Threshold Pulse Width: 0.4 ms
Lead Channel Sensing Intrinsic Amplitude: 3.125 mV
Lead Channel Sensing Intrinsic Amplitude: 5.75 mV
Lead Channel Setting Pacing Amplitude: 2 V
Lead Channel Setting Pacing Amplitude: 3.5 V
Lead Channel Setting Pacing Pulse Width: 0.7 ms
Lead Channel Setting Sensing Sensitivity: 5.6 mV

## 2022-04-20 LAB — CUP PACEART REMOTE DEVICE CHECK
Battery Remaining Longevity: 49 mo
Battery Voltage: 2.99 V
Brady Statistic AP VP Percent: 11.2 %
Brady Statistic AP VS Percent: 0 %
Brady Statistic AS VP Percent: 88.76 %
Brady Statistic AS VS Percent: 0.04 %
Brady Statistic RA Percent Paced: 11.17 %
Brady Statistic RV Percent Paced: 99.85 %
Date Time Interrogation Session: 20230821135913
Implantable Lead Implant Date: 20171219
Implantable Lead Implant Date: 20171219
Implantable Lead Location: 753859
Implantable Lead Location: 753860
Implantable Lead Model: 5076
Implantable Lead Model: 5076
Implantable Pulse Generator Implant Date: 20171219
Lead Channel Impedance Value: 1064 Ohm
Lead Channel Impedance Value: 1140 Ohm
Lead Channel Impedance Value: 285 Ohm
Lead Channel Impedance Value: 342 Ohm
Lead Channel Pacing Threshold Amplitude: 0.625 V
Lead Channel Pacing Threshold Amplitude: 1.75 V
Lead Channel Pacing Threshold Pulse Width: 0.4 ms
Lead Channel Pacing Threshold Pulse Width: 0.4 ms
Lead Channel Sensing Intrinsic Amplitude: 3.25 mV
Lead Channel Sensing Intrinsic Amplitude: 3.25 mV
Lead Channel Sensing Intrinsic Amplitude: 5.75 mV
Lead Channel Sensing Intrinsic Amplitude: 5.75 mV
Lead Channel Setting Pacing Amplitude: 2 V
Lead Channel Setting Pacing Amplitude: 3.5 V
Lead Channel Setting Pacing Pulse Width: 0.4 ms
Lead Channel Setting Sensing Sensitivity: 5.6 mV

## 2022-05-16 NOTE — Progress Notes (Signed)
[facility] Summary Report / Loop Recorder 

## 2022-05-30 ENCOUNTER — Other Ambulatory Visit (HOSPITAL_BASED_OUTPATIENT_CLINIC_OR_DEPARTMENT_OTHER): Payer: Self-pay

## 2022-05-30 DIAGNOSIS — Z23 Encounter for immunization: Secondary | ICD-10-CM | POA: Diagnosis not present

## 2022-05-30 MED ORDER — FLUAD QUADRIVALENT 0.5 ML IM PRSY
PREFILLED_SYRINGE | INTRAMUSCULAR | 0 refills | Status: DC
  Filled 2022-05-30: qty 0.5, 1d supply, fill #0

## 2022-06-10 ENCOUNTER — Ambulatory Visit (INDEPENDENT_AMBULATORY_CARE_PROVIDER_SITE_OTHER): Payer: Medicare Other | Admitting: *Deleted

## 2022-06-10 DIAGNOSIS — M810 Age-related osteoporosis without current pathological fracture: Secondary | ICD-10-CM

## 2022-06-10 MED ORDER — DENOSUMAB 60 MG/ML ~~LOC~~ SOSY
60.0000 mg | PREFILLED_SYRINGE | Freq: Once | SUBCUTANEOUS | Status: AC
  Administered 2022-06-10: 60 mg via SUBCUTANEOUS

## 2022-06-21 ENCOUNTER — Non-Acute Institutional Stay: Payer: Medicare Other | Admitting: Internal Medicine

## 2022-06-21 ENCOUNTER — Encounter: Payer: Self-pay | Admitting: Internal Medicine

## 2022-06-21 ENCOUNTER — Other Ambulatory Visit: Payer: Self-pay | Admitting: Internal Medicine

## 2022-06-21 VITALS — BP 150/72 | HR 77 | Temp 97.6°F | Resp 18 | Ht 63.0 in | Wt 125.4 lb

## 2022-06-21 DIAGNOSIS — K5901 Slow transit constipation: Secondary | ICD-10-CM | POA: Diagnosis not present

## 2022-06-21 DIAGNOSIS — E034 Atrophy of thyroid (acquired): Secondary | ICD-10-CM | POA: Diagnosis not present

## 2022-06-21 DIAGNOSIS — I5032 Chronic diastolic (congestive) heart failure: Secondary | ICD-10-CM | POA: Diagnosis not present

## 2022-06-21 DIAGNOSIS — R350 Frequency of micturition: Secondary | ICD-10-CM

## 2022-06-21 DIAGNOSIS — J3089 Other allergic rhinitis: Secondary | ICD-10-CM | POA: Diagnosis not present

## 2022-06-21 DIAGNOSIS — Z950 Presence of cardiac pacemaker: Secondary | ICD-10-CM

## 2022-06-21 DIAGNOSIS — R600 Localized edema: Secondary | ICD-10-CM | POA: Diagnosis not present

## 2022-06-21 DIAGNOSIS — M810 Age-related osteoporosis without current pathological fracture: Secondary | ICD-10-CM

## 2022-06-21 DIAGNOSIS — I10 Essential (primary) hypertension: Secondary | ICD-10-CM | POA: Diagnosis not present

## 2022-06-21 MED ORDER — IPRATROPIUM BROMIDE 0.06 % NA SOLN: 1.0000 | Freq: Three times a day (TID) | NASAL | 3 refills | Status: DC

## 2022-06-21 MED ORDER — FLUTICASONE PROPIONATE 50 MCG/ACT NA SUSP: 2.0000 | Freq: Every day | NASAL | 6 refills | Status: DC

## 2022-06-21 NOTE — Patient Instructions (Signed)
Take Miralax Half cup in your coffee for constipation

## 2022-06-22 ENCOUNTER — Encounter: Payer: Medicare Other | Admitting: Internal Medicine

## 2022-06-22 NOTE — Progress Notes (Signed)
Location:  [place] of Service:  Clinic (12)  Provider:   Code Status: DNR Goals of Care:     06/21/2022    1:52 PM  Advanced Directives  Does Patient Have a Medical Advance Directive? Yes  Type of Paramedic of [place];Living will;Out of facility DNR (pink MOST or yellow form)  Copy of [place] in Chart? Yes - validated most recent copy scanned in chart (See row information)     Chief Complaint  Patient presents with   Medical Management of Chronic Issues    4 month follow up   Immunizations    Discussed the need for Covid 19 Booster    HPI: Patient is a 88 y.o. female seen today for medical management of chronic diseases.    Patient has a history of Hypertension Lower extremity edema with varicose veins Takes Lasix as needed due to her incontinence Diarrhea and abdominal cramps Patient said that she has been diagnosed with IBS by Dr. Cristina Gong Urinary incontinence Osteoporosis on Prolia S/p PPM just saw cardiology  Allergic rhinitis Acute issue today.  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 Cristina Gong   High cholesterol    HTN (hypertension) 07/17/2015   Echo 08/2019: EF 38-75, grade 1 diastolic dysfunction, mild apical HK, normal RV SF, trivial pericardial effusion, moderate MAC, mild MR, mild TR   Hypothyroidism    Insomnia    Lactose intolerance    Lumbar degenerative disc disease    Myoview    Myoview 06/2019: EF 48, normal  perfusion; low risk (EF normal by echo 10/2018)   Neuromuscular disorder (HCC)    Pronated ankles /Feet    Onychomycosis    Osteoarthritis    Osteoporosis    Second degree Mobitz II AV block    s/p PPM   Spinal stenosis    Thyroid disease    Varicose veins     Past Surgical History:  Procedure Laterality Date   ABDOMINAL HYSTERECTOMY     BLADDER REPAIR     blephoplasty bilateral  03/10/2014   CATARACT EXTRACTION W/ INTRAOCULAR LENS  IMPLANT, BILATERAL  7 & 8 2014   CHOLECYSTECTOMY     COLONOSCOPY  05/18/2012   EP IMPLANTABLE DEVICE N/A 08/16/2016   MDT Advisa MRI conditional PPM implanted by Dr Rayann Heman for mobitz II second degree AV block   EYE SURGERY Right 05/02/2018   laser eye surgery   herniated disc repair     INCONTINENCE SURGERY     JOINT REPLACEMENT     LAMINECTOMY  02/2011   L3-L5  Dr. Ellene Route   LUMBAR LAMINECTOMY/DECOMPRESSION MICRODISCECTOMY Right 09/21/2015   Procedure: Right Lumbar Three-FourMicrodiskectomy;  Surgeon: Kristeen Miss, MD;  Location: [facility];  Service: Neurosurgery;  Laterality: Right;  Right L3-4 Microdiskectomy   SHOULDER ARTHROSCOPY WITH ROTATOR CUFF REPAIR Right 08/2003   TONSILLECTOMY  TOTAL HIP ARTHROPLASTY     right 1999    Allergies  Allergen Reactions   Codeine Nausea Only   Macrobid [Nitrofurantoin] Diarrhea   Miralax [Polyethylene Glycol] Other (See Comments)    cramping   Other Nausea And Vomiting and Other (See Comments)   Pain Relief [Acetaminophen] Nausea And Vomiting    Patient states she is not allergic    Paxil [Paroxetine Hcl] Nausea Only   Percodan [Oxycodone-Aspirin] Itching   Oxycodone Nausea Only   Oxycodone-Acetaminophen Nausea Only   Penicillins Rash    Has patient had a PCN reaction causing immediate rash, facial/tongue/throat swelling, SOB or lightheadedness with hypotension: YES Has patient had a PCN reaction causing severe rash involving mucus membranes or skin necrosis: YES Has patient had a PCN reaction  that required hospitalization NO Has patient had a PCN reaction occurring within the last 10 years: NO If all of the above answers are "NO", then may proceed with Cephalosporin use.    Outpatient Encounter Medications as of 06/21/2022  Medication Sig   Calcium-Phosphorus-Vitamin D [phone] MG-MG-UNIT CHEW Chew 1 tablet by mouth daily.   diltiazem (CARDIZEM CD) 240 MG 24 hr capsule TAKE ONE CAPSULE BY MOUTH EVERY DAY   dorzolamide-timolol (COSOPT) 22.3-6.8 MG/ML ophthalmic solution Place 1 drop into both eyes 2 (two) times daily.   fluticasone (FLONASE) 50 MCG/ACT nasal spray Place 2 sprays into both nostrils daily.   furosemide (LASIX) 20 MG tablet TAKE ONE TABLET BY MOUTH EVERY OTHER DAY.   influenza vaccine adjuvanted (FLUAD QUADRIVALENT) 0.5 ML injection Inject into the muscle.   irbesartan (AVAPRO) 300 MG tablet TAKE ONE TABLET BY MOUTH DAILY   LACTASE PO Take 1 tablet by mouth as needed (when eating dairy products). Take as directed   Loratadine 10 MG CAPS Take 1 capsule by mouth as needed.   LUMIGAN 0.01 % SOLN Place 1 drop into both eyes at bedtime.   metoprolol succinate (TOPROL-XL) 25 MG 24 hr tablet Take 0.5 tablets (12.5 mg total) by mouth at bedtime.   Multiple Vitamins-Minerals (MULTIVITAMIN WITH MINERALS) tablet Take 1 tablet by mouth daily.   Netarsudil Dimesylate 0.02 % SOLN Apply 1 drop to eye at bedtime. Right eye   Probiotic Product (ALIGN) 4 MG CAPS Take by mouth daily.   rosuvastatin (CRESTOR) 5 MG tablet TAKE ONE TABLET BY MOUTH ONCE DAILY CHOLESTEROL   SYNTHROID 125 MCG tablet TAKE ONE TABLET BY MOUTH ONCE DAILY   UNABLE TO FIND Take by mouth as needed. Med Name: OTC purified peppermint oil for IBS - 3 caps before lunch and dinner   UNABLE TO FIND Med Name:Hemeopathic ear drops  2 drops as needed   Vibegron (GEMTESA) 75 MG TABS Take 1 tablet by mouth daily.   [DISCONTINUED] ipratropium (ATROVENT) 0.06 % nasal spray USE TWO SPRAYS IN EACH NOSTRIL THREE TIMES DAILY  AS NEEDED   ipratropium (ATROVENT) 0.06 % nasal spray Place 1 spray into both nostrils 3 (three) times daily.   [DISCONTINUED] ipratropium (ATROVENT) 0.06 % nasal spray USE TWO SPRAYS IN EACH NOSTRIL THREE TIMES DAILY AS NEEDED   No facility-administered encounter medications on file as of 06/21/2022.    Review of Systems:  Review of Systems  Constitutional:  Negative for activity change and appetite change.  HENT: Negative.    Respiratory:  Negative for cough and shortness of breath.   Cardiovascular:  Positive for leg swelling.  Gastrointestinal:  Positive for constipation.  Genitourinary:  Positive for frequency and urgency.  Musculoskeletal:  Positive for gait problem. Negative for arthralgias and myalgias.  Skin: Negative.   Neurological:  Negative for dizziness and weakness.  Psychiatric/Behavioral:  Negative for confusion, dysphoric mood and sleep disturbance.     Health Maintenance  Topic Date Due   COVID-19 Vaccine (5 - Moderna risk series) 12/03/2021   Pneumonia Vaccine 12+ Years old (2 - PCV) 07/27/2023 (Originally 07/15/2015)   Medicare Annual Wellness (AWV)  09/08/2022   TETANUS/TDAP  05/10/2031   INFLUENZA VACCINE  Completed   DEXA SCAN  Completed   Zoster Vaccines- Shingrix  Completed   HPV VACCINES  Aged Out    Physical Exam: Vitals:   06/21/22 1345  BP: (!) 150/72  Pulse: 77  Resp: 18  Temp: 97.6 F (36.4 C)  TempSrc: Temporal  SpO2: 95%  Weight: 125 lb 6.4 oz (56.9 kg)  Height: _0  (1.6 m)   Body mass index is 22.21 kg/m. Physical Exam Vitals reviewed.  Constitutional:      Appearance: Normal appearance.  HENT:     Head: Normocephalic.     Nose: Nose normal.     Mouth/Throat:     Mouth: Mucous membranes are moist.     Pharynx: Oropharynx is clear.  Eyes:     Pupils: Pupils are equal, round, and reactive to light.  Neck:     Vascular: Carotid bruit present.     Comments: Right carotis bruit Cardiovascular:     Rate and Rhythm: Normal  rate and regular rhythm.     Pulses: Normal pulses.     Heart sounds: Normal heart sounds. No murmur heard. Pulmonary:     Effort: Pulmonary effort is normal.     Breath sounds: Normal breath sounds.  Abdominal:     General: Abdomen is flat. Bowel sounds are normal.     Palpations: Abdomen is soft.  Musculoskeletal:        General: Swelling present.     Cervical back: Neck supple.  Skin:    General: Skin is warm.  Neurological:     General: No focal deficit present.     Mental Status: She is alert and oriented to person, place, and time.  Psychiatric:        Mood and Affect: Mood normal.        Thought Content: Thought content normal.     Labs reviewed: Basic Metabolic Panel: Recent Labs    07/20/21 0000 10/12/21 0000  NA 143 145  K 3.8 4.1  CL 109* 111*  CO2 25* 24*  BUN 13 17  CREATININE 0.5 0.7  CALCIUM 10.0 9.9  TSH  --  0.79   Liver Function Tests: No results for input(s): "AST", "ALT", "ALKPHOS", "BILITOT", "PROT", "ALBUMIN" in the last 8760 hours. No results for input(s): "LIPASE", "AMYLASE" in the last 8760 hours. No results for input(s): "AMMONIA" in the last 8760 hours. CBC: Recent Labs    10/12/21 0000  WBC 6.9  HGB 12.7  HCT 40  PLT 230   Lipid Panel: Recent Labs    10/12/21 0000  CHOL 145  HDL 70  LDLCALC 53  TRIG 113   Lab Results  Component Value Date   HGBA1C 5.8 10/12/2021    Procedures since last visit: No results found.  Assessment/Plan 1. Essential hypertension Doing well Diltiazem, Avapro and Metoprolol  2. Hypothyroidism due to acquired atrophy of thyroid TSH normal in 02/23  3. HLD statin 4. Chronic diastolic congestive heart failure (HCC) Takes lasix PRN  5. Slow transit constipation  Can use Miralax as needed  6. Non-seasonal allergic rhinitis due to other allergic trigger Atrovent  Add Flonase  7. Senile osteoporosis Prolia   9. S/P placement of cardiac pacemaker  10 Carotid Bruit On statin Cant  do aspirin due to GI bleed 11 Urinary Incontinence Gemtesa   Labs/tests ordered:  CBC,CMP,LIPid TSh Next appt:  09/19/2022

## 2022-06-30 DIAGNOSIS — N390 Urinary tract infection, site not specified: Secondary | ICD-10-CM | POA: Diagnosis not present

## 2022-07-07 DIAGNOSIS — L820 Inflamed seborrheic keratosis: Secondary | ICD-10-CM | POA: Diagnosis not present

## 2022-07-07 DIAGNOSIS — L821 Other seborrheic keratosis: Secondary | ICD-10-CM | POA: Diagnosis not present

## 2022-07-07 DIAGNOSIS — L720 Epidermal cyst: Secondary | ICD-10-CM | POA: Diagnosis not present

## 2022-07-07 DIAGNOSIS — L570 Actinic keratosis: Secondary | ICD-10-CM | POA: Diagnosis not present

## 2022-07-07 DIAGNOSIS — R609 Edema, unspecified: Secondary | ICD-10-CM | POA: Diagnosis not present

## 2022-07-07 DIAGNOSIS — L578 Other skin changes due to chronic exposure to nonionizing radiation: Secondary | ICD-10-CM | POA: Diagnosis not present

## 2022-07-07 DIAGNOSIS — D225 Melanocytic nevi of trunk: Secondary | ICD-10-CM | POA: Diagnosis not present

## 2022-07-07 DIAGNOSIS — D2230 Melanocytic nevi of unspecified part of face: Secondary | ICD-10-CM | POA: Diagnosis not present

## 2022-07-07 DIAGNOSIS — Z85828 Personal history of other malignant neoplasm of skin: Secondary | ICD-10-CM | POA: Diagnosis not present

## 2022-07-18 ENCOUNTER — Ambulatory Visit (INDEPENDENT_AMBULATORY_CARE_PROVIDER_SITE_OTHER): Payer: Medicare Other

## 2022-07-18 DIAGNOSIS — I441 Atrioventricular block, second degree: Secondary | ICD-10-CM | POA: Diagnosis not present

## 2022-07-19 LAB — CUP PACEART REMOTE DEVICE CHECK
Battery Remaining Longevity: 50 mo
Battery Voltage: 2.98 V
Brady Statistic AP VP Percent: 13.04 %
Brady Statistic AP VS Percent: 0.01 %
Brady Statistic AS VP Percent: 86.83 %
Brady Statistic AS VS Percent: 0.12 %
Brady Statistic RA Percent Paced: 13.02 %
Brady Statistic RV Percent Paced: 99.76 %
Date Time Interrogation Session: 20231120134935
Implantable Lead Connection Status: 753985
Implantable Lead Connection Status: 753985
Implantable Lead Implant Date: 20171219
Implantable Lead Implant Date: 20171219
Implantable Lead Location: 753859
Implantable Lead Location: 753860
Implantable Lead Model: 5076
Implantable Lead Model: 5076
Implantable Pulse Generator Implant Date: 20171219
Lead Channel Impedance Value: 1083 Ohm
Lead Channel Impedance Value: 1159 Ohm
Lead Channel Impedance Value: 285 Ohm
Lead Channel Impedance Value: 342 Ohm
Lead Channel Pacing Threshold Amplitude: 0.625 V
Lead Channel Pacing Threshold Amplitude: 1.5 V
Lead Channel Pacing Threshold Pulse Width: 0.4 ms
Lead Channel Pacing Threshold Pulse Width: 0.4 ms
Lead Channel Sensing Intrinsic Amplitude: 3.375 mV
Lead Channel Sensing Intrinsic Amplitude: 3.375 mV
Lead Channel Sensing Intrinsic Amplitude: 7.375 mV
Lead Channel Sensing Intrinsic Amplitude: 7.375 mV
Lead Channel Setting Pacing Amplitude: 2 V
Lead Channel Setting Pacing Amplitude: 3 V
Lead Channel Setting Pacing Pulse Width: 0.4 ms
Lead Channel Setting Sensing Sensitivity: 5.6 mV
Zone Setting Status: 755011
Zone Setting Status: 755011

## 2022-08-02 ENCOUNTER — Other Ambulatory Visit: Payer: Self-pay | Admitting: Internal Medicine

## 2022-08-24 ENCOUNTER — Other Ambulatory Visit: Payer: Self-pay | Admitting: Internal Medicine

## 2022-09-01 NOTE — Progress Notes (Signed)
Remote pacemaker transmission.   

## 2022-09-18 NOTE — Progress Notes (Signed)
Subjective:   Destiny [name] is a 89 y.o. female who presents for Medicare Annual (Subsequent) preventive examination at [facility].   Review of Systems     Cardiac Risk Factors include: advanced age (>62mn, >>48women);sedentary lifestyle;hypertension     Objective:    Today's Vitals   09/19/22 1550  BP: 130/78  Pulse: 90  Resp: 18  Temp: 97.6 F (36.4 C)  TempSrc: Temporal  SpO2: 94%  Weight: 123 lb 9.6 oz (56.1 kg)  Height: _0  (1.6 m)   Body mass index is 21.89 kg/m.     09/19/2022    4:06 PM 09/19/2022    3:39 PM 06/21/2022    1:52 PM 02/11/2022    4:20 PM 10/20/2021   11:05 AM 08/09/2021    4:04 PM 05/09/2021    4:01 PM  Advanced Directives  Does Patient Have a Medical Advance Directive? _1  Yes Yes  Type of Advance Directive Out of facility DNR (pink MOST or yellow form);[place] will [place] will;Out of facility DNR (pink MOST or yellow form) [place] will;Out of facility DNR (pink MOST or yellow form) Healthcare Power of Attorney Living will;Healthcare Power of AGertyLiving will;Out of facility DNR (pink MOST or yellow form) Living will  Does patient want to make changes to medical advance directive?  No - Patient declined  No - Patient declined No - Patient declined No - Patient declined   Copy of [place] Chart? Yes - validated most recent copy scanned in chart (See row information)  Yes - validated most recent copy scanned in chart (See row information) Yes - validated most recent copy scanned in chart (See row information) Yes - validated most recent copy scanned in chart (See row information) Yes - validated most recent copy scanned in chart (See row information)   Pre-existing out of facility DNR order (yellow form or pink MOST form) Yellow form placed in chart (order not valid for  inpatient use)          Current Medications (verified) Outpatient Encounter Medications as of 09/19/2022  Medication Sig   Calcium-Phosphorus-Vitamin D 250-107-500 MG-MG-UNIT CHEW Chew 1 tablet by mouth daily.   diltiazem (CARDIZEM CD) 240 MG 24 hr capsule TAKE ONE CAPSULE BY MOUTH EVERY DAY   dorzolamide-timolol (COSOPT) 22.3-6.8 MG/ML ophthalmic solution Place 1 drop into both eyes 2 (two) times daily.   fluticasone (FLONASE) 50 MCG/ACT nasal spray Place 2 sprays into both nostrils daily.   furosemide (LASIX) 20 MG tablet TAKE ONE TABLET BY MOUTH EVERY OTHER DAY.   influenza vaccine adjuvanted (FLUAD QUADRIVALENT) 0.5 ML injection Inject into the muscle.   ipratropium (ATROVENT) 0.06 % nasal spray Place 1 spray into both nostrils 3 (three) times daily.   irbesartan (AVAPRO) 300 MG tablet TAKE ONE TABLET BY MOUTH DAILY   LACTASE PO Take 1 tablet by mouth as needed (when eating dairy products). Take as directed   Loratadine 10 MG CAPS Take 1 capsule by mouth as needed.   LUMIGAN 0.01 % SOLN Place 1 drop into both eyes at bedtime.   metoprolol succinate (TOPROL-XL) 25 MG 24 hr tablet Take 0.5 tablets (12.5 mg total) by mouth at bedtime.   Multiple Vitamins-Minerals (MULTIVITAMIN WITH MINERALS) tablet Take 1 tablet by mouth daily.   Netarsudil Dimesylate 0.02 % SOLN Apply 1 drop to eye at bedtime. Right  eye   Probiotic Product (ALIGN) 4 MG CAPS Take by mouth daily.   rosuvastatin (CRESTOR) 5 MG tablet TAKE ONE TABLET BY MOUTH ONCE DAILY CHOLESTEROL   SYNTHROID 125 MCG tablet TAKE ONE TABLET BY MOUTH ONCE DAILY   UNABLE TO FIND Take by mouth as needed. Med Name: OTC purified peppermint oil for IBS - 3 caps before lunch and dinner   UNABLE TO FIND Med Name:Hemeopathic ear drops  2 drops as needed   Vibegron (GEMTESA) 75 MG TABS Take 1 tablet by mouth daily.   No facility-administered encounter medications on file as of 09/19/2022.    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 Cristina Gong   High cholesterol    HTN (hypertension) 07/17/2015   Echo 08/2019: EF 83-41, grade 1 diastolic dysfunction, mild apical HK, normal RV SF, trivial pericardial effusion, moderate MAC, mild MR, mild TR   Hypothyroidism    Insomnia    Lactose intolerance    Lumbar degenerative disc disease    Myoview    Myoview 06/2019: EF 48, normal perfusion; low risk (EF normal by echo 10/2018)   Neuromuscular disorder (HCC)    Pronated ankles /Feet    Onychomycosis    Osteoarthritis    Osteoporosis    Second degree Mobitz II AV block    s/p PPM   Spinal stenosis    Thyroid disease    Varicose veins    Past Surgical History:  Procedure Laterality Date   ABDOMINAL HYSTERECTOMY     BLADDER REPAIR     blephoplasty bilateral  03/10/2014   CATARACT EXTRACTION W/ INTRAOCULAR LENS  IMPLANT, BILATERAL  7 & 8 2014   CHOLECYSTECTOMY     COLONOSCOPY  05/18/2012   EP IMPLANTABLE DEVICE N/A 08/16/2016   MDT Advis[employer] MRI conditional PPM implanted by Dr Rayann Heman for mobitz II second degree AV block   EYE SURGERY Right 05/02/2018   laser eye surgery   herniated disc repair     INCONTINENCE SURGERY     JOINT REPLACEMENT     LAMINECTOMY  02/2011   L3-L5  Dr. Ellene Route   LUMBAR LAMINECTOMY/DECOMPRESSION MICRODISCECTOMY Right 09/21/2015   Procedure: Right Lumbar Three-FourMicrodiskectomy;  Surgeon: Kristeen Miss, MD;  Location: [facility];  Service: Neurosurgery;   Laterality: Right;  Right L3-4 Microdiskectomy   SHOULDER ARTHROSCOPY WITH ROTATOR CUFF REPAIR Right 08/2003   TONSILLECTOMY     TOTAL HIP ARTHROPLASTY     right 1999   Family History  Problem Relation Age of Onset   Arthritis Mother    Osteoporosis Mother    Heart attack Father    Social History   Socioeconomic History   Marital status: Widowed    Spouse name: Not on file   Number of children: Not on file   Years of education: Not on file   Highest education level: Not on file  Occupational History   Not on file  Tobacco Use  Smoking status: Never   Smokeless tobacco: Never  Vaping Use   Vaping Use: Never used  Substance and Sexual Activity   Alcohol use: No   Drug use: No   Sexual activity: Not Currently  Other Topics Concern   Not on file  Social History Narrative   Lives at [facility]   Caffeine 1-2 cups daily   Exercise water aerobics, walking   Never smoked   POA               Do you drink/ eat things with caffeine? Yes      Marital status:    Widow                           What year were you married ? 1957      Do you live in a house, apartment,assistred living, condo, trailer, etc.)?[facility]      Is it one or more stories?       How many persons live in your home ? 1      Do you have any pets in your home ?(please list)0       Current or past profession: Social Worker      Do you exercise?  Yes                            Type & how often:  Water Aerobic, 3-4 hours per week       Do you have a living will? Yes      Do you have a DNR form? Yes                       If not, do you want to discuss one?       Do you have signed POA?HPOA forms?                 If so, please bring to your        appointment         Social Determinants of Health   Financial Resource Strain: Low Risk  (05/08/2018)   Overall Financial Resource Strain (CARDIA)    Difficulty of Paying Living Expenses: Not hard at all  Food  Insecurity: No Food Insecurity (05/08/2018)   Hunger Vital Sign    Worried About Running Out of Food in the Last Year: Never true    Ran Out of Food in the Last Year: Never true  Transportation Needs: No Transportation Needs (05/08/2018)   PRAPARE - Hydrologist (Medical): No    Lack of Transportation (Non-Medical): No  Physical Activity: Insufficiently Active (05/08/2018)   Exercise Vital Sign    Days of Exercise per Week: 3 days    Minutes of Exercise per Session: 40 min  Stress: No Stress Concern Present (05/08/2018)   [place]    Feeling of Stress : Only a little  Social Connections: Moderately Isolated (05/08/2018)   Social Connection and Isolation Panel [NHANES]    Frequency of Communication with Friends and Family: More than three times a week    Frequency of Social Gatherings with Friends and Family: More than three times a week    Attends Religious Services: Never    Marine scientist or Organizations: No    Attends Archivist  Meetings: Never    Marital Status: Widowed    Tobacco Counseling Counseling given: Not Answered   Clinical Intake:  Pre-visit preparation completed: No  Pain : No/denies pain     BMI - recorded: 21 Nutritional Status: BMI of 19-24  Normal Nutritional Risks: None Diabetes: No  How often do you need to have someone help you when you read instructions, pamphlets, or other written materials from your doctor or pharmacy?: 1 - Never What is the last grade level you completed in school?: Master's degree  Diabetic?no  Interpreter Needed?: No  Information entered by :: Royal Hawthorn NP   Activities of Daily Living    09/19/2022    4:07 PM 09/19/2022    3:40 PM  In your present state of health, do you have any difficulty performing the following activities:  Hearing? 0 0  Vision? 1 1  Difficulty concentrating or making decisions? 0  0  Walking or climbing stairs? 1 1  Dressing or bathing? 0 0  Doing errands, shopping? 0 0  Preparing Food and eating ? N   Using the Toilet? N   In the past six months, have you accidently leaked urine? Y   Do you have problems with loss of bowel control? Y   Managing your Medications? N   Managing your Finances? N   Housekeeping or managing your Housekeeping? N     Patient Care Team: Virgie Dad, MD as PCP - General (Internal Medicine) Thompson Grayer, MD as PCP - Electrophysiology (Cardiology) Deland Pretty, MD (Internal Medicine) Ronald Lobo, MD as Consulting Physician (Gastroenterology) Jari Pigg, MD as Consulting Physician (Dermatology) Shon Hough, MD as Consulting Physician (Ophthalmology) Kristeen Miss, MD as Consulting Physician (Neurosurgery) Justice Britain, MD as Consulting Physician (Orthopedic Surgery) Gaynelle Arabian, MD as Consulting Physician (Orthopedic Surgery) Suella Broad, MD as Consulting Physician (Physical Medicine and Rehabilitation)  Indicate any recent Medical Services you may have received from other than [facility] providers in the past year (date may be approximate).     Assessment:   This is a routine wellness examination for Destiny [name].  Hearing/Vision screen Hearing Screening - Comments:: Not in last 12 months Vision Screening - Comments:: She has one scheduled soon  Dietary issues and exercise activities discussed: Current Exercise Habits: The patient does not participate in regular exercise at present, Exercise limited by: orthopedic condition(s)   Goals Addressed             This Visit's Progress    Maintain Mobility and Function       Evidence-based guidance:  Emphasize the importance of physical activity and aerobic exercise as included in treatment plan; assess barriers to adherence; consider patient's abilities and preferences.  Encourage gradual increase in activity or exercise instead of stopping if pain occurs.   Reinforce individual therapy exercise prescription, such as strengthening, stabilization and stretching programs.  Promote optimal body mechanics to stabilize the spine with lifting and functional activity.  Encourage activity and mobility modifications to facilitate optimal function, such as using a log roll for bed mobility or dressing from a seated position.  Reinforce individual adaptive equipment recommendations to limit excessive spinal movements, such as a Systems analyst.  Assess adequacy of sleep; encourage use of sleep hygiene techniques, such as bedtime routine; use of white noise; dark, cool bedroom; avoiding daytime naps, heavy meals or exercise before bedtime.  Promote positions and modification to optimize sleep and sexual activity; consider pillows or positioning devices to assist in maintaining neutral spine.  Explore  options for applying ergonomic principles at work and home, such as frequent position changes, using ergonomically designed equipment and working at optimal height.  Promote modifications to increase comfort with driving such as lumbar support, optimizing seat and steering wheel position, using cruise control and taking frequent rest stops to stretch and walk.   Notes:        Depression Screen    09/19/2022    4:05 PM 09/19/2022    3:39 PM 06/21/2022    1:51 PM 02/11/2022    4:20 PM 08/09/2021    4:15 PM 07/15/2020    1:48 PM 06/30/2020    2:37 PM  PHQ 2/9 Scores  PHQ - 2 Score 1 0 0 0 0 0 0    Fall Risk    09/19/2022    4:05 PM 09/19/2022    3:39 PM 06/21/2022    1:51 PM 02/11/2022    4:20 PM 10/20/2021   11:05 AM  [place] in the past year? 0 0 0 0 0  Number falls in past yr: 0 0 0 0 0  Injury with Fall? 0 0 0 0 0  Risk for fall due to : No Fall Risks Impaired mobility History of fall(s);Impaired balance/gait;Impaired mobility Impaired balance/gait Impaired balance/gait  Follow up Falls evaluation completed Falls evaluation completed  Falls evaluation completed Falls evaluation completed Falls evaluation completed    FALL RISK PREVENTION PERTAINING TO THE HOME:  Any stairs in or around the home? No  If so, are there any without handrails? No  Home free of loose throw rugs in walkways, pet beds, electrical cords, etc? Yes  Adequate lighting in your home to reduce risk of falls? Yes   ASSISTIVE DEVICES UTILIZED TO PREVENT FALLS:  Life alert? No  Use of a cane, walker or w/c? Yes  Grab bars in the bathroom? Yes  Shower chair or bench in shower? Yes  Elevated toilet seat or a handicapped toilet? No   TIMED UP AND GO:  Was the test performed? Yes .  Length of time to ambulate 10 feet: 8 sec.   Gait steady and fast with assistive device  Cognitive Function:    09/19/2022    3:41 PM 05/08/2018    3:15 PM 03/28/2017    3:03 PM  MMSE - Mini Mental State Exam  Orientation to time _0 Orientation to Place _1 Registration _2 Attention/ Calculation _3 Recall _4 Language- name 2 objects _5 Language- repeat _6 Language- follow 3 step command _7 Language- read & follow direction _8 Write a sentence _9 Copy design _10 Total score _11 08/09/2021    4:16 PM 06/30/2020    2:38 PM 06/26/2019    2:28 PM  6CIT Screen  What Year? 0 points 0 points 0 points  What month? 0 points 0 points 0 points  What time? 0 points 0 points 0 points  Count back from 20 0 points 0 points 0 points  Months in reverse 0 points 0 points 0 points  Repeat phrase 0 points 0 points 0 points  Total Score 0 points 0 points 0 points    Immunizations Immunization History  Administered Date(s) Administered   DTaP 06/19/2012   Fluad Quad(high Dose 65+) 05/30/2022   Influenza,  High Dose Seasonal PF 06/26/2020   Influenza,inj,Quad PF,6+ Mos 05/12/2016, 06/22/2018, 05/07/2019   Influenza-Unspecified 08/29/2014, 06/19/2017, 06/26/2020   Moderna Covid-19 Vaccine Bivalent Booster 27yr &  up 10/08/2021   Moderna Sars-Covid-2 Vaccination 09/07/2019, 10/10/2019, 07/11/2020   Pneumococcal Polysaccharide-23 12/11/1999   Pneumococcal-Unspecified 08/29/2013, 07/14/2014   Td 04/07/2010, 06/19/2012   Tdap 05/09/2021   Zoster Recombinat (Shingrix) 03/30/2017, 06/16/2017   Zoster, Live 06/24/2006    TDAP status: Up to date  Flu Vaccine status: Up to date  Pneumococcal vaccine status: Up to date  Covid-19 vaccine status: Information provided on how to obtain vaccines.   Qualifies for Shingles Vaccine? Yes   Zostavax completed Yes   Shingrix Completed?: Yes  Screening Tests Health Maintenance  Topic Date Due   COVID-19 Vaccine (5 - 2023-24 season) 04/29/2022   Pneumonia Vaccine 89 Years old (2 - PCV) 07/27/2023 (Originally 07/15/2015)   Medicare Annual Wellness (AWV)  09/20/2023   DTaP/Tdap/Td (5 - Td or Tdap) 05/10/2031   INFLUENZA VACCINE  Completed   DEXA SCAN  Completed   Zoster Vaccines- Shingrix  Completed   HPV VACCINES  Aged Out    Health Maintenance  Health Maintenance Due  Topic Date Due   COVID-19 Vaccine (5 - 2023-24 season) 04/29/2022    Colorectal cancer screening: No longer required.   Mammogram status: No longer required due to age.  Bone Density status: Completed declined. Results reflect: Bone density results: OSTEOPENIA. Repeat every na  years.  Lung Cancer Screening: (Low Dose CT Chest recommended if Age 87-80years, 30 pack-year currently smoking OR have quit w/in 15years.) does not qualify.   Lung Cancer Screening Referral: na  Additional Screening:  Hepatitis C Screening: does not qualify; Completed na  Vision Screening: Recommended annual ophthalmology exams for early detection of glaucoma and other disorders of the eye. Is the patient up to date with their annual eye exam?  Yes  Who is the provider or what is the name of the office in which the patient attends annual eye exams? [facility] If pt is not established with a  provider, would they like to be referred to a provider to establish care? No .   Dental Screening: Recommended annual dental exams for proper oral hygiene  Community Resource Referral / Chronic Care Management: CRR required this visit?  No   CCM required this visit?  No      Plan:     I have personally reviewed and noted the following in the patient's chart:   Medical and social history Use of alcohol, tobacco or illicit drugs  Current medications and supplements including opioid prescriptions. Patient is not currently taking opioid prescriptions. Functional ability and status Nutritional status Physical activity Advanced directives List of other physicians Hospitalizations, surgeries, and ER visits in previous 12 months Vitals Screenings to include cognitive, depression, and falls Referrals and appointments  In addition, I have reviewed and discussed with patient certain preventive protocols, quality metrics, and best practice recommendations. A written personalized care plan for preventive services as well as general preventive health recommendations were provided to patient.     CRoyal Hawthorn NP   09/19/2022   Nurse Notes: na

## 2022-09-19 ENCOUNTER — Ambulatory Visit: Payer: Medicare Other | Admitting: Adult Health

## 2022-09-19 ENCOUNTER — Encounter: Payer: Self-pay | Admitting: Adult Health

## 2022-09-19 VITALS — BP 130/78 | HR 90 | Temp 97.6°F | Resp 18 | Ht 63.0 in | Wt 123.6 lb

## 2022-09-19 DIAGNOSIS — Z0000 Encounter for general adult medical examination without abnormal findings: Secondary | ICD-10-CM

## 2022-09-19 NOTE — Patient Instructions (Signed)
[name] , Thank you for taking time to come for your Medicare Wellness Visit. I appreciate your ongoing commitment to your health goals. Please review the following plan we discussed and let me know if I can assist you in the future.   Screening recommendations/referrals: Colonoscopy aged out Mammogram aged out Bone Density declined Recommended yearly ophthalmology/optometry visit for glaucoma screening and checkup Recommended yearly dental visit for hygiene and checkup  Vaccinations: Influenza vaccine- due annually in September/October Pneumococcal vaccine up to date Tdap vaccine up to date Shingles vaccine up to date    Recommend covid vaccine and RSV vaccine  Advanced directives: reviewed   Conditions/risks identified: NA  Next appointment: 1 year   Preventive Care 45 Years and Older, Female Preventive care refers to lifestyle choices and visits with your health care provider that can promote health and wellness. What does preventive care include? A yearly physical exam. This is also called an annual well check. Dental exams once or twice a year. Routine eye exams. Ask your health care provider how often you should have your eyes checked. Personal lifestyle choices, including: Daily care of your teeth and gums. Regular physical activity. Eating a healthy diet. Avoiding tobacco and drug use. Limiting alcohol use. Practicing safe sex. Taking low-dose aspirin every day. Taking vitamin and mineral supplements as recommended by your health care provider. What happens during an annual well check? The services and screenings done by your health care provider during your annual well check will depend on your age, overall health, lifestyle risk factors, and family history of disease. Counseling  Your health care provider may ask you questions about your: Alcohol use. Tobacco use. Drug use. Emotional well-being. Home and relationship well-being. Sexual activity. Eating  habits. History of falls. Memory and ability to understand (cognition). Work and work Statistician. Reproductive health. Screening  You may have the following tests or measurements: Height, weight, and BMI. Blood pressure. Lipid and cholesterol levels. These may be checked every 5 years, or more frequently if you are over 33 years old. Skin check. Lung cancer screening. You may have this screening every year starting at age 65 if you have a 30-pack-year history of smoking and currently smoke or have quit within the past 15 years. Fecal occult blood test (FOBT) of the stool. You may have this test every year starting at age 55. Flexible sigmoidoscopy or colonoscopy. You may have a sigmoidoscopy every 5 years or a colonoscopy every 10 years starting at age 15. Hepatitis C blood test. Hepatitis B blood test. Sexually transmitted disease (STD) testing. Diabetes screening. This is done by checking your blood sugar (glucose) after you have not eaten for a while (fasting). You may have this done every 1-3 years. Bone density scan. This is done to screen for osteoporosis. You may have this done starting at age 14. Mammogram. This may be done every 1-2 years. Talk to your health care provider about how often you should have regular mammograms. Talk with your health care provider about your test results, treatment options, and if necessary, the need for more tests. Vaccines  Your health care provider may recommend certain vaccines, such as: Influenza vaccine. This is recommended every year. Tetanus, diphtheria, and acellular pertussis (Tdap, Td) vaccine. You may need a Td booster every 10 years. Zoster vaccine. You may need this after age 73. Pneumococcal 13-valent conjugate (PCV13) vaccine. One dose is recommended after age 10. Pneumococcal polysaccharide (PPSV23) vaccine. One dose is recommended after age 18. Talk to your  health care provider about which screenings and vaccines you need and how  often you need them. This information is not intended to replace advice given to you by your health care provider. Make sure you discuss any questions you have with your health care provider. Document Released: 09/11/2015 Document Revised: 05/04/2016 Document Reviewed: 06/16/2015 Elsevier Interactive Patient Education  2017 [place] Prevention in the Home Falls can cause injuries. They can happen to people of all ages. There are many things you can do to make your home safe and to help prevent falls. What can I do on the outside of my home? Regularly fix the edges of walkways and driveways and fix any cracks. Remove anything that might make you trip as you walk through a door, such as a raised step or threshold. Trim any bushes or trees on the path to your home. Use bright outdoor lighting. Clear any walking paths of anything that might make someone trip, such as rocks or tools. Regularly check to see if handrails are loose or broken. Make sure that both sides of any steps have handrails. Any raised decks and porches should have guardrails on the edges. Have any leaves, snow, or ice cleared regularly. Use sand or salt on walking paths during winter. Clean up any spills in your garage right away. This includes oil or grease spills. What can I do in the bathroom? Use night lights. Install grab bars by the toilet and in the tub and shower. Do not use towel bars as grab bars. Use non-skid mats or decals in the tub or shower. If you need to sit down in the shower, use a plastic, non-slip stool. Keep the floor dry. Clean up any water that spills on the floor as soon as it happens. Remove soap buildup in the tub or shower regularly. Attach bath mats securely with double-sided non-slip rug tape. Do not have throw rugs and other things on the floor that can make you trip. What can I do in the bedroom? Use night lights. Make sure that you have a light by your bed that is easy to  reach. Do not use any sheets or blankets that are too big for your bed. They should not hang down onto the floor. Have a firm chair that has side arms. You can use this for support while you get dressed. Do not have throw rugs and other things on the floor that can make you trip. What can I do in the kitchen? Clean up any spills right away. Avoid walking on wet floors. Keep items that you use a lot in easy-to-reach places. If you need to reach something above you, use a strong step stool that has a grab bar. Keep electrical cords out of the way. Do not use floor polish or wax that makes floors slippery. If you must use wax, use non-skid floor wax. Do not have throw rugs and other things on the floor that can make you trip. What can I do with my stairs? Do not leave any items on the stairs. Make sure that there are handrails on both sides of the stairs and use them. Fix handrails that are broken or loose. Make sure that handrails are as long as the stairways. Check any carpeting to make sure that it is firmly attached to the stairs. Fix any carpet that is loose or worn. Avoid having throw rugs at the top or bottom of the stairs. If you do have throw rugs, attach them  to the floor with carpet tape. Make sure that you have a light switch at the top of the stairs and the bottom of the stairs. If you do not have them, ask someone to add them for you. What else can I do to help prevent falls? Wear shoes that: Do not have high heels. Have rubber bottoms. Are comfortable and fit you well. Are closed at the toe. Do not wear sandals. If you use a stepladder: Make sure that it is fully opened. Do not climb a closed stepladder. Make sure that both sides of the stepladder are locked into place. Ask someone to hold it for you, if possible. Clearly mark and make sure that you can see: Any grab bars or handrails. First and last steps. Where the edge of each step is. Use tools that help you move  around (mobility aids) if they are needed. These include: Canes. Walkers. Scooters. Crutches. Turn on the lights when you go into a dark area. Replace any light bulbs as soon as they burn out. Set up your furniture so you have a clear path. Avoid moving your furniture around. If any of your floors are uneven, fix them. If there are any pets around you, be aware of where they are. Review your medicines with your doctor. Some medicines can make you feel dizzy. This can increase your chance of falling. Ask your doctor what other things that you can do to help prevent falls. This information is not intended to replace advice given to you by your health care provider. Make sure you discuss any questions you have with your health care provider. Document Released: 06/11/2009 Document Revised: 01/21/2016 Document Reviewed: 09/19/2014 Elsevier Interactive Patient Education  2017 [employer].

## 2022-09-21 ENCOUNTER — Ambulatory Visit: Payer: Medicare Other | Admitting: Obstetrics and Gynecology

## 2022-09-22 ENCOUNTER — Other Ambulatory Visit: Payer: Self-pay | Admitting: Obstetrics and Gynecology

## 2022-09-27 ENCOUNTER — Ambulatory Visit (INDEPENDENT_AMBULATORY_CARE_PROVIDER_SITE_OTHER): Payer: Medicare Other | Admitting: Obstetrics and Gynecology

## 2022-09-27 ENCOUNTER — Encounter: Payer: Self-pay | Admitting: Obstetrics and Gynecology

## 2022-09-27 VITALS — BP 147/92 | HR 75

## 2022-09-27 DIAGNOSIS — N3281 Overactive bladder: Secondary | ICD-10-CM

## 2022-09-27 MED ORDER — GEMTESA 75 MG PO TABS: 1.0000 | ORAL_TABLET | Freq: Every day | ORAL | 11 refills | Status: DC

## 2022-09-27 NOTE — Patient Instructions (Signed)
For constipation and bowel leakage, start taking psyllium fiber supplement (metamucil) daily to help prevent stools from getting too hard or too loose. Recommend building up to full dose to help prevent stomach upset. Can also consider pelvic physical therapy.

## 2022-09-27 NOTE — Progress Notes (Signed)
[facility] Return Visit  SUBJECTIVE  History of Present Illness: Destiny [name] is a 89 y.o. female seen in follow-up for overactive bladder. She is currently on Gemtesa '75mg'$ . She does not feel she had any benefit from physical therapy.   Just in the last few weeks, she has noticed she has to wake up twice at night and previously was not waking at all. Sometimes waking up from shoulder pain (takes tylenol). Denies burning with urination. In November, she had a urinary tract infection, diagnosed by urgent care. Not having leakage as long as she is going regularly. She is drinking water, one cup coffee in AM, sometimes will have a cup of coffee with dinner around 5pm.   Has noticed a small amount of bowel leakage on her pads. Also still having constipation. She is taking a stool softener. Has used miralax in the past but caused some stomach upset.   Past Medical History: Patient  has a past medical history of Allergy, Arthritis, Bilateral carotid bruits (03/2003), Cataract, Chronic anxiety, Constipation, Diverticulosis of sigmoid colon, Emphysema of lung ([place]), GERD (gastroesophageal reflux disease), Glaucoma, H/O adenomatous polyp of colon, High cholesterol, HTN (hypertension) (07/17/2015), Hypothyroidism, Insomnia, Lactose intolerance, Lumbar degenerative disc disease, Myoview, Neuromuscular disorder ([place]), Onychomycosis, Osteoarthritis, Osteoporosis, Second degree Mobitz II AV block, Spinal stenosis, Thyroid disease, and Varicose veins.   Past Surgical History: She  has a past surgical history that includes Cholecystectomy; Joint replacement; Abdominal hysterectomy; Shoulder arthroscopy with rotator cuff repair (Right, 08/2003); Bladder repair; Laminectomy (02/2011); Incontinence surgery; Total hip arthroplasty; Colonoscopy (05/18/2012); Cataract extraction w/ intraocular lens  implant, bilateral (7 & 8 2014); blephoplasty bilateral (03/10/2014); Lumbar laminectomy/decompression  microdiscectomy (Right, 09/21/2015); Tonsillectomy; herniated disc repair; Cardiac catheterization (N/A, 08/16/2016); and Eye surgery (Right, 05/02/2018).   Medications: She has a current medication list which includes the following prescription(s): calcium-phosphorus-vitamin d, diltiazem, dorzolamide-timolol, fluticasone, furosemide, fluad quadrivalent, ipratropium, irbesartan, lactase, loratadine, lumigan, metoprolol succinate, multivitamin with minerals, netarsudil dimesylate, align, rosuvastatin, synthroid, UNABLE TO FIND, UNABLE TO FIND, and gemtesa.   Allergies: Patient is allergic to codeine, macrobid [nitrofurantoin], miralax [polyethylene glycol], other, pain relief [acetaminophen], paxil [paroxetine hcl], percodan [oxycodone-aspirin], oxycodone, oxycodone-acetaminophen, and penicillins.   Social History: Patient  reports that she has never smoked. She has never used smokeless tobacco. She reports that she does not drink alcohol and does not use drugs.      OBJECTIVE     Physical Exam: Vitals:   09/27/22 1141  BP: (!) 147/92  Pulse: 75     Gen: No apparent distress, A&O x 3.  Detailed Urogynecologic Evaluation:  Deferred.     ASSESSMENT AND PLAN    Destiny [name] is a 89 y.o. with:  1. Overactive bladder     - Continue with Gemtesa '75mg'$  daily, refill provided. We discussed that overall she has had good improvement with the medication (previously leaking 8+ times per day and no longer having leakage).  - For nocturia, avoid drinking at least 2 hours prior to bedtime. Avoid coffee in evenings (even decaf).  - For constipation and bowel leakage, recommended addition psyllium fiber supplement to stool softener. Also discussed option of pelvic PT if she wants.   Return 1 year or sooner if needed.   Destiny Folds, MD  Time spent: I spent 20 minutes dedicated to the care of this patient on the date of this encounter to include pre-visit review of records, face-to-face  time with the patient and post visit documentation and ordering medication/ testing.

## 2022-10-13 ENCOUNTER — Other Ambulatory Visit (HOSPITAL_BASED_OUTPATIENT_CLINIC_OR_DEPARTMENT_OTHER): Payer: Self-pay

## 2022-10-17 ENCOUNTER — Ambulatory Visit (INDEPENDENT_AMBULATORY_CARE_PROVIDER_SITE_OTHER): Payer: Medicare Other

## 2022-10-17 DIAGNOSIS — I441 Atrioventricular block, second degree: Secondary | ICD-10-CM

## 2022-10-17 DIAGNOSIS — H401121 Primary open-angle glaucoma, left eye, mild stage: Secondary | ICD-10-CM | POA: Diagnosis not present

## 2022-10-17 DIAGNOSIS — Z961 Presence of intraocular lens: Secondary | ICD-10-CM | POA: Diagnosis not present

## 2022-10-17 LAB — CUP PACEART REMOTE DEVICE CHECK
Battery Remaining Longevity: 45 mo
Battery Voltage: 2.98 V
Brady Statistic AP VP Percent: 11.88 %
Brady Statistic AP VS Percent: 0.01 %
Brady Statistic AS VP Percent: 88.05 %
Brady Statistic AS VS Percent: 0.06 %
Brady Statistic RA Percent Paced: 11.86 %
Brady Statistic RV Percent Paced: 99.76 %
Date Time Interrogation Session: 20240219111134
Implantable Lead Connection Status: 753985
Implantable Lead Connection Status: 753985
Implantable Lead Implant Date: 20171219
Implantable Lead Implant Date: 20171219
Implantable Lead Location: 753859
Implantable Lead Location: 753860
Implantable Lead Model: 5076
Implantable Lead Model: 5076
Implantable Pulse Generator Implant Date: 20171219
Lead Channel Impedance Value: 1102 Ohm
Lead Channel Impedance Value: 1197 Ohm
Lead Channel Impedance Value: 285 Ohm
Lead Channel Impedance Value: 342 Ohm
Lead Channel Pacing Threshold Amplitude: 0.625 V
Lead Channel Pacing Threshold Amplitude: 1.625 V
Lead Channel Pacing Threshold Pulse Width: 0.4 ms
Lead Channel Pacing Threshold Pulse Width: 0.4 ms
Lead Channel Sensing Intrinsic Amplitude: 3.375 mV
Lead Channel Sensing Intrinsic Amplitude: 3.375 mV
Lead Channel Sensing Intrinsic Amplitude: 8.625 mV
Lead Channel Sensing Intrinsic Amplitude: 8.625 mV
Lead Channel Setting Pacing Amplitude: 2 V
Lead Channel Setting Pacing Amplitude: 3.25 V
Lead Channel Setting Pacing Pulse Width: 0.4 ms
Lead Channel Setting Sensing Sensitivity: 5.6 mV
Zone Setting Status: 755011
Zone Setting Status: 755011

## 2022-10-18 ENCOUNTER — Other Ambulatory Visit: Payer: Self-pay | Admitting: Internal Medicine

## 2022-10-18 DIAGNOSIS — I10 Essential (primary) hypertension: Secondary | ICD-10-CM | POA: Diagnosis not present

## 2022-10-18 DIAGNOSIS — E034 Atrophy of thyroid (acquired): Secondary | ICD-10-CM | POA: Diagnosis not present

## 2022-10-18 DIAGNOSIS — R739 Hyperglycemia, unspecified: Secondary | ICD-10-CM | POA: Diagnosis not present

## 2022-10-18 DIAGNOSIS — I5032 Chronic diastolic (congestive) heart failure: Secondary | ICD-10-CM | POA: Diagnosis not present

## 2022-10-18 LAB — LIPID PANEL
Cholesterol: 158 (ref 0–200)
HDL: 77 — AB (ref 35–70)
LDL Cholesterol: 63
Triglycerides: 86 (ref 40–160)

## 2022-10-18 LAB — HEPATIC FUNCTION PANEL
ALT: 26 U/L (ref 7–35)
AST: 28 (ref 13–35)
Alkaline Phosphatase: 69 (ref 25–125)
Bilirubin, Total: 0.5

## 2022-10-18 LAB — BASIC METABOLIC PANEL
BUN: 18 (ref 4–21)
CO2: 24 — AB (ref 13–22)
Chloride: 106 (ref 99–108)
Creatinine: 0.6 (ref 0.5–1.1)
Glucose: 97
Potassium: 4 mEq/L (ref 3.5–5.1)
Sodium: 145 (ref 137–147)

## 2022-10-18 LAB — COMPREHENSIVE METABOLIC PANEL
Albumin: 4.3 (ref 3.5–5.0)
Calcium: 10 (ref 8.7–10.7)
Globulin: 2

## 2022-10-18 LAB — TSH: TSH: 0.63 (ref 0.41–5.90)

## 2022-10-21 ENCOUNTER — Other Ambulatory Visit: Payer: Self-pay | Admitting: Internal Medicine

## 2022-10-24 ENCOUNTER — Encounter: Payer: Self-pay | Admitting: Adult Health

## 2022-10-24 ENCOUNTER — Non-Acute Institutional Stay: Payer: Medicare Other | Admitting: Adult Health

## 2022-10-24 VITALS — BP 138/64 | HR 85 | Temp 97.6°F | Resp 16 | Ht 63.0 in | Wt 123.2 lb

## 2022-10-24 DIAGNOSIS — I872 Venous insufficiency (chronic) (peripheral): Secondary | ICD-10-CM | POA: Diagnosis not present

## 2022-10-24 DIAGNOSIS — G5701 Lesion of sciatic nerve, right lower limb: Secondary | ICD-10-CM | POA: Diagnosis not present

## 2022-10-24 DIAGNOSIS — I441 Atrioventricular block, second degree: Secondary | ICD-10-CM

## 2022-10-24 DIAGNOSIS — K5901 Slow transit constipation: Secondary | ICD-10-CM | POA: Diagnosis not present

## 2022-10-24 DIAGNOSIS — M542 Cervicalgia: Secondary | ICD-10-CM | POA: Diagnosis not present

## 2022-10-24 MED ORDER — DOCUSATE SODIUM 100 MG PO CAPS: 100.0000 mg | ORAL_CAPSULE | Freq: Every day | ORAL | 1 refills | Status: DC

## 2022-10-24 MED ORDER — PREDNISONE 20 MG PO TABS: 20.0000 mg | ORAL_TABLET | Freq: Two times a day (BID) | ORAL | 0 refills | Status: AC

## 2022-10-24 NOTE — Progress Notes (Signed)
Location:  [facility]  POS: Clinic  Provider: Royal Hawthorn, ANP  Code Status: DNR Goals of Care:     10/24/2022    1:49 PM  Advanced Directives  Does Patient Have a Medical Advance Directive? Yes  Type of Advance Directive Out of facility DNR (pink MOST or yellow form);[place];Living will  Does patient want to make changes to medical advance directive? No - Patient declined  Copy of [place] in Chart? Yes - validated most recent copy scanned in chart (See row information)  Pre-existing out of facility DNR order (yellow form or pink MOST form) Yellow form placed in chart (order not valid for inpatient use)     Chief Complaint  Patient presents with   Medical Management of Chronic Issues    4 month follow up with labs. Patient states she would like to discuss neck problems   Immunizations    Discussed the need for update covid vaccine.    HPI: Patient is a 89 y.o. female seen today for medical management of chronic diseases.    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   Reports some neck pain which has been going on for quite some time but getting worse in the last week. Has been using ice which helps. No falls. Reports pain is 5/10 after aleve.  Not radiating no numbness or tingling.   Left shoulder has bone spurs with some pain used to take injections with Dr Onnie Graham but doesn't go anymore as the treatment was no longer effective.  Right shoulder has been repaired before.     OP: Continues on prolia T score -1.0 in 2019,  Taking gemtesa for OAB followed by urogyn  HLD on Crestor  LDL 63  10/18/22  Hypothyroidism: Lab Results  Component Value Date   TSH 0.63 10/18/2022   Venous insuff: takes lasix qod, sometimes skips due to frequency. Wears compression hose. Has seen vascular surgery,  opted not to tx  Constipation: feels that she has some constipation did have a hard stool with blood afterward which resolved.  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 Cristina Gong   High cholesterol    HTN (hypertension) 07/17/2015   Echo 08/2019: EF 0000000, grade 1 diastolic dysfunction, mild apical HK, normal RV SF, trivial pericardial effusion, moderate MAC, mild MR, mild TR   Hypothyroidism    Insomnia    Lactose intolerance    Lumbar degenerative disc disease    Myoview    Myoview 06/2019: EF 48, normal perfusion; low risk (EF normal by echo 10/2018)   Neuromuscular disorder (HCC)    Pronated ankles /Feet    Onychomycosis    Osteoarthritis    Osteoporosis    Second degree Mobitz II AV block    s/p PPM   Spinal stenosis    Thyroid disease    Varicose veins     Past Surgical History:  Procedure Laterality Date  ABDOMINAL HYSTERECTOMY     BLADDER REPAIR     blephoplasty bilateral  03/10/2014   CATARACT EXTRACTION W/ INTRAOCULAR LENS  IMPLANT, BILATERAL  7 & 8 2014   CHOLECYSTECTOMY     COLONOSCOPY  05/18/2012   EP IMPLANTABLE DEVICE N/A 08/16/2016   MDT [employer] MRI conditional PPM implanted by Dr Rayann Heman for mobitz II second degree AV block   EYE SURGERY Right 05/02/2018   laser eye surgery   herniated disc repair     INCONTINENCE SURGERY     JOINT REPLACEMENT     LAMINECTOMY  02/2011   L3-L5  Dr. Ellene Route   LUMBAR LAMINECTOMY/DECOMPRESSION MICRODISCECTOMY Right 09/21/2015   Procedure: Right Lumbar Three-FourMicrodiskectomy;  Surgeon: Kristeen Miss, MD;  Location: [facility];  Service: Neurosurgery;  Laterality: Right;  Right L3-4 Microdiskectomy   SHOULDER  ARTHROSCOPY WITH ROTATOR CUFF REPAIR Right 08/2003   TONSILLECTOMY     TOTAL HIP ARTHROPLASTY     right 1999    Allergies  Allergen Reactions   Codeine Nausea Only   Macrobid [Nitrofurantoin] Diarrhea   Miralax [Polyethylene Glycol] Other (See Comments)    cramping   Other Nausea And Vomiting and Other (See Comments)   Pain Relief [Acetaminophen] Nausea And Vomiting    Patient states she is not allergic    Paxil [Paroxetine Hcl] Nausea Only   Percodan [Oxycodone-Aspirin] Itching   Oxycodone Nausea Only   Oxycodone-Acetaminophen Nausea Only   Penicillins Rash    Has patient had a PCN reaction causing immediate rash, facial/tongue/throat swelling, SOB or lightheadedness with hypotension: YES Has patient had a PCN reaction causing severe rash involving mucus membranes or skin necrosis: YES Has patient had a PCN reaction that required hospitalization NO Has patient had a PCN reaction occurring within the last 10 years: NO If all of the above answers are "NO", then may proceed with Cephalosporin use.    Outpatient Encounter Medications as of 10/24/2022  Medication Sig   Calcium-Phosphorus-Vitamin D [id number] MG-MG-UNIT CHEW Chew 1 tablet by mouth daily.   diltiazem (CARDIZEM CD) 240 MG 24 hr capsule TAKE ONE CAPSULE BY MOUTH EVERY DAY   dorzolamide-timolol (COSOPT) 22.3-6.8 MG/ML ophthalmic solution Place 1 drop into both eyes 2 (two) times daily.   fluticasone (FLONASE) 50 MCG/ACT nasal spray Place 2 sprays into both nostrils daily.   furosemide (LASIX) 20 MG tablet TAKE ONE TABLET BY MOUTH EVERY OTHER DAY.   influenza vaccine adjuvanted (FLUAD QUADRIVALENT) 0.5 ML injection Inject into the muscle.   ipratropium (ATROVENT) 0.06 % nasal spray Place 1 spray into both nostrils 3 (three) times daily.   irbesartan (AVAPRO) 300 MG tablet TAKE ONE TABLET BY MOUTH DAILY   LACTASE PO Take 1 tablet by mouth as needed (when eating dairy products). Take as directed   Loratadine 10 MG CAPS Take  1 capsule by mouth as needed.   LUMIGAN 0.01 % SOLN Place 1 drop into both eyes at bedtime.   metoprolol succinate (TOPROL-XL) 25 MG 24 hr tablet Take 0.5 tablets (12.5 mg total) by mouth at bedtime.   Multiple Vitamins-Minerals (MULTIVITAMIN WITH MINERALS) tablet Take 1 tablet by mouth daily.   Netarsudil Dimesylate 0.02 % SOLN Apply 1 drop to eye at bedtime. Right eye   predniSONE (DELTASONE) 20 MG tablet Take 1 tablet (20 mg total) by mouth 2 (two) times daily with a meal for 5 days.   Probiotic Product (ALIGN) 4 MG CAPS Take by mouth daily.   rosuvastatin (CRESTOR) 5 MG tablet TAKE ONE  TABLET BY MOUTH ONCE DAILY CHOLESTEROL   SYNTHROID 125 MCG tablet TAKE ONE TABLET BY MOUTH ONCE DAILY   UNABLE TO FIND Take by mouth as needed. Med Name: OTC purified peppermint oil for IBS - 3 caps before lunch and dinner   UNABLE TO FIND Med Name:Hemeopathic ear drops  2 drops as needed   Vibegron (GEMTESA) 75 MG TABS Take 1 tablet (75 mg total) by mouth daily.   [DISCONTINUED] docusate sodium (COLACE) 100 MG capsule Take 100 mg by mouth daily.   docusate sodium (COLACE) 100 MG capsule Take 1 capsule (100 mg total) by mouth daily. Had cramps with miralax, not true allergy.   [DISCONTINUED] diltiazem (CARDIZEM CD) 240 MG 24 hr capsule TAKE ONE CAPSULE BY MOUTH EVERY DAY   No facility-administered encounter medications on file as of 10/24/2022.    Review of Systems:  Review of Systems  Constitutional:  Negative for activity change, appetite change, chills, diaphoresis, fatigue, fever and unexpected weight change.  HENT:  Negative for congestion.   Respiratory:  Negative for cough, shortness of breath and wheezing.   Cardiovascular:  Positive for leg swelling. Negative for chest pain and palpitations.  Gastrointestinal:  Positive for constipation. Negative for abdominal distention, abdominal pain and diarrhea.  Genitourinary:  Negative for difficulty urinating and dysuria.  Musculoskeletal:  Positive for  gait problem and neck pain. Negative for arthralgias, back pain, joint swelling and myalgias.  Neurological:  Negative for dizziness, tremors, seizures, syncope, facial asymmetry, speech difficulty, weakness, light-headedness, numbness and headaches.  Psychiatric/Behavioral:  Negative for agitation, behavioral problems and confusion.     Health Maintenance  Topic Date Due   COVID-19 Vaccine (5 - 2023-24 season) 04/29/2022   Pneumonia Vaccine 64+ Years old (2 of 2 - PCV) 07/27/2023 (Originally 07/15/2015)   Medicare Annual Wellness (AWV)  09/20/2023   DTaP/Tdap/Td (5 - Td or Tdap) 05/10/2031   INFLUENZA VACCINE  Completed   DEXA SCAN  Completed   Zoster Vaccines- Shingrix  Completed   HPV VACCINES  Aged Out    Physical Exam: Vitals:   10/24/22 1349 10/24/22 1353  BP: (!) 144/66 138/64  Pulse: 85   Resp: 16   Temp: 97.6 F (36.4 C)   TempSrc: Temporal   SpO2: 99%   Weight: 123 lb 3.2 oz (55.9 kg)   Height: '5\' 3"'$  (1.6 m)    Body mass index is 21.82 kg/m. Physical Exam Vitals and nursing note reviewed.  Constitutional:      General: She is not in acute distress.    Appearance: She is not diaphoretic.  HENT:     Head: Normocephalic and atraumatic.     Right Ear: Tympanic membrane normal.     Left Ear: Tympanic membrane normal.     Nose: Nose normal.     Mouth/Throat:     Mouth: Mucous membranes are moist.     Pharynx: Oropharynx is clear.  Eyes:     Conjunctiva/sclera: Conjunctivae normal.     Pupils: Pupils are equal, round, and reactive to light.  Neck:     Vascular: No JVD.  Cardiovascular:     Rate and Rhythm: Normal rate and regular rhythm.     Heart sounds: No murmur heard. Pulmonary:     Effort: Pulmonary effort is normal. No respiratory distress.     Breath sounds: Normal breath sounds. No wheezing.  Abdominal:     General: Bowel sounds are normal. There is no distension.     Palpations: Abdomen is soft.  Tenderness: There is no abdominal tenderness.   Musculoskeletal:     Comments: BLE edema +2 on right +1 let Varicose veins R>L  Skin:    General: Skin is warm and dry.  Neurological:     Mental Status: She is alert and oriented to person, place, and time.  Psychiatric:        Mood and Affect: Mood normal.     Labs reviewed: Basic Metabolic Panel: Recent Labs    10/18/22 0000  NA 145  K 4.0  CL 106  CO2 24*  BUN 18  CREATININE 0.6  CALCIUM 10.0  TSH 0.63   Liver Function Tests: Recent Labs    10/18/22 0000  AST 28  ALT 26  ALKPHOS 69  ALBUMIN 4.3   No results for input(s): "LIPASE", "AMYLASE" in the last 8760 hours. No results for input(s): "AMMONIA" in the last 8760 hours. CBC: No results for input(s): "WBC", "NEUTROABS", "HGB", "HCT", "MCV", "PLT" in the last 8760 hours. Lipid Panel: Recent Labs    10/18/22 0000  CHOL 158  HDL 77*  LDLCALC 63  TRIG 86   Lab Results  Component Value Date   HGBA1C 5.8 10/12/2021    Procedures since last visit: [place]  Result Date: 10/17/2022 Scheduled remote reviewed. Normal device function.  RV threshold 1.625V @ 0.66m, stable trend 1 NSVT, 9 beats Next remote 91 days. LA   Assessment/Plan  1. Neck pain Hx of DDD and anterolisthesis per CT scan  Will try prednisone due to significant pain not relieved with aleve or tylenol If no improvement refer to OT - predniSONE (DELTASONE) 20 MG tablet; Take 1 tablet (20 mg total) by mouth 2 (two) times daily with a meal for 5 days.  Dispense: 10 tablet; Refill: 0  2. Slow transit constipation  - docusate sodium (COLACE) 100 MG capsule; Take 1 capsule (100 mg total) by mouth daily. Had cramps with miralax, not true allergy.  Dispense: 30 capsule; Refill: 1  3. Venous insufficiency of both lower extremities Continues compression hose Lasix qod   4. Second degree Mobitz II AV block S/p pacemaker   5. HTN Controlled Continue Avapro, cardizem, lasix  6. Hypothyroidism  Continue  synthroid  Labs/tests ordered:  * No order type specified *BMP prior to apt Next appt:  4 months with Dr GLyndel Safe  Total time 345m:  time greater than 50% of total time spent doing pt counseling and coordination of care

## 2022-10-31 ENCOUNTER — Encounter: Payer: Self-pay | Admitting: Adult Health

## 2022-10-31 ENCOUNTER — Non-Acute Institutional Stay: Payer: Medicare Other | Admitting: Adult Health

## 2022-10-31 VITALS — BP 142/82 | HR 64 | Temp 97.9°F | Resp 17 | Ht 63.0 in | Wt 120.6 lb

## 2022-10-31 DIAGNOSIS — N3000 Acute cystitis without hematuria: Secondary | ICD-10-CM

## 2022-10-31 MED ORDER — CIPROFLOXACIN HCL 250 MG PO TABS: 250.0000 mg | ORAL_TABLET | Freq: Two times a day (BID) | ORAL | 0 refills | Status: DC

## 2022-10-31 NOTE — Progress Notes (Addendum)
Location:  Occupational psychologist of Service:  Clinic (12) Provider:  Royal Hawthorn NP  Virgie Dad, MD  Patient Care Team: Virgie Dad, MD as PCP - General (Internal Medicine) Thompson Grayer, MD (Inactive) as PCP - Electrophysiology (Cardiology) Deland Pretty, MD (Internal Medicine) Ronald Lobo, MD as Consulting Physician (Gastroenterology) Jari Pigg, MD as Consulting Physician (Dermatology) Shon Hough, MD as Consulting Physician (Ophthalmology) Kristeen Miss, MD as Consulting Physician (Neurosurgery) Justice Britain, MD as Consulting Physician (Orthopedic Surgery) Gaynelle Arabian, MD as Consulting Physician (Orthopedic Surgery) Suella Broad, MD as Consulting Physician (Physical Medicine and Rehabilitation)  Extended Emergency Contact Information Primary Emergency Contact: Patricia Nettle, [place] [address] Johnnette Litter of [place] Phone: [phone] Mobile Phone: [phone] Relation: Daughter Secondary Emergency Contact: Marcie Mowers, [place] Phone: [phone] Mobile Phone: [phone] Relation: Daughter  Code Status:  DNR Goals of care: Advanced Directive information    10/31/2022    1:19 PM  Advanced Directives  Does Patient Have a Medical Advance Directive? Yes  Type of Advance Directive Out of facility DNR (pink MOST or yellow form);[place];Living will  Does patient want to make changes to medical advance directive? No - Patient declined  Copy of [place] in Chart? Yes - validated most recent copy scanned in chart (See row information)  Pre-existing out of facility DNR order (yellow form or pink MOST form) Yellow form placed in chart (order not valid for inpatient use)     Chief Complaint  Patient presents with   Acute Visit    Patient states she has been having some burning urination and frequent    HPI:  Pt is a  89 y.o. female seen today for an acute visit for burning on urination and frequency. Got up 3 times last night. Had an incontinent episode this am.  No fever. No chills. No flank pain or bladder pain. Is sure she has a UTI Had a UTI last Nov and went to urgent care. Took azo urine is now orange.  Doesn't drink enough water. She is taking Gemtesa for OAB. Feels she is emptying her bladder.       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 Cristina Gong   High cholesterol    HTN (hypertension) 07/17/2015   Echo 08/2019: EF 0000000, grade 1 diastolic dysfunction, mild apical HK, normal RV SF, trivial pericardial effusion, moderate MAC, mild MR, mild TR   Hypothyroidism    Insomnia    Lactose intolerance    Lumbar degenerative disc disease    Myoview    Myoview 06/2019: EF 48, normal perfusion; low risk (EF normal by echo 10/2018)   Neuromuscular disorder (HCC)    Pronated ankles /Feet    Onychomycosis    Osteoarthritis    Osteoporosis    Second degree Mobitz II AV block    s/p PPM   Spinal stenosis    Thyroid disease  Varicose veins    Past Surgical History:  Procedure Laterality Date   ABDOMINAL HYSTERECTOMY     BLADDER REPAIR     blephoplasty bilateral  03/10/2014   CATARACT EXTRACTION W/ INTRAOCULAR LENS  IMPLANT, BILATERAL  7 & 8 2014   CHOLECYSTECTOMY     COLONOSCOPY  05/18/2012   EP IMPLANTABLE DEVICE N/A 08/16/2016   MDT [employer] MRI conditional PPM implanted by Dr Rayann Heman for mobitz II second degree AV block   EYE SURGERY Right 05/02/2018   laser eye surgery   herniated disc repair     INCONTINENCE SURGERY     JOINT REPLACEMENT     LAMINECTOMY  02/2011   L3-L5   Dr. Ellene Route   LUMBAR LAMINECTOMY/DECOMPRESSION MICRODISCECTOMY Right 09/21/2015   Procedure: Right Lumbar Three-FourMicrodiskectomy;  Surgeon: Kristeen Miss, MD;  Location: [facility];  Service: Neurosurgery;  Laterality: Right;  Right L3-4 Microdiskectomy   SHOULDER ARTHROSCOPY WITH ROTATOR CUFF REPAIR Right 08/2003   TONSILLECTOMY     TOTAL HIP ARTHROPLASTY     right 1999    Allergies  Allergen Reactions   Codeine Nausea Only   Macrobid [Nitrofurantoin] Diarrhea   Miralax [Polyethylene Glycol] Other (See Comments)    cramping   Other Nausea And Vomiting and Other (See Comments)   Pain Relief [Acetaminophen] Nausea And Vomiting    Patient states she is not allergic    Paxil [Paroxetine Hcl] Nausea Only   Percodan [Oxycodone-Aspirin] Itching   Oxycodone Nausea Only   Oxycodone-Acetaminophen Nausea Only   Penicillins Rash    Has patient had a PCN reaction causing immediate rash, facial/tongue/throat swelling, SOB or lightheadedness with hypotension: YES Has patient had a PCN reaction causing severe rash involving mucus membranes or skin necrosis: YES Has patient had a PCN reaction that required hospitalization NO Has patient had a PCN reaction occurring within the last 10 years: NO If all of the above answers are "NO", then may proceed with Cephalosporin use.    Allergies as of 10/31/2022       Reactions   Codeine Nausea Only   Macrobid [nitrofurantoin] Diarrhea   Miralax [polyethylene Glycol] Other (See Comments)   cramping   Other Nausea And Vomiting, Other (See Comments)   Pain Relief [acetaminophen] Nausea And Vomiting   Patient states she is not allergic    Paxil [paroxetine Hcl] Nausea Only   Percodan [oxycodone-aspirin] Itching   Oxycodone Nausea Only   Oxycodone-acetaminophen Nausea Only   Penicillins Rash   Has patient had a PCN reaction causing immediate rash, facial/tongue/throat swelling, SOB or lightheadedness with hypotension: YES Has patient had a PCN reaction  causing severe rash involving mucus membranes or skin necrosis: YES Has patient had a PCN reaction that required hospitalization NO Has patient had a PCN reaction occurring within the last 10 years: NO If all of the above answers are "NO", then may proceed with Cephalosporin use.        Medication List        Accurate as of October 31, 2022  1:38 PM. If you have any questions, ask your nurse or doctor.          Align 4 MG Caps Take by mouth daily.   Calcium-Phosphorus-Vitamin D C400124 MG-MG-UNIT Chew Chew 1 tablet by mouth daily.   diltiazem 240 MG 24 hr capsule Commonly known as: CARDIZEM CD TAKE ONE CAPSULE BY MOUTH EVERY DAY   docusate sodium 100 MG capsule Commonly known as: COLACE Take 1 capsule (100 mg total) by  mouth daily. Had cramps with miralax, not true allergy.   dorzolamide-timolol 2-0.5 % ophthalmic solution Commonly known as: COSOPT Place 1 drop into both eyes 2 (two) times daily.   Fluad Quadrivalent 0.5 ML injection Generic drug: influenza vaccine adjuvanted Inject into the muscle.   fluticasone 50 MCG/ACT nasal spray Commonly known as: FLONASE Place 2 sprays into both nostrils daily.   furosemide 20 MG tablet Commonly known as: LASIX TAKE ONE TABLET BY MOUTH EVERY OTHER DAY.   Gemtesa 75 MG Tabs Generic drug: Vibegron Take 1 tablet (75 mg total) by mouth daily.   ipratropium 0.06 % nasal spray Commonly known as: ATROVENT Place 1 spray into both nostrils 3 (three) times daily.   irbesartan 300 MG tablet Commonly known as: AVAPRO TAKE ONE TABLET BY MOUTH DAILY   LACTASE PO Take 1 tablet by mouth as needed (when eating dairy products). Take as directed   Loratadine 10 MG Caps Take 1 capsule by mouth as needed.   Lumigan 0.01 % Soln Generic drug: bimatoprost Place 1 drop into both eyes at bedtime.   metoprolol succinate 25 MG 24 hr tablet Commonly known as: TOPROL-XL Take 0.5 tablets (12.5 mg total) by mouth at bedtime.    multivitamin with minerals tablet Take 1 tablet by mouth daily.   Netarsudil Dimesylate 0.02 % Soln Apply 1 drop to eye at bedtime. Right eye   rosuvastatin 5 MG tablet Commonly known as: CRESTOR TAKE ONE TABLET BY MOUTH ONCE DAILY CHOLESTEROL   Synthroid 125 MCG tablet Generic drug: levothyroxine TAKE ONE TABLET BY MOUTH ONCE DAILY   UNABLE TO FIND Take by mouth as needed. Med Name: OTC purified peppermint oil for IBS - 3 caps before lunch and dinner   UNABLE TO FIND Med Name:Hemeopathic ear drops  2 drops as needed        Review of Systems  Constitutional:  Negative for activity change, appetite change, chills, diaphoresis, fatigue, fever and unexpected weight change.  HENT:  Negative for congestion.   Respiratory:  Negative for cough, shortness of breath and wheezing.   Cardiovascular:  Negative for chest pain, palpitations and leg swelling.  Gastrointestinal:  Negative for abdominal distention, abdominal pain, constipation and diarrhea.  Genitourinary:  Positive for dysuria, frequency and urgency. Negative for decreased urine volume, difficulty urinating, flank pain, hematuria and pelvic pain.       New incontinence  Musculoskeletal:  Positive for back pain (chronic), gait problem and neck pain (improved but still bothering her). Negative for arthralgias, joint swelling and myalgias.  Neurological:  Negative for dizziness, tremors, seizures, syncope, facial asymmetry, speech difficulty, weakness, light-headedness, numbness and headaches.  Psychiatric/Behavioral:  Negative for agitation, behavioral problems and confusion.     Immunization History  Administered Date(s) Administered   DTaP 06/19/2012   Fluad Quad(high Dose 65+) 05/30/2022   Influenza, High Dose Seasonal PF 06/26/2020   Influenza,inj,Quad PF,6+ Mos 05/12/2016, 06/22/2018, 05/07/2019   Influenza-Unspecified 08/29/2014, 06/19/2017, 06/26/2020   Moderna Covid-19 Vaccine Bivalent Booster 48yr & up  10/08/2021   Moderna Sars-Covid-2 Vaccination 09/07/2019, 10/10/2019, 07/11/2020   Pneumococcal Polysaccharide-23 12/11/1999   Pneumococcal-Unspecified 08/29/2013, 07/14/2014   Td 04/07/2010, 06/19/2012   Tdap 05/09/2021   Zoster Recombinat (Shingrix) 03/30/2017, 06/16/2017   Zoster, Live 06/24/2006   Pertinent  Health Maintenance Due  Topic Date Due   INFLUENZA VACCINE  Completed   DEXA SCAN  Completed      06/21/2022    1:51 PM 09/19/2022    3:39 PM 09/19/2022  4:05 PM 10/24/2022    1:49 PM 10/31/2022    1:18 PM  [place] in the past year? 0 0 0 0 0  Was there an injury with Fall? 0 0 0 0 0  Fall Risk Category Calculator 0 0 0 0 0  Fall Risk Category (Retired) Low      (RETIRED) Patient Fall Risk Level Low fall risk      Patient at Risk for Falls Due to History of fall(s);Impaired balance/gait;Impaired mobility Impaired mobility No Fall Risks Impaired mobility Impaired mobility  Fall risk Follow up Falls evaluation completed Falls evaluation completed Falls evaluation completed Falls evaluation completed Falls evaluation completed   Functional Status Survey:    Vitals:   10/31/22 1321 10/31/22 1322  BP: (!) 144/80 (!) 142/82  Pulse: 64   Resp: 17   Temp: 97.9 F (36.6 C)   TempSrc: Temporal   SpO2: 96%   Weight: 120 lb 9.6 oz (54.7 kg)   Height: '5\' 3"'$  (1.6 m)    Body mass index is 21.36 kg/m. Physical Exam Constitutional:      Appearance: Normal appearance.  Cardiovascular:     Rate and Rhythm: Normal rate and regular rhythm.  Pulmonary:     Effort: Pulmonary effort is normal.     Breath sounds: Normal breath sounds.  Abdominal:     General: There is no distension.     Tenderness: There is no abdominal tenderness. There is no right CVA tenderness or left CVA tenderness.  Neurological:     Mental Status: She is alert and oriented to person, place, and time. Mental status is at baseline.  Psychiatric:        Mood and Affect: Mood normal.     Labs  reviewed: Recent Labs    10/18/22 0000  NA 145  K 4.0  CL 106  CO2 24*  BUN 18  CREATININE 0.6  CALCIUM 10.0   Recent Labs    10/18/22 0000  AST 28  ALT 26  ALKPHOS 69  ALBUMIN 4.3   No results for input(s): "WBC", "NEUTROABS", "HGB", "HCT", "MCV", "PLT" in the last 8760 hours. Lab Results  Component Value Date   TSH 0.63 10/18/2022   Lab Results  Component Value Date   HGBA1C 5.8 10/12/2021   Lab Results  Component Value Date   CHOL 158 10/18/2022   HDL 77 (A) 10/18/2022   LDLCALC 63 10/18/2022   TRIG 86 10/18/2022   CHOLHDL 2.3 01/19/2017    Significant Diagnostic Results in last 30 days:  CUP PACEART REMOTE DEVICE CHECK  Result Date: 10/17/2022 Scheduled remote reviewed. Normal device function.  RV threshold 1.625V @ 0.65m, stable trend 1 NSVT, 9 beats Next remote 91 days. LA   Assessment/Plan  1. Acute cystitis without hematuria Needs UA C and S but we were not able to obtain today. She is certain she has a UTI with three symptoms. I instructed to provide a clean catch sample and call the clinic nurse to send to the lab prior to starting meds.  She has a hx of severe allergy to pcn and intolerance to macrobid.   - ciprofloxacin (CIPRO) 250 MG tablet; Take 1 tablet (250 mg total) by mouth 2 (two) times daily for 3 days.  Dispense: 6 tablet; Refill: 0  Labs/tests ordered:  UA C and S (did take azo which may affect the clarity)  Total time 239m:  time greater than 50% of total time spent doing pt counseling and  coordination of care

## 2022-10-31 NOTE — Patient Instructions (Signed)
Please wipe and provide urine specimen. Call Heather when complete. Then start antibiotic.

## 2022-11-01 DIAGNOSIS — R300 Dysuria: Secondary | ICD-10-CM | POA: Diagnosis not present

## 2022-11-03 ENCOUNTER — Telehealth: Payer: Self-pay | Admitting: Internal Medicine

## 2022-11-03 DIAGNOSIS — N3000 Acute cystitis without hematuria: Secondary | ICD-10-CM

## 2022-11-03 MED ORDER — CIPROFLOXACIN HCL 250 MG PO TABS: 250.0000 mg | ORAL_TABLET | Freq: Two times a day (BID) | ORAL | 0 refills | Status: AC

## 2022-11-03 NOTE — Telephone Encounter (Signed)
Her Urine Culture came positive for Proteus More then 100K I have extended her Cipro for 4 more days

## 2022-11-30 NOTE — Progress Notes (Signed)
[facility] Summary Report / Loop Recorder 

## 2022-12-16 ENCOUNTER — Ambulatory Visit (INDEPENDENT_AMBULATORY_CARE_PROVIDER_SITE_OTHER): Payer: Medicare Other

## 2022-12-16 DIAGNOSIS — M810 Age-related osteoporosis without current pathological fracture: Secondary | ICD-10-CM | POA: Diagnosis not present

## 2022-12-16 MED ORDER — DENOSUMAB 60 MG/ML ~~LOC~~ SOSY
60.0000 mg | PREFILLED_SYRINGE | Freq: Once | SUBCUTANEOUS | Status: AC
Start: 2022-12-16 — End: 2022-12-16
  Administered 2022-12-16: 60 mg via SUBCUTANEOUS

## 2022-12-16 NOTE — Progress Notes (Signed)
Patient was in office for her Prolia injection and tolerated well.

## 2022-12-22 DIAGNOSIS — Z23 Encounter for immunization: Secondary | ICD-10-CM | POA: Diagnosis not present

## 2023-01-12 ENCOUNTER — Other Ambulatory Visit: Payer: Self-pay

## 2023-01-12 ENCOUNTER — Other Ambulatory Visit (HOSPITAL_BASED_OUTPATIENT_CLINIC_OR_DEPARTMENT_OTHER): Payer: Self-pay

## 2023-01-12 ENCOUNTER — Encounter (HOSPITAL_BASED_OUTPATIENT_CLINIC_OR_DEPARTMENT_OTHER): Payer: Self-pay | Admitting: Emergency Medicine

## 2023-01-12 ENCOUNTER — Emergency Department (HOSPITAL_BASED_OUTPATIENT_CLINIC_OR_DEPARTMENT_OTHER)
Admission: EM | Admit: 2023-01-12 | Discharge: 2023-01-12 | Disposition: A | Discharge status: Home / Self Care | Payer: Medicare Other | Attending: Emergency Medicine | Admitting: Emergency Medicine

## 2023-01-12 DIAGNOSIS — I10 Essential (primary) hypertension: Secondary | ICD-10-CM | POA: Insufficient documentation

## 2023-01-12 DIAGNOSIS — K649 Unspecified hemorrhoids: Secondary | ICD-10-CM

## 2023-01-12 DIAGNOSIS — K644 Residual hemorrhoidal skin tags: Secondary | ICD-10-CM | POA: Diagnosis not present

## 2023-01-12 DIAGNOSIS — Z79899 Other long term (current) drug therapy: Secondary | ICD-10-CM | POA: Insufficient documentation

## 2023-01-12 DIAGNOSIS — K625 Hemorrhage of anus and rectum: Secondary | ICD-10-CM | POA: Diagnosis present

## 2023-01-12 HISTORY — DX: Unspecified hemorrhoids: K64.9

## 2023-01-12 LAB — CBC
HCT: 40.5 % (ref 36.0–46.0)
Hemoglobin: 13 g/dL (ref 12.0–15.0)
MCH: 30.2 pg (ref 26.0–34.0)
MCHC: 32.1 g/dL (ref 30.0–36.0)
MCV: 94 fL (ref 80.0–100.0)
Platelets: 215 10*3/uL (ref 150–400)
RBC: 4.31 MIL/uL (ref 3.87–5.11)
RDW: 14 % (ref 11.5–15.5)
WBC: 6.7 10*3/uL (ref 4.0–10.5)
nRBC: 0 % (ref 0.0–0.2)

## 2023-01-12 MED ORDER — HYDROCORTISONE ACETATE 25 MG RE SUPP
25.0000 mg | Freq: Two times a day (BID) | RECTAL | 0 refills | Status: DC
  Filled 2023-01-12: qty 12, 6d supply, fill #0

## 2023-01-12 NOTE — Discharge Instructions (Signed)
You were evaluated today due to hemorrhoids. I have prescribed suppositories to be used twice daily. Please follow up as needed with general surgery for further evaluation and surgical management as needed.

## 2023-01-12 NOTE — ED Triage Notes (Signed)
Reports bleeding hemorrhoids x about a week.  From [facility]

## 2023-01-12 NOTE — ED Provider Notes (Signed)
[place] EMERGENCY DEPARTMENT AT [facility] Provider Note   CSN: [id number] Arrival date & time: 01/12/23  1551     History  Chief Complaint  Patient presents with   Rectal Bleeding    Destiny [name] is a 89 y.o. female.  Patient presents to the emergency department complaining of bleeding hemorrhoids which have been ongoing for the past week.  Patient states that she has a history of hemorrhoids and had similar issues approximate 1 year ago.  She states that she has mild pain with defecation.  She noticed some bright red blood on toilet and bright red blood on her toilet paper.  This began approximately-1 week ago.  Past medical history significant for diverticulosis, GERD, emphysema, constipation, hypertension, secondary type II AV block  HPI     Home Medications Prior to Admission medications   Medication Sig Start Date End Date Taking? Authorizing Provider  hydrocortisone (ANUSOL-HC) 25 MG suppository Place 1 suppository (25 mg total) rectally 2 (two) times daily. 01/12/23  Yes Darrick Grinder, PA-C  Calcium-Phosphorus-Vitamin D 250-107-500 MG-MG-UNIT CHEW Chew 1 tablet by mouth daily.    [provider]  diltiazem (CARDIZEM CD) 240 MG 24 hr capsule TAKE ONE CAPSULE BY MOUTH EVERY DAY 10/21/22   Mahlon Gammon, MD  docusate sodium (COLACE) 100 MG capsule Take 1 capsule (100 mg total) by mouth daily. Had cramps with miralax, not true allergy. 10/24/22   Fletcher Anon, NP  dorzolamide-timolol (COSOPT) 22.3-6.8 MG/ML ophthalmic solution Place 1 drop into both eyes 2 (two) times daily. 07/07/15   [provider]  fluticasone (FLONASE) 50 MCG/ACT nasal spray Place 2 sprays into both nostrils daily. 06/21/22   Mahlon Gammon, MD  furosemide (LASIX) 20 MG tablet TAKE ONE TABLET BY MOUTH EVERY OTHER DAY. 02/17/22   Mahlon Gammon, MD  influenza vaccine adjuvanted (FLUAD QUADRIVALENT) 0.5 ML injection Inject into the muscle. 05/30/22   Judyann Munson, MD   ipratropium (ATROVENT) 0.06 % nasal spray Place 1 spray into both nostrils 3 (three) times daily. 06/21/22   Mahlon Gammon, MD  irbesartan (AVAPRO) 300 MG tablet TAKE ONE TABLET BY MOUTH DAILY 04/18/22   Ngetich, Dinah C, NP  LACTASE PO Take 1 tablet by mouth as needed (when eating dairy products). Take as directed    [provider]  Loratadine 10 MG CAPS Take 1 capsule by mouth as needed.    [provider]  LUMIGAN 0.01 % SOLN Place 1 drop into both eyes at bedtime. 07/16/17   [provider]  metoprolol succinate (TOPROL-XL) 25 MG 24 hr tablet Take 0.5 tablets (12.5 mg total) by mouth at bedtime. 08/02/22   Mahlon Gammon, MD  Multiple Vitamins-Minerals (MULTIVITAMIN WITH MINERALS) tablet Take 1 tablet by mouth daily.    [provider]  Netarsudil Dimesylate 0.02 % SOLN Apply 1 drop to eye at bedtime. Right eye    [provider]  Probiotic Product (ALIGN) 4 MG CAPS Take by mouth daily.    [provider]  rosuvastatin (CRESTOR) 5 MG tablet TAKE ONE TABLET BY MOUTH ONCE DAILY CHOLESTEROL 10/18/22   Mahlon Gammon, MD  SYNTHROID 125 MCG tablet TAKE ONE TABLET BY MOUTH ONCE DAILY 08/24/22   Mahlon Gammon, MD  UNABLE TO FIND Take by mouth as needed. Med Name: OTC purified peppermint oil for IBS - 3 caps before lunch and dinner    [provider]  UNABLE TO FIND Med Name:Hemeopathic ear drops  2 drops as needed    [provider]  Vibegron (GEMTESA) 75 MG TABS Take 1 tablet (75 mg total) by mouth daily. 09/27/22   Marguerita Beards, MD      Allergies    Codeine, Macrobid [nitrofurantoin], Miralax [polyethylene glycol], Other, Pain relief [acetaminophen], Paxil [paroxetine hcl], Percodan [oxycodone-aspirin], Oxycodone, Oxycodone-acetaminophen, and Penicillins    Review of Systems   Review of Systems  Physical Exam Updated Vital Signs BP (!) 150/77   Pulse 99   Temp 98.3 F (36.8 C) (Oral)   Resp 16   SpO2 93%   Physical Exam Vitals and nursing note reviewed. Exam conducted with a chaperone present.  Constitutional:      General: She is not in acute distress.    Appearance: She is well-developed.  HENT:     Head: Normocephalic and atraumatic.  Eyes:     Conjunctiva/sclera: Conjunctivae normal.  Cardiovascular:     Rate and Rhythm: Normal rate and regular rhythm.     Heart sounds: No murmur heard. Pulmonary:     Effort: Pulmonary effort is normal. No respiratory distress.     Breath sounds: Normal breath sounds.  Abdominal:     Palpations: Abdomen is soft.     Tenderness: There is no abdominal tenderness.  Genitourinary:   Musculoskeletal:        General: No swelling.     Cervical back: Neck supple.  Skin:    General: Skin is warm and dry.     Capillary Refill: Capillary refill takes less than 2 seconds.  Neurological:     Mental Status: She is alert.  Psychiatric:        Mood and Affect: Mood normal.     ED Results / Procedures / Treatments   Labs (all labs ordered are listed, but only abnormal results are displayed) Labs Reviewed  CBC    EKG None  Radiology No results found.  Procedures Procedures    Medications Ordered in ED Medications - No data to display  ED Course/ Medical Decision Making/ A&P                             Medical Decision Making Amount and/or Complexity of Data Reviewed Labs: ordered.   Patient presents with a chief complaint of rectal bleeding.  Differential diagnosis includes external hemorrhoids, internal hemorrhoids, GI bleed, others  I ordered and reviewed labs including a CBC.  CBC was unremarkable with no anemia noted  Rectal exam revealed external hemorrhoids but no internal hemorrhoids were appreciated. No thrombosed hemorrhoids. There was no melena.  No obvious bleeding upon examination.  Patient's presentation and history are consistent with hemorrhoids.  Plan to discharge with prescription for Anusol and follow-up  information for surgery for definitive management as needed.  Patient voices understanding with plan.  Discharge home.        Final Clinical Impression(s) / ED Diagnoses Final diagnoses:  External hemorrhoids    Rx / DC Orders ED Discharge Orders          Ordered    hydrocortisone (ANUSOL-HC) 25 MG suppository  2 times daily        01/12/23 1636              Pamala Duffel 01/12/23 1638    Cathren Laine, MD 01/12/23 1740

## 2023-01-12 NOTE — ED Notes (Signed)
Discharge teaching to patient. Patient understandings discharge teaching. Called [facility] for pickup.

## 2023-01-16 ENCOUNTER — Ambulatory Visit (INDEPENDENT_AMBULATORY_CARE_PROVIDER_SITE_OTHER): Payer: Medicare Other

## 2023-01-16 DIAGNOSIS — I441 Atrioventricular block, second degree: Secondary | ICD-10-CM

## 2023-01-16 LAB — CUP PACEART REMOTE DEVICE CHECK
Battery Remaining Longevity: 41 mo
Battery Voltage: 2.97 V
Brady Statistic AP VP Percent: 21.07 %
Brady Statistic AP VS Percent: 0.06 %
Brady Statistic AS VP Percent: 78.69 %
Brady Statistic AS VS Percent: 0.18 %
Brady Statistic RA Percent Paced: 20.59 %
Brady Statistic RV Percent Paced: 98.08 %
Date Time Interrogation Session: 20240520083504
Implantable Lead Connection Status: 753985
Implantable Lead Connection Status: 753985
Implantable Lead Implant Date: 20171219
Implantable Lead Implant Date: 20171219
Implantable Lead Location: 753859
Implantable Lead Location: 753860
Implantable Lead Model: 5076
Implantable Lead Model: 5076
Implantable Pulse Generator Implant Date: 20171219
Lead Channel Impedance Value: 1102 Ohm
Lead Channel Impedance Value: 1159 Ohm
Lead Channel Impedance Value: 285 Ohm
Lead Channel Impedance Value: 323 Ohm
Lead Channel Pacing Threshold Amplitude: 0.625 V
Lead Channel Pacing Threshold Amplitude: 1.5 V
Lead Channel Pacing Threshold Pulse Width: 0.4 ms
Lead Channel Pacing Threshold Pulse Width: 0.4 ms
Lead Channel Sensing Intrinsic Amplitude: 10.875 mV
Lead Channel Sensing Intrinsic Amplitude: 10.875 mV
Lead Channel Sensing Intrinsic Amplitude: 3.125 mV
Lead Channel Sensing Intrinsic Amplitude: 3.125 mV
Lead Channel Setting Pacing Amplitude: 2 V
Lead Channel Setting Pacing Amplitude: 3 V
Lead Channel Setting Pacing Pulse Width: 0.4 ms
Lead Channel Setting Sensing Sensitivity: 5.6 mV
Zone Setting Status: 755011
Zone Setting Status: 755011

## 2023-01-19 ENCOUNTER — Other Ambulatory Visit: Payer: Self-pay | Admitting: Internal Medicine

## 2023-01-31 ENCOUNTER — Other Ambulatory Visit: Payer: Self-pay | Admitting: Internal Medicine

## 2023-02-14 NOTE — Progress Notes (Signed)
Remote pacemaker transmission.   

## 2023-02-21 DIAGNOSIS — I10 Essential (primary) hypertension: Secondary | ICD-10-CM | POA: Diagnosis not present

## 2023-02-21 LAB — BASIC METABOLIC PANEL
BUN: 15 (ref 4–21)
CO2: 23 — AB (ref 13–22)
Chloride: 106 (ref 99–108)
Creatinine: 0.5 (ref 0.5–1.1)
Glucose: 103
Potassium: 4.2 mEq/L (ref 3.5–5.1)
Sodium: 141 (ref 137–147)

## 2023-02-21 LAB — COMPREHENSIVE METABOLIC PANEL
Calcium: 9.7 (ref 8.7–10.7)
eGFR: 87

## 2023-02-28 ENCOUNTER — Encounter: Payer: Self-pay | Admitting: Internal Medicine

## 2023-02-28 ENCOUNTER — Non-Acute Institutional Stay: Payer: Medicare Other | Admitting: Internal Medicine

## 2023-02-28 VITALS — BP 148/70 | HR 88 | Temp 97.7°F | Resp 16 | Ht 63.0 in | Wt 124.6 lb

## 2023-02-28 DIAGNOSIS — I10 Essential (primary) hypertension: Secondary | ICD-10-CM

## 2023-02-28 DIAGNOSIS — K644 Residual hemorrhoidal skin tags: Secondary | ICD-10-CM

## 2023-02-28 DIAGNOSIS — I5032 Chronic diastolic (congestive) heart failure: Secondary | ICD-10-CM

## 2023-02-28 DIAGNOSIS — R600 Localized edema: Secondary | ICD-10-CM | POA: Diagnosis not present

## 2023-02-28 DIAGNOSIS — Z950 Presence of cardiac pacemaker: Secondary | ICD-10-CM

## 2023-02-28 DIAGNOSIS — M810 Age-related osteoporosis without current pathological fracture: Secondary | ICD-10-CM | POA: Diagnosis not present

## 2023-02-28 MED ORDER — HYDROCORTISONE ACETATE 25 MG RE SUPP: 25.0000 mg | Freq: Two times a day (BID) | RECTAL | 3 refills | Status: DC

## 2023-02-28 NOTE — Progress Notes (Signed)
Location:  [facility] features editor of Service:  Clinic (12)  Provider:   Code Status:  Goals of Care:     02/28/2023    1:23 PM  Advanced Directives  Does Patient Have a Medical Advance Directive? Yes  Type of Estate agent of [place];Out of facility DNR (pink MOST or yellow form)  Does patient want to make changes to medical advance directive? No - Patient declined  Copy of Healthcare Power of Attorney in Chart? No - copy requested     Chief Complaint  Patient presents with   Medical Management of Chronic Issues    Patient is being seen for a 4 month follow up. Patient wants to discuss gemtesa   Immunizations    Patient is due for the updated covid vaccine     HPI: Patient is a 89 y.o. female seen today for medical management of chronic diseases.   [place] in [place] Has daughter in [place] who helps  Patient has a history of Hypertension Diarrhea and abdominal cramps Patient said that she has been diagnosed with IBS by Dr. Matthias Hughs Urinary incontinence Osteoporosis on Prolia S/p PPM    Acute issue Patient went to ER 2 months ago with bleeding hemorrhoids. She had external hemorrhoid She was prescribed Anusol HC and was given contact for surgery.  She wanted to know if she needs to go and follow-up with them.  She is not having any issues anymore no bleeding no pain Lower extremity edema Her right leg is significantly more bigger than the left forearm .  She is not taking her Lasix due to urinary incontinence  urinary frequency Is taking Gemtesa which helps Walks with her walker no falls.  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 Matthias Hughs   Hemorrhoids 01/12/2023   High cholesterol    HTN (hypertension) 07/17/2015   Echo 08/2019: EF 50-55, grade 1 diastolic dysfunction, mild apical HK, normal RV SF, trivial pericardial effusion, moderate MAC, mild MR, mild TR   Hypothyroidism    Insomnia    Lactose intolerance    Lumbar degenerative disc disease    Myoview    Myoview 06/2019: EF 48, normal perfusion; low risk (EF normal by echo 10/2018)   Neuromuscular disorder (HCC)    Pronated ankles /Feet    Onychomycosis    Osteoarthritis    Osteoporosis    Second degree Mobitz II AV block    s/p PPM   Spinal stenosis    Thyroid disease    Varicose veins     Past Surgical History:  Procedure Laterality Date   ABDOMINAL HYSTERECTOMY     BLADDER REPAIR     blephoplasty bilateral  03/10/2014   CATARACT EXTRACTION W/ INTRAOCULAR LENS  IMPLANT, BILATERAL  7 & 8 2014   CHOLECYSTECTOMY     COLONOSCOPY  05/18/2012   EP IMPLANTABLE DEVICE N/A 08/16/2016   MDT Advisa MRI conditional PPM implanted by Dr Johney Frame for [employer]  II second degree AV block   EYE SURGERY Right 05/02/2018   laser eye surgery   herniated disc repair     INCONTINENCE SURGERY     JOINT REPLACEMENT     LAMINECTOMY  02/2011   L3-L5  Dr. Danielle Dess   LUMBAR LAMINECTOMY/DECOMPRESSION MICRODISCECTOMY Right 09/21/2015   Procedure: Right Lumbar Three-FourMicrodiskectomy;  Surgeon: Barnett Abu, MD;  Location: [facility];  Service: Neurosurgery;  Laterality: Right;  Right L3-4 Microdiskectomy   SHOULDER ARTHROSCOPY WITH ROTATOR CUFF REPAIR Right 08/2003   TONSILLECTOMY     TOTAL HIP ARTHROPLASTY     right 1999    Allergies  Allergen Reactions   Codeine Nausea Only   Macrobid [Nitrofurantoin] Diarrhea   Miralax [Polyethylene Glycol] Other (See Comments)    cramping   Other Nausea And Vomiting and Other (See Comments)   Pain Relief [Acetaminophen] Nausea And Vomiting     Patient states she is not allergic    Paxil [Paroxetine Hcl] Nausea Only   Percodan [Oxycodone-Aspirin] Itching   Oxycodone Nausea Only   Oxycodone-Acetaminophen Nausea Only   Penicillins Rash    Has patient had a PCN reaction causing immediate rash, facial/tongue/throat swelling, SOB or lightheadedness with hypotension: YES Has patient had a PCN reaction causing severe rash involving mucus membranes or skin necrosis: YES Has patient had a PCN reaction that required hospitalization NO Has patient had a PCN reaction occurring within the last 10 years: NO If all of the above answers are "NO", then may proceed with Cephalosporin use.    Outpatient Encounter Medications as of 02/28/2023  Medication Sig   Calcium-Phosphorus-Vitamin D 250-107-500 MG-MG-UNIT CHEW Chew 1 tablet by mouth daily.   diltiazem (CARDIZEM CD) 240 MG 24 hr capsule TAKE ONE CAPSULE BY MOUTH EVERY DAY   docusate sodium (COLACE) 100 MG capsule Take 1 capsule (100 mg total) by mouth daily. Had cramps with miralax, not true allergy.   dorzolamide-timolol (COSOPT) 22.3-6.8 MG/ML ophthalmic solution Place 1 drop into both eyes 2 (two) times daily.   fluticasone (FLONASE) 50 MCG/ACT nasal spray Place 2 sprays into both nostrils daily.   furosemide (LASIX) 20 MG tablet TAKE ONE TABLET BY MOUTH EVERY OTHER DAY.   hydrocortisone (ANUSOL-HC) 25 MG suppository Place 1 suppository (25 mg total) rectally 2 (two) times daily.   influenza vaccine adjuvanted (FLUAD QUADRIVALENT) 0.5 ML injection Inject into the muscle.   ipratropium (ATROVENT) 0.06 % nasal spray Place 1 spray into both nostrils 3 (three) times daily.   irbesartan (AVAPRO) 300 MG tablet TAKE ONE TABLET BY MOUTH DAILY   LACTASE PO Take 1 tablet by mouth as needed (when eating dairy products). Take as directed   Loratadine 10 MG CAPS Take 1 capsule by mouth as needed.   LUMIGAN 0.01 % SOLN Place 1 drop into both eyes at bedtime.   metoprolol succinate (TOPROL-XL) 25 MG 24  hr tablet TAKE 1/2 TABLET BY MOUTH AT BEDTIME   Multiple Vitamins-Minerals (MULTIVITAMIN WITH MINERALS) tablet Take 1 tablet by mouth daily.   Netarsudil Dimesylate 0.02 % SOLN Apply 1 drop to eye at bedtime. Right eye   Probiotic Product (ALIGN) 4 MG CAPS Take by mouth daily.   rosuvastatin (CRESTOR) 5 MG tablet TAKE ONE TABLET BY MOUTH ONCE DAILY CHOLESTEROL   SYNTHROID 125 MCG tablet TAKE ONE TABLET BY MOUTH ONCE DAILY   UNABLE TO FIND Take by mouth as needed. Med Name: OTC purified peppermint oil for IBS - 3 caps before lunch and dinner   UNABLE  TO FIND Med Name:Hemeopathic ear drops  2 drops as needed   Vibegron (GEMTESA) 75 MG TABS Take 1 tablet (75 mg total) by mouth daily.   No facility-administered encounter medications on file as of 02/28/2023.    Review of Systems:  Review of Systems  Constitutional:  Negative for activity change and appetite change.  HENT: Negative.    Respiratory:  Negative for cough and shortness of breath.   Cardiovascular:  Positive for leg swelling.  Gastrointestinal:  Negative for constipation.  Genitourinary:  Positive for frequency.  Musculoskeletal:  Positive for gait problem. Negative for arthralgias and myalgias.  Skin: Negative.   Neurological:  Negative for dizziness and weakness.  Psychiatric/Behavioral:  Negative for confusion, dysphoric mood and sleep disturbance.     Health Maintenance  Topic Date Due   COVID-19 Vaccine (6 - 2023-24 season) 03/16/2023 (Originally 02/16/2023)   Pneumonia Vaccine 26+ Years old (2 of 2 - PCV) 07/27/2023 (Originally 07/15/2015)   INFLUENZA VACCINE  03/30/2023   Medicare Annual Wellness (AWV)  09/20/2023   DTaP/Tdap/Td (5 - Td or Tdap) 05/10/2031   DEXA SCAN  Completed   Zoster Vaccines- Shingrix  Completed   HPV VACCINES  Aged Out    Physical Exam: Vitals:   02/28/23 1324 02/28/23 1328  BP: (!) 152/68 (!) 148/70  Pulse: 88   Resp: 16   Temp: 97.7 F (36.5 C)   TempSrc: Temporal   SpO2: 96%    Weight: 124 lb 9.6 oz (56.5 kg)   Height: 5\' 3"  (1.6 m)    Body mass index is 22.07 kg/m. Physical Exam Vitals reviewed.  Constitutional:      Appearance: Normal appearance.  HENT:     Head: Normocephalic.     Right Ear: Tympanic membrane normal.     Left Ear: Tympanic membrane normal.     Nose: Nose normal.     Mouth/Throat:     Mouth: Mucous membranes are moist.     Pharynx: Oropharynx is clear.  Eyes:     Pupils: Pupils are equal, round, and reactive to light.  Cardiovascular:     Rate and Rhythm: Normal rate and regular rhythm.     Pulses: Normal pulses.     Heart sounds: Normal heart sounds. No murmur heard. Pulmonary:     Effort: Pulmonary effort is normal.     Breath sounds: Normal breath sounds.  Abdominal:     General: Abdomen is flat. Bowel sounds are normal.     Palpations: Abdomen is soft.  Genitourinary:    Comments: Patient has one external Hemorrhoid with no bleeding or redness Musculoskeletal:        General: Swelling present.     Cervical back: Neck supple.     Comments: Right leg Swollen much more then Left Also has varicose veins No Pain or warmth  Skin:    General: Skin is warm.  Neurological:     General: No focal deficit present.     Mental Status: She is alert and oriented to person, place, and time.  Psychiatric:        Mood and Affect: Mood normal.        Thought Content: Thought content normal.     Labs reviewed: Basic Metabolic Panel: Recent Labs    10/18/22 0000 02/21/23 0000  NA 145 141  K 4.0 4.2  CL 106 106  CO2 24* 23*  BUN 18 15  CREATININE 0.6 0.5  CALCIUM 10.0 9.7  TSH 0.63  --  Liver Function Tests: Recent Labs    10/18/22 0000  AST 28  ALT 26  ALKPHOS 69  ALBUMIN 4.3   No results for input(s): "LIPASE", "AMYLASE" in the last 8760 hours. No results for input(s): "AMMONIA" in the last 8760 hours. CBC: Recent Labs    01/12/23 1609  WBC 6.7  HGB 13.0  HCT 40.5  MCV 94.0  PLT 215   Lipid  Panel: Recent Labs    10/18/22 0000  CHOL 158  HDL 77*  LDLCALC 63  TRIG 86   Lab Results  Component Value Date   HGBA1C 5.8 10/12/2021    Procedures since last visit: No results found.  Assessment/Plan 1. Leg edema, right  - VAS [place] LOWER EXTREMITY VENOUS (DVT)  2. External hemorrhoids It is not bothering her right now She wants to hold off seeing surgery I will call in Anusol as needed which she can use. Also she needs to work on her bowels and avoid constipation  3. Senile osteoporosis On Prolia PSC  4. Essential hypertension Diltiazem,Avapro and metoprolol  5. Chronic diastolic congestive heart failure (HCC) Take Lasix PRN  6. S/P placement of cardiac pacemaker Follows with Cardiology 7Hypothyroidism due to acquired atrophy of thyroid TSH normal in 02/24 8HLD Statin LDL 63 in 02/24 9 Urinary Incontinence Gemtesa  10Non-seasonal allergic rhinitis due to other allergic trigger Atrovent and Flonase  Labs/tests ordered:   Next appt:  Visit date not found

## 2023-03-06 ENCOUNTER — Ambulatory Visit (HOSPITAL_COMMUNITY)
Admission: RE | Admit: 2023-03-06 | Discharge: 2023-03-06 | Disposition: A | Discharge status: Home / Self Care | Payer: Medicare Other | Source: Ambulatory Visit | Attending: Cardiology | Admitting: Cardiology

## 2023-03-06 DIAGNOSIS — R600 Localized edema: Secondary | ICD-10-CM | POA: Diagnosis not present

## 2023-03-20 ENCOUNTER — Other Ambulatory Visit: Payer: Self-pay | Admitting: Internal Medicine

## 2023-03-20 NOTE — Telephone Encounter (Signed)
Automated rx request

## 2023-04-09 NOTE — Progress Notes (Unsigned)
Cardiology Office Note Date:  04/09/2023  Patient ID:  Destiny [name], Destiny [name] [birth year], MRN [id number] PCP:  Mahlon Gammon, MD  Electrophysiologist: Dr. Johney Frame  ***refresh   Chief Complaint:  annual visit  History of Present Illness: Destiny [name] is a 89 y.o. female with history of HTN, HLD, hypothyroidism, advanced heart block w/PPM  She saw Dr. Johney Frame 04/15/22, doing OK, reported her SOB, edema as stable RV lead thresholds impedence over time,   RV pace/ sense function described as reasonably stable.   RV lead threshold is 2 @0 .4 msec today.   extended pulse width and is is programmed at 3.5V @0 .7 msec.  reduced sensitivity from 2.8 to 5  dependent and changed to DDDR today (MVP turned off). Not doing remotes > discussed importance of this.  Saw PMD service 02/28/23 mentions asymmetrical edema, planned for venous  >> was negative, not using her lasix 2/2 incontinence. Had been in the ER with hemorrhoidal bleeding  *** + remotes > CL is reading *** symptoms *** RV lead stable? 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 Matthias Hughs   Hemorrhoids 01/12/2023   High cholesterol    HTN (hypertension) 07/17/2015   Echo 08/2019: EF 50-55, grade 1 diastolic dysfunction, mild apical HK, normal RV SF, trivial pericardial effusion, moderate MAC, mild MR, mild TR   Hypothyroidism    Insomnia    Lactose intolerance    Lumbar degenerative disc disease    Myoview    Myoview 06/2019: EF 48, normal perfusion; low risk (EF normal by echo 10/2018)   Neuromuscular disorder (HCC)     Pronated ankles /Feet    Onychomycosis    Osteoarthritis    Osteoporosis    Second degree Mobitz II AV block    s/p PPM   Spinal stenosis    Thyroid disease    Varicose veins     Past Surgical History:  Procedure Laterality Date   ABDOMINAL HYSTERECTOMY     BLADDER REPAIR     blephoplasty bilateral  03/10/2014   CATARACT EXTRACTION W/ INTRAOCULAR LENS  IMPLANT, BILATERAL  7 & 8 2014   CHOLECYSTECTOMY     COLONOSCOPY  05/18/2012   EP IMPLANTABLE DEVICE N/A 08/16/2016   MDT Advisa MRI conditional PPM implanted by Dr Johney Frame for mobitz II second degree AV block   EYE SURGERY Right 05/02/2018   laser eye surgery   herniated disc repair     INCONTINENCE SURGERY     JOINT REPLACEMENT     LAMINECTOMY  02/2011   L3-L5  Dr. Danielle Dess   LUMBAR LAMINECTOMY/DECOMPRESSION MICRODISCECTOMY Right 09/21/2015   Procedure: Right Lumbar Three-FourMicrodiskectomy;  Surgeon: Barnett Abu, MD;  Location: [facility];  Service: Neurosurgery;  Laterality: Right;  Right L3-4 Microdiskectomy   SHOULDER ARTHROSCOPY WITH ROTATOR CUFF REPAIR Right 08/2003   TONSILLECTOMY     TOTAL HIP ARTHROPLASTY     right 1999    Current Outpatient Medications  Medication Sig Dispense Refill  Calcium-Phosphorus-Vitamin D 250-107-500 MG-MG-UNIT CHEW Chew 1 tablet by mouth daily.     diltiazem (CARDIZEM CD) 240 MG 24 hr capsule TAKE ONE CAPSULE BY MOUTH EVERY DAY 90 capsule 3   docusate sodium (COLACE) 100 MG capsule Take 1 capsule (100 mg total) by mouth daily. Had cramps with miralax, not true allergy. 30 capsule 1   dorzolamide-timolol (COSOPT) 22.3-6.8 MG/ML ophthalmic solution Place 1 drop into both eyes 2 (two) times daily.  5   fluticasone (FLONASE) 50 MCG/ACT nasal spray Place 2 sprays into both nostrils daily. 16 g 6   furosemide (LASIX) 20 MG tablet TAKE ONE TABLET BY MOUTH EVERY OTHER DAY. 15 tablet 1   hydrocortisone (ANUSOL-HC) 25 MG suppository Place 1 suppository (25 mg total) rectally 2 (two) times daily. 12  suppository 3   ipratropium (ATROVENT) 0.06 % nasal spray Place 1 spray into both nostrils 3 (three) times daily. 15 mL 3   irbesartan (AVAPRO) 300 MG tablet TAKE ONE TABLET BY MOUTH DAILY 90 tablet 3   LACTASE PO Take 1 tablet by mouth as needed (when eating dairy products). Take as directed     Loratadine 10 MG CAPS Take 1 capsule by mouth as needed.     LUMIGAN 0.01 % SOLN Place 1 drop into both eyes at bedtime.  98   metoprolol succinate (TOPROL-XL) 25 MG 24 hr tablet TAKE 1/2 TABLET BY MOUTH AT BEDTIME 15 tablet 5   Multiple Vitamins-Minerals (MULTIVITAMIN WITH MINERALS) tablet Take 1 tablet by mouth daily.     Netarsudil Dimesylate 0.02 % SOLN Apply 1 drop to eye at bedtime. Right eye     Probiotic Product (ALIGN) 4 MG CAPS Take by mouth daily.     rosuvastatin (CRESTOR) 5 MG tablet TAKE ONE TABLET BY MOUTH ONCE DAILY CHOLESTEROL 90 tablet 3   SYNTHROID 125 MCG tablet TAKE ONE TABLET BY MOUTH ONCE DAILY 90 tablet 3   UNABLE TO FIND Take by mouth as needed. Med Name: OTC purified peppermint oil for IBS - 3 caps before lunch and dinner     UNABLE TO FIND Med Name:Hemeopathic ear drops  2 drops as needed     Vibegron (GEMTESA) 75 MG TABS Take 1 tablet (75 mg total) by mouth daily. 30 tablet 11   No current facility-administered medications for this visit.    Allergies:   Codeine, Macrobid [nitrofurantoin], Miralax [polyethylene glycol], Other, Pain relief [acetaminophen], Paxil [paroxetine hcl], Percodan [oxycodone-aspirin], Oxycodone, Oxycodone-acetaminophen, and Penicillins   Social History:  The patient  reports that she has never smoked. She has never used smokeless tobacco. She reports that she does not drink alcohol and does not use drugs.   Family History:  The patient's family history includes Arthritis in her mother; Heart attack in her father; Osteoporosis in her mother.  ROS:  Please see the history of present illness.    All other systems are reviewed and otherwise negative.    PHYSICAL EXAM:  VS:  There were no vitals taken for this visit. BMI: There is no height or weight on file to calculate BMI. Well nourished, well developed, in no acute distress HEENT: normocephalic, atraumatic Neck: no JVD, carotid bruits or masses Cardiac:  *** RRR; no significant murmurs, no rubs, or gallops Lungs:  *** CTA b/l, no wheezing, rhonchi or rales Abd: soft, nontender MS: no deformity or *** atrophy Ext: *** no edema Skin: warm and dry, no rash Neuro:  No gross deficits appreciated Psych: euthymic mood, full affect  ***  PPM site is stable, no tethering or discomfort   EKG:  Done today and reviewed by myself shows      Device interrogation done today and reviewed by myself:  ***   09/19/19: TTE 1. Left ventricular ejection fraction, by visual estimation, is 50 to  55%. The left ventricle has low normal function. There is no left  ventricular hypertrophy.   2. Elevated left atrial pressure.   3. Left ventricular diastolic parameters are consistent with Grade I  diastolic dysfunction (impaired relaxation).   4. The left ventricle demonstrates regional wall motion abnormalities.   5. LVEF is approximately 50 to 55% with mild apical hypokinesis. Compared  to images from March 2020 there is no significant change.   6. Global right ventricle has normal systolic function.The right  ventricular size is normal. No increase in right ventricular wall  thickness.   7. Left atrial size was normal.   8. Right atrial size was normal.   9. Trivial pericardial effusion is present.  10. Moderate mitral annular calcification.  11. The mitral valve is abnormal. Mild mitral valve regurgitation.  12. The tricuspid valve is normal in structure.  13. The tricuspid valve is normal in structure. Tricuspid valve  regurgitation is mild.  14. The aortic valve is normal in structure. Aortic valve regurgitation is  not visualized.  15. The pulmonic valve was not well visualized.  Pulmonic valve  regurgitation is trivial.  16. Normal pulmonary artery systolic pressure.  17. A pacer wire is visualized.  18. The inferior vena cava is normal in size with greater than 50%  respiratory variability, suggesting right atrial pressure of 3 mmHg.    Recent Labs: 10/18/2022: ALT 26; TSH 0.63 01/12/2023: Hemoglobin 13.0; Platelets 215 02/21/2023: BUN 15; Creatinine 0.5; Potassium 4.2; Sodium 141  10/18/2022: Cholesterol 158; HDL 77; LDL Cholesterol 63; Triglycerides 86   CrCl cannot be calculated (Patient's most recent lab result is older than the maximum 21 days allowed.).   Wt Readings from Last 3 Encounters:  02/28/23 124 lb 9.6 oz (56.5 kg)  10/31/22 120 lb 9.6 oz (54.7 kg)  10/24/22 123 lb 3.2 oz (55.9 kg)     Other studies reviewed: Additional studies/records reviewed today include: summarized above  ASSESSMENT AND PLAN:  PPM ***  HTN ***  Disposition: F/u with ***  Current medicines are reviewed at length with the patient today.  The patient did not have any concerns regarding medicines.  Norma Fredrickson, PA-C 04/09/2023 4:59 PM     [facility] [address] [place] [address] [phone] (office)  [phone] (fax)

## 2023-04-12 ENCOUNTER — Ambulatory Visit: Payer: Medicare Other | Admitting: Physician Assistant

## 2023-04-12 ENCOUNTER — Encounter: Payer: Self-pay | Admitting: Physician Assistant

## 2023-04-12 VITALS — BP 140/80 | HR 80 | Ht 63.0 in | Wt 123.6 lb

## 2023-04-12 DIAGNOSIS — Z950 Presence of cardiac pacemaker: Secondary | ICD-10-CM

## 2023-04-12 DIAGNOSIS — I10 Essential (primary) hypertension: Secondary | ICD-10-CM

## 2023-04-12 DIAGNOSIS — R609 Edema, unspecified: Secondary | ICD-10-CM | POA: Diagnosis not present

## 2023-04-12 LAB — CUP PACEART INCLINIC DEVICE CHECK
Battery Remaining Longevity: 38 mo
Battery Voltage: 2.97 V
Brady Statistic AP VP Percent: 15.22 %
Brady Statistic AP VS Percent: 0.02 %
Brady Statistic AS VP Percent: 84.64 %
Brady Statistic AS VS Percent: 0.12 %
Brady Statistic RA Percent Paced: 15.04 %
Brady Statistic RV Percent Paced: 99.02 %
Date Time Interrogation Session: 20240814191817
Implantable Lead Connection Status: 753985
Implantable Lead Connection Status: 753985
Implantable Lead Implant Date: 20171219
Implantable Lead Implant Date: 20171219
Implantable Lead Location: 753859
Implantable Lead Location: 753860
Implantable Lead Model: 5076
Implantable Lead Model: 5076
Implantable Pulse Generator Implant Date: 20171219
Lead Channel Impedance Value: 1102 Ohm
Lead Channel Impedance Value: 1178 Ohm
Lead Channel Impedance Value: 285 Ohm
Lead Channel Impedance Value: 342 Ohm
Lead Channel Pacing Threshold Amplitude: 0.625 V
Lead Channel Pacing Threshold Amplitude: 1.5 V
Lead Channel Pacing Threshold Pulse Width: 0.4 ms
Lead Channel Pacing Threshold Pulse Width: 0.4 ms
Lead Channel Sensing Intrinsic Amplitude: 2.375 mV
Lead Channel Sensing Intrinsic Amplitude: 2.875 mV
Lead Channel Sensing Intrinsic Amplitude: 8.625 mV
Lead Channel Sensing Intrinsic Amplitude: 8.625 mV
Lead Channel Setting Pacing Amplitude: 2 V
Lead Channel Setting Pacing Amplitude: 3.25 V
Lead Channel Setting Pacing Pulse Width: 0.4 ms
Lead Channel Setting Sensing Sensitivity: 5.6 mV
Zone Setting Status: 755011
Zone Setting Status: 755011

## 2023-04-12 MED ORDER — FUROSEMIDE 20 MG PO TABS: 20.0000 mg | ORAL_TABLET | ORAL | 3 refills | Status: DC

## 2023-04-12 NOTE — Patient Instructions (Signed)
Medication Instructions:   Your physician recommends that you continue on your current medications as directed. Please refer to the Current Medication list given to you today.   *If you need a refill on your cardiac medications before your next appointment, please call your pharmacy*   Lab Work: NONE ORDERED  TODAY   If you have labs (blood work) drawn today and your tests are completely normal, you will receive your results only by: MyChart Message (if you have MyChart) OR A paper copy in the mail If you have any lab test that is abnormal or we need to change your treatment, we will call you to review the results.   Testing/Procedures: NONE ORDERED  TODAY      Follow-Up: At [facility], you and your health needs are our priority.  As part of our continuing mission to provide you with exceptional heart care, we have created designated Provider Care Teams.  These Care Teams include your primary Cardiologist (physician) and Advanced Practice Providers (APPs -  Physician Assistants and Nurse Practitioners) who all work together to provide you with the care you need, when you need it.  We recommend signing up for the patient portal called "MyChart".  Sign up information is provided on this After Visit Summary.  MyChart is used to connect with patients for Virtual Visits (Telemedicine).  Patients are able to view lab/test results, encounter notes, upcoming appointments, etc.  Non-urgent messages can be sent to your provider as well.   To learn more about what you can do with MyChart, go to [email or web address].    Your next appointment:   1 year(s)  Provider:   You may see Dr. Lalla Brothers or one of the following Advanced Practice Providers on your designated Care Team:     Other Instructions

## 2023-04-17 ENCOUNTER — Ambulatory Visit (INDEPENDENT_AMBULATORY_CARE_PROVIDER_SITE_OTHER): Payer: Medicare Other

## 2023-04-17 DIAGNOSIS — I441 Atrioventricular block, second degree: Secondary | ICD-10-CM

## 2023-04-18 ENCOUNTER — Other Ambulatory Visit: Payer: Self-pay | Admitting: Internal Medicine

## 2023-04-18 NOTE — Progress Notes (Signed)
Submitted PA for [employer] on Cover my Meds.   Received instant approval Status: Approved  Prior Auth: Coverage Start Date:04/17/2023; Coverage End Date:04/16/2024   APPROVAL HAS BEEN SCANNED

## 2023-04-20 DIAGNOSIS — H401131 Primary open-angle glaucoma, bilateral, mild stage: Secondary | ICD-10-CM | POA: Diagnosis not present

## 2023-04-27 NOTE — Progress Notes (Signed)
Remote pacemaker transmission.   

## 2023-06-01 DIAGNOSIS — Z23 Encounter for immunization: Secondary | ICD-10-CM | POA: Diagnosis not present

## 2023-06-30 NOTE — Progress Notes (Unsigned)
Location:  [facility]  POS: Clinic  Provider: Fletcher Anon, ANP  Code Status: DNR Goals of Care:     02/28/2023    1:23 PM  Advanced Directives  Does Patient Have a Medical Advance Directive? Yes  Type of Estate agent of [place];Out of facility DNR (pink MOST or yellow form)  Does patient want to make changes to medical advance directive? No - Patient declined  Copy of Healthcare Power of Attorney in Chart? No - copy requested     No chief complaint on file.   HPI: Patient is a 89 y.o. female seen today for medical management of chronic diseases.    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   Reports some neck pain which has been going on for quite some time but getting worse in the last week. Has been using ice which helps. No falls. Reports pain is 5/10 after aleve.  Not radiating no numbness or tingling.   Left shoulder has bone spurs with some pain used to take injections with Dr Rennis Chris but doesn't go anymore as the treatment was no longer effective.  Right shoulder has been repaired before.     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 Wears compression hose. Has seen vascular surgery, opted not to tx  Constipation: feels that she has some constipation did have a hard stool with blood afterward which resolved.  Past Medical History:  Diagnosis Date  . Allergy   . Arthritis   . 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 Cataract   . Chronic anxiety   . Constipation   . Diverticulosis of sigmoid colon   .  Emphysema of lung (HCC)   . GERD (gastroesophageal reflux disease)   . Glaucoma   . H/O adenomatous polyp of colon    Dr. Matthias Hughs  . Hemorrhoids 01/12/2023  . High cholesterol   . HTN (hypertension) 07/17/2015   Echo 08/2019: EF 50-55, grade 1 diastolic dysfunction, mild apical HK, normal RV SF, trivial pericardial effusion, moderate MAC, mild MR, mild TR  . Hypothyroidism   . Insomnia   . Lactose intolerance   . Lumbar degenerative disc disease   . Myoview    Myoview 06/2019: EF 48, normal perfusion; low risk (EF normal by echo 10/2018)  . Neuromuscular disorder (HCC)    Pronated ankles /Feet   . Onychomycosis   . Osteoarthritis   . Osteoporosis   . Second degree Mobitz II AV block    s/p PPM  . Spinal stenosis   . Thyroid disease   . Varicose veins     Past Surgical History:  Procedure Laterality Date  . ABDOMINAL HYSTERECTOMY    . BLADDER REPAIR    . blephoplasty bilateral  03/10/2014  . CATARACT EXTRACTION W/ INTRAOCULAR LENS  IMPLANT, BILATERAL  7 & 8 2014  . CHOLECYSTECTOMY    . COLONOSCOPY  05/18/2012  . EP IMPLANTABLE DEVICE N/A 08/16/2016   MDT Advisa MRI conditional PPM implanted by Dr Johney Frame for mobitz II second degree AV  block  . EYE SURGERY Right 05/02/2018   laser eye surgery  . herniated disc repair    . INCONTINENCE SURGERY    . JOINT REPLACEMENT    . LAMINECTOMY  02/2011   L3-L5  Dr. Danielle Dess  . LUMBAR LAMINECTOMY/DECOMPRESSION MICRODISCECTOMY Right 09/21/2015   Procedure: Right Lumbar Three-FourMicrodiskectomy;  Surgeon: Barnett Abu, MD;  Location: [facility];  Service: Neurosurgery;  Laterality: Right;  Right L3-4 Microdiskectomy  . SHOULDER ARTHROSCOPY WITH ROTATOR CUFF REPAIR Right 08/2003  . TONSILLECTOMY    . TOTAL HIP ARTHROPLASTY     right 1999    Allergies  Allergen Reactions  . Codeine Nausea Only  . Macrobid [Nitrofurantoin] Diarrhea  . Miralax [Polyethylene Glycol] Other (See Comments)    cramping  . Other Nausea And Vomiting and Other  (See Comments)  . Pain Relief [Acetaminophen] Nausea And Vomiting    Patient states she is not allergic   . Paxil [Paroxetine Hcl] Nausea Only  . Percodan [Oxycodone-Aspirin] Itching  . Oxycodone Nausea Only  . Oxycodone-Acetaminophen Nausea Only  . Penicillins Rash    Has patient had a PCN reaction causing immediate rash, facial/tongue/throat swelling, SOB or lightheadedness with hypotension: YES Has patient had a PCN reaction causing severe rash involving mucus membranes or skin necrosis: YES Has patient had a PCN reaction that required hospitalization NO Has patient had a PCN reaction occurring within the last 10 years: NO If all of the above answers are "NO", then may proceed with Cephalosporin use.    Outpatient Encounter Medications as of 07/03/2023  Medication Sig  . diltiazem (CARDIZEM CD) 240 MG 24 hr capsule TAKE ONE CAPSULE BY MOUTH EVERY DAY  . dorzolamide-timolol (COSOPT) 22.3-6.8 MG/ML ophthalmic solution Place 1 drop into both eyes 2 (two) times daily.  . furosemide (LASIX) 20 MG tablet Take 1 tablet (20 mg total) by mouth every other day.  . ipratropium (ATROVENT) 0.06 % nasal spray Place 1 spray into both nostrils 3 (three) times daily.  . irbesartan (AVAPRO) 300 MG tablet TAKE ONE TABLET BY MOUTH DAILY  . LUMIGAN 0.01 % SOLN Place 1 drop into both eyes at bedtime.  . metoprolol succinate (TOPROL-XL) 25 MG 24 hr tablet TAKE 1/2 TABLET BY MOUTH AT BEDTIME  . Multiple Vitamins-Minerals (MULTIVITAMIN WITH MINERALS) tablet Take 1 tablet by mouth daily.  . Netarsudil Dimesylate 0.02 % SOLN Apply 1 drop to eye at bedtime. Right eye  . Probiotic Product (ALIGN) 4 MG CAPS Take by mouth daily.  . rosuvastatin (CRESTOR) 5 MG tablet TAKE ONE TABLET BY MOUTH ONCE DAILY CHOLESTEROL  . SYNTHROID 125 MCG tablet TAKE ONE TABLET BY MOUTH ONCE DAILY  . UNABLE TO FIND Take by mouth as needed. Med Name: OTC purified peppermint oil for IBS - 3 caps before lunch and dinner  . Vibegron  (GEMTESA) 75 MG TABS Take 1 tablet (75 mg total) by mouth daily.   No facility-administered encounter medications on file as of 07/03/2023.    Review of Systems:  Review of Systems  Constitutional:  Negative for activity change, appetite change, chills, diaphoresis, fatigue, fever and unexpected weight change.  HENT:  Negative for congestion.   Respiratory:  Negative for cough, shortness of breath and wheezing.   Cardiovascular:  Positive for leg swelling. Negative for chest pain and palpitations.  Gastrointestinal:  Positive for constipation. Negative for abdominal distention, abdominal pain and diarrhea.  Genitourinary:  Negative for difficulty urinating and dysuria.  Musculoskeletal:  Positive for gait problem and neck pain.  Negative for arthralgias, back pain, joint swelling and myalgias.  Neurological:  Negative for dizziness, tremors, seizures, syncope, facial asymmetry, speech difficulty, weakness, light-headedness, numbness and headaches.  Psychiatric/Behavioral:  Negative for agitation, behavioral problems and confusion.     Health Maintenance  Topic Date Due  . INFLUENZA VACCINE  03/30/2023  . COVID-19 Vaccine (6 - 2023-24 season) 04/30/2023  . Pneumonia Vaccine 68+ Years old (2 of 2 - PCV) 07/27/2023 (Originally 07/15/2015)  . Medicare Annual Wellness (AWV)  09/20/2023  . DTaP/Tdap/Td (5 - Td or Tdap) 05/10/2031  . DEXA SCAN  Completed  . Zoster Vaccines- Shingrix  Completed  . HPV VACCINES  Aged Out    Physical Exam: There were no vitals filed for this visit.  There is no height or weight on file to calculate BMI. Physical Exam Vitals and nursing note reviewed.  Constitutional:      General: She is not in acute distress.    Appearance: She is not diaphoretic.  HENT:     Head: Normocephalic and atraumatic.     Right Ear: Tympanic membrane normal.     Left Ear: Tympanic membrane normal.     Nose: Nose normal.     Mouth/Throat:     Mouth: Mucous membranes are  moist.     Pharynx: Oropharynx is clear.  Eyes:     Conjunctiva/sclera: Conjunctivae normal.     Pupils: Pupils are equal, round, and reactive to light.  Neck:     Vascular: No JVD.  Cardiovascular:     Rate and Rhythm: Normal rate and regular rhythm.     Heart sounds: No murmur heard. Pulmonary:     Effort: Pulmonary effort is normal. No respiratory distress.     Breath sounds: Normal breath sounds. No wheezing.  Abdominal:     General: Bowel sounds are normal. There is no distension.     Palpations: Abdomen is soft.     Tenderness: There is no abdominal tenderness.  Musculoskeletal:     Comments: BLE edema +2 on right +1 let Varicose veins R>L  Skin:    General: Skin is warm and dry.  Neurological:     Mental Status: She is alert and oriented to person, place, and time.  Psychiatric:        Mood and Affect: Mood normal.    Labs reviewed: Basic Metabolic Panel: Recent Labs    10/18/22 0000 02/21/23 0000  NA 145 141  K 4.0 4.2  CL 106 106  CO2 24* 23*  BUN 18 15  CREATININE 0.6 0.5  CALCIUM 10.0 9.7  TSH 0.63  --    Liver Function Tests: Recent Labs    10/18/22 0000  AST 28  ALT 26  ALKPHOS 69  ALBUMIN 4.3   No results for input(s): "LIPASE", "AMYLASE" in the last 8760 hours. No results for input(s): "AMMONIA" in the last 8760 hours. CBC: Recent Labs    01/12/23 1609  WBC 6.7  HGB 13.0  HCT 40.5  MCV 94.0  PLT 215   Lipid Panel: Recent Labs    10/18/22 0000  CHOL 158  HDL 77*  LDLCALC 63  TRIG 86   Lab Results  Component Value Date   HGBA1C 5.8 10/12/2021    Procedures since last visit: No results found.  Assessment/Plan  1. Neck pain Hx of DDD and anterolisthesis per CT scan  Will try prednisone due to significant pain not relieved with aleve or tylenol If no improvement refer to OT - predniSONE (  DELTASONE) 20 MG tablet; Take 1 tablet (20 mg total) by mouth 2 (two) times daily with a meal for 5 days.  Dispense: 10 tablet; Refill:  0  2. Slow transit constipation  - docusate sodium (COLACE) 100 MG capsule; Take 1 capsule (100 mg total) by mouth daily. Had cramps with miralax, not true allergy.  Dispense: 30 capsule; Refill: 1  3. Venous insufficiency of both lower extremities Continues compression hose Lasix qod   4. Second degree Mobitz II AV block S/p pacemaker   5. HTN Controlled Continue Avapro, cardizem, lasix  6. Hypothyroidism  Continue synthroid  Labs/tests ordered:  * No order type specified *BMP prior to apt Next appt:  4 months with Dr Chales Abrahams   Total time :  time greater than 50% of total time spent doing pt counseling and coordination of care

## 2023-07-03 ENCOUNTER — Encounter: Payer: Self-pay | Admitting: Adult Health

## 2023-07-03 ENCOUNTER — Non-Acute Institutional Stay: Payer: Medicare Other | Admitting: Adult Health

## 2023-07-03 VITALS — BP 128/66 | HR 88 | Temp 97.8°F | Resp 16 | Ht 63.0 in | Wt 122.8 lb

## 2023-07-03 DIAGNOSIS — E7849 Other hyperlipidemia: Secondary | ICD-10-CM | POA: Diagnosis not present

## 2023-07-03 DIAGNOSIS — G8929 Other chronic pain: Secondary | ICD-10-CM

## 2023-07-03 DIAGNOSIS — N3281 Overactive bladder: Secondary | ICD-10-CM | POA: Diagnosis not present

## 2023-07-03 DIAGNOSIS — M25511 Pain in right shoulder: Secondary | ICD-10-CM | POA: Diagnosis not present

## 2023-07-03 DIAGNOSIS — I872 Venous insufficiency (chronic) (peripheral): Secondary | ICD-10-CM

## 2023-07-03 DIAGNOSIS — J3089 Other allergic rhinitis: Secondary | ICD-10-CM

## 2023-07-03 DIAGNOSIS — I441 Atrioventricular block, second degree: Secondary | ICD-10-CM

## 2023-07-03 DIAGNOSIS — M810 Age-related osteoporosis without current pathological fracture: Secondary | ICD-10-CM | POA: Diagnosis not present

## 2023-07-03 DIAGNOSIS — M25512 Pain in left shoulder: Secondary | ICD-10-CM

## 2023-07-03 DIAGNOSIS — E034 Atrophy of thyroid (acquired): Secondary | ICD-10-CM

## 2023-07-03 NOTE — Patient Instructions (Signed)
Recommend flu vaccine.

## 2023-07-05 ENCOUNTER — Other Ambulatory Visit: Payer: Self-pay | Admitting: Internal Medicine

## 2023-07-05 DIAGNOSIS — M810 Age-related osteoporosis without current pathological fracture: Secondary | ICD-10-CM

## 2023-07-05 MED ORDER — DENOSUMAB 60 MG/ML ~~LOC~~ SOSY
60.0000 mg | PREFILLED_SYRINGE | Freq: Once | SUBCUTANEOUS | Status: DC
Start: 2023-07-11 — End: 2023-07-11

## 2023-07-06 DIAGNOSIS — L821 Other seborrheic keratosis: Secondary | ICD-10-CM | POA: Diagnosis not present

## 2023-07-06 DIAGNOSIS — D225 Melanocytic nevi of trunk: Secondary | ICD-10-CM | POA: Diagnosis not present

## 2023-07-06 DIAGNOSIS — L578 Other skin changes due to chronic exposure to nonionizing radiation: Secondary | ICD-10-CM | POA: Diagnosis not present

## 2023-07-06 DIAGNOSIS — Z85828 Personal history of other malignant neoplasm of skin: Secondary | ICD-10-CM | POA: Diagnosis not present

## 2023-07-06 DIAGNOSIS — R609 Edema, unspecified: Secondary | ICD-10-CM | POA: Diagnosis not present

## 2023-07-06 DIAGNOSIS — L570 Actinic keratosis: Secondary | ICD-10-CM | POA: Diagnosis not present

## 2023-07-06 DIAGNOSIS — D2230 Melanocytic nevi of unspecified part of face: Secondary | ICD-10-CM | POA: Diagnosis not present

## 2023-07-11 ENCOUNTER — Ambulatory Visit: Payer: Medicare Other

## 2023-07-11 DIAGNOSIS — M810 Age-related osteoporosis without current pathological fracture: Secondary | ICD-10-CM

## 2023-07-11 DIAGNOSIS — I872 Venous insufficiency (chronic) (peripheral): Secondary | ICD-10-CM | POA: Diagnosis not present

## 2023-07-11 LAB — BASIC METABOLIC PANEL
BUN: 13 (ref 4–21)
CO2: 23 — AB (ref 13–22)
Chloride: 106 (ref 99–108)
Creatinine: 0.6 (ref 0.5–1.1)
Glucose: 94
Potassium: 4.2 meq/L (ref 3.5–5.1)
Sodium: 141 (ref 137–147)

## 2023-07-11 LAB — CBC: RBC: 4.21 (ref 3.87–5.11)

## 2023-07-11 LAB — CBC AND DIFFERENTIAL
EGFR: 85
HCT: 40 (ref 36–46)
Hemoglobin: 13.5 (ref 12.0–16.0)
Platelets: 209 10*3/uL (ref 150–400)
WBC: 6.5

## 2023-07-11 LAB — COMPREHENSIVE METABOLIC PANEL: Calcium: 9.9 (ref 8.7–10.7)

## 2023-07-11 MED ORDER — DENOSUMAB 60 MG/ML ~~LOC~~ SOSY
60.0000 mg | PREFILLED_SYRINGE | Freq: Once | SUBCUTANEOUS | Status: DC
Start: 2024-01-10 — End: 2023-07-11

## 2023-07-11 MED ORDER — DENOSUMAB 60 MG/ML ~~LOC~~ SOSY
60.0000 mg | PREFILLED_SYRINGE | Freq: Once | SUBCUTANEOUS | Status: AC
Start: 2024-01-09 — End: 2024-01-09
  Administered 2024-01-09: 60 mg via SUBCUTANEOUS

## 2023-07-11 MED ORDER — DENOSUMAB 60 MG/ML ~~LOC~~ SOSY
60.0000 mg | PREFILLED_SYRINGE | Freq: Once | SUBCUTANEOUS | Status: AC
Start: 2023-07-11 — End: 2023-07-11
  Administered 2023-07-11: 60 mg via SUBCUTANEOUS

## 2023-07-11 NOTE — Addendum Note (Signed)
Addended by: Maurice Small on: 07/11/2023 02:46 PM   Modules accepted: Orders

## 2023-07-11 NOTE — Addendum Note (Signed)
Addended by: Maurice Small on: 07/11/2023 02:59 PM   Modules accepted: Orders

## 2023-07-13 ENCOUNTER — Telehealth: Payer: Self-pay | Admitting: Adult Health

## 2023-07-13 NOTE — Telephone Encounter (Signed)
Called and discussed with patient. CBC and BMP are WNL.  She said she had a virus with nasal congestion present for 1 day. I let her know to monitor her symptoms and call back if not improving.  Also she had some neck pain which is now resolved. She declined any further intervention for this.

## 2023-07-17 ENCOUNTER — Ambulatory Visit (INDEPENDENT_AMBULATORY_CARE_PROVIDER_SITE_OTHER): Payer: Medicare Other

## 2023-07-17 DIAGNOSIS — I441 Atrioventricular block, second degree: Secondary | ICD-10-CM | POA: Diagnosis not present

## 2023-07-18 LAB — CUP PACEART REMOTE DEVICE CHECK
Battery Remaining Longevity: 41 mo
Battery Voltage: 2.97 V
Brady Statistic AP VP Percent: 21.07 %
Brady Statistic AP VS Percent: 0 %
Brady Statistic AS VP Percent: 78.81 %
Brady Statistic AS VS Percent: 0.12 %
Brady Statistic RA Percent Paced: 20.83 %
Brady Statistic RV Percent Paced: 99.15 %
Date Time Interrogation Session: 20241118124151
Implantable Lead Connection Status: 753985
Implantable Lead Connection Status: 753985
Implantable Lead Implant Date: 20171219
Implantable Lead Implant Date: 20171219
Implantable Lead Location: 753859
Implantable Lead Location: 753860
Implantable Lead Model: 5076
Implantable Lead Model: 5076
Implantable Pulse Generator Implant Date: 20171219
Lead Channel Impedance Value: 1121 Ohm
Lead Channel Impedance Value: 1197 Ohm
Lead Channel Impedance Value: 285 Ohm
Lead Channel Impedance Value: 342 Ohm
Lead Channel Pacing Threshold Amplitude: 0.625 V
Lead Channel Pacing Threshold Amplitude: 1.5 V
Lead Channel Pacing Threshold Pulse Width: 0.4 ms
Lead Channel Pacing Threshold Pulse Width: 0.4 ms
Lead Channel Sensing Intrinsic Amplitude: 3 mV
Lead Channel Sensing Intrinsic Amplitude: 3 mV
Lead Channel Sensing Intrinsic Amplitude: 8.75 mV
Lead Channel Sensing Intrinsic Amplitude: 8.75 mV
Lead Channel Setting Pacing Amplitude: 2 V
Lead Channel Setting Pacing Amplitude: 3 V
Lead Channel Setting Pacing Pulse Width: 0.4 ms
Lead Channel Setting Sensing Sensitivity: 5.6 mV
Zone Setting Status: 755011
Zone Setting Status: 755011

## 2023-07-20 ENCOUNTER — Other Ambulatory Visit: Payer: Self-pay | Admitting: Internal Medicine

## 2023-08-10 NOTE — Progress Notes (Signed)
Remote pacemaker transmission.   

## 2023-08-10 NOTE — Addendum Note (Signed)
Addended by: Geralyn Flash D on: 08/10/2023 12:12 PM   Modules accepted: Orders

## 2023-08-28 ENCOUNTER — Other Ambulatory Visit: Payer: Self-pay | Admitting: Internal Medicine

## 2023-09-19 ENCOUNTER — Other Ambulatory Visit: Payer: Self-pay | Admitting: Internal Medicine

## 2023-09-28 ENCOUNTER — Ambulatory Visit: Payer: Medicare Other | Admitting: Obstetrics and Gynecology

## 2023-09-29 ENCOUNTER — Encounter: Payer: Self-pay | Admitting: Obstetrics and Gynecology

## 2023-09-29 ENCOUNTER — Ambulatory Visit (INDEPENDENT_AMBULATORY_CARE_PROVIDER_SITE_OTHER): Payer: Medicare Other | Admitting: Obstetrics and Gynecology

## 2023-09-29 DIAGNOSIS — N3281 Overactive bladder: Secondary | ICD-10-CM | POA: Diagnosis not present

## 2023-09-29 MED ORDER — GEMTESA 75 MG PO TABS: 1.0000 | ORAL_TABLET | Freq: Every day | ORAL | 11 refills | Status: DC

## 2023-09-29 NOTE — Progress Notes (Signed)
[facility] Return Visit  SUBJECTIVE  History of Present Illness: Destiny [name] is a 89 y.o. female seen in follow-up for overactive bladder. She is currently on Gemtesa 75mg .  She feels that she is voiding about every 2 hours during the day. Her leakage has decreased overall. Only wakes up once at night.  Drinks: 1 cup coffee, small glass of tea, otherwise drinks water  Taking psyllium fiber to help prevent bowel leakage.   Past Medical History: Patient  has a past medical history of Allergy, Arthritis, Bilateral carotid bruits (03/2003), Cataract, Chronic anxiety, Constipation, Diverticulosis of sigmoid colon, Emphysema of lung (HCC), GERD (gastroesophageal reflux disease), Glaucoma, H/O adenomatous polyp of colon, Hemorrhoids (01/12/2023), High cholesterol, HTN (hypertension) (07/17/2015), Hypothyroidism, Insomnia, Lactose intolerance, Lumbar degenerative disc disease, Myoview, Neuromuscular disorder (HCC), Onychomycosis, Osteoarthritis, Osteoporosis, Second degree Mobitz II AV block, Spinal stenosis, Thyroid disease, and Varicose veins.   Past Surgical History: She  has a past surgical history that includes Cholecystectomy; Joint replacement; Abdominal hysterectomy; Shoulder arthroscopy with rotator cuff repair (Right, 08/2003); Bladder repair; Laminectomy (02/2011); Incontinence surgery; Total hip arthroplasty; Colonoscopy (05/18/2012); Cataract extraction w/ intraocular lens  implant, bilateral (7 & 8 2014); blephoplasty bilateral (03/10/2014); Lumbar laminectomy/decompression microdiscectomy (Right, 09/21/2015); Tonsillectomy; herniated disc repair; Cardiac catheterization (N/A, 08/16/2016); and Eye surgery (Right, 05/02/2018).   Medications: She has a current medication list which includes the following prescription(s): diltiazem, dorzolamide-timolol, furosemide, ipratropium, irbesartan, lumigan, metoprolol succinate, multivitamin with minerals, netarsudil dimesylate, align,  rosuvastatin, synthroid, UNABLE TO FIND, and gemtesa, and the following Facility-Administered Medications: [START ON 01/09/2024] denosumab.   Allergies: Patient is allergic to codeine, macrobid [nitrofurantoin], miralax [polyethylene glycol], other, pain relief [acetaminophen], paxil [paroxetine hcl], percodan [oxycodone-aspirin], oxycodone, oxycodone-acetaminophen, and penicillins.   Social History: Patient  reports that she has never smoked. She has never used smokeless tobacco. She reports that she does not drink alcohol and does not use drugs.      OBJECTIVE     Physical Exam: Vitals:   09/29/23 1332  BP: (!) 150/70  Pulse: 84     Gen: No apparent distress, A&O x 3.  Detailed Urogynecologic Evaluation:  Deferred.     ASSESSMENT AND PLAN    [name] is a 89 y.o. with:  1. Overactive bladder      - Continue with Gemtesa 75mg  daily, refill provided. Overall has continued improvement and she does not desire to change treatment at this time.   Return 1 year or sooner if needed.   Marguerita Beards, MD  Time spent: I spent 20 minutes dedicated to the care of this patient on the date of this encounter to include pre-visit review of records, face-to-face time with the patient and post visit documentation and ordering medication/ testing.

## 2023-10-16 ENCOUNTER — Ambulatory Visit (INDEPENDENT_AMBULATORY_CARE_PROVIDER_SITE_OTHER): Payer: Medicare Other

## 2023-10-16 DIAGNOSIS — I441 Atrioventricular block, second degree: Secondary | ICD-10-CM

## 2023-10-17 LAB — CUP PACEART REMOTE DEVICE CHECK
Battery Remaining Longevity: 36 mo
Battery Voltage: 2.96 V
Brady Statistic AP VP Percent: 13.91 %
Brady Statistic AP VS Percent: 0 %
Brady Statistic AS VP Percent: 85.96 %
Brady Statistic AS VS Percent: 0.12 %
Brady Statistic RA Percent Paced: 13.88 %
Brady Statistic RV Percent Paced: 99.73 %
Date Time Interrogation Session: 20250217124310
Implantable Lead Connection Status: 753985
Implantable Lead Connection Status: 753985
Implantable Lead Implant Date: 20171219
Implantable Lead Implant Date: 20171219
Implantable Lead Location: 753859
Implantable Lead Location: 753860
Implantable Lead Model: 5076
Implantable Lead Model: 5076
Implantable Pulse Generator Implant Date: 20171219
Lead Channel Impedance Value: 1121 Ohm
Lead Channel Impedance Value: 1197 Ohm
Lead Channel Impedance Value: 285 Ohm
Lead Channel Impedance Value: 323 Ohm
Lead Channel Pacing Threshold Amplitude: 0.625 V
Lead Channel Pacing Threshold Amplitude: 1.5 V
Lead Channel Pacing Threshold Pulse Width: 0.4 ms
Lead Channel Pacing Threshold Pulse Width: 0.4 ms
Lead Channel Sensing Intrinsic Amplitude: 12.625 mV
Lead Channel Sensing Intrinsic Amplitude: 12.625 mV
Lead Channel Sensing Intrinsic Amplitude: 3.125 mV
Lead Channel Sensing Intrinsic Amplitude: 3.125 mV
Lead Channel Setting Pacing Amplitude: 2 V
Lead Channel Setting Pacing Amplitude: 3 V
Lead Channel Setting Pacing Pulse Width: 0.4 ms
Lead Channel Setting Sensing Sensitivity: 5.6 mV
Zone Setting Status: 755011
Zone Setting Status: 755011

## 2023-10-23 DIAGNOSIS — Z961 Presence of intraocular lens: Secondary | ICD-10-CM | POA: Diagnosis not present

## 2023-10-23 DIAGNOSIS — H401131 Primary open-angle glaucoma, bilateral, mild stage: Secondary | ICD-10-CM | POA: Diagnosis not present

## 2023-10-24 ENCOUNTER — Other Ambulatory Visit: Payer: Self-pay | Admitting: Internal Medicine

## 2023-11-23 NOTE — Progress Notes (Signed)
 Remote pacemaker transmission.

## 2023-11-23 NOTE — Addendum Note (Signed)
 Addended by: Geralyn Flash D on: 11/23/2023 03:39 PM   Modules accepted: Orders

## 2023-11-26 ENCOUNTER — Other Ambulatory Visit: Payer: Self-pay | Admitting: Physician Assistant

## 2024-01-02 ENCOUNTER — Non-Acute Institutional Stay: Payer: Medicare Other | Admitting: Internal Medicine

## 2024-01-02 ENCOUNTER — Encounter: Payer: Self-pay | Admitting: Internal Medicine

## 2024-01-02 VITALS — BP 138/68 | HR 72 | Temp 98.1°F | Resp 20 | Ht 63.0 in | Wt 122.4 lb

## 2024-01-02 DIAGNOSIS — M810 Age-related osteoporosis without current pathological fracture: Secondary | ICD-10-CM

## 2024-01-02 DIAGNOSIS — E034 Atrophy of thyroid (acquired): Secondary | ICD-10-CM | POA: Diagnosis not present

## 2024-01-02 DIAGNOSIS — R350 Frequency of micturition: Secondary | ICD-10-CM | POA: Diagnosis not present

## 2024-01-02 DIAGNOSIS — H60501 Unspecified acute noninfective otitis externa, right ear: Secondary | ICD-10-CM

## 2024-01-02 MED ORDER — NEOMYCIN-POLYMYXIN-HC 1 % OT SOLN: 3.0000 [drp] | Freq: Three times a day (TID) | OTIC | 0 refills | Status: DC

## 2024-01-02 NOTE — Progress Notes (Unsigned)
 Location:  [facility] features editor of Service:  Clinic (12)  Provider:   Code Status: *** Goals of Care:     07/03/2023    2:45 PM  Advanced Directives  Does Patient Have a Medical Advance Directive? Yes  Type of Advance Directive Living will;Healthcare Power of [place];Out of facility DNR (pink MOST or yellow form)  Does patient want to make changes to medical advance directive? No - Patient declined  Copy of Healthcare Power of Attorney in Chart? No - copy requested     Chief Complaint  Patient presents with   Follow-up    Patient has concerns having frequent trips to the bathroom to urinate.    HPI: Patient is a 89 y.o. female seen today for medical management of chronic diseases.     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 Dellis Fermo   Hemorrhoids 01/12/2023   High cholesterol    HTN (hypertension) 07/17/2015   Echo 08/2019: EF 50-55, grade 1 diastolic dysfunction, mild apical HK, normal RV SF, trivial pericardial effusion, moderate MAC, mild MR, mild TR   Hypothyroidism    Insomnia    Lactose intolerance    Lumbar degenerative disc disease    Myoview     Myoview  06/2019: EF 48, normal perfusion; low risk (EF normal by echo 10/2018)   Neuromuscular disorder (HCC)    Pronated ankles /Feet    Onychomycosis    Osteoarthritis    Osteoporosis    Second degree Mobitz II AV block    s/p PPM   Spinal stenosis    Thyroid  disease    Varicose veins     Past Surgical History:  Procedure Laterality Date   ABDOMINAL HYSTERECTOMY     BLADDER REPAIR     blephoplasty bilateral  03/10/2014   CATARACT EXTRACTION W/ INTRAOCULAR LENS   IMPLANT, BILATERAL  7 & 8 2014   CHOLECYSTECTOMY     COLONOSCOPY  05/18/2012   EP IMPLANTABLE DEVICE N/A 08/16/2016   MDT Advisa MRI conditional PPM implanted by Dr Nunzio Belch for mobitz II second degree AV block   EYE SURGERY Right 05/02/2018   laser eye surgery   herniated disc repair     INCONTINENCE SURGERY     JOINT REPLACEMENT     LAMINECTOMY  02/2011   L3-L5  Dr. Ellery Guthrie   LUMBAR LAMINECTOMY/DECOMPRESSION MICRODISCECTOMY Right 09/21/2015   Procedure: Right Lumbar Three-FourMicrodiskectomy;  Surgeon: Elna Haggis, MD;  Location: [facility];  Service: Neurosurgery;  Laterality: Right;  Right L3-4 Microdiskectomy   SHOULDER ARTHROSCOPY WITH ROTATOR CUFF REPAIR Right 08/2003   TONSILLECTOMY     TOTAL HIP ARTHROPLASTY     right 1999    Allergies  Allergen Reactions   Codeine Nausea Only   Macrobid [Nitrofurantoin] Diarrhea   Miralax [Polyethylene Glycol] Other (See Comments)    cramping   Other Nausea And Vomiting and Other (See Comments)   Pain Relief [Acetaminophen ] Nausea And Vomiting    Patient states she is not allergic    Paxil [Paroxetine Hcl] Nausea Only   Percodan [Oxycodone -Aspirin] Itching  Oxycodone  Nausea Only   Oxycodone -Acetaminophen  Nausea Only   Penicillins Rash    Has patient had a PCN reaction causing immediate rash, facial/tongue/throat swelling, SOB or lightheadedness with hypotension: YES Has patient had a PCN reaction causing severe rash involving mucus membranes or skin necrosis: YES Has patient had a PCN reaction that required hospitalization NO Has patient had a PCN reaction occurring within the last 10 years: NO If all of the above answers are "NO", then may proceed with Cephalosporin use.    Outpatient Encounter Medications as of 01/02/2024  Medication Sig   diltiazem  (CARDIZEM  CD) 240 MG 24 hr capsule TAKE ONE CAPSULE BY MOUTH EVERY DAY   dorzolamide -timolol  (COSOPT ) 22.3-6.8 MG/ML ophthalmic solution Place 1 drop into both eyes 2 (two) times daily.    furosemide  (LASIX ) 20 MG tablet Take 1 tablet (20 mg total) by mouth every other day.   ipratropium (ATROVENT ) 0.06 % nasal spray Place 1 spray into both nostrils 3 (three) times daily.   irbesartan  (AVAPRO ) 300 MG tablet TAKE ONE TABLET BY MOUTH DAILY   LUMIGAN 0.01 % SOLN Place 1 drop into both eyes at bedtime.   metoprolol  succinate (TOPROL -XL) 25 MG 24 hr tablet TAKE 1/2 TABLET BY MOUTH AT BEDTIME   Multiple Vitamins-Minerals (MULTIVITAMIN WITH MINERALS) tablet Take 1 tablet by mouth daily.   Netarsudil Dimesylate 0.02 % SOLN Apply 1 drop to eye at bedtime. Right eye   Probiotic Product (ALIGN) 4 MG CAPS Take by mouth daily.   rosuvastatin  (CRESTOR ) 5 MG tablet TAKE ONE TABLET BY MOUTH ONCE DAILY CHOLESTEROL   SYNTHROID  125 MCG tablet TAKE ONE TABLET BY MOUTH ONCE DAILY   UNABLE TO FIND Take by mouth as needed. Med Name: OTC purified peppermint oil for IBS - 3 caps before lunch and dinner   Vibegron  (GEMTESA ) 75 MG TABS Take 1 tablet (75 mg total) by mouth daily.   Facility-Administered Encounter Medications as of 01/02/2024  Medication   [START ON 01/09/2024] denosumab  (PROLIA ) injection 60 mg    Review of Systems:  Review of Systems  Health Maintenance  Topic Date Due   Pneumonia Vaccine 71+ Years old (2 of 2 - PCV) 07/15/2015   COVID-19 Vaccine (7 - 2024-25 season) 06/28/2024 (Originally 08/15/2023)   Medicare Annual Wellness (AWV)  08/28/2024 (Originally 09/20/2023)   INFLUENZA VACCINE  03/29/2024   DTaP/Tdap/Td (5 - Td or Tdap) 05/10/2031   DEXA SCAN  Completed   Zoster Vaccines- Shingrix  Completed   HPV VACCINES  Aged Out   Meningococcal B Vaccine  Aged Out    Physical Exam: Vitals:   01/02/24 1246  BP: 138/68  Pulse: 72  Resp: 20  Temp: 98.1 F (36.7 C)  SpO2: 97%  Weight: 122 lb 6.4 oz (55.5 kg)  Height: 5\' 3"  (1.6 m)   Body mass index is 21.68 kg/m. Physical Exam  Labs reviewed: Basic Metabolic Panel: Recent Labs    02/21/23 0000 07/11/23 0000  NA  141 141  K 4.2 4.2  CL 106 106  CO2 23* 23*  BUN 15 13  CREATININE 0.5 0.6  CALCIUM  9.7 9.9   Liver Function Tests: No results for input(s): "AST", "ALT", "ALKPHOS", "BILITOT", "PROT", "ALBUMIN" in the last 8760 hours. No results for input(s): "LIPASE", "AMYLASE" in the last 8760 hours. No results for input(s): "AMMONIA" in the last 8760 hours. CBC: Recent Labs    01/12/23 1609 07/11/23 0000  WBC 6.7 6.5  HGB 13.0 13.5  HCT 40.5 40  MCV 94.0  --  PLT 215 209   Lipid Panel: No results for input(s): "CHOL", "HDL", "LDLCALC", "TRIG", "CHOLHDL", "LDLDIRECT" in the last 8760 hours. Lab Results  Component Value Date   HGBA1C 5.8 10/12/2021    Procedures since last visit: No results found.  Assessment/Plan There are no diagnoses linked to this encounter.   Labs/tests ordered:  * No order type specified * Next appt:  01/09/2024

## 2024-01-04 DIAGNOSIS — I5032 Chronic diastolic (congestive) heart failure: Secondary | ICD-10-CM | POA: Diagnosis not present

## 2024-01-04 DIAGNOSIS — I10 Essential (primary) hypertension: Secondary | ICD-10-CM | POA: Diagnosis not present

## 2024-01-04 DIAGNOSIS — R300 Dysuria: Secondary | ICD-10-CM | POA: Diagnosis not present

## 2024-01-04 DIAGNOSIS — E034 Atrophy of thyroid (acquired): Secondary | ICD-10-CM | POA: Diagnosis not present

## 2024-01-04 DIAGNOSIS — E559 Vitamin D deficiency, unspecified: Secondary | ICD-10-CM | POA: Diagnosis not present

## 2024-01-04 LAB — HEPATIC FUNCTION PANEL
ALT: 27 U/L (ref 7–35)
AST: 33 (ref 13–35)
Alkaline Phosphatase: 70 (ref 25–125)
Bilirubin, Total: 0.5

## 2024-01-04 LAB — CBC AND DIFFERENTIAL
HCT: 41 (ref 36–46)
Hemoglobin: 13.3 (ref 12.0–16.0)
Platelets: 219 10*3/uL (ref 150–400)
WBC: 5.7

## 2024-01-04 LAB — COMPREHENSIVE METABOLIC PANEL WITH GFR
Albumin: 4.5 (ref 3.5–5.0)
Calcium: 10.1 (ref 8.7–10.7)
Globulin: 2.1
eGFR: 85

## 2024-01-04 LAB — LIPID PANEL
Cholesterol: 165 (ref 0–200)
HDL: 81 — AB (ref 35–70)
LDL Cholesterol: 68
LDl/HDL Ratio: 2
Triglycerides: 79 (ref 40–160)

## 2024-01-04 LAB — CBC: RBC: 4.38 (ref 3.87–5.11)

## 2024-01-04 LAB — BASIC METABOLIC PANEL WITH GFR
BUN: 14 (ref 4–21)
CO2: 23 — AB (ref 13–22)
Chloride: 107 (ref 99–108)
Creatinine: 0.6 (ref 0.5–1.1)
Glucose: 113
Potassium: 4.2 meq/L (ref 3.5–5.1)
Sodium: 142 (ref 137–147)

## 2024-01-04 LAB — VITAMIN D 25 HYDROXY (VIT D DEFICIENCY, FRACTURES): Vit D, 25-Hydroxy: 33.9

## 2024-01-09 ENCOUNTER — Ambulatory Visit (INDEPENDENT_AMBULATORY_CARE_PROVIDER_SITE_OTHER): Payer: Medicare Other

## 2024-01-09 DIAGNOSIS — M810 Age-related osteoporosis without current pathological fracture: Secondary | ICD-10-CM | POA: Diagnosis not present

## 2024-01-09 MED ORDER — DENOSUMAB 60 MG/ML ~~LOC~~ SOSY
60.0000 mg | PREFILLED_SYRINGE | SUBCUTANEOUS | Status: AC
  Administered 2024-07-16: 60 mg via SUBCUTANEOUS

## 2024-01-09 NOTE — Progress Notes (Signed)
 Patient is in office today for a nurse visit for Prolia  injection. Patient Injection was given in the  Right deltoid. Patient tolerated injection well.

## 2024-01-11 ENCOUNTER — Other Ambulatory Visit: Payer: Self-pay | Admitting: Internal Medicine

## 2024-01-12 DIAGNOSIS — M25551 Pain in right hip: Secondary | ICD-10-CM | POA: Diagnosis not present

## 2024-01-12 DIAGNOSIS — R001 Bradycardia, unspecified: Secondary | ICD-10-CM | POA: Diagnosis not present

## 2024-01-12 DIAGNOSIS — I10 Essential (primary) hypertension: Secondary | ICD-10-CM | POA: Diagnosis not present

## 2024-01-15 ENCOUNTER — Ambulatory Visit (INDEPENDENT_AMBULATORY_CARE_PROVIDER_SITE_OTHER): Payer: Medicare Other

## 2024-01-15 DIAGNOSIS — I441 Atrioventricular block, second degree: Secondary | ICD-10-CM | POA: Diagnosis not present

## 2024-01-17 LAB — CUP PACEART REMOTE DEVICE CHECK
Battery Remaining Longevity: 34 mo
Battery Voltage: 2.95 V
Brady Statistic AP VP Percent: 10.07 %
Brady Statistic AP VS Percent: 0 %
Brady Statistic AS VP Percent: 89.88 %
Brady Statistic AS VS Percent: 0.06 %
Brady Statistic RA Percent Paced: 10.05 %
Brady Statistic RV Percent Paced: 99.86 %
Date Time Interrogation Session: 20250521113342
Implantable Lead Connection Status: 753985
Implantable Lead Connection Status: 753985
Implantable Lead Implant Date: 20171219
Implantable Lead Implant Date: 20171219
Implantable Lead Location: 753859
Implantable Lead Location: 753860
Implantable Lead Model: 5076
Implantable Lead Model: 5076
Implantable Pulse Generator Implant Date: 20171219
Lead Channel Impedance Value: 1140 Ohm
Lead Channel Impedance Value: 1216 Ohm
Lead Channel Impedance Value: 285 Ohm
Lead Channel Impedance Value: 323 Ohm
Lead Channel Pacing Threshold Amplitude: 0.75 V
Lead Channel Pacing Threshold Amplitude: 1.5 V
Lead Channel Pacing Threshold Pulse Width: 0.4 ms
Lead Channel Pacing Threshold Pulse Width: 0.4 ms
Lead Channel Sensing Intrinsic Amplitude: 11.125 mV
Lead Channel Sensing Intrinsic Amplitude: 11.125 mV
Lead Channel Sensing Intrinsic Amplitude: 3 mV
Lead Channel Sensing Intrinsic Amplitude: 3 mV
Lead Channel Setting Pacing Amplitude: 2 V
Lead Channel Setting Pacing Amplitude: 3 V
Lead Channel Setting Pacing Pulse Width: 0.4 ms
Lead Channel Setting Sensing Sensitivity: 5.6 mV
Zone Setting Status: 755011
Zone Setting Status: 755011

## 2024-01-18 ENCOUNTER — Ambulatory Visit: Payer: Self-pay | Admitting: Cardiology

## 2024-01-23 DIAGNOSIS — R001 Bradycardia, unspecified: Secondary | ICD-10-CM | POA: Diagnosis not present

## 2024-01-23 DIAGNOSIS — I10 Essential (primary) hypertension: Secondary | ICD-10-CM | POA: Diagnosis not present

## 2024-01-23 DIAGNOSIS — M25551 Pain in right hip: Secondary | ICD-10-CM | POA: Diagnosis not present

## 2024-01-25 DIAGNOSIS — M25551 Pain in right hip: Secondary | ICD-10-CM | POA: Diagnosis not present

## 2024-01-25 DIAGNOSIS — I10 Essential (primary) hypertension: Secondary | ICD-10-CM | POA: Diagnosis not present

## 2024-01-25 DIAGNOSIS — R001 Bradycardia, unspecified: Secondary | ICD-10-CM | POA: Diagnosis not present

## 2024-01-30 DIAGNOSIS — M25551 Pain in right hip: Secondary | ICD-10-CM | POA: Diagnosis not present

## 2024-01-30 DIAGNOSIS — R001 Bradycardia, unspecified: Secondary | ICD-10-CM | POA: Diagnosis not present

## 2024-01-30 DIAGNOSIS — I10 Essential (primary) hypertension: Secondary | ICD-10-CM | POA: Diagnosis not present

## 2024-01-31 DIAGNOSIS — I10 Essential (primary) hypertension: Secondary | ICD-10-CM | POA: Diagnosis not present

## 2024-01-31 DIAGNOSIS — R001 Bradycardia, unspecified: Secondary | ICD-10-CM | POA: Diagnosis not present

## 2024-01-31 DIAGNOSIS — M25551 Pain in right hip: Secondary | ICD-10-CM | POA: Diagnosis not present

## 2024-02-01 DIAGNOSIS — I10 Essential (primary) hypertension: Secondary | ICD-10-CM | POA: Diagnosis not present

## 2024-02-01 DIAGNOSIS — R001 Bradycardia, unspecified: Secondary | ICD-10-CM | POA: Diagnosis not present

## 2024-02-01 DIAGNOSIS — M25551 Pain in right hip: Secondary | ICD-10-CM | POA: Diagnosis not present

## 2024-02-05 ENCOUNTER — Other Ambulatory Visit: Payer: Self-pay | Admitting: Internal Medicine

## 2024-02-06 DIAGNOSIS — R001 Bradycardia, unspecified: Secondary | ICD-10-CM | POA: Diagnosis not present

## 2024-02-06 DIAGNOSIS — I10 Essential (primary) hypertension: Secondary | ICD-10-CM | POA: Diagnosis not present

## 2024-02-06 DIAGNOSIS — M25551 Pain in right hip: Secondary | ICD-10-CM | POA: Diagnosis not present

## 2024-02-07 DIAGNOSIS — M25551 Pain in right hip: Secondary | ICD-10-CM | POA: Diagnosis not present

## 2024-02-07 DIAGNOSIS — R001 Bradycardia, unspecified: Secondary | ICD-10-CM | POA: Diagnosis not present

## 2024-02-07 DIAGNOSIS — I10 Essential (primary) hypertension: Secondary | ICD-10-CM | POA: Diagnosis not present

## 2024-02-08 DIAGNOSIS — M25551 Pain in right hip: Secondary | ICD-10-CM | POA: Diagnosis not present

## 2024-02-08 DIAGNOSIS — I10 Essential (primary) hypertension: Secondary | ICD-10-CM | POA: Diagnosis not present

## 2024-02-08 DIAGNOSIS — R001 Bradycardia, unspecified: Secondary | ICD-10-CM | POA: Diagnosis not present

## 2024-02-19 ENCOUNTER — Ambulatory Visit: Payer: Self-pay

## 2024-02-19 NOTE — Telephone Encounter (Signed)
 FYI Patient has an appointment with Dr. Charlanne on 02/20/2024 to evaluate.

## 2024-02-19 NOTE — Telephone Encounter (Signed)
 FYI Only or Action Required?: Action required by provider: update on patient condition.  Patient was last seen in primary care on 01/02/2024 by Charlanne Fredia CROME, MD. Called Nurse Triage reporting Urinary symptoms. Symptoms began yesterday. Interventions attempted: Nothing. Symptoms are: unchanged. Pt. Has burning, frequency.  Triage Disposition: See Physician Within 24 Hours  Patient/caregiver understands and will follow disposition?: Yes      Copied from CRM [id number]. Topic: Clinical - Red Word Triage >> Feb 19, 2024 10:17 AM Adrianna P wrote: Red Word that prompted transfer to Nurse Triage: Possible uti, bladder pain and urgency. Pain 5/10, she has not been drinking anything   ----------------------------------------------------------------------- From previous Reason for Contact - Scheduling: Patient/patient representative is calling to schedule an appointment. Refer to attachments for appointment information. Reason for Disposition  Urinating more frequently than usual (i.e., frequency)  Answer Assessment - Initial Assessment Questions 1. SYMPTOM: What's the main symptom you're concerned about? (e.g., frequency, incontinence)     Burning 2. ONSET: When did the    start?     yesterday 3. PAIN: Is there any pain? If Yes, ask: How bad is it? (Scale: 1-10; mild, moderate, severe)     5 4. CAUSE: What do you think is causing the symptoms?     UTI 5. OTHER SYMPTOMS: Do you have any other symptoms? (e.g., blood in urine, fever, flank pain, pain with urination)     Frequency 6. PREGNANCY: Is there any chance you are pregnant? When was your last menstrual period?     no  Protocols used: Urinary Symptoms-A-AH

## 2024-02-20 ENCOUNTER — Encounter: Payer: Self-pay | Admitting: Internal Medicine

## 2024-02-20 ENCOUNTER — Ambulatory Visit: Admitting: Internal Medicine

## 2024-02-20 VITALS — BP 140/68 | HR 73 | Temp 97.8°F | Ht 63.0 in | Wt 119.6 lb

## 2024-02-20 DIAGNOSIS — N3946 Mixed incontinence: Secondary | ICD-10-CM | POA: Diagnosis not present

## 2024-02-20 DIAGNOSIS — R300 Dysuria: Secondary | ICD-10-CM

## 2024-02-20 DIAGNOSIS — R350 Frequency of micturition: Secondary | ICD-10-CM

## 2024-02-20 MED ORDER — CIPROFLOXACIN HCL 250 MG PO TABS: 250.0000 mg | ORAL_TABLET | Freq: Two times a day (BID) | ORAL | 0 refills | Status: AC

## 2024-02-20 MED ORDER — FUROSEMIDE 20 MG PO TABS: 20.0000 mg | ORAL_TABLET | ORAL | 1 refills | Status: AC

## 2024-02-20 NOTE — Patient Instructions (Addendum)
 Start Antibiotics today with food  We will let you know what your Culture shows Restart your Vitamin D 

## 2024-02-20 NOTE — Progress Notes (Signed)
 Location:  [facility]   Place of Service:   Clinic  Provider:   Code Status: DNR Goals of Care:     02/20/2024    8:29 AM  Advanced Directives  Does Patient Have a Medical Advance Directive? Yes  Type of Estate agent of [place];Out of facility DNR (pink MOST or yellow form)  Does patient want to make changes to medical advance directive? No - Patient declined  Copy of Healthcare Power of Attorney in Chart? Yes - validated most recent copy scanned in chart (See row information)     Chief Complaint  Patient presents with   Urinary Symptoms    Frequent urination along with slight burning.    HPI: Patient is a 89 y.o. female seen today for an acute visit for Dysuria and frequency  Patient has h/o Urinary Frequency and Urgency but last Sunday she started having dysuria with her frequency She did take Azo which did not help Now just leaking  No Fever or nausea  No lower abdominal pain   Past Medical History:  Diagnosis Date   Allergy    Arthritis    Bilateral carotid bruits 03/2003   US  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 Donnald   Hemorrhoids 01/12/2023   High cholesterol    HTN (hypertension) 07/17/2015   Echo 08/2019: EF 50-55, grade 1 diastolic dysfunction, mild apical HK, normal RV SF, trivial pericardial effusion, moderate MAC, mild MR, mild TR   Hypothyroidism    Insomnia    Lactose intolerance    Lumbar degenerative disc disease    Myoview     Myoview  06/2019: EF 48, normal perfusion; low risk (EF normal by echo 10/2018)   Neuromuscular disorder (HCC)    Pronated ankles /Feet    Onychomycosis    Osteoarthritis    Osteoporosis    Second degree Mobitz II AV block    s/p PPM   Spinal stenosis    Thyroid  disease     Varicose veins     Past Surgical History:  Procedure Laterality Date   ABDOMINAL HYSTERECTOMY     BLADDER REPAIR     blephoplasty bilateral  03/10/2014   CATARACT EXTRACTION W/ INTRAOCULAR LENS  IMPLANT, BILATERAL  7 & 8 2014   CHOLECYSTECTOMY     COLONOSCOPY  05/18/2012   EP IMPLANTABLE DEVICE N/A 08/16/2016   MDT Advisa MRI conditional PPM implanted by Dr Kelsie for mobitz II second degree AV block   EYE SURGERY Right 05/02/2018   laser eye surgery   herniated disc repair     INCONTINENCE SURGERY     JOINT REPLACEMENT     LAMINECTOMY  02/2011   L3-L5  Dr. Colon   LUMBAR LAMINECTOMY/DECOMPRESSION MICRODISCECTOMY Right 09/21/2015   Procedure: Right Lumbar Three-FourMicrodiskectomy;  Surgeon: Victory Colon, MD;  Location: [facility];  Service: Neurosurgery;  Laterality: Right;  Right L3-4 Microdiskectomy   SHOULDER ARTHROSCOPY WITH ROTATOR CUFF REPAIR Right 08/2003   TONSILLECTOMY     TOTAL HIP ARTHROPLASTY     right 1999    Allergies  Allergen Reactions   Codeine Nausea Only   Macrobid [Nitrofurantoin] Diarrhea   Miralax [Polyethylene Glycol] Other (See Comments)    cramping  Other Nausea And Vomiting and Other (See Comments)   Pain Relief [Acetaminophen ] Nausea And Vomiting    Patient states she is not allergic    Paxil [Paroxetine Hcl] Nausea Only   Percodan [Oxycodone -Aspirin] Itching   Oxycodone  Nausea Only   Oxycodone -Acetaminophen  Nausea Only   Penicillins Rash    Has patient had a PCN reaction causing immediate rash, facial/tongue/throat swelling, SOB or lightheadedness with hypotension: YES Has patient had a PCN reaction causing severe rash involving mucus membranes or skin necrosis: YES Has patient had a PCN reaction that required hospitalization NO Has patient had a PCN reaction occurring within the last 10 years: NO If all of the above answers are NO, then may proceed with Cephalosporin use.    Outpatient Encounter Medications as of 02/20/2024   Medication Sig   diltiazem  (CARDIZEM  CD) 240 MG 24 hr capsule TAKE ONE CAPSULE BY MOUTH EVERY DAY   dorzolamide -timolol  (COSOPT ) 22.3-6.8 MG/ML ophthalmic solution Place 1 drop into both eyes 2 (two) times daily.   furosemide  (LASIX ) 20 MG tablet Take 1 tablet (20 mg total) by mouth every other day.   ipratropium (ATROVENT ) 0.06 % nasal spray Place 1 spray into both nostrils 3 (three) times daily.   irbesartan  (AVAPRO ) 300 MG tablet TAKE ONE TABLET BY MOUTH DAILY   LUMIGAN 0.01 % SOLN Place 1 drop into both eyes at bedtime.   metoprolol  succinate (TOPROL -XL) 25 MG 24 hr tablet TAKE 1/2 TABLET BY MOUTH AT BEDTIME   Multiple Vitamins-Minerals (MULTIVITAMIN WITH MINERALS) tablet Take 1 tablet by mouth daily.   NEOMYCIN -POLYMYXIN-HYDROCORTISONE  (CORTISPORIN) 1 % SOLN OTIC solution Place 3 drops into the right ear every 8 (eight) hours.   Netarsudil Dimesylate 0.02 % SOLN Apply 1 drop to eye at bedtime. Right eye   rosuvastatin  (CRESTOR ) 5 MG tablet TAKE ONE TABLET BY MOUTH ONCE DAILY CHOLESTEROL   SYNTHROID  125 MCG tablet TAKE ONE TABLET BY MOUTH ONCE DAILY   Vibegron  (GEMTESA ) 75 MG TABS Take 1 tablet (75 mg total) by mouth daily.   Probiotic Product (ALIGN) 4 MG CAPS Take by mouth daily. (Patient not taking: Reported on 02/20/2024)   UNABLE TO FIND Take by mouth as needed. Med Name: OTC purified peppermint oil for IBS - 3 caps before lunch and dinner (Patient not taking: Reported on 02/20/2024)   Facility-Administered Encounter Medications as of 02/20/2024  Medication   [START ON 07/16/2024] denosumab  (PROLIA ) injection 60 mg    Review of Systems:  Review of Systems  Constitutional:  Negative for activity change and appetite change.  HENT: Negative.    Respiratory:  Negative for cough and shortness of breath.   Cardiovascular:  Negative for leg swelling.  Gastrointestinal:  Negative for constipation.  Genitourinary:  Positive for dysuria, frequency and urgency.  Musculoskeletal:  Positive  for gait problem. Negative for arthralgias and myalgias.  Skin: Negative.   Neurological:  Negative for dizziness and weakness.  Psychiatric/Behavioral:  Negative for confusion, dysphoric mood and sleep disturbance.     Health Maintenance  Topic Date Due   Pneumococcal Vaccine: 50+ Years (2 of 2 - PCV) 07/15/2015   COVID-19 Vaccine (7 - 2024-25 season) 06/28/2024 (Originally 08/15/2023)   Medicare Annual Wellness (AWV)  08/28/2024 (Originally 09/20/2023)   INFLUENZA VACCINE  03/29/2024   DTaP/Tdap/Td (5 - Td or Tdap) 05/10/2031   DEXA SCAN  Completed   Zoster Vaccines- Shingrix  Completed   Hepatitis B Vaccines  Aged Out   HPV VACCINES  Aged Out   Meningococcal B  Vaccine  Aged Out    Physical Exam: Vitals:   02/19/24 1544 02/20/24 0831  BP: (!) 140/72 (!) 140/68  Pulse: 73   Temp: 97.8 F (36.6 C)   SpO2: 95%   Weight: 119 lb 9.6 oz (54.3 kg)   Height: 5' 3 (1.6 m)    Body mass index is 21.19 kg/m. Physical Exam Vitals reviewed.  Constitutional:      Appearance: Normal appearance.  HENT:     Head: Normocephalic.     Nose: Nose normal.     Mouth/Throat:     Mouth: Mucous membranes are moist.     Pharynx: Oropharynx is clear.   Eyes:     Pupils: Pupils are equal, round, and reactive to light.    Cardiovascular:     Rate and Rhythm: Normal rate and regular rhythm.     Pulses: Normal pulses.     Heart sounds: Normal heart sounds. No murmur heard. Pulmonary:     Effort: Pulmonary effort is normal.     Breath sounds: Normal breath sounds.  Abdominal:     General: Abdomen is flat. Bowel sounds are normal.     Palpations: Abdomen is soft.   Musculoskeletal:        General: No swelling.     Cervical back: Neck supple.   Skin:    General: Skin is warm.   Neurological:     General: No focal deficit present.     Mental Status: She is alert and oriented to person, place, and time.   Psychiatric:        Mood and Affect: Mood normal.        Thought Content:  Thought content normal.     Labs reviewed: Basic Metabolic Panel: Recent Labs    02/21/23 0000 07/11/23 0000 01/04/24 0000  NA 141 141 142  K 4.2 4.2 4.2  CL 106 106 107  CO2 23* 23* 23*  BUN 15 13 14   CREATININE 0.5 0.6 0.6  CALCIUM  9.7 9.9 10.1   Liver Function Tests: Recent Labs    01/04/24 0000  AST 33  ALT 27  ALKPHOS 70  ALBUMIN 4.5   No results for input(s): LIPASE, AMYLASE in the last 8760 hours. No results for input(s): AMMONIA in the last 8760 hours. CBC: Recent Labs    07/11/23 0000 01/04/24 0000  WBC 6.5 5.7  HGB 13.5 13.3  HCT 40 41  PLT 209 219   Lipid Panel: Recent Labs    01/04/24 0000  CHOL 165  HDL 81*  LDLCALC 68  TRIG 79   Lab Results  Component Value Date   HGBA1C 5.8 10/12/2021    Procedures since last visit: No results found.  Assessment/Plan 1. Dysuria (Primary) Patient is allergic to Penicillin and Macrodantin Has responded to Cipro  before Will start Cipro  250 BID for 7 days  While Awaiting culture   2. Urinary frequency She is on Gemtesa  She will call Uro gynecology to follow up if symptoms persists   3. Mixed stress and urge urinary incontinence Same as above  4 Blood in urine Patient had UA checked in 05/25 Positive for few RBC Will follow with Repeat UA in few weeks 5 Vit D def Level was 33 Told her to restart her supplement    Labs/tests ordered:  * No order type specified * Next appt:  05/06/2024

## 2024-02-21 DIAGNOSIS — R300 Dysuria: Secondary | ICD-10-CM | POA: Diagnosis not present

## 2024-03-04 ENCOUNTER — Encounter: Payer: Self-pay | Admitting: Internal Medicine

## 2024-03-05 NOTE — Addendum Note (Signed)
 Addended by: TAWNI DRILLING D on: 03/05/2024 01:16 PM   Modules accepted: Orders

## 2024-03-05 NOTE — Progress Notes (Signed)
 Remote pacemaker transmission.

## 2024-04-15 ENCOUNTER — Ambulatory Visit (INDEPENDENT_AMBULATORY_CARE_PROVIDER_SITE_OTHER): Payer: Medicare Other

## 2024-04-15 DIAGNOSIS — I441 Atrioventricular block, second degree: Secondary | ICD-10-CM | POA: Diagnosis not present

## 2024-04-17 LAB — CUP PACEART REMOTE DEVICE CHECK
Battery Remaining Longevity: 29 mo
Battery Voltage: 2.94 V
Brady Statistic AP VP Percent: 13.08 %
Brady Statistic AP VS Percent: 0 %
Brady Statistic AS VP Percent: 86.87 %
Brady Statistic AS VS Percent: 0.05 %
Brady Statistic RA Percent Paced: 13.07 %
Brady Statistic RV Percent Paced: 99.88 %
Date Time Interrogation Session: 20250819104525
Implantable Lead Connection Status: 753985
Implantable Lead Connection Status: 753985
Implantable Lead Implant Date: 20171219
Implantable Lead Implant Date: 20171219
Implantable Lead Location: 753859
Implantable Lead Location: 753860
Implantable Lead Model: 5076
Implantable Lead Model: 5076
Implantable Pulse Generator Implant Date: 20171219
Lead Channel Impedance Value: 1140 Ohm
Lead Channel Impedance Value: 1216 Ohm
Lead Channel Impedance Value: 285 Ohm
Lead Channel Impedance Value: 342 Ohm
Lead Channel Pacing Threshold Amplitude: 0.625 V
Lead Channel Pacing Threshold Amplitude: 1.25 V
Lead Channel Pacing Threshold Pulse Width: 0.4 ms
Lead Channel Pacing Threshold Pulse Width: 0.4 ms
Lead Channel Sensing Intrinsic Amplitude: 13 mV
Lead Channel Sensing Intrinsic Amplitude: 13 mV
Lead Channel Sensing Intrinsic Amplitude: 2.25 mV
Lead Channel Sensing Intrinsic Amplitude: 2.25 mV
Lead Channel Setting Pacing Amplitude: 2 V
Lead Channel Setting Pacing Amplitude: 3.25 V
Lead Channel Setting Pacing Pulse Width: 0.4 ms
Lead Channel Setting Sensing Sensitivity: 5.6 mV
Zone Setting Status: 755011
Zone Setting Status: 755011

## 2024-04-19 ENCOUNTER — Ambulatory Visit: Payer: Self-pay | Admitting: Cardiology

## 2024-04-23 ENCOUNTER — Other Ambulatory Visit: Payer: Self-pay | Admitting: Family Medicine

## 2024-04-23 ENCOUNTER — Other Ambulatory Visit: Payer: Self-pay | Admitting: Internal Medicine

## 2024-04-24 DIAGNOSIS — H401131 Primary open-angle glaucoma, bilateral, mild stage: Secondary | ICD-10-CM | POA: Diagnosis not present

## 2024-05-05 NOTE — Patient Instructions (Incomplete)
 Recommend Prevnar 20 (updated pneumonia vaccine)  Try prednisone  for your right thumb swelling, if not improving in 2 days notify me.

## 2024-05-05 NOTE — Progress Notes (Signed)
 Location:  [facility]  POS: Clinic  Provider: Tawni America, ANP  Code Status: DNR Goals of Care:     02/20/2024    8:29 AM  Advanced Directives  Does Patient Have a Medical Advance Directive? Yes  Type of Estate agent of [place];Out of facility DNR (pink MOST or yellow form)  Does patient want to make changes to medical advance directive? No - Patient declined  Copy of Healthcare Power of Attorney in Chart? Yes - validated most recent copy scanned in chart (See row information)     No chief complaint on file.   HPI: Patient is a 89 y.o. female seen today for medical management of chronic diseases.    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   Reports bilateral shoulder pain. Has has right shoulder arthroscopy in the past. Left is worsening. Hurts more at night than during the day. Uses tylenol  occasionally.   Left shoulder has bone spurs with some pain used to take injections with Dr Melita but doesn't go anymore as the treatment was no longer effective.    Continues to drive short distances. Manages house independnently  OP: Continues on prolia  T score -1.0 in 2019, prior hx of compression fracture.   Mammogram aged out  Taking gemtesa  for OAB followed by urogyn  HLD on Crestor   LDL 68  01/04/24  Hypothyroidism: TSH 1.31 01/04/24 Venous insuff: takes lasix  qod, sometimes skips due to frequency. Wears compression hose. Has seen vascular surgery, opted not to tx  Constipation:   VIt D level 33 01/04/24 restarted supplement  Foot pain and instability, extended to hip and knee. 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 Donnald   Hemorrhoids 01/12/2023   High cholesterol    HTN (hypertension) 07/17/2015   Echo 08/2019: EF 50-55, grade 1 diastolic dysfunction, mild apical HK, normal RV SF, trivial pericardial effusion, moderate MAC, mild MR, mild TR   Hypothyroidism    Insomnia    Lactose intolerance    Lumbar degenerative disc disease    Myoview     Myoview  06/2019: EF 48, normal perfusion; low risk (EF normal by echo 10/2018)   Neuromuscular disorder (HCC)    Pronated ankles /Feet    Onychomycosis    Osteoarthritis    Osteoporosis    Second degree Mobitz II AV block    s/p PPM   Spinal stenosis    Thyroid  disease    Varicose veins     Past Surgical History:  Procedure Laterality Date   ABDOMINAL HYSTERECTOMY     BLADDER REPAIR     blephoplasty bilateral  03/10/2014   CATARACT EXTRACTION W/ INTRAOCULAR LENS  IMPLANT, BILATERAL  7 & 8 2014   CHOLECYSTECTOMY     COLONOSCOPY  05/18/2012   EP IMPLANTABLE DEVICE N/A 08/16/2016   MDT Advisa MRI conditional PPM implanted by Dr Kelsie for mobitz II second degree AV block   EYE  SURGERY Right 05/02/2018   laser eye surgery   herniated disc repair     INCONTINENCE SURGERY     JOINT REPLACEMENT     LAMINECTOMY  02/2011   L3-L5  Dr. Colon   LUMBAR LAMINECTOMY/DECOMPRESSION MICRODISCECTOMY Right 09/21/2015   Procedure: Right Lumbar Three-FourMicrodiskectomy;  Surgeon: Victory Colon, MD;  Location: [facility];  Service: Neurosurgery;  Laterality: Right;  Right L3-4 Microdiskectomy   SHOULDER ARTHROSCOPY WITH ROTATOR CUFF REPAIR Right 08/2003   TONSILLECTOMY     TOTAL HIP ARTHROPLASTY     right 1999    Allergies  Allergen Reactions   Codeine Nausea Only   Macrobid [Nitrofurantoin] Diarrhea   Miralax [Polyethylene Glycol] Other (See Comments)    cramping   Other Nausea And Vomiting and Other  (See Comments)   Pain Relief [Acetaminophen ] Nausea And Vomiting    Patient states she is not allergic    Paxil [Paroxetine Hcl] Nausea Only   Percodan [Oxycodone -Aspirin] Itching   Oxycodone  Nausea Only   Oxycodone -Acetaminophen  Nausea Only   Penicillins Rash    Has patient had a PCN reaction causing immediate rash, facial/tongue/throat swelling, SOB or lightheadedness with hypotension: YES Has patient had a PCN reaction causing severe rash involving mucus membranes or skin necrosis: YES Has patient had a PCN reaction that required hospitalization NO Has patient had a PCN reaction occurring within the last 10 years: NO If all of the above answers are NO, then may proceed with Cephalosporin use.    Outpatient Encounter Medications as of 05/06/2024  Medication Sig   CARTIA  XT 240 MG 24 hr capsule TAKE ONE CAPSULE BY MOUTH EVERY DAY   dorzolamide -timolol  (COSOPT ) 22.3-6.8 MG/ML ophthalmic solution Place 1 drop into both eyes 2 (two) times daily.   furosemide  (LASIX ) 20 MG tablet Take 1 tablet (20 mg total) by mouth every other day.   ipratropium (ATROVENT ) 0.06 % nasal spray Place 1 spray into both nostrils 3 (three) times daily.   irbesartan  (AVAPRO ) 300 MG tablet TAKE ONE TABLET BY MOUTH DAILY   LUMIGAN 0.01 % SOLN Place 1 drop into both eyes at bedtime.   metoprolol  succinate (TOPROL -XL) 25 MG 24 hr tablet TAKE 1/2 TABLET BY MOUTH AT BEDTIME   Multiple Vitamins-Minerals (MULTIVITAMIN WITH MINERALS) tablet Take 1 tablet by mouth daily.   NEOMYCIN -POLYMYXIN-HYDROCORTISONE  (CORTISPORIN) 1 % SOLN OTIC solution Place 3 drops into the right ear every 8 (eight) hours.   Netarsudil Dimesylate 0.02 % SOLN Apply 1 drop to eye at bedtime. Right eye   Probiotic Product (ALIGN) 4 MG CAPS Take by mouth daily. (Patient not taking: Reported on 02/20/2024)   rosuvastatin  (CRESTOR ) 5 MG tablet TAKE ONE TABLET BY MOUTH ONCE DAILY CHOLESTEROL   SYNTHROID  125 MCG tablet TAKE ONE TABLET BY MOUTH ONCE DAILY    UNABLE TO FIND Take by mouth as needed. Med Name: OTC purified peppermint oil for IBS - 3 caps before lunch and dinner (Patient not taking: Reported on 02/20/2024)   Vibegron  (GEMTESA ) 75 MG TABS Take 1 tablet (75 mg total) by mouth daily.   Facility-Administered Encounter Medications as of 05/06/2024  Medication   [START ON 07/16/2024] denosumab  (PROLIA ) injection 60 mg    Review of Systems:  Review of Systems  Constitutional:  Negative for activity change, appetite change, chills, diaphoresis, fatigue, fever and unexpected weight change.  HENT:  Negative for congestion.   Respiratory:  Negative for cough, shortness of breath and wheezing.   Cardiovascular:  Positive for leg swelling. Negative for chest  pain and palpitations.  Gastrointestinal:  Positive for constipation. Negative for abdominal distention, abdominal pain and diarrhea.  Genitourinary:  Negative for difficulty urinating and dysuria.  Musculoskeletal:  Positive for gait problem. Negative for arthralgias, back pain, joint swelling, myalgias and neck pain.  Neurological:  Negative for dizziness, tremors, seizures, syncope, facial asymmetry, speech difficulty, weakness, light-headedness, numbness and headaches.  Psychiatric/Behavioral:  Negative for agitation, behavioral problems and confusion.     Health Maintenance  Topic Date Due   Pneumococcal Vaccine: 50+ Years (2 of 2 - PCV) 07/15/2015   Influenza Vaccine  03/29/2024   COVID-19 Vaccine (7 - 2025-26 season) 04/29/2024   Medicare Annual Wellness (AWV)  08/28/2024 (Originally 09/20/2023)   DTaP/Tdap/Td (5 - Td or Tdap) 05/10/2031   DEXA SCAN  Completed   Zoster Vaccines- Shingrix  Completed   HPV VACCINES  Aged Out   Meningococcal B Vaccine  Aged Out    Physical Exam: There were no vitals filed for this visit.  Wt Readings from Last 3 Encounters:  02/19/24 119 lb 9.6 oz (54.3 kg)  01/02/24 122 lb 6.4 oz (55.5 kg)  07/03/23 122 lb 12.8 oz (55.7 kg)    There is no  height or weight on file to calculate BMI. Physical Exam Vitals and nursing note reviewed.  Constitutional:      General: She is not in acute distress.    Appearance: She is not diaphoretic.  HENT:     Head: Normocephalic and atraumatic.     Right Ear: Tympanic membrane normal.     Left Ear: Tympanic membrane normal.     Nose: Nose normal.     Mouth/Throat:     Mouth: Mucous membranes are moist.     Pharynx: Oropharynx is clear.  Eyes:     Conjunctiva/sclera: Conjunctivae normal.     Pupils: Pupils are equal, round, and reactive to light.  Neck:     Vascular: No JVD.  Cardiovascular:     Rate and Rhythm: Normal rate and regular rhythm.     Heart sounds: No murmur heard. Pulmonary:     Effort: Pulmonary effort is normal. No respiratory distress.     Breath sounds: Normal breath sounds. No wheezing.  Abdominal:     General: Bowel sounds are normal. There is no distension.     Palpations: Abdomen is soft.     Tenderness: There is no abdominal tenderness.  Musculoskeletal:     Comments: BLE edema +2 on right +1 left Varicose veins R>L  Skin:    General: Skin is warm and dry.  Neurological:     Mental Status: She is alert and oriented to person, place, and time.  Psychiatric:        Mood and Affect: Mood normal.     Labs reviewed: Basic Metabolic Panel: Recent Labs    07/11/23 0000 01/04/24 0000  NA 141 142  K 4.2 4.2  CL 106 107  CO2 23* 23*  BUN 13 14  CREATININE 0.6 0.6  CALCIUM  9.9 10.1   Liver Function Tests: Recent Labs    01/04/24 0000  AST 33  ALT 27  ALKPHOS 70  ALBUMIN 4.5   No results for input(s): LIPASE, AMYLASE in the last 8760 hours. No results for input(s): AMMONIA in the last 8760 hours. CBC: Recent Labs    07/11/23 0000 01/04/24 0000  WBC 6.5 5.7  HGB 13.5 13.3  HCT 40 41  PLT 209 219   Lipid Panel: Recent Labs  01/04/24 0000  CHOL 165  HDL 81*  LDLCALC 68  TRIG 79   Lab Results  Component Value Date   HGBA1C  5.8 10/12/2021    Procedures since last visit: CUP PACEART REMOTE DEVICE CHECK Result Date: 04/17/2024 PPM Scheduled remote reviewed. Normal device function.  Presenting rhythm: AP/VP Next remote 91 days. LA, CVRS   Assessment/Plan  1. Venous insufficiency of both lower extremities Chronic in both legs R>L Prior doppler of RLE negative for DVT Continue lasix  and compression hose   2. Hypothyroidism due to acquired atrophy of thyroid  Continue Synthroid    3. Other hyperlipidemia Continue crestor    4. Chronic pain of both shoulders Recommend using tylenol  Could use   5. Overactive bladder Continue Gemtesa    6. Non-seasonal allergic rhinitis due to other allergic trigger Continue Atrovent  nasal spray   7. Second degree Mobitz II AV block S/p pacemaker  8. Senile osteoporosis On Prolia  dose given 01/09/24 Will need labs in Nov.    Labs/tests ordered:  * No order type specified * Order labs for Nov BMP CBC Next appt:  6 months with Dr Charlanne   Total time :  time greater than 50% of total time spent doing pt counseling and coordination of care      Recommend Prevnar 20

## 2024-05-06 ENCOUNTER — Non-Acute Institutional Stay: Admitting: Adult Health

## 2024-05-06 ENCOUNTER — Encounter: Payer: Self-pay | Admitting: Adult Health

## 2024-05-06 VITALS — BP 136/58 | HR 86 | Temp 98.0°F | Ht 63.0 in | Wt 119.8 lb

## 2024-05-06 DIAGNOSIS — I872 Venous insufficiency (chronic) (peripheral): Secondary | ICD-10-CM | POA: Diagnosis not present

## 2024-05-06 DIAGNOSIS — R2231 Localized swelling, mass and lump, right upper limb: Secondary | ICD-10-CM

## 2024-05-06 DIAGNOSIS — E034 Atrophy of thyroid (acquired): Secondary | ICD-10-CM | POA: Diagnosis not present

## 2024-05-06 DIAGNOSIS — I10 Essential (primary) hypertension: Secondary | ICD-10-CM

## 2024-05-06 DIAGNOSIS — Z950 Presence of cardiac pacemaker: Secondary | ICD-10-CM

## 2024-05-06 DIAGNOSIS — M810 Age-related osteoporosis without current pathological fracture: Secondary | ICD-10-CM

## 2024-05-06 DIAGNOSIS — N3281 Overactive bladder: Secondary | ICD-10-CM

## 2024-05-06 DIAGNOSIS — E7849 Other hyperlipidemia: Secondary | ICD-10-CM | POA: Diagnosis not present

## 2024-05-06 MED ORDER — MIRABEGRON ER 25 MG PO TB24
25.0000 mg | ORAL_TABLET | Freq: Every day | ORAL | 2 refills | Status: AC
  Filled 2024-06-03: qty 30, 30d supply, fill #0

## 2024-05-06 MED ORDER — PREDNISONE 20 MG PO TABS: 20.0000 mg | ORAL_TABLET | Freq: Every day | ORAL | 0 refills | Status: AC

## 2024-05-22 NOTE — Progress Notes (Signed)
 Remote PPM Transmission

## 2024-06-03 ENCOUNTER — Other Ambulatory Visit: Payer: Self-pay

## 2024-06-03 ENCOUNTER — Other Ambulatory Visit (HOSPITAL_COMMUNITY): Payer: Self-pay

## 2024-06-14 ENCOUNTER — Other Ambulatory Visit (HOSPITAL_COMMUNITY): Payer: Self-pay

## 2024-06-21 ENCOUNTER — Telehealth: Payer: Self-pay | Admitting: *Deleted

## 2024-06-21 NOTE — Telephone Encounter (Signed)
 [employer] Verification submitted for Prolia .  Rx was sent to Pharmacy but insurance will not cover through Pharmacy benefits.

## 2024-07-04 DIAGNOSIS — I872 Venous insufficiency (chronic) (peripheral): Secondary | ICD-10-CM | POA: Diagnosis not present

## 2024-07-04 DIAGNOSIS — Z85828 Personal history of other malignant neoplasm of skin: Secondary | ICD-10-CM | POA: Diagnosis not present

## 2024-07-04 DIAGNOSIS — R609 Edema, unspecified: Secondary | ICD-10-CM | POA: Diagnosis not present

## 2024-07-04 DIAGNOSIS — D225 Melanocytic nevi of trunk: Secondary | ICD-10-CM | POA: Diagnosis not present

## 2024-07-04 DIAGNOSIS — L821 Other seborrheic keratosis: Secondary | ICD-10-CM | POA: Diagnosis not present

## 2024-07-04 DIAGNOSIS — L578 Other skin changes due to chronic exposure to nonionizing radiation: Secondary | ICD-10-CM | POA: Diagnosis not present

## 2024-07-04 DIAGNOSIS — D2230 Melanocytic nevi of unspecified part of face: Secondary | ICD-10-CM | POA: Diagnosis not present

## 2024-07-08 NOTE — Telephone Encounter (Signed)
 Received [employer] Verification and No Copay and No PA.   (Ran Pharmacy Benefits through [employer] and Copay Prolia  [phone] and Copay for [employer] [phone])

## 2024-07-09 DIAGNOSIS — M810 Age-related osteoporosis without current pathological fracture: Secondary | ICD-10-CM | POA: Diagnosis not present

## 2024-07-09 DIAGNOSIS — I10 Essential (primary) hypertension: Secondary | ICD-10-CM | POA: Diagnosis not present

## 2024-07-09 LAB — CBC AND DIFFERENTIAL
HCT: 40 (ref 36–46)
Hemoglobin: 13.1 (ref 12.0–16.0)
Platelets: 233 K/uL (ref 150–400)
WBC: 6.9

## 2024-07-09 LAB — COMPREHENSIVE METABOLIC PANEL WITH GFR
Calcium: 10.1 (ref 8.7–10.7)
eGFR: 85

## 2024-07-09 LAB — BASIC METABOLIC PANEL WITH GFR
BUN: 12 (ref 4–21)
CO2: 25 — AB (ref 13–22)
Chloride: 107 (ref 99–108)
Creatinine: 0.6 (ref 0.5–1.1)
Glucose: 108
Potassium: 3.9 meq/L (ref 3.5–5.1)
Sodium: 144 (ref 137–147)

## 2024-07-09 LAB — CBC: RBC: 4.27 (ref 3.87–5.11)

## 2024-07-15 ENCOUNTER — Ambulatory Visit: Payer: Medicare Other

## 2024-07-15 DIAGNOSIS — I441 Atrioventricular block, second degree: Secondary | ICD-10-CM | POA: Diagnosis not present

## 2024-07-15 LAB — CUP PACEART REMOTE DEVICE CHECK
Battery Remaining Longevity: 28 mo
Battery Voltage: 2.94 V
Brady Statistic AP VP Percent: 22.72 %
Brady Statistic AP VS Percent: 0 %
Brady Statistic AS VP Percent: 77.22 %
Brady Statistic AS VS Percent: 0.06 %
Brady Statistic RA Percent Paced: 22.68 %
Brady Statistic RV Percent Paced: 99.82 %
Date Time Interrogation Session: 20251117150817
Implantable Lead Connection Status: 753985
Implantable Lead Connection Status: 753985
Implantable Lead Implant Date: 20171219
Implantable Lead Implant Date: 20171219
Implantable Lead Location: 753859
Implantable Lead Location: 753860
Implantable Lead Model: 5076
Implantable Lead Model: 5076
Implantable Pulse Generator Implant Date: 20171219
Lead Channel Impedance Value: 1178 Ohm
Lead Channel Impedance Value: 1254 Ohm
Lead Channel Impedance Value: 285 Ohm
Lead Channel Impedance Value: 342 Ohm
Lead Channel Pacing Threshold Amplitude: 0.625 V
Lead Channel Pacing Threshold Amplitude: 1.5 V
Lead Channel Pacing Threshold Pulse Width: 0.4 ms
Lead Channel Pacing Threshold Pulse Width: 0.4 ms
Lead Channel Sensing Intrinsic Amplitude: 12.75 mV
Lead Channel Sensing Intrinsic Amplitude: 12.75 mV
Lead Channel Sensing Intrinsic Amplitude: 2.875 mV
Lead Channel Sensing Intrinsic Amplitude: 2.875 mV
Lead Channel Setting Pacing Amplitude: 2 V
Lead Channel Setting Pacing Amplitude: 3 V
Lead Channel Setting Pacing Pulse Width: 0.4 ms
Lead Channel Setting Sensing Sensitivity: 5.6 mV
Zone Setting Status: 755011
Zone Setting Status: 755011

## 2024-07-16 ENCOUNTER — Ambulatory Visit: Payer: Self-pay | Admitting: Cardiology

## 2024-07-16 ENCOUNTER — Ambulatory Visit: Payer: Self-pay | Admitting: Adult Health

## 2024-07-16 DIAGNOSIS — M810 Age-related osteoporosis without current pathological fracture: Secondary | ICD-10-CM

## 2024-07-16 MED ORDER — DENOSUMAB 60 MG/ML ~~LOC~~ SOSY: 60.0000 mg | PREFILLED_SYRINGE | SUBCUTANEOUS | Status: AC

## 2024-07-16 NOTE — Addendum Note (Signed)
 Addended by: LYNNANN COUGH A on: 07/16/2024 01:45 PM   Modules accepted: Orders

## 2024-07-16 NOTE — Progress Notes (Signed)
 Patient is in office today for a nurse visit for  Prolia Injection . Patient Injection was given in the  Right arm. Patient tolerated injection well.

## 2024-07-17 NOTE — Progress Notes (Signed)
 Remote PPM Transmission

## 2024-08-02 ENCOUNTER — Other Ambulatory Visit: Payer: Self-pay | Admitting: Internal Medicine

## 2024-08-04 NOTE — Progress Notes (Unsigned)
  Electrophysiology Office Follow up Visit Note:    Date:  08/05/2024   ID:  Destiny [name], DOB [birth year], MRN [id number]  PCP:  Charlanne Fredia CROME, MD  [facility] Cardiologist:  None  [facility] Electrophysiologist:  Lynwood Rakers, MD (Inactive)    Interval History:     Destiny [name] is a 89 y.o. female who presents for a follow up visit.   The patient was last seen by [facility] April 12, 2023.  She has a history of symptomatic bradycardia with a permanent pacemaker in place.  At the last appointment with [facility] she was not doing remote monitoring.  She is doing well.  No complaints today.      Past medical, surgical, social and family history were reviewed.  ROS:   Please see the history of present illness.    All other systems reviewed and are negative.  EKGs/Labs/Other Studies Reviewed:    The following studies were reviewed today:  August 05, 2024 in-clinic device interrogation personally reviewed Battery and lead parameter stable.        Physical Exam:    VS:  BP (!) 162/80 (BP Location: Left Arm, Patient Position: Sitting, Cuff Size: Normal)   Pulse 68   Ht 5' 3 (1.6 m)   Wt 120 lb (54.4 kg)   SpO2 94%   BMI 21.26 kg/m     Manual recheck of the blood pressure 146/60 Wt Readings from Last 3 Encounters:  08/05/24 120 lb (54.4 kg)  05/06/24 119 lb 12.8 oz (54.3 kg)  02/19/24 119 lb 9.6 oz (54.3 kg)     GEN: no distress CARD: RRR, No MRG.  Generator pocket well-healed RESP: No IWOB. CTAB.      ASSESSMENT:    1. Cardiac pacemaker in situ   2. Second degree Mobitz II AV block   3. Essential hypertension    PLAN:    In order of problems listed above:  #Permanent pacemaker in situ Device functioning appropriately.  Continue remote monitoring.  #Hypertension Above goal today.  Recommend checking blood pressures 1-2 times per week at home and recording the values.  Recommend bringing these recordings to the primary care physician.  I  discussed my upcoming departure from [facility] during today's clinic appointment.  The patient will continue to follow-up with one of my EP partners moving forward.  Follow-up with APP in 1 year   Signed, Ole Holts, MD, [facility], [facility] 08/05/2024 2:14 PM    Electrophysiology [facility]

## 2024-08-05 ENCOUNTER — Encounter: Payer: Self-pay | Admitting: Cardiology

## 2024-08-05 ENCOUNTER — Ambulatory Visit: Attending: Cardiology | Admitting: Cardiology

## 2024-08-05 VITALS — BP 162/80 | HR 68 | Ht 63.0 in | Wt 120.0 lb

## 2024-08-05 DIAGNOSIS — Z950 Presence of cardiac pacemaker: Secondary | ICD-10-CM

## 2024-08-05 DIAGNOSIS — I10 Essential (primary) hypertension: Secondary | ICD-10-CM

## 2024-08-05 DIAGNOSIS — I441 Atrioventricular block, second degree: Secondary | ICD-10-CM | POA: Diagnosis present

## 2024-08-05 LAB — CUP PACEART INCLINIC DEVICE CHECK
Date Time Interrogation Session: 20251208145703
Implantable Lead Connection Status: 753985
Implantable Lead Connection Status: 753985
Implantable Lead Implant Date: 20171219
Implantable Lead Implant Date: 20171219
Implantable Lead Location: 753859
Implantable Lead Location: 753860
Implantable Lead Model: 5076
Implantable Lead Model: 5076
Implantable Pulse Generator Implant Date: 20171219

## 2024-08-05 NOTE — Patient Instructions (Signed)

## 2024-08-06 ENCOUNTER — Ambulatory Visit: Payer: Self-pay | Admitting: Cardiology

## 2024-08-26 ENCOUNTER — Other Ambulatory Visit: Payer: Self-pay | Admitting: Internal Medicine

## 2024-09-03 ENCOUNTER — Encounter: Payer: Self-pay | Admitting: Internal Medicine

## 2025-01-14 ENCOUNTER — Ambulatory Visit
